# Patient Record
Sex: Female | Born: 1945
Health system: Southern US, Community
[De-identification: ages and names within clinical notes are randomized; demographics above are authoritative.]

## PROBLEM LIST (undated history)

## (undated) DIAGNOSIS — E7800 Pure hypercholesterolemia, unspecified: Secondary | ICD-10-CM

## (undated) DIAGNOSIS — B019 Varicella without complication: Secondary | ICD-10-CM

## (undated) DIAGNOSIS — T7840XA Allergy, unspecified, initial encounter: Secondary | ICD-10-CM

## (undated) DIAGNOSIS — R112 Nausea with vomiting, unspecified: Secondary | ICD-10-CM

## (undated) DIAGNOSIS — M1990 Unspecified osteoarthritis, unspecified site: Secondary | ICD-10-CM

## (undated) DIAGNOSIS — E559 Vitamin D deficiency, unspecified: Secondary | ICD-10-CM

## (undated) DIAGNOSIS — Z923 Personal history of irradiation: Secondary | ICD-10-CM

## (undated) DIAGNOSIS — Z9889 Other specified postprocedural states: Secondary | ICD-10-CM

## (undated) DIAGNOSIS — C50919 Malignant neoplasm of unspecified site of unspecified female breast: Secondary | ICD-10-CM

## (undated) DIAGNOSIS — Z9221 Personal history of antineoplastic chemotherapy: Secondary | ICD-10-CM

## (undated) HISTORY — PX: SPINE SURGERY: SHX786

## (undated) HISTORY — DX: Pure hypercholesterolemia, unspecified: E78.00

## (undated) HISTORY — PX: BACK SURGERY: SHX140

## (undated) HISTORY — DX: Malignant neoplasm of unspecified site of unspecified female breast: C50.919

## (undated) HISTORY — DX: Unspecified osteoarthritis, unspecified site: M19.90

## (undated) HISTORY — DX: Allergy, unspecified, initial encounter: T78.40XA

## (undated) HISTORY — DX: Varicella without complication: B01.9

## (undated) HISTORY — PX: BREAST SURGERY: SHX581

## (undated) HISTORY — DX: Vitamin D deficiency, unspecified: E55.9

---
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
## 1998-07-26 ENCOUNTER — Encounter: Payer: Self-pay | Admitting: Obstetrics and Gynecology

## 1998-07-26 ENCOUNTER — Ambulatory Visit (HOSPITAL_COMMUNITY): Admission: RE | Admit: 1998-07-26 | Discharge: 1998-07-26 | Payer: Self-pay | Admitting: Obstetrics and Gynecology

## 1998-11-22 ENCOUNTER — Other Ambulatory Visit: Admission: RE | Admit: 1998-11-22 | Discharge: 1998-11-22 | Payer: Self-pay | Admitting: Obstetrics and Gynecology

## 2000-01-09 ENCOUNTER — Encounter: Payer: Self-pay | Admitting: Obstetrics and Gynecology

## 2000-01-09 ENCOUNTER — Ambulatory Visit (HOSPITAL_COMMUNITY): Admission: RE | Admit: 2000-01-09 | Discharge: 2000-01-09 | Payer: Self-pay | Admitting: Obstetrics and Gynecology

## 2000-01-29 ENCOUNTER — Other Ambulatory Visit: Admission: RE | Admit: 2000-01-29 | Discharge: 2000-01-29 | Payer: Self-pay | Admitting: Obstetrics and Gynecology

## 2000-02-06 ENCOUNTER — Encounter: Payer: Self-pay | Admitting: Obstetrics and Gynecology

## 2000-02-06 ENCOUNTER — Encounter: Admission: RE | Admit: 2000-02-06 | Discharge: 2000-02-06 | Payer: Self-pay | Admitting: Obstetrics and Gynecology

## 2000-07-02 ENCOUNTER — Encounter: Payer: Self-pay | Admitting: Obstetrics and Gynecology

## 2000-07-02 ENCOUNTER — Encounter: Admission: RE | Admit: 2000-07-02 | Discharge: 2000-07-02 | Payer: Self-pay | Admitting: Obstetrics and Gynecology

## 2001-02-25 ENCOUNTER — Encounter: Admission: RE | Admit: 2001-02-25 | Discharge: 2001-02-25 | Payer: Self-pay | Admitting: Obstetrics and Gynecology

## 2001-02-25 ENCOUNTER — Encounter: Payer: Self-pay | Admitting: Obstetrics and Gynecology

## 2001-08-19 ENCOUNTER — Encounter: Admission: RE | Admit: 2001-08-19 | Discharge: 2001-08-19 | Payer: Self-pay | Admitting: *Deleted

## 2001-08-19 ENCOUNTER — Encounter: Payer: Self-pay | Admitting: *Deleted

## 2001-09-02 ENCOUNTER — Other Ambulatory Visit: Admission: RE | Admit: 2001-09-02 | Discharge: 2001-09-02 | Payer: Self-pay | Admitting: *Deleted

## 2002-03-03 ENCOUNTER — Encounter: Payer: Self-pay | Admitting: *Deleted

## 2002-03-03 ENCOUNTER — Encounter: Admission: RE | Admit: 2002-03-03 | Discharge: 2002-03-03 | Payer: Self-pay | Admitting: *Deleted

## 2002-09-22 ENCOUNTER — Other Ambulatory Visit: Admission: RE | Admit: 2002-09-22 | Discharge: 2002-09-22 | Payer: Self-pay | Admitting: *Deleted

## 2003-04-13 ENCOUNTER — Encounter: Admission: RE | Admit: 2003-04-13 | Discharge: 2003-04-13 | Payer: Self-pay | Admitting: Maternal and Fetal Medicine

## 2003-11-09 ENCOUNTER — Other Ambulatory Visit: Admission: RE | Admit: 2003-11-09 | Discharge: 2003-11-09 | Payer: Self-pay | Admitting: Gynecology

## 2003-11-11 ENCOUNTER — Encounter: Admission: RE | Admit: 2003-11-11 | Discharge: 2003-11-11 | Payer: Self-pay | Admitting: Gynecology

## 2004-08-09 ENCOUNTER — Ambulatory Visit: Payer: Self-pay | Admitting: Family Medicine

## 2005-02-06 ENCOUNTER — Encounter: Admission: RE | Admit: 2005-02-06 | Discharge: 2005-02-06 | Payer: Self-pay | Admitting: Gynecology

## 2005-02-11 ENCOUNTER — Ambulatory Visit: Payer: Self-pay | Admitting: Family Medicine

## 2005-04-08 ENCOUNTER — Other Ambulatory Visit: Admission: RE | Admit: 2005-04-08 | Discharge: 2005-04-08 | Payer: Self-pay | Admitting: Obstetrics and Gynecology

## 2005-07-23 ENCOUNTER — Encounter (INDEPENDENT_AMBULATORY_CARE_PROVIDER_SITE_OTHER): Payer: Self-pay | Admitting: *Deleted

## 2005-07-23 ENCOUNTER — Ambulatory Visit (HOSPITAL_COMMUNITY): Admission: RE | Admit: 2005-07-23 | Discharge: 2005-07-24 | Payer: Self-pay | Admitting: Obstetrics and Gynecology

## 2006-02-18 ENCOUNTER — Encounter: Admission: RE | Admit: 2006-02-18 | Discharge: 2006-02-18 | Payer: Self-pay | Admitting: Obstetrics & Gynecology

## 2006-10-24 ENCOUNTER — Ambulatory Visit: Payer: Self-pay | Admitting: Family Medicine

## 2006-10-24 DIAGNOSIS — T6391XA Toxic effect of contact with unspecified venomous animal, accidental (unintentional), initial encounter: Secondary | ICD-10-CM | POA: Insufficient documentation

## 2007-06-29 ENCOUNTER — Encounter: Admission: RE | Admit: 2007-06-29 | Discharge: 2007-06-29 | Payer: Self-pay | Admitting: Obstetrics & Gynecology

## 2008-09-08 ENCOUNTER — Encounter: Admission: RE | Admit: 2008-09-08 | Discharge: 2008-09-08 | Payer: Self-pay | Admitting: Obstetrics & Gynecology

## 2009-10-11 ENCOUNTER — Encounter: Admission: RE | Admit: 2009-10-11 | Discharge: 2009-10-11 | Payer: Self-pay | Admitting: Obstetrics & Gynecology

## 2010-04-07 ENCOUNTER — Encounter: Payer: Self-pay | Admitting: *Deleted

## 2010-08-03 NOTE — H&P (Signed)
NAME:  Patricia Marshall, CHOUDHRY                ACCOUNT NO.:  [id number]   MEDICAL RECORD NO.:  [id number]          PATIENT TYPE:  AMB   LOCATION:  SDC                           FACILITY:  [facility]   PHYSICIAN:  Juluis Mire, M.D.   DATE OF BIRTH:  Jan 10, 1946   DATE OF ADMISSION:  07/23/2005  DATE OF DISCHARGE:                                HISTORY & PHYSICAL   HISTORY OF PRESENT ILLNESS:  The patient is a 65 year old, nulligravida,  postmenopausal patient who presents for hysteroscopy.  The patient had been  menopausal and reported some increasing vaginal bleeding over the past year.  She underwent a saline infusion ultrasound that demonstrated an endometrial  polyp-like outgrowth from the endometrium.  Because of this, she is going to  proceed for hysteroscopic resection.   ALLERGIES:  AMPICILLIN.   MEDICATIONS:  Multivitamin, calcium, magnesium.   PAST MEDICAL HISTORY:  Usual childhood diseases without significant  sequelae.  She has had no previous surgical or obstetrical history.   FAMILY HISTORY:  Mother with history of stomach cancer.  Sister with history  of adenocarcinoma.  Father with history of heart attack.   SOCIAL HISTORY:  No tobacco or alcohol use.   REVIEW OF SYSTEMS:  Noncontributory.   PHYSICAL EXAMINATION:  VITAL SIGNS:  Afebrile with stable vital signs.  HEENT:  The patient is normocephalic, pupils equal round and reactive to  light, extraocular movements intact.  Sclerae and conjunctivae are clear.  Oropharynx clear.  NECK:  Without thyromegaly.  BREASTS:  No discrete masses.  LUNGS:  Clear.  CARDIAC:  Regular rate and rhythm without murmurs, rubs or gallops.  ABDOMEN:  Benign.  PELVIC:  Normal external genitalia.  Vaginal mucosa is clear.  Cervix  unremarkable.  Uterus normal shape, size and contour.  Adnexa free of masses  or tenderness.  EXTREMITIES:  Trace edema.  NEUROLOGIC:  Grossly within normal limits.   IMPRESSION:  Postmenopausal bleed with evidence of a  endometrial polyp.   PLAN:  The patient to undergo hysteroscopic evaluation with resection of  polyp.  The risks of surgery have been discussed including the risk of  infection, the risk of hemorrhage that could require transfusion with  possible risk of  AIDS or hepatitis.  This could also lead to the possibility of hysterectomy.  There is risk of injury to adjacent organs that could require exploratory  surgery and risk of deep venous thrombosis and pulmonary embolus.  The  patient understands indication of risk and options.      Juluis Mire, M.D.  Electronically Signed     JSM/MEDQ  D:  07/23/2005  T:  07/23/2005  Job:  [id number]

## 2010-08-03 NOTE — Discharge Summary (Signed)
Patricia Marshall, Patricia Marshall                ACCOUNT NO.:  [id number]   MEDICAL RECORD NO.:  [id number]          PATIENT TYPE:  OIB   LOCATION:  9147                          FACILITY:  [facility]   PHYSICIAN:  Juluis Mire, M.D.   DATE OF BIRTH:  [birth year]   DATE OF ADMISSION:  07/23/2005  DATE OF DISCHARGE:  07/24/2005                                 DISCHARGE SUMMARY   ADMISSION DIAGNOSIS:  Postmenopausal bleed and possible endometrial polyp.   DISCHARGE DIAGNOSIS:  Postmenopausal bleed and possible endometrial polyp.   PROCEDURE:  Hysteroscopy.  For complete history and physical, please see  dictated note.   HOSPITAL COURSE:  The patient underwent the hysteroscopy.  We did have a  fundal perforation.  Hysteroscopic evaluation, however, revealed no evidence  of endometrial abnormalities and the endometrium appeared to be atrophic.  We did do endometrial curetting.   In the recovery room, she has continued postoperative nausea despite  medications.  Due to this, she was admitted to be observed overnight.  This  morning, she is afebrile, stable vital signs.  Abdomen soft and nontender.  Bowel sounds are active.  White count was 9700, hemoglobin stable at 12.1.  She was feeling much better and tolerating a diet.  Her IVs will be  discontinued.  She will be discharged home for management of postoperative  nausea.   In terms of complication, _____________.  The patient returned in stable  condition.   DISPOSITION:  Discharge on Darvocet that she needs for pain.  She will call  if any active heavy bleeding, nausea, vomiting or increasing fever.  Follow  up in the office will be in one week.      Juluis Mire, M.D.  Electronically Signed     JSM/MEDQ  D:  07/24/2005  T:  07/24/2005  Job:  [id number]

## 2010-08-03 NOTE — Op Note (Signed)
Patricia Marshall, Patricia Marshall                ACCOUNT NO.:  [id number]   MEDICAL RECORD NO.:  [id number]          PATIENT TYPE:  OIB   LOCATION:  9147                          [facility]ILITY:  [facility]   PHYSICIAN:  Juluis Mire, M.D.   DATE OF BIRTH:  28-Dec-1945   DATE OF PROCEDURE:  07/23/2005  DATE OF DISCHARGE:                                 OPERATIVE REPORT   PREOPERATIVE DIAGNOSIS:  Postmenopausal bleeding, possible endometrial  polyp.   POSTOPERATIVE DIAGNOSIS:  Postmenopausal bleeding, possible endometrial  polyp.   OPERATIVE PROCEDURE:  Hysteroscopy with dilation and curettage.   SURGEON:  Juluis Mire, M.D.   ANESTHESIA:  Sedation with paracervical block.   ESTIMATED BLOOD LOSS:  Minimal.   PACKS/DRAINS:  None.   INTRAOPERATIVE BLOOD REPLACED:  None.   COMPLICATIONS:  Fundal perforation.   PROCEDURE:  Patient was taken to the OR and placed in supine position.  After satisfactory level of sedation, the patient was placed in the dorsal  lithotomy position in __________stirrups.  The patient was draped in a  sterile field.  The speculum was placed in the vaginal vault.  The cervix  and vagina were cleansed out with Betadine.  A paracervical block was  instituted using 1% Nesacaine.  The cervix was secured with a single-tooth  tenaculum.  During uterine sounding, we felt like we had a fundal  perforation; therefore, total cavity length was undetermined.  We  superficially dilated the cervix to a size 33 Pratt dilator.  We were able  to insert the operative hysteroscope.  You could see a fundal perforation;  however, we were definitely in the endometrial cavity.  We visualized  carefully.  There was no evidence of any endometrial polyp or abnormalities.  The endometrium actually looked atrophic.  We carefully did endometrial  curettings, avoiding the perforation site.  Revisualization with the  hysteroscope revealed no complications or active bleeding.  At this point in  time, the  speculum and single-tooth tenaculum removed.  The patient was  taken out of the dorsal supine position.  Once alert, transferred to the  recovery room in good condition.  She will be discharged home if stable in  recovery room.  Followup in the office will be in a week.   Sponge, instrument, needle count were reported as correct by the circulating  nurse x2.   Patient tolerated the procedure well and was returned to the recovery room  in good condition.      Juluis Mire, M.D.  Electronically Signed     JSM/MEDQ  D:  07/23/2005  T:  07/24/2005  Job:  [id number]

## 2011-03-29 ENCOUNTER — Other Ambulatory Visit: Payer: Self-pay | Admitting: Obstetrics & Gynecology

## 2011-03-29 DIAGNOSIS — Z1231 Encounter for screening mammogram for malignant neoplasm of breast: Secondary | ICD-10-CM

## 2011-04-09 ENCOUNTER — Ambulatory Visit
Admission: RE | Admit: 2011-04-09 | Discharge: 2011-04-09 | Disposition: A | Discharge status: Home / Self Care | Payer: BC Managed Care – PPO | Source: Ambulatory Visit | Attending: Obstetrics & Gynecology | Admitting: Obstetrics & Gynecology

## 2011-04-09 DIAGNOSIS — Z1231 Encounter for screening mammogram for malignant neoplasm of breast: Secondary | ICD-10-CM

## 2012-09-10 ENCOUNTER — Other Ambulatory Visit: Payer: Self-pay | Admitting: Obstetrics & Gynecology

## 2012-09-10 DIAGNOSIS — Z780 Asymptomatic menopausal state: Secondary | ICD-10-CM

## 2012-09-10 DIAGNOSIS — Z1231 Encounter for screening mammogram for malignant neoplasm of breast: Secondary | ICD-10-CM

## 2012-09-10 DIAGNOSIS — E2839 Other primary ovarian failure: Secondary | ICD-10-CM

## 2012-10-29 ENCOUNTER — Ambulatory Visit
Admission: RE | Admit: 2012-10-29 | Discharge: 2012-10-29 | Disposition: A | Discharge status: Home / Self Care | Payer: Medicare Other | Source: Ambulatory Visit | Attending: Obstetrics & Gynecology | Admitting: Obstetrics & Gynecology

## 2012-10-29 DIAGNOSIS — E2839 Other primary ovarian failure: Secondary | ICD-10-CM

## 2012-12-24 ENCOUNTER — Ambulatory Visit: Payer: BC Managed Care – PPO

## 2015-07-12 ENCOUNTER — Ambulatory Visit (INDEPENDENT_AMBULATORY_CARE_PROVIDER_SITE_OTHER)
Admission: RE | Admit: 2015-07-12 | Discharge: 2015-07-12 | Disposition: A | Discharge status: Home / Self Care | Payer: Medicare HMO | Source: Ambulatory Visit | Attending: Family Medicine | Admitting: Family Medicine

## 2015-07-12 ENCOUNTER — Other Ambulatory Visit (INDEPENDENT_AMBULATORY_CARE_PROVIDER_SITE_OTHER): Payer: Medicare HMO

## 2015-07-12 ENCOUNTER — Ambulatory Visit (INDEPENDENT_AMBULATORY_CARE_PROVIDER_SITE_OTHER): Payer: Medicare HMO | Admitting: Family Medicine

## 2015-07-12 ENCOUNTER — Encounter: Payer: Self-pay | Admitting: Family Medicine

## 2015-07-12 VITALS — BP 130/76 | HR 64 | Ht 66.0 in | Wt 127.0 lb

## 2015-07-12 DIAGNOSIS — M1712 Unilateral primary osteoarthritis, left knee: Secondary | ICD-10-CM

## 2015-07-12 DIAGNOSIS — M25562 Pain in left knee: Secondary | ICD-10-CM | POA: Diagnosis not present

## 2015-07-12 DIAGNOSIS — M5416 Radiculopathy, lumbar region: Secondary | ICD-10-CM | POA: Diagnosis not present

## 2015-07-12 DIAGNOSIS — G5702 Lesion of sciatic nerve, left lower limb: Secondary | ICD-10-CM

## 2015-07-12 DIAGNOSIS — M47816 Spondylosis without myelopathy or radiculopathy, lumbar region: Secondary | ICD-10-CM | POA: Diagnosis not present

## 2015-07-12 NOTE — Assessment & Plan Note (Signed)
Test and some degenerative left knee changes on ultrasound today. I do not think that this is likely contributing to the pain. We will continue to monitor. If worsening symptoms we'll consider other treatment options.

## 2015-07-12 NOTE — Progress Notes (Signed)
Pre visit review using our clinic review tool, if applicable. No additional management support is needed unless otherwise documented below in the visit note. 

## 2015-07-12 NOTE — Patient Instructions (Signed)
Good to see you.  Xray downstairs today  Ice 20 minutes 2 times daily. Usually after activity and before bed. Exercises 3 times a week.  Tennis ball in back left pocket with exercise Duexis 3 times a day for 3 days. Stop if it hurts your stomach Stay active Wear good shoes See me again in 3-4 weeks.

## 2015-07-12 NOTE — Assessment & Plan Note (Signed)
I believe the patient does have more of a piriformis syndrome on the left side. Severely tender to palpation in the area. Likely having the nerve impingement when it does seem to be aggravated causing the lower pain. Patient does have a history of lumbar surgery in 21 years ago. I would like to get x-rays. We will make sure that there is known severe L5-S1 degenerative disc disease and also be contribute in. Negative straight leg test noted today. Patient work with Product/process development scientist to learn home exercises. We discussed manual massage, patient work on the home exercises, icing and patient declined any medications today. Follow up in 4 weeks to ensure patient is doing well.

## 2015-07-12 NOTE — Progress Notes (Signed)
[facility] [address], [place] [address] Phone: [phone] Subjective:     CC: left leg pain  [id number] Patricia Marshall is a 70 y.o. female coming in with complaint of left leg pain. Patient is had this pain intermittently for quite some time. Patient states that it seems to be coming more frequent. Seems to be more of a burning sensation on the anterior lateral aspect just below her knee. Sometimes she has noticed though it seems to be associated with the buttocks pain as well. Worse if she is been sitting more, better when she does activity. Denies any weakness. Minimal back pain that seems to be associated with it. Rates the severity of pain as 5 out of 10.     No past medical history on file. No past surgical history on file.history of herniated disc at L4-L5 on the right side Social History   Social History  . Marital Status: Married    Spouse Name: N/A  . Number of Children: N/A  . Years of Education: N/A   Social History Main Topics  . Smoking status: Never Smoker   . Smokeless tobacco: None  . Alcohol Use: None  . Drug Use: None  . Sexual Activity: Not Asked   Other Topics Concern  . None   Social History Narrative  . None   Allergies  Allergen Reactions  . Ampicillin     REACTION: "drug fever"   No family history on file.no family history of rheumatological diseases  Past medical history, social, surgical and family history all reviewed in electronic medical record.  No pertanent information unless stated regarding to the chief complaint.   Review of Systems: No headache, visual changes, nausea, vomiting, diarrhea, constipation, dizziness, abdominal pain, skin rash, fevers, chills, night sweats, weight loss, swollen lymph nodes, body aches, joint swelling, muscle aches, chest pain, shortness of breath, mood changes.   Objective Blood pressure 130/76, pulse 64, height 5\' 6"  (1.676 m), weight 127 lb (57.607 kg),  SpO2 97 %.  General: No apparent distress alert and oriented x3 mood and affect normal, dressed appropriately.  HEENT: Pupils equal, extraocular movements intact  Respiratory: Patient's speak in full sentences and does not appear short of breath  Cardiovascular: No lower extremity edema, non tender, no erythema  Skin: Warm dry intact with no signs of infection or rash on extremities or on axial skeleton.  Abdomen: Soft nontender  Neuro: Cranial nerves II through XII are intact, neurovascularly intact in all extremities with 2+ DTRs and 2+ pulses.  Lymph: No lymphadenopathy of posterior or anterior cervical chain or axillae bilaterally.  Gait normal with good balance and coordination.  MSK:  Non tender with full range of motion and good stability and symmetric strength and tone of shoulders, elbows, wrist, hip, knee and ankles bilaterally.  Back Exam:  Inspection: Unremarkable  Motion: Flexion 35 deg, Extension 25 deg, Side Bending to 35 deg bilaterally,  Rotation to 35 deg bilaterally  SLR laying: Negative  XSLR laying: Negative  Palpable tenderness: tender over the piriformis on the left side FABER: mild positive left Sensory change: Gross sensation intact to all lumbar and sacral dermatomes.  Reflexes: 2+ at both patellar tendons, 2+ at achilles tendons, Babinski's downgoing.  Strength at foot  Plantar-flexion: 5/5 Dorsi-flexion: 5/5 Eversion: 5/5 Inversion: 5/5  Leg strength  Quad: 5/5 Hamstring: 5/5 Hip flexor: 5/5 Hip abductors: 4/5  Gait unremarkable. Patient's knee exam is unremarkable. No tenderness.  MSK US  performed of: left knee This study was ordered, performed, and interpreted by Charlann Boxer D.O.  Knee: All structures visualized. Moderate narrowing of the lateral and medial joint space Patellar Tendon unremarkable on long and transverse views without effusion. No abnormality of prepatellar bursa. LCL and MCL unremarkable on long and transverse views. No abnormality of  origin of medial or lateral head of the gastrocnemius.  IMPRESSION:  Mild to moderate osteophytic changes  Procedure note D000499; 15 minutes spent for Therapeutic exercises as stated in above notes.  This included exercises focusing on stretching, strengthening, with significant focus on eccentric aspects.  Piriformis Syndrome  Using an anatomical model, reviewed with the patient the structures involved and how they related to diagnosis. The patient indicated understanding.   The patient was given a handout from Dr. Arne Cleveland book "The Sports Medicine Patient Advisor" describing the anatomy and rehabilitation of the following condition: Piriformis Syndrome  Also given a handout with more extensive Piriformis stretching, hip flexor and abductor strengthening, ham stretching  Rec deep massage, explained self-massage with ball  Proper technique shown and discussed handout in great detail with ATC.  All questions were discussed and answered.     Impression and Recommendations:     This case required medical decision making of moderate complexity.      Note: This dictation was prepared with Dragon dictation along with smaller phrase technology. Any transcriptional errors that result from this process are unintentional.

## 2015-08-10 ENCOUNTER — Encounter: Payer: Self-pay | Admitting: Family Medicine

## 2015-08-10 ENCOUNTER — Ambulatory Visit (INDEPENDENT_AMBULATORY_CARE_PROVIDER_SITE_OTHER): Payer: Medicare HMO | Admitting: Family Medicine

## 2015-08-10 VITALS — BP 128/70 | HR 65 | Ht 66.0 in | Wt 127.0 lb

## 2015-08-10 DIAGNOSIS — M1712 Unilateral primary osteoarthritis, left knee: Secondary | ICD-10-CM | POA: Diagnosis not present

## 2015-08-10 DIAGNOSIS — G5702 Lesion of sciatic nerve, left lower limb: Secondary | ICD-10-CM

## 2015-08-10 NOTE — Progress Notes (Signed)
[facility] [address] [place], [place] [address] Phone: [phone] Subjective:     CC: left leg pain f/u  [id number] Patricia Marshall is a 70 y.o. female coming in with complaint of left leg pain. Plan I have more of a degenerative disc disease of the lumbar spine. Patient is due for another x-ray in 2 months time to further evaluate for possible mass. It is a low likelihood and was more of a shadow. Patient also has piriformis syndrome. Has been doing exercises. Working on core strength and hip abductor's. Patient states doing much better. Patient states that she has had the pain was completely resolved. Has been even traveling and doing very well.  Patient did have x-rays previously and there was a possible mass that was read on x-ray. I did not agree with this. Discussed with patient about repeating x-rays which patient declined.  Patient was also found to have degenerative left knee arthritis. Patient's x-rays that were independently visualized by me shows mild to moderate in severity. Patient was to do different exercises and topical anti-inflammatory's. Patient states overall seems to be doing relatively well and until nighttime. States there is also burning sensation and has some loss of motion. By the time she wakes up in the morning she seems to be doing relatively well. Denies any locking or giving out on her. Does not stop her from any activities.     No past medical history on file. No past surgical history on file.history of herniated disc at L4-L5 on the right side Social History   Social History  . Marital Status: Married    Spouse Name: N/A  . Number of Children: N/A  . Years of Education: N/A   Social History Main Topics  . Smoking status: Never Smoker   . Smokeless tobacco: None  . Alcohol Use: None  . Drug Use: None  . Sexual Activity: Not Asked   Other Topics Concern  . None   Social History Narrative   Allergies    Allergen Reactions  . Ampicillin     REACTION: "drug fever"   No family history on file.no family history of rheumatological diseases  Past medical history, social, surgical and family history all reviewed in electronic medical record.  No pertanent information unless stated regarding to the chief complaint.   Review of Systems: No headache, visual changes, nausea, vomiting, diarrhea, constipation, dizziness, abdominal pain, skin rash, fevers, chills, night sweats, weight loss, swollen lymph nodes, body aches, joint swelling, muscle aches, chest pain, shortness of breath, mood changes.   Objective Blood pressure 128/70, pulse 65, height 5\' 6"  (1.676 m), weight 127 lb (57.607 kg), SpO2 99 %.  General: No apparent distress alert and oriented x3 mood and affect normal, dressed appropriately.  HEENT: Pupils equal, extraocular movements intact  Respiratory: Patient's speak in full sentences and does not appear short of breath  Cardiovascular: No lower extremity edema, non tender, no erythema  Skin: Warm dry intact with no signs of infection or rash on extremities or on axial skeleton.  Abdomen: Soft nontender  Neuro: Cranial nerves II through XII are intact, neurovascularly intact in all extremities with 2+ DTRs and 2+ pulses.  Lymph: No lymphadenopathy of posterior or anterior cervical chain or axillae bilaterally.  Gait normal with good balance and coordination.  MSK:  Non tender with full range of motion and good stability and symmetric strength and tone of shoulders, elbows, wrist, hip, knee and ankles bilaterally.  Back Exam:  Inspection: Unremarkable  Motion: Flexion 35 deg, Extension 25 deg, Side Bending to 35 deg bilaterally,  Rotation to 35 deg bilaterally  SLR laying: Negative  XSLR laying: Negative  Palpable tenderness: nontender today FABER: mild positive leftstill remaining Sensory change: Gross sensation intact to all lumbar and sacral dermatomes.  Reflexes: 2+ at both  patellar tendons, 2+ at achilles tendons, Babinski's downgoing.  Strength at foot  Plantar-flexion: 5/5 Dorsi-flexion: 5/5 Eversion: 5/5 Inversion: 5/5  Leg strength  Quad: 5/5 Hamstring: 5/5 Hip flexor: 5/5 Hip abductors: 4/5  Gait unremarkable.  Left knee does show very trace effusion noted that is new from previous exam. Minimal tenderness of the lateral joint line. Full range of motion. Neurovascular intact with no instability.     Impression and Recommendations:     This case required medical decision making of moderate complexity.      Note: This dictation was prepared with Dragon dictation along with smaller phrase technology. Any transcriptional errors that result from this process are unintentional.

## 2015-08-10 NOTE — Assessment & Plan Note (Signed)
Doing very well at this time. Encourage patient continued the home exercises 2 times a week. Discussed continuing stay active. Patient's vitamin supplementations seem to be helping. Follow-up again in 2 months.

## 2015-08-10 NOTE — Patient Instructions (Signed)
Good to see you  Patricia Marshall is your friend especially at night Good shoes with rigid bottom.  Jalene Mullet, [employer] or New balance greater then 700 [employer] orthotics "total support" online would be great  For the knee also try a compression sleeve.  Can get it at [email or web address] or [email or web address].  You would be a size small I am pretty sure.  Keep doing piriformis exercises 32-3 times a week See me again in in 2 months and we will discuss repeating the back xray as well.  I know you do not want to.

## 2015-08-10 NOTE — Progress Notes (Signed)
Pre visit review using our clinic review tool, if applicable. No additional management support is needed unless otherwise documented below in the visit note. 

## 2015-08-10 NOTE — Assessment & Plan Note (Signed)
Patient does have a small effusion. We did discuss about possible aspiration which patient declined. Discuss possible imaging which patient declined. Discussed possible formal physical therapy which patient declined. Discussed possible compression sleeve. Patient would like to try this regimen for's. Patient and will come back and see me again in 2 months We also discussed with patient about proper shoes, we discussed orthotic, answered many questions.  Spent  25 minutes with patient face-to-face and had greater than 50% of counseling including as described above in assessment and plan.

## 2015-12-15 DIAGNOSIS — R69 Illness, unspecified: Secondary | ICD-10-CM | POA: Diagnosis not present

## 2016-02-19 DIAGNOSIS — M25552 Pain in left hip: Secondary | ICD-10-CM | POA: Diagnosis not present

## 2016-02-19 DIAGNOSIS — M79605 Pain in left leg: Secondary | ICD-10-CM | POA: Diagnosis not present

## 2016-02-19 DIAGNOSIS — G8929 Other chronic pain: Secondary | ICD-10-CM | POA: Diagnosis not present

## 2016-02-22 ENCOUNTER — Other Ambulatory Visit: Payer: Self-pay | Admitting: Sports Medicine

## 2016-02-22 DIAGNOSIS — M79605 Pain in left leg: Secondary | ICD-10-CM

## 2016-02-23 DIAGNOSIS — H25813 Combined forms of age-related cataract, bilateral: Secondary | ICD-10-CM | POA: Diagnosis not present

## 2016-02-23 DIAGNOSIS — Z01812 Encounter for preprocedural laboratory examination: Secondary | ICD-10-CM | POA: Diagnosis not present

## 2016-02-23 DIAGNOSIS — H52223 Regular astigmatism, bilateral: Secondary | ICD-10-CM | POA: Diagnosis not present

## 2016-02-23 DIAGNOSIS — H5203 Hypermetropia, bilateral: Secondary | ICD-10-CM | POA: Diagnosis not present

## 2016-02-23 DIAGNOSIS — H524 Presbyopia: Secondary | ICD-10-CM | POA: Diagnosis not present

## 2016-02-27 ENCOUNTER — Ambulatory Visit
Admission: RE | Admit: 2016-02-27 | Discharge: 2016-02-27 | Disposition: A | Discharge status: Home / Self Care | Payer: Medicare HMO | Source: Ambulatory Visit | Attending: Sports Medicine | Admitting: Sports Medicine

## 2016-02-27 DIAGNOSIS — M5126 Other intervertebral disc displacement, lumbar region: Secondary | ICD-10-CM | POA: Diagnosis not present

## 2016-02-27 DIAGNOSIS — M79605 Pain in left leg: Secondary | ICD-10-CM

## 2016-02-27 MED ORDER — GADOBENATE DIMEGLUMINE 529 MG/ML IV SOLN
10.0000 mL | Freq: Once | INTRAVENOUS | Status: AC | PRN
  Administered 2016-02-27: 10 mL via INTRAVENOUS

## 2016-02-29 DIAGNOSIS — M25552 Pain in left hip: Secondary | ICD-10-CM | POA: Diagnosis not present

## 2016-02-29 DIAGNOSIS — G8929 Other chronic pain: Secondary | ICD-10-CM | POA: Diagnosis not present

## 2016-02-29 DIAGNOSIS — M79605 Pain in left leg: Secondary | ICD-10-CM | POA: Diagnosis not present

## 2016-03-07 DIAGNOSIS — M79605 Pain in left leg: Secondary | ICD-10-CM | POA: Diagnosis not present

## 2016-03-14 DIAGNOSIS — M79605 Pain in left leg: Secondary | ICD-10-CM | POA: Diagnosis not present

## 2016-03-19 DIAGNOSIS — G8929 Other chronic pain: Secondary | ICD-10-CM | POA: Diagnosis not present

## 2016-03-19 DIAGNOSIS — M25552 Pain in left hip: Secondary | ICD-10-CM | POA: Diagnosis not present

## 2016-03-22 DIAGNOSIS — M79605 Pain in left leg: Secondary | ICD-10-CM | POA: Diagnosis not present

## 2016-04-09 DIAGNOSIS — M79605 Pain in left leg: Secondary | ICD-10-CM | POA: Diagnosis not present

## 2016-04-09 DIAGNOSIS — G8929 Other chronic pain: Secondary | ICD-10-CM | POA: Diagnosis not present

## 2016-04-09 DIAGNOSIS — M25552 Pain in left hip: Secondary | ICD-10-CM | POA: Diagnosis not present

## 2016-04-18 DIAGNOSIS — M79605 Pain in left leg: Secondary | ICD-10-CM | POA: Diagnosis not present

## 2016-05-30 DIAGNOSIS — M25552 Pain in left hip: Secondary | ICD-10-CM | POA: Diagnosis not present

## 2016-05-30 DIAGNOSIS — G8929 Other chronic pain: Secondary | ICD-10-CM | POA: Diagnosis not present

## 2016-05-30 DIAGNOSIS — M79605 Pain in left leg: Secondary | ICD-10-CM | POA: Diagnosis not present

## 2016-06-24 DIAGNOSIS — R69 Illness, unspecified: Secondary | ICD-10-CM | POA: Diagnosis not present

## 2016-10-25 DIAGNOSIS — H539 Unspecified visual disturbance: Secondary | ICD-10-CM | POA: Diagnosis not present

## 2016-12-30 DIAGNOSIS — R69 Illness, unspecified: Secondary | ICD-10-CM | POA: Diagnosis not present

## 2017-03-12 DIAGNOSIS — H52223 Regular astigmatism, bilateral: Secondary | ICD-10-CM | POA: Diagnosis not present

## 2017-03-12 DIAGNOSIS — H25813 Combined forms of age-related cataract, bilateral: Secondary | ICD-10-CM | POA: Diagnosis not present

## 2017-03-12 DIAGNOSIS — H40053 Ocular hypertension, bilateral: Secondary | ICD-10-CM | POA: Diagnosis not present

## 2017-03-12 DIAGNOSIS — H5203 Hypermetropia, bilateral: Secondary | ICD-10-CM | POA: Diagnosis not present

## 2017-03-24 DIAGNOSIS — Z01419 Encounter for gynecological examination (general) (routine) without abnormal findings: Secondary | ICD-10-CM | POA: Diagnosis not present

## 2017-04-04 DIAGNOSIS — Z0100 Encounter for examination of eyes and vision without abnormal findings: Secondary | ICD-10-CM | POA: Diagnosis not present

## 2017-04-14 DIAGNOSIS — R69 Illness, unspecified: Secondary | ICD-10-CM | POA: Diagnosis not present

## 2017-06-13 DIAGNOSIS — A084 Viral intestinal infection, unspecified: Secondary | ICD-10-CM | POA: Diagnosis not present

## 2017-07-14 DIAGNOSIS — R69 Illness, unspecified: Secondary | ICD-10-CM | POA: Diagnosis not present

## 2017-07-16 DIAGNOSIS — E785 Hyperlipidemia, unspecified: Secondary | ICD-10-CM | POA: Diagnosis not present

## 2017-07-16 DIAGNOSIS — E559 Vitamin D deficiency, unspecified: Secondary | ICD-10-CM | POA: Diagnosis not present

## 2017-07-16 DIAGNOSIS — M7632 Iliotibial band syndrome, left leg: Secondary | ICD-10-CM | POA: Diagnosis not present

## 2017-07-16 DIAGNOSIS — M25562 Pain in left knee: Secondary | ICD-10-CM | POA: Diagnosis not present

## 2017-08-04 DIAGNOSIS — D126 Benign neoplasm of colon, unspecified: Secondary | ICD-10-CM | POA: Diagnosis not present

## 2017-08-04 DIAGNOSIS — Z1211 Encounter for screening for malignant neoplasm of colon: Secondary | ICD-10-CM | POA: Diagnosis not present

## 2017-08-04 DIAGNOSIS — K635 Polyp of colon: Secondary | ICD-10-CM | POA: Diagnosis not present

## 2017-08-04 LAB — HM COLONOSCOPY

## 2017-08-06 DIAGNOSIS — K635 Polyp of colon: Secondary | ICD-10-CM | POA: Diagnosis not present

## 2017-08-06 DIAGNOSIS — D126 Benign neoplasm of colon, unspecified: Secondary | ICD-10-CM | POA: Diagnosis not present

## 2017-09-23 ENCOUNTER — Other Ambulatory Visit: Payer: Self-pay | Admitting: Obstetrics & Gynecology

## 2017-09-23 DIAGNOSIS — Z1231 Encounter for screening mammogram for malignant neoplasm of breast: Secondary | ICD-10-CM

## 2017-10-22 ENCOUNTER — Ambulatory Visit
Admission: RE | Admit: 2017-10-22 | Discharge: 2017-10-22 | Disposition: A | Discharge status: Home / Self Care | Payer: Medicare HMO | Source: Ambulatory Visit | Attending: Obstetrics & Gynecology | Admitting: Obstetrics & Gynecology

## 2017-10-22 DIAGNOSIS — Z1231 Encounter for screening mammogram for malignant neoplasm of breast: Secondary | ICD-10-CM | POA: Diagnosis not present

## 2017-11-13 DIAGNOSIS — E559 Vitamin D deficiency, unspecified: Secondary | ICD-10-CM | POA: Diagnosis not present

## 2017-11-13 DIAGNOSIS — Z6820 Body mass index (BMI) 20.0-20.9, adult: Secondary | ICD-10-CM | POA: Diagnosis not present

## 2017-11-13 DIAGNOSIS — E785 Hyperlipidemia, unspecified: Secondary | ICD-10-CM | POA: Diagnosis not present

## 2017-11-13 DIAGNOSIS — M7632 Iliotibial band syndrome, left leg: Secondary | ICD-10-CM | POA: Diagnosis not present

## 2017-11-13 DIAGNOSIS — Z86010 Personal history of colonic polyps: Secondary | ICD-10-CM | POA: Diagnosis not present

## 2017-11-18 DIAGNOSIS — M7632 Iliotibial band syndrome, left leg: Secondary | ICD-10-CM | POA: Diagnosis not present

## 2017-11-18 DIAGNOSIS — M25552 Pain in left hip: Secondary | ICD-10-CM | POA: Diagnosis not present

## 2017-11-18 DIAGNOSIS — M25562 Pain in left knee: Secondary | ICD-10-CM | POA: Diagnosis not present

## 2017-11-24 DIAGNOSIS — M25552 Pain in left hip: Secondary | ICD-10-CM | POA: Diagnosis not present

## 2017-11-24 DIAGNOSIS — M25562 Pain in left knee: Secondary | ICD-10-CM | POA: Diagnosis not present

## 2017-11-24 DIAGNOSIS — M7632 Iliotibial band syndrome, left leg: Secondary | ICD-10-CM | POA: Diagnosis not present

## 2017-12-01 DIAGNOSIS — M7632 Iliotibial band syndrome, left leg: Secondary | ICD-10-CM | POA: Diagnosis not present

## 2017-12-01 DIAGNOSIS — M25562 Pain in left knee: Secondary | ICD-10-CM | POA: Diagnosis not present

## 2017-12-01 DIAGNOSIS — M25552 Pain in left hip: Secondary | ICD-10-CM | POA: Diagnosis not present

## 2017-12-08 DIAGNOSIS — M25562 Pain in left knee: Secondary | ICD-10-CM | POA: Diagnosis not present

## 2017-12-08 DIAGNOSIS — M7632 Iliotibial band syndrome, left leg: Secondary | ICD-10-CM | POA: Diagnosis not present

## 2017-12-08 DIAGNOSIS — M25552 Pain in left hip: Secondary | ICD-10-CM | POA: Diagnosis not present

## 2017-12-17 DIAGNOSIS — M7632 Iliotibial band syndrome, left leg: Secondary | ICD-10-CM | POA: Diagnosis not present

## 2017-12-17 DIAGNOSIS — M25552 Pain in left hip: Secondary | ICD-10-CM | POA: Diagnosis not present

## 2017-12-17 DIAGNOSIS — M25562 Pain in left knee: Secondary | ICD-10-CM | POA: Diagnosis not present

## 2017-12-24 DIAGNOSIS — R319 Hematuria, unspecified: Secondary | ICD-10-CM | POA: Diagnosis not present

## 2017-12-24 DIAGNOSIS — N390 Urinary tract infection, site not specified: Secondary | ICD-10-CM | POA: Diagnosis not present

## 2018-01-01 DIAGNOSIS — M25562 Pain in left knee: Secondary | ICD-10-CM | POA: Diagnosis not present

## 2018-01-01 DIAGNOSIS — M25552 Pain in left hip: Secondary | ICD-10-CM | POA: Diagnosis not present

## 2018-01-01 DIAGNOSIS — M7632 Iliotibial band syndrome, left leg: Secondary | ICD-10-CM | POA: Diagnosis not present

## 2018-01-15 DIAGNOSIS — M25562 Pain in left knee: Secondary | ICD-10-CM | POA: Diagnosis not present

## 2018-01-15 DIAGNOSIS — M7632 Iliotibial band syndrome, left leg: Secondary | ICD-10-CM | POA: Diagnosis not present

## 2018-01-15 DIAGNOSIS — M25552 Pain in left hip: Secondary | ICD-10-CM | POA: Diagnosis not present

## 2018-01-28 DIAGNOSIS — R69 Illness, unspecified: Secondary | ICD-10-CM | POA: Diagnosis not present

## 2018-02-02 DIAGNOSIS — M25552 Pain in left hip: Secondary | ICD-10-CM | POA: Diagnosis not present

## 2018-02-02 DIAGNOSIS — M25562 Pain in left knee: Secondary | ICD-10-CM | POA: Diagnosis not present

## 2018-02-02 DIAGNOSIS — M7632 Iliotibial band syndrome, left leg: Secondary | ICD-10-CM | POA: Diagnosis not present

## 2018-02-10 DIAGNOSIS — M7632 Iliotibial band syndrome, left leg: Secondary | ICD-10-CM | POA: Diagnosis not present

## 2018-02-10 DIAGNOSIS — M25562 Pain in left knee: Secondary | ICD-10-CM | POA: Diagnosis not present

## 2018-02-10 DIAGNOSIS — M25552 Pain in left hip: Secondary | ICD-10-CM | POA: Diagnosis not present

## 2018-02-23 DIAGNOSIS — M7632 Iliotibial band syndrome, left leg: Secondary | ICD-10-CM | POA: Diagnosis not present

## 2018-02-23 DIAGNOSIS — M25562 Pain in left knee: Secondary | ICD-10-CM | POA: Diagnosis not present

## 2018-02-23 DIAGNOSIS — M25552 Pain in left hip: Secondary | ICD-10-CM | POA: Diagnosis not present

## 2018-03-05 DIAGNOSIS — M25552 Pain in left hip: Secondary | ICD-10-CM | POA: Diagnosis not present

## 2018-03-05 DIAGNOSIS — M25562 Pain in left knee: Secondary | ICD-10-CM | POA: Diagnosis not present

## 2018-03-05 DIAGNOSIS — M7632 Iliotibial band syndrome, left leg: Secondary | ICD-10-CM | POA: Diagnosis not present

## 2018-04-01 DIAGNOSIS — H5203 Hypermetropia, bilateral: Secondary | ICD-10-CM | POA: Diagnosis not present

## 2018-04-01 DIAGNOSIS — H52223 Regular astigmatism, bilateral: Secondary | ICD-10-CM | POA: Diagnosis not present

## 2018-04-08 ENCOUNTER — Other Ambulatory Visit: Payer: Self-pay | Admitting: Obstetrics & Gynecology

## 2018-04-08 DIAGNOSIS — Z780 Asymptomatic menopausal state: Secondary | ICD-10-CM

## 2018-04-08 DIAGNOSIS — E2839 Other primary ovarian failure: Secondary | ICD-10-CM

## 2018-06-10 ENCOUNTER — Other Ambulatory Visit: Payer: Medicare HMO

## 2018-08-12 ENCOUNTER — Other Ambulatory Visit: Payer: Medicare HMO

## 2018-08-21 DIAGNOSIS — E785 Hyperlipidemia, unspecified: Secondary | ICD-10-CM | POA: Diagnosis not present

## 2018-08-21 DIAGNOSIS — Z131 Encounter for screening for diabetes mellitus: Secondary | ICD-10-CM | POA: Diagnosis not present

## 2018-08-21 DIAGNOSIS — E559 Vitamin D deficiency, unspecified: Secondary | ICD-10-CM | POA: Diagnosis not present

## 2018-08-21 DIAGNOSIS — Z1159 Encounter for screening for other viral diseases: Secondary | ICD-10-CM | POA: Diagnosis not present

## 2018-08-21 DIAGNOSIS — Z6820 Body mass index (BMI) 20.0-20.9, adult: Secondary | ICD-10-CM | POA: Diagnosis not present

## 2018-08-21 DIAGNOSIS — Z0000 Encounter for general adult medical examination without abnormal findings: Secondary | ICD-10-CM | POA: Diagnosis not present

## 2018-08-31 ENCOUNTER — Other Ambulatory Visit: Payer: Self-pay

## 2018-08-31 ENCOUNTER — Ambulatory Visit: Payer: Self-pay | Admitting: *Deleted

## 2018-08-31 ENCOUNTER — Emergency Department (HOSPITAL_COMMUNITY)
Admission: EM | Admit: 2018-08-31 | Discharge: 2018-08-31 | Disposition: A | Discharge status: Home / Self Care | Payer: Medicare HMO | Attending: Emergency Medicine | Admitting: Emergency Medicine

## 2018-08-31 ENCOUNTER — Encounter (HOSPITAL_COMMUNITY): Payer: Self-pay

## 2018-08-31 DIAGNOSIS — Z20828 Contact with and (suspected) exposure to other viral communicable diseases: Secondary | ICD-10-CM | POA: Diagnosis not present

## 2018-08-31 DIAGNOSIS — R55 Syncope and collapse: Secondary | ICD-10-CM

## 2018-08-31 DIAGNOSIS — R509 Fever, unspecified: Secondary | ICD-10-CM | POA: Diagnosis not present

## 2018-08-31 DIAGNOSIS — R42 Dizziness and giddiness: Secondary | ICD-10-CM | POA: Diagnosis not present

## 2018-08-31 LAB — URINALYSIS, ROUTINE W REFLEX MICROSCOPIC
Bacteria, UA: NONE SEEN
Bilirubin Urine: NEGATIVE
Glucose, UA: NEGATIVE mg/dL
Hgb urine dipstick: NEGATIVE
Ketones, ur: 80 mg/dL — AB
Leukocytes,Ua: NEGATIVE
Nitrite: NEGATIVE
Protein, ur: 30 mg/dL — AB
Specific Gravity, Urine: 1.02 (ref 1.005–1.030)
pH: 6 (ref 5.0–8.0)

## 2018-08-31 LAB — BASIC METABOLIC PANEL
Anion gap: 11 (ref 5–15)
BUN: 15 mg/dL (ref 8–23)
CO2: 25 mmol/L (ref 22–32)
Calcium: 9.6 mg/dL (ref 8.9–10.3)
Chloride: 98 mmol/L (ref 98–111)
Creatinine, Ser: 0.69 mg/dL (ref 0.44–1.00)
GFR calc Af Amer: >60 mL/min (ref >60)
GFR calc non Af Amer: >60 mL/min (ref >60)
Glucose, Bld: 102 mg/dL — ABNORMAL HIGH (ref 70–99)
Potassium: 3.4 mmol/L — ABNORMAL LOW (ref 3.5–5.1)
Sodium: 134 mmol/L — ABNORMAL LOW (ref 135–145)

## 2018-08-31 LAB — CBC
HCT: 45.7 % (ref 36.0–46.0)
Hemoglobin: 15 g/dL (ref 12.0–15.0)
MCH: 29.9 pg (ref 26.0–34.0)
MCHC: 32.8 g/dL (ref 30.0–36.0)
MCV: 91.2 fL (ref 80.0–100.0)
Platelets: 155 10*3/uL (ref 150–400)
RBC: 5.01 MIL/uL (ref 3.87–5.11)
RDW: 11.9 % (ref 11.5–15.5)
WBC: 5.7 10*3/uL (ref 4.0–10.5)
nRBC: 0 % (ref 0.0–0.2)

## 2018-08-31 LAB — CBG MONITORING, ED: Glucose-Capillary: 85 mg/dL (ref 70–99)

## 2018-08-31 MED ORDER — SODIUM CHLORIDE 0.9% FLUSH: 3.0000 mL | Freq: Once | INTRAVENOUS | Status: DC

## 2018-08-31 NOTE — ED Triage Notes (Addendum)
Patient c/o intermitten "flu" symptoms for 2 days (headache, dizziness, dry cough)   Patient states this morning she got up to go the bathroom and "collasped while trying to get up from commode".    Patient states her husband was hanging out with someone who tested positive for covid last week.   Patient states this morning she was nauseous  patient states they are doing house work and painting in her home.  CBG-85 in triage  A/Ox4 Ambulatory in triage.

## 2018-08-31 NOTE — Telephone Encounter (Signed)
Patient's husband is calling to report they have recently been sick with COVID symptoms- he is going to reach out to her PCP for appointment.

## 2018-08-31 NOTE — Discharge Instructions (Addendum)
Drink plenty of fluids.  Rest at home and follow-up with your family doctor if any problem

## 2018-08-31 NOTE — ED Provider Notes (Signed)
[facility] DEPT Provider Note   CSN: [id number] Arrival date & time: 08/31/18  1243     History   Chief Complaint Chief Complaint  Patient presents with  . Near Syncope  . Headache  . Dizziness  . Cough    HPI Patricia Marshall is a 73 y.o. female.     Patient states that she felt weak couple days ago and then passed out.  Patient had a mild cough but now she feels much better back to her normal  The history is provided by the patient.  Near Syncope This is a new problem. The current episode started less than 1 hour ago. The problem occurs rarely. The problem has been resolved. Associated symptoms include headaches. Pertinent negatives include no chest pain and no abdominal pain. Nothing aggravates the symptoms. Nothing relieves the symptoms. She has tried nothing for the symptoms. The treatment provided no relief.  Headache Associated symptoms: cough, dizziness and near-syncope   Associated symptoms: no abdominal pain, no back pain, no congestion, no diarrhea, no fatigue, no seizures and no sinus pressure   Dizziness Associated symptoms: headaches   Associated symptoms: no chest pain and no diarrhea   Cough Associated symptoms: headaches   Associated symptoms: no chest pain, no eye discharge and no rash     History reviewed. No pertinent past medical history.  Patient Active Problem List   Diagnosis Date Noted  . Piriformis syndrome of left side 07/12/2015  . Degenerative arthritis of left knee 07/12/2015  . BEE STING REACTION, LOCAL 10/24/2006    Past Surgical History:  Procedure Laterality Date  . BACK SURGERY     1996     OB History   No obstetric history on file.      Home Medications    Prior to Admission medications   Medication Sig Start Date End Date Taking? Authorizing Provider  Vitamin D, Cholecalciferol, 25 MCG (1000 UT) CAPS Take 2,000 Units by mouth daily.   Yes [provider]    Family History  History reviewed. No pertinent family history.  Social History Social History   Tobacco Use  . Smoking status: Never Smoker  . Smokeless tobacco: Never Used  Substance Use Topics  . Alcohol use: Not on file  . Drug use: Not on file     Allergies   Ampicillin   Review of Systems Review of Systems  Constitutional: Negative for appetite change and fatigue.  HENT: Negative for congestion, ear discharge and sinus pressure.   Eyes: Negative for discharge.  Respiratory: Positive for cough.   Cardiovascular: Positive for near-syncope. Negative for chest pain.  Gastrointestinal: Negative for abdominal pain and diarrhea.  Genitourinary: Negative for frequency and hematuria.  Musculoskeletal: Negative for back pain.  Skin: Negative for rash.  Neurological: Positive for dizziness and headaches. Negative for seizures.  Psychiatric/Behavioral: Negative for hallucinations.     Physical Exam Updated Vital Signs BP 138/78   Pulse 66   Temp 98.5 F (36.9 C) (Oral)   Resp 16   SpO2 99%   Physical Exam Vitals signs and nursing note reviewed.  Constitutional:      Appearance: She is well-developed.  HENT:     Head: Normocephalic.     Mouth/Throat:     Mouth: Mucous membranes are moist.  Eyes:     General: No scleral icterus.    Conjunctiva/sclera: Conjunctivae normal.  Neck:     Musculoskeletal: Neck supple.     Thyroid: No thyromegaly.  Cardiovascular:     Rate and Rhythm: Normal rate and regular rhythm.     Heart sounds: No murmur. No friction rub. No gallop.   Pulmonary:     Breath sounds: No stridor. No wheezing or rales.  Chest:     Chest wall: No tenderness.  Abdominal:     General: There is no distension.     Tenderness: There is no abdominal tenderness. There is no rebound.  Musculoskeletal: Normal range of motion.  Lymphadenopathy:     Cervical: No cervical adenopathy.  Skin:    Findings: No erythema or rash.  Neurological:     Mental Status: She is  oriented to person, place, and time.     Motor: No abnormal muscle tone.     Coordination: Coordination normal.  Psychiatric:        Behavior: Behavior normal.      ED Treatments / Results  Labs (all labs ordered are listed, but only abnormal results are displayed) Labs Reviewed  BASIC METABOLIC PANEL - Abnormal; Notable for the following components:      Result Value   Sodium 134 (*)    Potassium 3.4 (*)    Glucose, Bld 102 (*)    All other components within normal limits  URINALYSIS, ROUTINE W REFLEX MICROSCOPIC - Abnormal; Notable for the following components:   APPearance HAZY (*)    Ketones, ur 80 (*)    Protein, ur 30 (*)    All other components within normal limits  CBC  CBG MONITORING, ED    EKG    Radiology No results found.  Procedures Procedures (including critical care time)  Medications Ordered in ED Medications - No data to display   Initial Impression / Assessment and Plan / ED Course  I have reviewed the triage vital signs and the nursing notes.  Pertinent labs & imaging results that were available during my care of the patient were reviewed by me and considered in my medical decision making (see chart for details).        Labs unremarkable.  Patient refused chest x-ray.  Patient with syncopal episode and has returned to baseline.  She may have had a virus and now feels much better.  She will follow-up as needed  Final Clinical Impressions(s) / ED Diagnoses   Final diagnoses:  Near syncope    ED Discharge Orders    None       Milton Ferguson, MD 08/31/18 2104

## 2018-09-07 DIAGNOSIS — R1111 Vomiting without nausea: Secondary | ICD-10-CM | POA: Diagnosis not present

## 2018-09-15 DIAGNOSIS — R600 Localized edema: Secondary | ICD-10-CM | POA: Diagnosis not present

## 2018-09-15 DIAGNOSIS — R634 Abnormal weight loss: Secondary | ICD-10-CM | POA: Diagnosis not present

## 2018-09-15 DIAGNOSIS — R42 Dizziness and giddiness: Secondary | ICD-10-CM | POA: Diagnosis not present

## 2018-09-17 DIAGNOSIS — R42 Dizziness and giddiness: Secondary | ICD-10-CM | POA: Diagnosis not present

## 2018-09-26 DIAGNOSIS — R600 Localized edema: Secondary | ICD-10-CM | POA: Diagnosis not present

## 2018-10-07 ENCOUNTER — Ambulatory Visit
Admission: RE | Admit: 2018-10-07 | Discharge: 2018-10-07 | Disposition: A | Discharge status: Home / Self Care | Payer: Medicare HMO | Source: Ambulatory Visit | Attending: Obstetrics & Gynecology | Admitting: Obstetrics & Gynecology

## 2018-10-07 ENCOUNTER — Other Ambulatory Visit: Payer: Self-pay

## 2018-10-07 DIAGNOSIS — M8589 Other specified disorders of bone density and structure, multiple sites: Secondary | ICD-10-CM | POA: Diagnosis not present

## 2018-10-07 DIAGNOSIS — E2839 Other primary ovarian failure: Secondary | ICD-10-CM

## 2018-10-07 DIAGNOSIS — Z780 Asymptomatic menopausal state: Secondary | ICD-10-CM

## 2018-11-18 ENCOUNTER — Encounter: Payer: Self-pay | Admitting: Adult Health

## 2018-11-18 ENCOUNTER — Other Ambulatory Visit: Payer: Self-pay

## 2018-11-18 ENCOUNTER — Ambulatory Visit (INDEPENDENT_AMBULATORY_CARE_PROVIDER_SITE_OTHER): Payer: Medicare HMO | Admitting: Adult Health

## 2018-11-18 VITALS — BP 148/84 | Temp 98.0°F | Wt 120.0 lb

## 2018-11-18 DIAGNOSIS — E559 Vitamin D deficiency, unspecified: Secondary | ICD-10-CM | POA: Diagnosis not present

## 2018-11-18 DIAGNOSIS — Z7689 Persons encountering health services in other specified circumstances: Secondary | ICD-10-CM

## 2018-11-18 DIAGNOSIS — E785 Hyperlipidemia, unspecified: Secondary | ICD-10-CM

## 2018-11-18 NOTE — Progress Notes (Signed)
Patient presents to clinic today to establish care. She is a pleasant 73 year old female who  has no past medical history on file.  She is transferring care from [facility]   Acute Concerns: Establish Care   Chronic Issues: Vitamin D Deficiency - Takes Vitamin D 2000 units daily.  Her last vitamin D level in June was 26.   Hyperlipidemia - mildly elevated in the past. She has never taken a statin and would like to not have to take one.   Health Maintenance: Dental -- Dr. Delilah Shan Vision -- Dr. Sabra Heck Immunizations -- Colonoscopy -- 2019 - Five year plan for polyps Mammogram -- 2019  PAP --  2006  Bone Density --  Done 09/2018 Diet: Heart healthy diet  Exercise: Plays pickle ball three times a week. Does silver sneakers:  twice a week. Weight bearing exercise at night   No past medical history on file.  Past Surgical History:  Procedure Laterality Date  . BACK SURGERY     1996    Current Outpatient Medications on File Prior to Visit  Medication Sig Dispense Refill  . Vitamin D, Cholecalciferol, 25 MCG (1000 UT) CAPS Take 2,000 Units by mouth daily.     No current facility-administered medications on file prior to visit.     Allergies  Allergen Reactions  . Ampicillin Other (See Comments)    Drug fever Did it involve swelling of the face/tongue/throat, SOB, or low BP? No Did it involve sudden or severe rash/hives, skin peeling, or any reaction on the inside of your mouth or nose? No Did you need to seek medical attention at a hospital or doctor's office? No When did it last happen?Over 10 years ago If all above answers are "NO", may proceed with cephalosporin use.    No family history on file.  Social History   Socioeconomic History  . Marital status: Married    Spouse name: Not on file  . Number of children: Not on file  . Years of education: Not on file  . Highest education level: Not on file  Occupational History  . Not on file  Social  Needs  . Financial resource strain: Not on file  . Food insecurity    Worry: Not on file    Inability: Not on file  . Transportation needs    Medical: Not on file    Non-medical: Not on file  Tobacco Use  . Smoking status: Never Smoker  . Smokeless tobacco: Never Used  Substance and Sexual Activity  . Alcohol use: Not on file  . Drug use: Not on file  . Sexual activity: Not on file  Lifestyle  . Physical activity    Days per week: Not on file    Minutes per session: Not on file  . Stress: Not on file  Relationships  . Social Herbalist on phone: Not on file    Gets together: Not on file    Attends religious service: Not on file    Active member of club or organization: Not on file    Attends meetings of clubs or organizations: Not on file    Relationship status: Not on file  . Intimate partner violence    Fear of current or ex partner: Not on file    Emotionally abused: Not on file    Physically abused: Not on file    Forced sexual activity: Not on file  Other Topics Concern  . Not  on file  Social History Narrative  . Not on file    Review of Systems  Constitutional: Negative.   HENT: Negative.   Eyes: Negative.   Respiratory: Negative.   Cardiovascular: Negative.   Gastrointestinal: Negative.   Genitourinary: Negative.   Musculoskeletal: Negative.   Skin: Negative.   Neurological: Negative.   Endo/Heme/Allergies: Negative.   Psychiatric/Behavioral: Negative.   All other systems reviewed and are negative.   BP (!) 148/84   Temp 98 F (36.7 C)   Wt 120 lb (54.4 kg)   BMI 19.37 kg/m   Physical Exam Vitals signs and nursing note reviewed.  Constitutional:      General: She is not in acute distress.    Appearance: Normal appearance. She is normal weight. She is not diaphoretic.  HENT:     Head: Normocephalic and atraumatic.     Nose: Nose normal. No congestion or rhinorrhea.     Mouth/Throat:     Mouth: Mucous membranes are moist.      Pharynx: Oropharynx is clear. No oropharyngeal exudate or posterior oropharyngeal erythema.  Eyes:     General: No scleral icterus.       Right eye: No discharge.        Left eye: No discharge.     Conjunctiva/sclera: Conjunctivae normal.     Pupils: Pupils are equal, round, and reactive to light.  Neck:     Thyroid: No thyromegaly.     Vascular: No JVD.     Trachea: No tracheal deviation.  Cardiovascular:     Rate and Rhythm: Normal rate and regular rhythm.     Heart sounds: Normal heart sounds. No murmur. No friction rub. No gallop.   Pulmonary:     Effort: Pulmonary effort is normal. No respiratory distress.     Breath sounds: Normal breath sounds. No stridor. No wheezing, rhonchi or rales.  Chest:     Chest wall: No tenderness.  Musculoskeletal: Normal range of motion.        General: No swelling, tenderness, deformity or signs of injury.     Right lower leg: No edema.     Left lower leg: No edema.  Skin:    General: Skin is warm and dry.     Coloration: Skin is not jaundiced or pale.     Findings: No bruising, erythema, lesion or rash.  Neurological:     General: No focal deficit present.     Mental Status: She is alert and oriented to person, place, and time. Mental status is at baseline.     Motor: No abnormal muscle tone.  Psychiatric:        Mood and Affect: Mood normal.        Behavior: Behavior normal.        Thought Content: Thought content normal.        Judgment: Judgment normal.     Recent Results (from the past 2160 hour(s))  Basic metabolic panel     Status: Abnormal   Collection Time: 08/31/18  1:02 PM  Result Value Ref Range   Sodium 134 (L) 135 - 145 mmol/L   Potassium 3.4 (L) 3.5 - 5.1 mmol/L   Chloride 98 98 - 111 mmol/L   CO2 25 22 - 32 mmol/L   Glucose, Bld 102 (H) 70 - 99 mg/dL   BUN 15 8 - 23 mg/dL   Creatinine, Ser 0.69 0.44 - 1.00 mg/dL   Calcium 9.6 8.9 - 10.3 mg/dL   GFR calc non  Af Amer >60 >60 mL/min   GFR calc Af Amer >60 >60 mL/min    Anion gap 11 5 - 15    Comment: Performed at [facility], [place] [address] [place], [place] [address]  CBC     Status: None   Collection Time: 08/31/18  1:02 PM  Result Value Ref Range   WBC 5.7 4.0 - 10.5 K/uL   RBC 5.01 3.87 - 5.11 MIL/uL   Hemoglobin 15.0 12.0 - 15.0 g/dL   HCT 45.7 36.0 - 46.0 %   MCV 91.2 80.0 - 100.0 fL   MCH 29.9 26.0 - 34.0 pg   MCHC 32.8 30.0 - 36.0 g/dL   RDW 11.9 11.5 - 15.5 %   Platelets 155 150 - 400 K/uL   nRBC 0.0 0.0 - 0.2 %    Comment: Performed at [facility], [place] [address] [place], [place] [address]  Urinalysis, Routine w reflex microscopic     Status: Abnormal   Collection Time: 08/31/18  1:02 PM  Result Value Ref Range   Color, Urine YELLOW YELLOW   APPearance HAZY (A) CLEAR   Specific Gravity, Urine 1.020 1.005 - 1.030   pH 6.0 5.0 - 8.0   Glucose, UA NEGATIVE NEGATIVE mg/dL   Hgb urine dipstick NEGATIVE NEGATIVE   Bilirubin Urine NEGATIVE NEGATIVE   Ketones, ur 80 (A) NEGATIVE mg/dL   Protein, ur 30 (A) NEGATIVE mg/dL   Nitrite NEGATIVE NEGATIVE   Leukocytes,Ua NEGATIVE NEGATIVE   RBC / HPF 0-5 0 - 5 RBC/hpf   WBC, UA 0-5 0 - 5 WBC/hpf   Bacteria, UA NONE SEEN NONE SEEN   Squamous Epithelial / LPF 0-5 0 - 5   Mucus PRESENT    Hyaline Casts, UA PRESENT     Comment: Performed at [facility], [place] [address] [place], [place] [address]  CBG monitoring, ED     Status: None   Collection Time: 08/31/18  1:02 PM  Result Value Ref Range   Glucose-Capillary 85 70 - 99 mg/dL    Assessment/Plan: 1. Encounter to establish care - Follow up in June for CPE - Will request records from [facility]  2. Vitamin D deficiency - Continue with Vitamin D supplement  3. Hyperlipidemia, unspecified hyperlipidemia type - Continue with heart healthy diet and exercise   Dorothyann Peng, NP

## 2018-11-25 DIAGNOSIS — R69 Illness, unspecified: Secondary | ICD-10-CM | POA: Diagnosis not present

## 2018-12-17 ENCOUNTER — Encounter: Payer: Self-pay | Admitting: Family Medicine

## 2018-12-31 ENCOUNTER — Other Ambulatory Visit: Payer: Self-pay

## 2018-12-31 ENCOUNTER — Encounter: Payer: Self-pay | Admitting: Adult Health

## 2018-12-31 ENCOUNTER — Ambulatory Visit (INDEPENDENT_AMBULATORY_CARE_PROVIDER_SITE_OTHER): Payer: Medicare HMO | Admitting: Adult Health

## 2018-12-31 VITALS — BP 160/80 | Temp 98.8°F | Wt 122.0 lb

## 2018-12-31 DIAGNOSIS — Z23 Encounter for immunization: Secondary | ICD-10-CM

## 2018-12-31 DIAGNOSIS — R600 Localized edema: Secondary | ICD-10-CM

## 2018-12-31 NOTE — Progress Notes (Signed)
Subjective:    Patient ID: Patricia Marshall, female    DOB: 1945-10-29, 73 y.o.   MRN: [id number]  HPI  73 year old healthy and active female who  has a past medical history of Chicken pox, High cholesterol, and Vitamin D deficiency.  She presents to the office today for on again and off again left sided lower extremity edema.  This has been happening for greater than 1 month.  Reports some days of swelling is much worse and other days like today the swelling improves.  She is very active and does a lot of aerobic exercise without shortness of breath, chest pain, or signs of claudication.  He eats a heart healthy diet and does not add salt.  She drinks plenty of water throughout the day.  Elevation seems to help slightly.  Female presents to entire foot, ankle, and up to mid calf  She denies edema to right lower extremity Review of Systems See HPI   Past Medical History:  Diagnosis Date  . Chicken pox   . High cholesterol   . Vitamin D deficiency     Social History   Socioeconomic History  . Marital status: Married    Spouse name: Not on file  . Number of children: Not on file  . Years of education: Not on file  . Highest education level: Not on file  Occupational History  . Not on file  Social Needs  . Financial resource strain: Not on file  . Food insecurity    Worry: Not on file    Inability: Not on file  . Transportation needs    Medical: Not on file    Non-medical: Not on file  Tobacco Use  . Smoking status: Never Smoker  . Smokeless tobacco: Never Used  Substance and Sexual Activity  . Alcohol use: Not on file  . Drug use: Never  . Sexual activity: Not on file  Lifestyle  . Physical activity    Days per week: Not on file    Minutes per session: Not on file  . Stress: Not on file  Relationships  . Social Herbalist on phone: Not on file    Gets together: Not on file    Attends religious service: Not on file    Active member of club or  organization: Not on file    Attends meetings of clubs or organizations: Not on file    Relationship status: Not on file  . Intimate partner violence    Fear of current or ex partner: Not on file    Emotionally abused: Not on file    Physically abused: Not on file    Forced sexual activity: Not on file  Other Topics Concern  . Not on file  Social History Narrative   Married    Retired     Past Surgical History:  Procedure Laterality Date  . APPENDECTOMY  1959  . BACK SURGERY     1996  . TONSILLECTOMY AND ADENOIDECTOMY  1954    Family History  Problem Relation Age of Onset  . Stomach cancer Mother 66  . Heart attack Father 48  . Cancer Sister 50       adenocarcinoma  . Heart attack Brother 81  . Heart attack Brother 46    Allergies  Allergen Reactions  . Ampicillin Other (See Comments)    Drug fever Did it involve swelling of the face/tongue/throat, SOB, or low BP? No Did it involve  sudden or severe rash/hives, skin peeling, or any reaction on the inside of your mouth or nose? No Did you need to seek medical attention at a hospital or doctor's office? No When did it last happen?Over 10 years ago If all above answers are "NO", may proceed with cephalosporin use.    Current Outpatient Medications on File Prior to Visit  Medication Sig Dispense Refill  . Cholecalciferol (VITAMIN D) 50 MCG (2000 UT) tablet Take 2,000 Units by mouth daily.     No current facility-administered medications on file prior to visit.     BP (!) 160/80   Temp 98.8 F (37.1 C)   Wt 122 lb (55.3 kg)   BMI 19.69 kg/m       Objective:   Physical Exam Vitals signs and nursing note reviewed.  Constitutional:      Appearance: Normal appearance.  Cardiovascular:     Rate and Rhythm: Normal rate and regular rhythm.     Pulses: Normal pulses.     Heart sounds: Normal heart sounds.  Pulmonary:     Effort: Pulmonary effort is normal.     Breath sounds: Normal breath sounds.   Musculoskeletal:        General: No swelling, tenderness, deformity or signs of injury.     Right lower leg: No edema.     Left lower leg: Edema (+ 1 pitting edema to left lower leg above ankle to mid shin) present.  Skin:    General: Skin is warm and dry.     Comments: Very mild discoloration to left foot and toes  Neurological:     General: No focal deficit present.     Mental Status: She is alert and oriented to person, place, and time.  Psychiatric:        Mood and Affect: Mood normal.        Behavior: Behavior normal.        Thought Content: Thought content normal.        Judgment: Judgment normal.       Assessment & Plan:  1. Lower extremity edema -Very mild today.  We will start off with echocardiogram and consider referral to vascular in the future. - ECHOCARDIOGRAM COMPLETE; Future -Encouraged to continue to stay active, low-sodium diet, drink plenty of water, can use compression socks and/or elevation while at rest  2. Need for prophylactic vaccination and inoculation against influenza  - Flu Vaccine QUAD High Dose(Fluad)   Dorothyann Peng, NP

## 2019-01-12 ENCOUNTER — Telehealth: Payer: Self-pay

## 2019-01-12 NOTE — Telephone Encounter (Signed)
Spoke to the pt.  She wanted to know if there are other tests that could be ordered.  She has an upcoming echo scheduled.  Advised that we will need to get the results of the echo back before other tests can be ordered.  Pt agreeable and nothing further needed.

## 2019-01-12 NOTE — Telephone Encounter (Signed)
Copied from [facility] [id number]. Topic: General - Other >> Jan 12, 2019 10:42 AM Rainey Pines A wrote: Patient would like to speak with nurse in regards to her appt for tomorrow for procedure. Please advise

## 2019-01-14 ENCOUNTER — Ambulatory Visit (HOSPITAL_COMMUNITY): Payer: Medicare HMO | Attending: Cardiovascular Disease

## 2019-01-14 ENCOUNTER — Other Ambulatory Visit: Payer: Self-pay

## 2019-01-14 DIAGNOSIS — R600 Localized edema: Secondary | ICD-10-CM | POA: Diagnosis not present

## 2019-02-18 DIAGNOSIS — R69 Illness, unspecified: Secondary | ICD-10-CM | POA: Diagnosis not present

## 2019-04-02 ENCOUNTER — Other Ambulatory Visit: Payer: Medicare HMO

## 2019-04-05 ENCOUNTER — Ambulatory Visit: Payer: Medicare HMO | Attending: Internal Medicine

## 2019-04-05 DIAGNOSIS — Z20822 Contact with and (suspected) exposure to covid-19: Secondary | ICD-10-CM

## 2019-04-06 LAB — NOVEL CORONAVIRUS, NAA: SARS-CoV-2, NAA: NOT DETECTED

## 2019-04-21 DIAGNOSIS — H40013 Open angle with borderline findings, low risk, bilateral: Secondary | ICD-10-CM | POA: Diagnosis not present

## 2019-04-21 DIAGNOSIS — H52223 Regular astigmatism, bilateral: Secondary | ICD-10-CM | POA: Diagnosis not present

## 2019-04-21 DIAGNOSIS — H1045 Other chronic allergic conjunctivitis: Secondary | ICD-10-CM | POA: Diagnosis not present

## 2019-04-21 DIAGNOSIS — H5203 Hypermetropia, bilateral: Secondary | ICD-10-CM | POA: Diagnosis not present

## 2019-04-21 DIAGNOSIS — H2513 Age-related nuclear cataract, bilateral: Secondary | ICD-10-CM | POA: Diagnosis not present

## 2019-04-21 DIAGNOSIS — H40053 Ocular hypertension, bilateral: Secondary | ICD-10-CM | POA: Diagnosis not present

## 2019-04-21 DIAGNOSIS — H524 Presbyopia: Secondary | ICD-10-CM | POA: Diagnosis not present

## 2019-04-21 DIAGNOSIS — H2510 Age-related nuclear cataract, unspecified eye: Secondary | ICD-10-CM | POA: Diagnosis not present

## 2019-05-02 ENCOUNTER — Ambulatory Visit: Payer: Medicare HMO | Attending: Internal Medicine

## 2019-05-02 DIAGNOSIS — Z23 Encounter for immunization: Secondary | ICD-10-CM

## 2019-05-02 NOTE — Progress Notes (Signed)
   Covid-19 Vaccination Clinic  Name:  Patricia Marshall    MRN: [id number] DOB: 12/07/1945  05/02/2019  [name] was observed post Covid-19 immunization for 15 minutes without incidence. She was provided with Vaccine Information Sheet and instruction to access the V-Safe system.   [name] was instructed to call 911 with any severe reactions post vaccine: Marland Kitchen Difficulty breathing  . Swelling of your face and throat  . A fast heartbeat  . A bad rash all over your body  . Dizziness and weakness    Immunizations Administered    Name Date Dose VIS Date Route   Pfizer COVID-19 Vaccine 05/02/2019  1:22 PM 0.3 mL 02/26/2019 Intramuscular   Manufacturer: [place]   Lot: [id number]   [place]: [id number]

## 2019-05-26 ENCOUNTER — Ambulatory Visit: Payer: Medicare HMO

## 2019-05-31 ENCOUNTER — Ambulatory Visit: Payer: Medicare HMO | Attending: Internal Medicine

## 2019-05-31 DIAGNOSIS — Z23 Encounter for immunization: Secondary | ICD-10-CM

## 2019-06-22 ENCOUNTER — Other Ambulatory Visit: Payer: Self-pay | Admitting: Family Medicine

## 2019-06-22 DIAGNOSIS — Z1231 Encounter for screening mammogram for malignant neoplasm of breast: Secondary | ICD-10-CM

## 2019-07-01 DIAGNOSIS — R69 Illness, unspecified: Secondary | ICD-10-CM | POA: Diagnosis not present

## 2019-10-19 DIAGNOSIS — R69 Illness, unspecified: Secondary | ICD-10-CM | POA: Diagnosis not present

## 2019-11-03 ENCOUNTER — Ambulatory Visit (INDEPENDENT_AMBULATORY_CARE_PROVIDER_SITE_OTHER): Payer: Medicare HMO | Admitting: Adult Health

## 2019-11-03 ENCOUNTER — Encounter: Payer: Self-pay | Admitting: Adult Health

## 2019-11-03 ENCOUNTER — Other Ambulatory Visit: Payer: Self-pay

## 2019-11-03 VITALS — BP 130/80 | Temp 98.1°F | Ht 65.0 in | Wt 121.0 lb

## 2019-11-03 DIAGNOSIS — E785 Hyperlipidemia, unspecified: Secondary | ICD-10-CM

## 2019-11-03 DIAGNOSIS — Z0000 Encounter for general adult medical examination without abnormal findings: Secondary | ICD-10-CM | POA: Diagnosis not present

## 2019-11-03 DIAGNOSIS — E559 Vitamin D deficiency, unspecified: Secondary | ICD-10-CM

## 2019-11-03 DIAGNOSIS — D492 Neoplasm of unspecified behavior of bone, soft tissue, and skin: Secondary | ICD-10-CM | POA: Diagnosis not present

## 2019-11-03 NOTE — Progress Notes (Signed)
Subjective:    Patient ID: Patricia Marshall, female    DOB: 06-23-1945, 74 y.o.   MRN: [id number]  HPI Patient presents for yearly preventative medicine examination.  She is a pleasant 74 year old female who  has a past medical history of Chicken pox, High cholesterol, and Vitamin D deficiency.  Vitamin D Deficiency - takes OTC vitamin D supplements  Hyperlipidemia -has been mildly elevated in the past.  She has never been on a statin.  Skin Neoplasms - she would like a referral to dermatology for routine skin checks.   All immunizations and health maintenance protocols were reviewed with the patient and needed orders were placed.  Appropriate screening laboratory values were ordered for the patient including screening of hyperlipidemia, renal function and hepatic function.  Medication reconciliation,  past medical history, social history, problem list and allergies were reviewed in detail with the patient  Goals were established with regard to weight loss, exercise, and  diet in compliance with medications.  He is very active, plays pickle ball 3 times a week participates in Silver sneakers twice a week and does weightbearing exercise at night  Her last bone density was in July 2020 showed osteopenia.  She had a colonoscopy in 2019 and is on the 5-year plan due to polyps.  She is due for her mammogram and will schedule this at her leisure.  Denies any acute complaints    Review of Systems  Constitutional: Negative.   HENT: Negative.   Eyes: Negative.   Respiratory: Negative.   Cardiovascular: Negative.   Gastrointestinal: Negative.   Endocrine: Negative.   Genitourinary: Negative.   Musculoskeletal: Negative.   Skin: Negative.   Allergic/Immunologic: Negative.   Neurological: Negative.   Hematological: Negative.   Psychiatric/Behavioral: Negative.    Past Medical History:  Diagnosis Date  . Chicken pox   . High cholesterol   . Vitamin D deficiency     Social  History   Socioeconomic History  . Marital status: Married    Spouse name: Not on file  . Number of children: Not on file  . Years of education: Not on file  . Highest education level: Not on file  Occupational History  . Not on file  Tobacco Use  . Smoking status: Never Smoker  . Smokeless tobacco: Never Used  Substance and Sexual Activity  . Alcohol use: Not on file  . Drug use: Never  . Sexual activity: Not on file  Other Topics Concern  . Not on file  Social History Narrative   Married    Retired    Investment banker, operational of Radio broadcast assistant Strain:   . Difficulty of Paying Living Expenses:   Food Insecurity:   . Worried About Charity fundraiser in the Last Year:   . Arboriculturist in the Last Year:   Transportation Needs:   . Film/video editor (Medical):   Marland Kitchen Lack of Transportation (Non-Medical):   Physical Activity:   . Days of Exercise per Week:   . Minutes of Exercise per Session:   Stress:   . Feeling of Stress :   Social Connections:   . Frequency of Communication with Friends and Family:   . Frequency of Social Gatherings with Friends and Family:   . Attends Religious Services:   . Active Member of Clubs or Organizations:   . Attends Archivist Meetings:   Marland Kitchen Marital Status:   Intimate Partner Violence:   .  Fear of Current or Ex-Partner:   . Emotionally Abused:   Marland Kitchen Physically Abused:   . Sexually Abused:     Past Surgical History:  Procedure Laterality Date  . APPENDECTOMY  1959  . BACK SURGERY     1996  . TONSILLECTOMY AND ADENOIDECTOMY  1954    Family History  Problem Relation Age of Onset  . Stomach cancer Mother 31  . Heart attack Father 70  . Cancer Sister 2       adenocarcinoma  . Heart attack Brother 81  . Heart attack Brother 77    Allergies  Allergen Reactions  . Ampicillin Other (See Comments)    Drug fever Did it involve swelling of the face/tongue/throat, SOB, or low BP? No Did it involve sudden  or severe rash/hives, skin peeling, or any reaction on the inside of your mouth or nose? No Did you need to seek medical attention at a hospital or doctor's office? No When did it last happen?Over 10 years ago If all above answers are "NO", may proceed with cephalosporin use.    Current Outpatient Medications on File Prior to Visit  Medication Sig Dispense Refill  . Cholecalciferol (VITAMIN D) 50 MCG (2000 UT) tablet Take 2,000 Units by mouth daily.     No current facility-administered medications on file prior to visit.    BP 130/80   Temp 98.1 F (36.7 C)   Ht 5' 5" (1.651 m)   Wt 121 lb (54.9 kg)   BMI 20.14 kg/m       Objective:   Physical Exam Vitals and nursing note reviewed.  Constitutional:      General: She is not in acute distress.    Appearance: Normal appearance. She is well-developed. She is not ill-appearing.  HENT:     Head: Normocephalic and atraumatic.     Right Ear: Tympanic membrane, ear canal and external ear normal. There is no impacted cerumen.     Left Ear: Tympanic membrane, ear canal and external ear normal. There is no impacted cerumen.     Nose: Nose normal. No congestion or rhinorrhea.     Mouth/Throat:     Mouth: Mucous membranes are moist.     Pharynx: Oropharynx is clear. No oropharyngeal exudate or posterior oropharyngeal erythema.  Eyes:     General:        Right eye: No discharge.        Left eye: No discharge.     Extraocular Movements: Extraocular movements intact.     Conjunctiva/sclera: Conjunctivae normal.     Pupils: Pupils are equal, round, and reactive to light.  Neck:     Thyroid: No thyromegaly.     Vascular: No carotid bruit.     Trachea: No tracheal deviation.  Cardiovascular:     Rate and Rhythm: Normal rate and regular rhythm.     Pulses: Normal pulses.     Heart sounds: Normal heart sounds. No murmur heard.  No friction rub. No gallop.   Pulmonary:     Effort: Pulmonary effort is normal. No respiratory distress.      Breath sounds: Normal breath sounds. No stridor. No wheezing, rhonchi or rales.  Chest:     Chest wall: No tenderness.  Abdominal:     General: Abdomen is flat. Bowel sounds are normal. There is no distension.     Palpations: Abdomen is soft. There is no mass.     Tenderness: There is no abdominal tenderness. There is no right CVA  tenderness, left CVA tenderness, guarding or rebound.     Hernia: No hernia is present.  Musculoskeletal:        General: No swelling, tenderness, deformity or signs of injury. Normal range of motion.     Cervical back: Normal range of motion and neck supple.     Right lower leg: No edema.     Left lower leg: No edema.  Lymphadenopathy:     Cervical: No cervical adenopathy.  Skin:    General: Skin is warm and dry.     Coloration: Skin is not jaundiced or pale.     Findings: No bruising, erythema, lesion or rash.  Neurological:     General: No focal deficit present.     Mental Status: She is alert and oriented to person, place, and time.     Cranial Nerves: No cranial nerve deficit.     Sensory: No sensory deficit.     Motor: No weakness.     Coordination: Coordination normal.     Gait: Gait normal.     Deep Tendon Reflexes: Reflexes normal.  Psychiatric:        Mood and Affect: Mood normal.        Behavior: Behavior normal.        Thought Content: Thought content normal.        Judgment: Judgment normal.       Assessment & Plan:  1. Routine general medical examination at a health care facility - Benign exam - Follow up in one year or sooner if needed - Schedule mammogram  - Continue to stay active and exercise - CMP with eGFR([facility]); Future - CBC with Differential/Platelet; Future - TSH; Future - Lipid panel; Future  2. Vitamin D deficiency  - Vitamin D, 25-hydroxy; Future  3. Hyperlipidemia, unspecified hyperlipidemia type - Consider statin  - CMP with eGFR([facility]); Future - CBC with Differential/Platelet; Future - TSH; Future -  Lipid panel; Future  4. Skin neoplasm  - Ambulatory referral to Dermatology   Dorothyann Peng, NP

## 2019-11-04 LAB — CBC WITH DIFFERENTIAL/PLATELET
Absolute Monocytes: 447 cells/uL (ref 200–950)
Basophils Absolute: 39 cells/uL (ref 0–200)
Basophils Relative: 0.9 %
Eosinophils Absolute: 39 cells/uL (ref 15–500)
Eosinophils Relative: 0.9 %
HCT: 42.4 % (ref 35.0–45.0)
Hemoglobin: 13.9 g/dL (ref 11.7–15.5)
Lymphs Abs: 1608 cells/uL (ref 850–3900)
MCH: 29.8 pg (ref 27.0–33.0)
MCHC: 32.8 g/dL (ref 32.0–36.0)
MCV: 91 fL (ref 80.0–100.0)
MPV: 12.6 fL — ABNORMAL HIGH (ref 7.5–12.5)
Monocytes Relative: 10.4 %
Neutro Abs: 2167 cells/uL (ref 1500–7800)
Neutrophils Relative %: 50.4 %
Platelets: 186 10*3/uL (ref 140–400)
RBC: 4.66 10*6/uL (ref 3.80–5.10)
RDW: 11.8 % (ref 11.0–15.0)
Total Lymphocyte: 37.4 %
WBC: 4.3 10*3/uL (ref 3.8–10.8)

## 2019-11-04 LAB — COMPLETE METABOLIC PANEL WITH GFR
AG Ratio: 1.8 (calc) (ref 1.0–2.5)
ALT: 11 U/L (ref 6–29)
AST: 17 U/L (ref 10–35)
Albumin: 4.5 g/dL (ref 3.6–5.1)
Alkaline phosphatase (APISO): 94 U/L (ref 37–153)
BUN: 19 mg/dL (ref 7–25)
CO2: 27 mmol/L (ref 20–32)
Calcium: 10 mg/dL (ref 8.6–10.4)
Chloride: 101 mmol/L (ref 98–110)
Creat: 0.76 mg/dL (ref 0.60–0.93)
GFR, Est African American: 91 mL/min/{1.73_m2} (ref >=60)
GFR, Est Non African American: 78 mL/min/{1.73_m2} (ref >=60)
Globulin: 2.5 g/dL (calc) (ref 1.9–3.7)
Glucose, Bld: 83 mg/dL (ref 65–99)
Potassium: 4.7 mmol/L (ref 3.5–5.3)
Sodium: 138 mmol/L (ref 135–146)
Total Bilirubin: 0.6 mg/dL (ref 0.2–1.2)
Total Protein: 7 g/dL (ref 6.1–8.1)

## 2019-11-04 LAB — LIPID PANEL
Cholesterol: 226 mg/dL — ABNORMAL HIGH (ref <200)
HDL: 67 mg/dL (ref >=50)
LDL Cholesterol (Calc): 142 mg/dL (calc) — ABNORMAL HIGH
Non-HDL Cholesterol (Calc): 159 mg/dL (calc) — ABNORMAL HIGH (ref <130)
Total CHOL/HDL Ratio: 3.4 (calc) (ref <5.0)
Triglycerides: 80 mg/dL (ref <150)

## 2019-11-04 LAB — TSH: TSH: 2.17 mIU/L (ref 0.40–4.50)

## 2019-11-04 LAB — VITAMIN D 25 HYDROXY (VIT D DEFICIENCY, FRACTURES): Vit D, 25-Hydroxy: 32 ng/mL (ref 30–100)

## 2019-11-17 DIAGNOSIS — L918 Other hypertrophic disorders of the skin: Secondary | ICD-10-CM | POA: Diagnosis not present

## 2019-11-17 DIAGNOSIS — L821 Other seborrheic keratosis: Secondary | ICD-10-CM | POA: Diagnosis not present

## 2019-11-17 DIAGNOSIS — L819 Disorder of pigmentation, unspecified: Secondary | ICD-10-CM | POA: Diagnosis not present

## 2019-11-17 DIAGNOSIS — D2271 Melanocytic nevi of right lower limb, including hip: Secondary | ICD-10-CM | POA: Diagnosis not present

## 2019-11-17 DIAGNOSIS — D225 Melanocytic nevi of trunk: Secondary | ICD-10-CM | POA: Diagnosis not present

## 2019-11-17 DIAGNOSIS — L814 Other melanin hyperpigmentation: Secondary | ICD-10-CM | POA: Diagnosis not present

## 2019-11-17 DIAGNOSIS — D2272 Melanocytic nevi of left lower limb, including hip: Secondary | ICD-10-CM | POA: Diagnosis not present

## 2019-11-17 DIAGNOSIS — D1801 Hemangioma of skin and subcutaneous tissue: Secondary | ICD-10-CM | POA: Diagnosis not present

## 2019-11-17 DIAGNOSIS — L812 Freckles: Secondary | ICD-10-CM | POA: Diagnosis not present

## 2019-11-17 DIAGNOSIS — L820 Inflamed seborrheic keratosis: Secondary | ICD-10-CM | POA: Diagnosis not present

## 2019-11-25 ENCOUNTER — Telehealth: Payer: Self-pay | Admitting: Adult Health

## 2019-11-25 NOTE — Telephone Encounter (Signed)
Left message for patient to schedule Annual Wellness Visit.  Please schedule with Nurse Health Advisor Shannon Crews, RN at  [facility]  

## 2020-02-08 DIAGNOSIS — R69 Illness, unspecified: Secondary | ICD-10-CM | POA: Diagnosis not present

## 2020-03-24 ENCOUNTER — Ambulatory Visit: Payer: Medicare HMO | Attending: Internal Medicine

## 2020-03-24 DIAGNOSIS — Z23 Encounter for immunization: Secondary | ICD-10-CM

## 2020-03-24 NOTE — Progress Notes (Signed)
   Covid-19 Vaccination Clinic  Name:  Carnella Fryman    MRN: [id number] DOB: 04/03/1945  03/24/2020  Ms. Brereton was observed post Covid-19 immunization for 15 minutes without incident. She was provided with Vaccine Information Sheet and instruction to access the V-Safe system.   Ms. Merlino was instructed to call 911 with any severe reactions post vaccine: Marland Kitchen Difficulty breathing  . Swelling of face and throat  . A fast heartbeat  . A bad rash all over body  . Dizziness and weakness   Immunizations Administered    Name Date Dose VIS Date Route   Pfizer COVID-19 Vaccine 03/24/2020  3:14 PM 0.3 mL 01/05/2020 Intramuscular   Manufacturer: [place]   Lot: [id number]   NDC: [address]-2

## 2020-04-26 DIAGNOSIS — H5203 Hypermetropia, bilateral: Secondary | ICD-10-CM | POA: Diagnosis not present

## 2020-04-27 ENCOUNTER — Telehealth: Payer: Self-pay | Admitting: Adult Health

## 2020-04-27 NOTE — Telephone Encounter (Signed)
Left message for patient to call back and schedule Medicare Annual Wellness Visit (AWV) either virtually or in office. No detailed message left   Last AWVi no information lease schedule at anytime with [facility] Nurse Health Advisor 1 or 2   This should be a 45 minute visit.

## 2020-06-07 NOTE — Progress Notes (Signed)
Subjective:   Patricia Marshall is a 75 y.o. female who presents for an Initial Medicare Annual Wellness Visit.  Review of Systems    N/A  Cardiac Risk Factors include: advanced age (>64men, >18 women);dyslipidemia     Objective:    Today's Vitals   06/08/20 1415  BP: 130/80  Pulse: 79  Temp: 98.6 F (37 C)  TempSrc: Oral  SpO2: 98%  Weight: 122 lb (55.3 kg)  Height: 5\' 5"  (1.651 m)   Body mass index is 20.3 kg/m.  Advanced Directives 06/08/2020 08/31/2018  Does Patient Have a Medical Advance Directive? Yes Yes  Type of Paramedic of [place];Living will Living will  Does patient want to make changes to medical advance directive? No - Patient declined No - Patient declined  Copy of [place] in Chart? No - copy requested -    Current Medications (verified) Outpatient Encounter Medications as of 06/08/2020  Medication Sig  . Cholecalciferol (VITAMIN D) 50 MCG (2000 UT) tablet Take 2,000 Units by mouth daily.   No facility-administered encounter medications on file as of 06/08/2020.    Allergies (verified) Ampicillin   History: Past Medical History:  Diagnosis Date  . Chicken pox   . High cholesterol   . Vitamin D deficiency    Past Surgical History:  Procedure Laterality Date  . APPENDECTOMY  1959  . BACK SURGERY     1996  . TONSILLECTOMY AND ADENOIDECTOMY  1954   Family History  Problem Relation Age of Onset  . Stomach cancer Mother 41  . Heart attack Father 47  . Cancer Sister 63       adenocarcinoma  . Heart attack Brother 81  . Heart attack Brother 74   Social History   Socioeconomic History  . Marital status: Married    Spouse name: Not on file  . Number of children: Not on file  . Years of education: Not on file  . Highest education level: Not on file  Occupational History  . Not on file  Tobacco Use  . Smoking status: Never Smoker  . Smokeless tobacco: Never Used  Substance and Sexual  Activity  . Alcohol use: Not on file  . Drug use: Never  . Sexual activity: Not on file  Other Topics Concern  . Not on file  Social History Narrative   Married    Retired    Scientist, physiological Strain: [employer]   . Difficulty of Paying Living Expenses: Not hard at all  Food Insecurity: No Food Insecurity  . Worried About Charity fundraiser in the Last Year: Never true  . Ran Out of Food in the Last Year: Never true  Transportation Needs: No Transportation Needs  . Lack of Transportation (Medical): No  . Lack of Transportation (Non-Medical): No  Physical Activity: Sufficiently Active  . Days of Exercise per Week: 7 days  . Minutes of Exercise per Session: 150+ min  Stress: No Stress Concern Present  . Feeling of Stress : Not at all  Social Connections: Socially Integrated  . Frequency of Communication with Friends and Family: More than three times a week  . Frequency of Social Gatherings with Friends and Family: More than three times a week  . Attends Religious Services: More than 4 times per year  . Active Member of Clubs or Organizations: Yes  . Attends Archivist Meetings: Never  . Marital Status: Married  Tobacco Counseling Counseling given: Not Answered   Clinical Intake:  Pre-visit preparation completed: Yes  Pain : No/denies pain     Nutritional Risks: None Diabetes: No  How often do you need to have someone help you when you read instructions, pamphlets, or other written materials from your doctor or pharmacy?: 1 - Never  Diabetic?No  Interpreter Needed?: No  Information entered by :: [facility] Daily [facility] In your present state of health, do you have any difficulty performing the following activities: 06/08/2020  Hearing? N  Vision? N  Difficulty concentrating or making decisions? N  Walking or climbing stairs? N  Dressing or bathing? N  Doing errands, shopping? N  Preparing Food and  eating ? N  Using the Toilet? N  In the past six months, have you accidently leaked urine? N  Do you have problems with loss of bowel control? N  Managing your Medications? N  Managing your Finances? N  Housekeeping or managing your Housekeeping? N  Some recent data might be hidden    Patient Care Team: Dorothyann Peng, NP as PCP - General (Family Medicine)  Indicate any recent Medical Services you may have received from other than [facility] providers in the past year (date may be approximate).     Assessment:   This is a routine wellness examination for [facility].  Hearing/Vision screen  Hearing Screening   125Hz  250Hz  500Hz  1000Hz  2000Hz  3000Hz  4000Hz  6000Hz  8000Hz   Right ear:           Left ear:           Vision Screening Comments: Gets eye exams once per year. Wears glasses   Dietary issues and exercise activities discussed: Current Exercise Habits: Home exercise routine, Type of exercise: walking;strength training/weights;stretching, Time (Minutes): > 60, Frequency (Times/Week): 5, Weekly Exercise (Minutes/Week): 0, Intensity: Moderate  Goals    . Patient Stated     I would like to maintain my current diet and level of fitness      Depression Screen PHQ 2/9 Scores 06/08/2020 11/03/2019  PHQ - 2 Score 0 0    Fall Risk Fall Risk  06/08/2020 11/03/2019  Falls in the past year? 0 0  Number falls in past yr: 0 -  Injury with Fall? 0 -  Risk for fall due to : No Fall Risks -  Follow up Falls evaluation completed;Falls prevention discussed -    FALL RISK PREVENTION PERTAINING TO THE HOME:  Any stairs in or around the home? Yes  If so, are there any without handrails? No  Home free of loose throw rugs in walkways, pet beds, electrical cords, etc? Yes  Adequate lighting in your home to reduce risk of falls? Yes   ASSISTIVE DEVICES UTILIZED TO PREVENT FALLS:  Life alert? No  Use of a cane, walker or w/c? No  Grab bars in the bathroom? No  Shower chair or bench in shower? No   Elevated toilet seat or a handicapped toilet? Yes   TIMED UP AND GO:  Was the test performed? Yes .  Length of time to ambulate 10 feet: 3 sec.   Gait steady and fast without use of assistive device  Cognitive Function:   Normal cognitive status assessed by direct observation by this Nurse Health Advisor. No abnormalities found.        Immunizations Immunization History  Administered Date(s) Administered  . Fluad Quad(high Dose 65+) 12/31/2018  . PFIZER(Purple Top)SARS-COV-2 Vaccination 05/02/2019, 05/31/2019, 03/24/2020  TDAP status: Due, Education has been provided regarding the importance of this vaccine. Advised may receive this vaccine at local pharmacy or Health Dept. Aware to provide a copy of the vaccination record if obtained from local pharmacy or Health Dept. Verbalized acceptance and understanding.  Flu Vaccine status: Declined, Education has been provided regarding the importance of this vaccine but patient still declined. Advised may receive this vaccine at local pharmacy or Health Dept. Aware to provide a copy of the vaccination record if obtained from local pharmacy or Health Dept. Verbalized acceptance and understanding.  Pneumococcal vaccine status: Declined,  Education has been provided regarding the importance of this vaccine but patient still declined. Advised may receive this vaccine at local pharmacy or Health Dept. Aware to provide a copy of the vaccination record if obtained from local pharmacy or Health Dept. Verbalized acceptance and understanding.   Covid-19 vaccine status: Completed vaccines  Qualifies for Shingles Vaccine? Yes   Zostavax completed No   Shingrix Completed?: No.    Education has been provided regarding the importance of this vaccine. Patient has been advised to call insurance company to determine out of pocket expense if they have not yet received this vaccine. Advised may also receive vaccine at local pharmacy or Health Dept.  Verbalized acceptance and understanding.  Screening Tests Health Maintenance  Topic Date Due  . Hepatitis C Screening  Never done  . INFLUENZA VACCINE  10/17/2019  . MAMMOGRAM  10/23/2019  . PNA vac Low Risk Adult (1 of 2 - PCV13) 11/02/2020 (Originally 12/21/2011)  . COLONOSCOPY (Pts 45-72yrs Insurance coverage will need to be confirmed)  08/05/2022  . DEXA SCAN  Completed  . COVID-19 Vaccine  Completed  . HPV VACCINES  Aged Out    Health Maintenance  Health Maintenance Due  Topic Date Due  . Hepatitis C Screening  Never done  . INFLUENZA VACCINE  10/17/2019  . MAMMOGRAM  10/23/2019    Colorectal cancer screening: Type of screening: Colonoscopy. Completed 08/04/2017. Repeat every 5 years  Mammogram status: Ordered 06/22/2019. Pt provided with contact info and advised to call to schedule appt.   Bone Density status: Completed 10/07/2018. Results reflect: Bone density results: OSTEOPENIA. Repeat every 5 years.  Lung Cancer Screening: (Low Dose CT Chest recommended if Age 70-80 years, 30 pack-year currently smoking OR have quit w/in 15years.) does not qualify.   Lung Cancer Screening Referral: N/A   Additional Screening:  Hepatitis C Screening: does qualify;   Vision Screening: Recommended annual ophthalmology exams for early detection of glaucoma and other disorders of the eye. Is the patient up to date with their annual eye exam?  Yes  Who is the provider or what is the name of the office in which the patient attends annual eye exams? Dr. Bufford Buttner If pt is not established with a provider, would they like to be referred to a provider to establish care? No .   Dental Screening: Recommended annual dental exams for proper oral hygiene  Community Resource Referral / Chronic Care Management: CRR required this visit?  No   CCM required this visit?  No      Plan:     I have personally reviewed and noted the following in the patient's chart:   . Medical and social  history . Use of alcohol, tobacco or illicit drugs  . Current medications and supplements . Functional ability and status . Nutritional status . Physical activity . Advanced directives . List of other physicians . Hospitalizations, surgeries,  and ER visits in previous 12 months . Vitals . Screenings to include cognitive, depression, and falls . Referrals and appointments  In addition, I have reviewed and discussed with patient certain preventive protocols, quality metrics, and best practice recommendations. A written personalized care plan for preventive services as well as general preventive health recommendations were provided to patient.     Ofilia Neas, LPN   [id number]   Nurse Notes: None

## 2020-06-08 ENCOUNTER — Ambulatory Visit (INDEPENDENT_AMBULATORY_CARE_PROVIDER_SITE_OTHER): Payer: Medicare HMO

## 2020-06-08 ENCOUNTER — Other Ambulatory Visit: Payer: Self-pay

## 2020-06-08 VITALS — BP 130/80 | HR 79 | Temp 98.6°F | Ht 65.0 in | Wt 122.0 lb

## 2020-06-08 DIAGNOSIS — Z0000 Encounter for general adult medical examination without abnormal findings: Secondary | ICD-10-CM

## 2020-06-08 NOTE — Patient Instructions (Signed)
Patricia Marshall , Thank you for taking time to come for your Medicare Wellness Visit. I appreciate your ongoing commitment to your health goals. Please review the following plan we discussed and let me know if I can assist you in the future.   Screening recommendations/referrals: Colonoscopy: No longer required  Mammogram: Currently due, please keep scheduled appointment Bone Density: Up to date, next due 10/07/2023 Recommended yearly ophthalmology/optometry visit for glaucoma screening and checkup Recommended yearly dental visit for hygiene and checkup  Vaccinations: Influenza vaccine: Currently due, you may await until fall 2022 Pneumococcal vaccine: Currently due, you may receive Prevnar 20 at your next visit  Tdap vaccine: Currently due, you may await and injury to receive as it is not covered by medicare  Shingles vaccine: Currently due if you would like to receive Shingrix, we recommend that you get at the pharmacy.    Advanced directives: Please bring in a copy of your advanced medical directives.   Conditions/risks identified: None   Next appointment: None    Preventive Care 65 Years and Older, Female Preventive care refers to lifestyle choices and visits with your health care provider that can promote health and wellness. What does preventive care include?  A yearly physical exam. This is also called an annual well check.  Dental exams once or twice a year.  Routine eye exams. Ask your health care provider how often you should have your eyes checked.  Personal lifestyle choices, including:  Daily care of your teeth and gums.  Regular physical activity.  Eating a healthy diet.  Avoiding tobacco and drug use.  Limiting alcohol use.  Practicing safe sex.  Taking low-dose aspirin every day.  Taking vitamin and mineral supplements as recommended by your health care provider. What happens during an annual well check? The services and screenings done by your health  care provider during your annual well check will depend on your age, overall health, lifestyle risk factors, and family history of disease. Counseling  Your health care provider may ask you questions about your:  Alcohol use.  Tobacco use.  Drug use.  Emotional well-being.  Home and relationship well-being.  Sexual activity.  Eating habits.  History of falls.  Memory and ability to understand (cognition).  Work and work Statistician.  Reproductive health. Screening  You may have the following tests or measurements:  Height, weight, and BMI.  Blood pressure.  Lipid and cholesterol levels. These may be checked every 5 years, or more frequently if you are over 11 years old.  Skin check.  Lung cancer screening. You may have this screening every year starting at age 39 if you have a 30-pack-year history of smoking and currently smoke or have quit within the past 15 years.  Fecal occult blood test (FOBT) of the stool. You may have this test every year starting at age 81.  Flexible sigmoidoscopy or colonoscopy. You may have a sigmoidoscopy every 5 years or a colonoscopy every 10 years starting at age 34.  Hepatitis C blood test.  Hepatitis B blood test.  Sexually transmitted disease (STD) testing.  Diabetes screening. This is done by checking your blood sugar (glucose) after you have not eaten for a while (fasting). You may have this done every 1-3 years.  Bone density scan. This is done to screen for osteoporosis. You may have this done starting at age 34.  Mammogram. This may be done every 1-2 years. Talk to your health care provider about how often you should have regular mammograms.  Talk with your health care provider about your test results, treatment options, and if necessary, the need for more tests. Vaccines  Your health care provider may recommend certain vaccines, such as:  Influenza vaccine. This is recommended every year.  Tetanus, diphtheria, and  acellular pertussis (Tdap, Td) vaccine. You may need a Td booster every 10 years.  Zoster vaccine. You may need this after age 73.  Pneumococcal 13-valent conjugate (PCV13) vaccine. One dose is recommended after age 100.  Pneumococcal polysaccharide (PPSV23) vaccine. One dose is recommended after age 66. Talk to your health care provider about which screenings and vaccines you need and how often you need them. This information is not intended to replace advice given to you by your health care provider. Make sure you discuss any questions you have with your health care provider. Document Released: 03/31/2015 Document Revised: 11/22/2015 Document Reviewed: 01/03/2015 Elsevier Interactive Patient Education  2017 [place] Prevention in the Home Falls can cause injuries. They can happen to people of all ages. There are many things you can do to make your home safe and to help prevent falls. What can I do on the outside of my home?  Regularly fix the edges of walkways and driveways and fix any cracks.  Remove anything that might make you trip as you walk through a door, such as a raised step or threshold.  Trim any bushes or trees on the path to your home.  Use bright outdoor lighting.  Clear any walking paths of anything that might make someone trip, such as rocks or tools.  Regularly check to see if handrails are loose or broken. Make sure that both sides of any steps have handrails.  Any raised decks and porches should have guardrails on the edges.  Have any leaves, snow, or ice cleared regularly.  Use sand or salt on walking paths during winter.  Clean up any spills in your garage right away. This includes oil or grease spills. What can I do in the bathroom?  Use night lights.  Install grab bars by the toilet and in the tub and shower. Do not use towel bars as grab bars.  Use non-skid mats or decals in the tub or shower.  If you need to sit down in the shower, use  a plastic, non-slip stool.  Keep the floor dry. Clean up any water that spills on the floor as soon as it happens.  Remove soap buildup in the tub or shower regularly.  Attach bath mats securely with double-sided non-slip rug tape.  Do not have throw rugs and other things on the floor that can make you trip. What can I do in the bedroom?  Use night lights.  Make sure that you have a light by your bed that is easy to reach.  Do not use any sheets or blankets that are too big for your bed. They should not hang down onto the floor.  Have a firm chair that has side arms. You can use this for support while you get dressed.  Do not have throw rugs and other things on the floor that can make you trip. What can I do in the kitchen?  Clean up any spills right away.  Avoid walking on wet floors.  Keep items that you use a lot in easy-to-reach places.  If you need to reach something above you, use a strong step stool that has a grab bar.  Keep electrical cords out of the way.  Do not use floor polish or wax that makes floors slippery. If you must use wax, use non-skid floor wax.  Do not have throw rugs and other things on the floor that can make you trip. What can I do with my stairs?  Do not leave any items on the stairs.  Make sure that there are handrails on both sides of the stairs and use them. Fix handrails that are broken or loose. Make sure that handrails are as long as the stairways.  Check any carpeting to make sure that it is firmly attached to the stairs. Fix any carpet that is loose or worn.  Avoid having throw rugs at the top or bottom of the stairs. If you do have throw rugs, attach them to the floor with carpet tape.  Make sure that you have a light switch at the top of the stairs and the bottom of the stairs. If you do not have them, ask someone to add them for you. What else can I do to help prevent falls?  Wear shoes that:  Do not have high heels.  Have  rubber bottoms.  Are comfortable and fit you well.  Are closed at the toe. Do not wear sandals.  If you use a stepladder:  Make sure that it is fully opened. Do not climb a closed stepladder.  Make sure that both sides of the stepladder are locked into place.  Ask someone to hold it for you, if possible.  Clearly mark and make sure that you can see:  Any grab bars or handrails.  First and last steps.  Where the edge of each step is.  Use tools that help you move around (mobility aids) if they are needed. These include:  Canes.  Walkers.  Scooters.  Crutches.  Turn on the lights when you go into a dark area. Replace any light bulbs as soon as they burn out.  Set up your furniture so you have a clear path. Avoid moving your furniture around.  If any of your floors are uneven, fix them.  If there are any pets around you, be aware of where they are.  Review your medicines with your doctor. Some medicines can make you feel dizzy. This can increase your chance of falling. Ask your doctor what other things that you can do to help prevent falls. This information is not intended to replace advice given to you by your health care provider. Make sure you discuss any questions you have with your health care provider. Document Released: 12/29/2008 Document Revised: 08/10/2015 Document Reviewed: 04/08/2014 Elsevier Interactive Patient Education  2017 [employer].

## 2020-07-06 ENCOUNTER — Ambulatory Visit
Admission: RE | Admit: 2020-07-06 | Discharge: 2020-07-06 | Disposition: A | Discharge status: Home / Self Care | Payer: Medicare HMO | Source: Ambulatory Visit | Attending: Family Medicine | Admitting: Family Medicine

## 2020-07-06 ENCOUNTER — Other Ambulatory Visit: Payer: Self-pay

## 2020-07-06 DIAGNOSIS — Z1231 Encounter for screening mammogram for malignant neoplasm of breast: Secondary | ICD-10-CM

## 2020-07-06 LAB — HM MAMMOGRAPHY

## 2020-07-07 ENCOUNTER — Encounter: Payer: Self-pay | Admitting: Adult Health

## 2020-10-12 DIAGNOSIS — M25562 Pain in left knee: Secondary | ICD-10-CM | POA: Diagnosis not present

## 2020-11-01 DIAGNOSIS — M25562 Pain in left knee: Secondary | ICD-10-CM | POA: Diagnosis not present

## 2020-11-16 DIAGNOSIS — M25562 Pain in left knee: Secondary | ICD-10-CM | POA: Diagnosis not present

## 2020-11-16 DIAGNOSIS — M1711 Unilateral primary osteoarthritis, right knee: Secondary | ICD-10-CM | POA: Diagnosis not present

## 2020-12-13 DIAGNOSIS — M9903 Segmental and somatic dysfunction of lumbar region: Secondary | ICD-10-CM | POA: Diagnosis not present

## 2020-12-13 DIAGNOSIS — M9905 Segmental and somatic dysfunction of pelvic region: Secondary | ICD-10-CM | POA: Diagnosis not present

## 2020-12-13 DIAGNOSIS — M9904 Segmental and somatic dysfunction of sacral region: Secondary | ICD-10-CM | POA: Diagnosis not present

## 2020-12-13 DIAGNOSIS — M25562 Pain in left knee: Secondary | ICD-10-CM | POA: Diagnosis not present

## 2020-12-13 DIAGNOSIS — M9906 Segmental and somatic dysfunction of lower extremity: Secondary | ICD-10-CM | POA: Diagnosis not present

## 2021-01-03 DIAGNOSIS — M9906 Segmental and somatic dysfunction of lower extremity: Secondary | ICD-10-CM | POA: Diagnosis not present

## 2021-01-03 DIAGNOSIS — M9905 Segmental and somatic dysfunction of pelvic region: Secondary | ICD-10-CM | POA: Diagnosis not present

## 2021-01-03 DIAGNOSIS — M25562 Pain in left knee: Secondary | ICD-10-CM | POA: Diagnosis not present

## 2021-01-03 DIAGNOSIS — M9904 Segmental and somatic dysfunction of sacral region: Secondary | ICD-10-CM | POA: Diagnosis not present

## 2021-01-03 DIAGNOSIS — M9903 Segmental and somatic dysfunction of lumbar region: Secondary | ICD-10-CM | POA: Diagnosis not present

## 2021-01-24 DIAGNOSIS — M25562 Pain in left knee: Secondary | ICD-10-CM | POA: Diagnosis not present

## 2021-01-24 DIAGNOSIS — M9903 Segmental and somatic dysfunction of lumbar region: Secondary | ICD-10-CM | POA: Diagnosis not present

## 2021-01-24 DIAGNOSIS — M9905 Segmental and somatic dysfunction of pelvic region: Secondary | ICD-10-CM | POA: Diagnosis not present

## 2021-01-24 DIAGNOSIS — M25552 Pain in left hip: Secondary | ICD-10-CM | POA: Diagnosis not present

## 2021-01-24 DIAGNOSIS — M9906 Segmental and somatic dysfunction of lower extremity: Secondary | ICD-10-CM | POA: Diagnosis not present

## 2021-02-06 DIAGNOSIS — M25552 Pain in left hip: Secondary | ICD-10-CM | POA: Diagnosis not present

## 2021-02-06 DIAGNOSIS — M9905 Segmental and somatic dysfunction of pelvic region: Secondary | ICD-10-CM | POA: Diagnosis not present

## 2021-02-06 DIAGNOSIS — M9904 Segmental and somatic dysfunction of sacral region: Secondary | ICD-10-CM | POA: Diagnosis not present

## 2021-02-06 DIAGNOSIS — M9906 Segmental and somatic dysfunction of lower extremity: Secondary | ICD-10-CM | POA: Diagnosis not present

## 2021-02-06 DIAGNOSIS — M25562 Pain in left knee: Secondary | ICD-10-CM | POA: Diagnosis not present

## 2021-02-06 DIAGNOSIS — M545 Low back pain, unspecified: Secondary | ICD-10-CM | POA: Diagnosis not present

## 2021-02-06 DIAGNOSIS — M9903 Segmental and somatic dysfunction of lumbar region: Secondary | ICD-10-CM | POA: Diagnosis not present

## 2021-02-22 ENCOUNTER — Ambulatory Visit (INDEPENDENT_AMBULATORY_CARE_PROVIDER_SITE_OTHER): Payer: Medicare HMO

## 2021-02-22 DIAGNOSIS — Z23 Encounter for immunization: Secondary | ICD-10-CM | POA: Diagnosis not present

## 2021-02-28 DIAGNOSIS — M9903 Segmental and somatic dysfunction of lumbar region: Secondary | ICD-10-CM | POA: Diagnosis not present

## 2021-02-28 DIAGNOSIS — M9906 Segmental and somatic dysfunction of lower extremity: Secondary | ICD-10-CM | POA: Diagnosis not present

## 2021-02-28 DIAGNOSIS — M9902 Segmental and somatic dysfunction of thoracic region: Secondary | ICD-10-CM | POA: Diagnosis not present

## 2021-02-28 DIAGNOSIS — M9905 Segmental and somatic dysfunction of pelvic region: Secondary | ICD-10-CM | POA: Diagnosis not present

## 2021-02-28 DIAGNOSIS — M25562 Pain in left knee: Secondary | ICD-10-CM | POA: Diagnosis not present

## 2021-02-28 DIAGNOSIS — M25552 Pain in left hip: Secondary | ICD-10-CM | POA: Diagnosis not present

## 2021-02-28 DIAGNOSIS — M9904 Segmental and somatic dysfunction of sacral region: Secondary | ICD-10-CM | POA: Diagnosis not present

## 2021-02-28 DIAGNOSIS — M545 Low back pain, unspecified: Secondary | ICD-10-CM | POA: Diagnosis not present

## 2021-03-07 DIAGNOSIS — L738 Other specified follicular disorders: Secondary | ICD-10-CM | POA: Diagnosis not present

## 2021-03-07 DIAGNOSIS — D2271 Melanocytic nevi of right lower limb, including hip: Secondary | ICD-10-CM | POA: Diagnosis not present

## 2021-03-07 DIAGNOSIS — D2272 Melanocytic nevi of left lower limb, including hip: Secondary | ICD-10-CM | POA: Diagnosis not present

## 2021-03-07 DIAGNOSIS — L918 Other hypertrophic disorders of the skin: Secondary | ICD-10-CM | POA: Diagnosis not present

## 2021-03-07 DIAGNOSIS — L308 Other specified dermatitis: Secondary | ICD-10-CM | POA: Diagnosis not present

## 2021-03-07 DIAGNOSIS — D1801 Hemangioma of skin and subcutaneous tissue: Secondary | ICD-10-CM | POA: Diagnosis not present

## 2021-03-07 DIAGNOSIS — L821 Other seborrheic keratosis: Secondary | ICD-10-CM | POA: Diagnosis not present

## 2021-03-07 DIAGNOSIS — L814 Other melanin hyperpigmentation: Secondary | ICD-10-CM | POA: Diagnosis not present

## 2021-03-28 DIAGNOSIS — M25552 Pain in left hip: Secondary | ICD-10-CM | POA: Diagnosis not present

## 2021-03-28 DIAGNOSIS — M545 Low back pain, unspecified: Secondary | ICD-10-CM | POA: Diagnosis not present

## 2021-03-28 DIAGNOSIS — M25562 Pain in left knee: Secondary | ICD-10-CM | POA: Diagnosis not present

## 2021-03-30 DIAGNOSIS — R262 Difficulty in walking, not elsewhere classified: Secondary | ICD-10-CM | POA: Diagnosis not present

## 2021-03-30 DIAGNOSIS — M25652 Stiffness of left hip, not elsewhere classified: Secondary | ICD-10-CM | POA: Diagnosis not present

## 2021-03-30 DIAGNOSIS — M25552 Pain in left hip: Secondary | ICD-10-CM | POA: Diagnosis not present

## 2021-04-02 DIAGNOSIS — R262 Difficulty in walking, not elsewhere classified: Secondary | ICD-10-CM | POA: Diagnosis not present

## 2021-04-02 DIAGNOSIS — M25652 Stiffness of left hip, not elsewhere classified: Secondary | ICD-10-CM | POA: Diagnosis not present

## 2021-04-02 DIAGNOSIS — M25552 Pain in left hip: Secondary | ICD-10-CM | POA: Diagnosis not present

## 2021-04-05 DIAGNOSIS — R262 Difficulty in walking, not elsewhere classified: Secondary | ICD-10-CM | POA: Diagnosis not present

## 2021-04-05 DIAGNOSIS — M25652 Stiffness of left hip, not elsewhere classified: Secondary | ICD-10-CM | POA: Diagnosis not present

## 2021-04-05 DIAGNOSIS — M25552 Pain in left hip: Secondary | ICD-10-CM | POA: Diagnosis not present

## 2021-04-09 DIAGNOSIS — R262 Difficulty in walking, not elsewhere classified: Secondary | ICD-10-CM | POA: Diagnosis not present

## 2021-04-09 DIAGNOSIS — M25552 Pain in left hip: Secondary | ICD-10-CM | POA: Diagnosis not present

## 2021-04-09 DIAGNOSIS — M25652 Stiffness of left hip, not elsewhere classified: Secondary | ICD-10-CM | POA: Diagnosis not present

## 2021-04-12 DIAGNOSIS — M25552 Pain in left hip: Secondary | ICD-10-CM | POA: Diagnosis not present

## 2021-04-12 DIAGNOSIS — R262 Difficulty in walking, not elsewhere classified: Secondary | ICD-10-CM | POA: Diagnosis not present

## 2021-04-12 DIAGNOSIS — M25652 Stiffness of left hip, not elsewhere classified: Secondary | ICD-10-CM | POA: Diagnosis not present

## 2021-04-16 DIAGNOSIS — M25652 Stiffness of left hip, not elsewhere classified: Secondary | ICD-10-CM | POA: Diagnosis not present

## 2021-04-16 DIAGNOSIS — R262 Difficulty in walking, not elsewhere classified: Secondary | ICD-10-CM | POA: Diagnosis not present

## 2021-04-16 DIAGNOSIS — M25552 Pain in left hip: Secondary | ICD-10-CM | POA: Diagnosis not present

## 2021-04-19 DIAGNOSIS — M25652 Stiffness of left hip, not elsewhere classified: Secondary | ICD-10-CM | POA: Diagnosis not present

## 2021-04-19 DIAGNOSIS — M25552 Pain in left hip: Secondary | ICD-10-CM | POA: Diagnosis not present

## 2021-04-19 DIAGNOSIS — R262 Difficulty in walking, not elsewhere classified: Secondary | ICD-10-CM | POA: Diagnosis not present

## 2021-04-23 DIAGNOSIS — M25652 Stiffness of left hip, not elsewhere classified: Secondary | ICD-10-CM | POA: Diagnosis not present

## 2021-04-23 DIAGNOSIS — M25552 Pain in left hip: Secondary | ICD-10-CM | POA: Diagnosis not present

## 2021-04-23 DIAGNOSIS — R262 Difficulty in walking, not elsewhere classified: Secondary | ICD-10-CM | POA: Diagnosis not present

## 2021-04-25 DIAGNOSIS — M25552 Pain in left hip: Secondary | ICD-10-CM | POA: Diagnosis not present

## 2021-04-25 DIAGNOSIS — M25652 Stiffness of left hip, not elsewhere classified: Secondary | ICD-10-CM | POA: Diagnosis not present

## 2021-04-25 DIAGNOSIS — R262 Difficulty in walking, not elsewhere classified: Secondary | ICD-10-CM | POA: Diagnosis not present

## 2021-04-26 DIAGNOSIS — H2513 Age-related nuclear cataract, bilateral: Secondary | ICD-10-CM | POA: Diagnosis not present

## 2021-04-26 DIAGNOSIS — H5203 Hypermetropia, bilateral: Secondary | ICD-10-CM | POA: Diagnosis not present

## 2021-07-09 ENCOUNTER — Encounter: Payer: Self-pay | Admitting: Internal Medicine

## 2021-07-09 ENCOUNTER — Ambulatory Visit (INDEPENDENT_AMBULATORY_CARE_PROVIDER_SITE_OTHER): Payer: Medicare HMO | Admitting: Internal Medicine

## 2021-07-09 VITALS — BP 124/80 | HR 74 | Temp 99.2°F | Ht 65.0 in | Wt 120.6 lb

## 2021-07-09 DIAGNOSIS — H1033 Unspecified acute conjunctivitis, bilateral: Secondary | ICD-10-CM | POA: Diagnosis not present

## 2021-07-09 DIAGNOSIS — R059 Cough, unspecified: Secondary | ICD-10-CM | POA: Diagnosis not present

## 2021-07-09 DIAGNOSIS — R058 Other specified cough: Secondary | ICD-10-CM | POA: Diagnosis not present

## 2021-07-09 LAB — POCT INFLUENZA A/B
Influenza A, POC: NEGATIVE
Influenza B, POC: NEGATIVE

## 2021-07-09 LAB — POC COVID19 BINAXNOW: SARS Coronavirus 2 Ag: NEGATIVE

## 2021-07-09 MED ORDER — POLYMYXIN B-TRIMETHOPRIM 10000-0.1 UNIT/ML-% OP SOLN: 1.0000 [drp] | Freq: Four times a day (QID) | OPHTHALMIC | 0 refills | Status: DC

## 2021-07-09 MED ORDER — PROMETHAZINE-DM 6.25-15 MG/5ML PO SYRP: 5.0000 mL | ORAL_SOLUTION | Freq: Four times a day (QID) | ORAL | 0 refills | Status: DC | PRN

## 2021-07-09 NOTE — Patient Instructions (Signed)
Exam is reassuring today.  ? ?Can use eye drops after warm compresses  for 5-7 days. ? ?Cough suppressant at night for comfort  ?Warm liquids  hot tea and honey can  humidify airway and help cough .  ? ?If fever  worsening   or failure to improve by end of week  contact medical team.  ?

## 2021-07-09 NOTE — Progress Notes (Signed)
? ?Chief Complaint  ?Patient presents with  ? Cough  ?  Worst at night   ? ? ?HPI: ?Patricia Marshall 76 y.o. come in for  acute   sx   ?Onset about 10 days ago  ( April 15 - 16?) after return from [place] event  developed  laryngitis and st  sx  ; did   home care gargles and asa   and then developed    coughing  in evening  a lot.  ? ?Dry cough      hyper salivation.   Sweats at some point.  At night.  No fever   on going  cough is bad.   Last night  right side  pain ? From pulled  chest wall no sure.  And eyes are red . Blood shot   crusted.  Does not wear contacts no major vision change ?Nose and throat feels dry especially at night. ?No difficulty swallowing in day appetite might be slightly decreased husband has not been sick although may be getting a sore throat today. ? ? ? ?ROS: See pertinent positives and negatives per HPI.  No shortness of breath hemoptysis pleurisy. ? ?Past Medical History:  ?Diagnosis Date  ? Chicken pox   ? High cholesterol   ? Vitamin D deficiency   ? ? ?Family History  ?Problem Relation Age of Onset  ? Stomach cancer Mother 48  ? Heart attack Father 56  ? Cancer Sister 79  ?     adenocarcinoma  ? Heart attack Brother 37  ? Heart attack Brother 43  ? ? ?Social History  ? ?Socioeconomic History  ? Marital status: Married  ?  Spouse name: Not on file  ? Number of children: Not on file  ? Years of education: Not on file  ? Highest education level: Not on file  ?Occupational History  ? Not on file  ?Tobacco Use  ? Smoking status: Never  ? Smokeless tobacco: Never  ?Substance and Sexual Activity  ? Alcohol use: Not on file  ? Drug use: Never  ? Sexual activity: Not on file  ?Other Topics Concern  ? Not on file  ?Social History Narrative  ? Married   ? Retired   ? ?Social Determinants of Health  ? ?Financial Resource Strain: Not on file  ?Food Insecurity: Not on file  ?Transportation Needs: Not on file  ?Physical Activity: Not on file  ?Stress: Not on file  ?Social Connections: Not on  file  ? ? ?Outpatient Medications Prior to Visit  ?Medication Sig Dispense Refill  ? Cholecalciferol (VITAMIN D) 50 MCG (2000 UT) tablet Take 2,000 Units by mouth daily.    ? ?No facility-administered medications prior to visit.  ? ? ? ?EXAM: ? ?BP 124/80 (BP Location: Left Arm, Patient Position: Sitting, Cuff Size: Normal)   Pulse 74   Temp 99.2 ?F (37.3 ?C) (Oral)   Ht '5\' 5"'$  (1.651 m)   Wt 120 lb 9.6 oz (54.7 kg)   SpO2 99%   BMI 20.07 kg/m?  ? ?Body mass index is 20.07 kg/m?. ?Did not cough throughout the exam ?GENERAL: vitals reviewed and listed above, alert, oriented, appears well hydrated and in no acute distress ?HEENT: atraumatic, conjunctiva +1 red no discharge seen but look watery no obvious abnormalities on inspection of external nose and ears TMs are clear OP : no lesion edema or exudate face without swelling nontender ?NECK: no obvious masses on inspection palpation  ?LUNGS: clear to auscultation bilaterally,  no wheezes, rales or rhonchi, good air movement voice is normal no stridor ?CV: HRRR, no clubbing cyanosis or  peripheral edema nl cap refill  ?Abdomen soft without again megaly guarding or rebound ?MS: moves all extremities without noticeable focal  abnormality ?PSYCH: pleasant and cooperative, no obvious depression or anxiety ?Lab Results  ?Component Value Date  ? WBC 4.3 11/03/2019  ? HGB 13.9 11/03/2019  ? HCT 42.4 11/03/2019  ? PLT 186 11/03/2019  ? GLUCOSE 83 11/03/2019  ? CHOL 226 (H) 11/03/2019  ? TRIG 80 11/03/2019  ? HDL 67 11/03/2019  ? LDLCALC 142 (H) 11/03/2019  ? ALT 11 11/03/2019  ? AST 17 11/03/2019  ? NA 138 11/03/2019  ? K 4.7 11/03/2019  ? CL 101 11/03/2019  ? CREATININE 0.76 11/03/2019  ? BUN 19 11/03/2019  ? CO2 27 11/03/2019  ? TSH 2.17 11/03/2019  ? ?BP Readings from Last 3 Encounters:  ?07/09/21 124/80  ?06/08/20 130/80  ?11/03/19 130/80  ?Rapid flu and COVID is negative ? ?ASSESSMENT AND PLAN: ? ?Discussed the following assessment and plan: ? ?Cough, unspecified type  - Plan: POC COVID-19 BinaxNow, POCT Influenza A/B ? ?Respiratory tract congestion with cough ? ?Acute conjunctivitis of both eyes, unspecified acute conjunctivitis type ? cough apparently quite bad illness is consistent with an evolving viral respiratory infection and with the eye findings question adenovirus or other.  Because of the crusting will add antibiotic eyedrop after compresses. ?No signs of pneumonia at this time expectant management options discussed for cough comfort ?We will send in cough medicine follow-up if persistent progressive or alarm findings. ?-Patient advised to return or notify health care team  if  new concerns arise. ? ?Patient Instructions  ?Exam is reassuring today.  ? ?Can use eye drops after warm compresses  for 5-7 days. ? ?Cough suppressant at night for comfort  ?Warm liquids  hot tea and honey can  humidify airway and help cough .  ? ?If fever  worsening   or failure to improve by end of week  contact medical team.  ? ? ?Standley Brooking. Merrel Crabbe M.D. ? ?

## 2021-08-16 ENCOUNTER — Other Ambulatory Visit: Payer: Self-pay | Admitting: Adult Health

## 2021-08-16 DIAGNOSIS — Z1231 Encounter for screening mammogram for malignant neoplasm of breast: Secondary | ICD-10-CM

## 2021-08-27 ENCOUNTER — Telehealth: Payer: Self-pay | Admitting: Adult Health

## 2021-08-27 NOTE — Telephone Encounter (Signed)
Spoke with patient to schedule AWV.  She stated she wanted to think about and she would call back to schedule.

## 2021-09-03 ENCOUNTER — Ambulatory Visit (INDEPENDENT_AMBULATORY_CARE_PROVIDER_SITE_OTHER): Payer: Medicare HMO

## 2021-09-03 VITALS — BP 118/60 | HR 61 | Temp 98.5°F | Ht 65.0 in | Wt 121.4 lb

## 2021-09-03 DIAGNOSIS — Z0000 Encounter for general adult medical examination without abnormal findings: Secondary | ICD-10-CM | POA: Diagnosis not present

## 2021-09-03 NOTE — Progress Notes (Signed)
Subjective:   Patricia Marshall is a 76 y.o. female who presents for Medicare Annual (Subsequent) preventive examination.  Review of Systems     Cardiac Risk Factors include: advanced age (>82mn, >>25women)     Objective:    Today's Vitals   09/03/21 1127  BP: 118/60  Pulse: 61  Temp: 98.5 F (36.9 C)  TempSrc: Oral  SpO2: 99%  Weight: 121 lb 6.4 oz (55.1 kg)  Height: '5\' 5"'$  (1.651 m)   Body mass index is 20.2 kg/m.     09/03/2021   11:36 AM 06/08/2020    2:17 PM 08/31/2018    1:00 PM  Advanced Directives  Does Patient Have a Medical Advance Directive? Yes Yes Yes  Type of AParamedicof ACooleemeeLiving will [place]enning[place]Living will Living will  Does patient want to make changes to medical advance directive? No - Patient declined No - Patient declined No - Patient declined  Copy of HGardenin Chart? No - copy requested No - copy requested     Current Medications (verified) Outpatient Encounter Medications as of 09/03/2021  Medication Sig   Cholecalciferol (VITAMIN D) 50 MCG (2000 UT) tablet Take 2,000 Units by mouth daily.   promethazine-dextromethorphan (PROMETHAZINE-DM) 6.25-15 MG/5ML syrup Take 5 mLs by mouth 4 (four) times daily as needed for cough.   trimethoprim-polymyxin b (POLYTRIM) ophthalmic solution Place 1 drop into both eyes every 6 (six) hours.   No facility-administered encounter medications on file as of 09/03/2021.    Allergies (verified) Ampicillin   History: Past Medical History:  Diagnosis Date   Chicken pox    High cholesterol    Vitamin D deficiency    Past Surgical History:  Procedure Laterality Date   APPENDECTOMY  1959   BACK SURGERY     1996   TONSILLECTOMY AND ADENOIDECTOMY  1954   Family History  Problem Relation Age of Onset   Stomach cancer Mother 766  Heart attack Father 859  Cancer Sister 619      adenocarcinoma   Heart attack Brother 873  Heart attack  Brother 648  Social History   Socioeconomic History   Marital status: Married    Spouse name: Not on file   Number of children: Not on file   Years of education: Not on file   Highest education level: Not on file  Occupational History   Not on file  Tobacco Use   Smoking status: Never   Smokeless tobacco: Never  Substance and Sexual Activity   Alcohol use: Not on file   Drug use: Never   Sexual activity: Not on file  Other Topics Concern   Not on file  Social History Narrative   Married    Retired    SInvestment banker, operationalof HRadio broadcast assistantStrain: [place] (09/03/2021)   Overall Financial Resource Strain (CARDIA)    Difficulty of Paying Living Expenses: Not hard at all  Food Insecurity: No FDresden(09/03/2021)   Hunger Vital Sign    Worried About Running Out of Food in the Last Year: Never true    RMontrosein the Last Year: Never true  Transportation Needs: No Transportation Needs (09/03/2021)   PRAPARE - THydrologist(Medical): No    Lack of Transportation (Non-Medical): No  Physical Activity: Sufficiently Active (09/03/2021)   Exercise Vital Sign    Days of  Exercise per Week: 4 days    Minutes of Exercise per Session: 120 min  Stress: No Stress Concern Present (09/03/2021)   [place]    Feeling of Stress : Not at all  Social Connections: [place] (09/03/2021)   Social Connection and Isolation Panel [NHANES]    Frequency of Communication with Friends and Family: More than three times a week    Frequency of Social Gatherings with Friends and Family: More than three times a week    Attends Religious Services: More than 4 times per year    Active Member of [employer] or Organizations: Yes    Attends Music therapist: More than 4 times per year    Marital Status: Married    Tobacco Counseling Counseling given: Not  Answered   Clinical Intake:   How often do you need to have someone help you when you read instructions, pamphlets, or other written materials from your doctor or pharmacy?: 1 - Never  Diabetic? No  Interpreter Needed?: No Activities of Daily Living    09/03/2021   11:35 AM  In your present state of health, do you have any difficulty performing the following activities:  Hearing? 0  Vision? 0  Difficulty concentrating or making decisions? 0  Walking or climbing stairs? 0  Dressing or bathing? 0  Doing errands, shopping? 0  Preparing Food and eating ? N  Using the Toilet? N  In the past six months, have you accidently leaked urine? N  Do you have problems with loss of bowel control? N  Managing your Medications? N  Managing your Finances? N  Housekeeping or managing your Housekeeping? N    Patient Care Team: Dorothyann Peng, NP as PCP - General (Family Medicine)  Indicate any recent Medical Services you may have received from other than [facility] providers in the past year (date may be approximate).     Assessment:   This is a routine wellness examination for [facility].  Hearing/Vision screen Hearing Screening - Comments:: No hearing difficulty Vision Screening - Comments:: Wears glasses. Followed by Dr Sabra Heck  Dietary issues and exercise activities discussed: Exercise limited by: None identified   Goals Addressed               This Visit's Progress     Patient Stated (pt-stated)        I would like to maintain my current diet and level of fitness.       Depression Screen    09/03/2021   11:32 AM 07/09/2021   10:15 AM 06/08/2020    2:19 PM 11/03/2019    8:34 AM  PHQ 2/9 Scores  PHQ - 2 Score 0 0 0 0  PHQ- 9 Score 0 3      Fall Risk    09/03/2021   11:35 AM 07/09/2021   10:15 AM 06/08/2020    2:18 PM 11/03/2019    8:34 AM  Fall Risk   Falls in the past year? 0 0 0 0  Number falls in past yr: 0 0 0   Injury with Fall? 0 0 0   Risk for fall due to : No Fall  Risks No Fall Risks No Fall Risks   Follow up  Falls evaluation completed Falls evaluation completed;Falls prevention discussed     FALL RISK PREVENTION PERTAINING TO THE HOME:  Any stairs in or around the home? Yes  If so, are there any without handrails? No  Home  free of loose throw rugs in walkways, pet beds, electrical cords, etc? Yes  Adequate lighting in your home to reduce risk of falls? Yes   ASSISTIVE DEVICES UTILIZED TO PREVENT FALLS:  Life alert? No  Use of a cane, walker or w/c? No  Grab bars in the bathroom? No  Shower chair or bench in shower? No  Elevated toilet seat or a handicapped toilet? No   TIMED UP AND GO:  Was the test performed? Yes .  Length of time to ambulate 10 feet: 5 sec.   Gait steady and fast without use of assistive device  Cognitive Function:        09/03/2021   11:36 AM  6CIT Screen  What Year? 0 points  What month? 0 points  What time? 0 points  Count back from 20 0 points  Months in reverse 0 points  Repeat phrase 0 points  Total Score 0 points    Immunizations Immunization History  Administered Date(s) Administered   Fluad Quad(high Dose 65+) 12/31/2018, 02/22/2021   PFIZER(Purple Top)SARS-COV-2 Vaccination 05/02/2019, 05/31/2019, 03/24/2020      Flu Vaccine status: Up to date    Covid-19 vaccine status: Completed vaccines  Qualifies for Shingles Vaccine? Yes   Zostavax completed No   Shingrix Completed?: No.    Education has been provided regarding the importance of this vaccine. Patient has been advised to call insurance company to determine out of pocket expense if they have not yet received this vaccine. Advised may also receive vaccine at local pharmacy or Health Dept. Verbalized acceptance and understanding.  Screening Tests Health Maintenance  Topic Date Due   Pneumonia Vaccine 80+ Years old (1 - PCV) 01/08/2022 (Originally 12/21/2011)   COVID-19 Vaccine (4 - Pfizer series) 01/08/2022 (Originally 05/19/2020)    Hepatitis C Screening  01/08/2022 (Originally 12/20/1963)   Zoster Vaccines- Shingrix (1 of 2) 01/08/2022 (Originally 12/20/1996)   INFLUENZA VACCINE  10/16/2021   MAMMOGRAM  07/07/2022   COLONOSCOPY (Pts 45-60yr Insurance coverage will need to be confirmed)  08/05/2022   DEXA SCAN  Completed   HPV VACCINES  Aged Out    Health Maintenance  There are no preventive care reminders to display for this patient.  Colorectal cancer screening: Type of screening: Colonoscopy. Completed 08/04/17. Repeat every 5 years  Mammogram status: Completed 07/06/20. Repeat every year  Bone Density status: Completed 10/07/18. Results reflect: Bone density results: OSTEOPENIA. Repeat every   years.  Lung Cancer Screening: (Low Dose CT Chest recommended if Age 75-80years, 30 pack-year currently smoking OR have quit w/in 15years.)  qualify.     Additional Screening:  Hepatitis C Screening: does qualify; Completed Patient deferred  Vision Screening: Recommended annual ophthalmology exams for early detection of glaucoma and other disorders of the eye. Is the patient up to date with their annual eye exam?  Yes  Who is the provider or what is the name of the office in which the patient attends annual eye exams? Dr MSabra HeckIf pt is not established with a provider, would they like to be referred to a provider to establish care? No .   Dental Screening: Recommended annual dental exams for proper oral hygiene  Community Resource Referral / Chronic Care Management:   CRR required this visit?  No   CCM required this visit?  No      Plan:     I have personally reviewed and noted the following in the patient's chart:   Medical and social history Use of  alcohol, tobacco or illicit drugs  Current medications and supplements including opioid prescriptions.  Functional ability and status Nutritional status Physical activity Advanced directives List of other physicians Hospitalizations, surgeries, and ER  visits in previous 12 months Vitals Screenings to include cognitive, depression, and falls Referrals and appointments  In addition, I have reviewed and discussed with patient certain preventive protocols, quality metrics, and best practice recommendations. A written personalized care plan for preventive services as well as general preventive health recommendations were provided to patient.     Criselda Peaches, LPN   [id number]   Nurse Notes: None

## 2021-09-03 NOTE — Patient Instructions (Signed)
[name] , Thank you for taking time to come for your Medicare Wellness Visit. I appreciate your ongoing commitment to your health goals. Please review the following plan we discussed and let me know if I can assist you in the future.   These are the goals we discussed:  Goals      Patient Stated     I would like to maintain my current diet and level of fitness        This is a list of the screening recommended for you and due dates:  Health Maintenance  Topic Date Due   Pneumonia Vaccine (1 - PCV) 01/08/2022*   COVID-19 Vaccine (4 - Pfizer series) 01/08/2022*   Hepatitis C Screening: USPSTF Recommendation to screen - Ages 18-79 yo.  01/08/2022*   Zoster (Shingles) Vaccine (1 of 2) 01/08/2022*   Flu Shot  10/16/2021   Mammogram  07/07/2022   Colon Cancer Screening  08/05/2022   DEXA scan (bone density measurement)  Completed   HPV Vaccine  Aged Out  *Topic was postponed. The date shown is not the original due date.    Advanced directives: Yes  Conditions/risks identified: None  Next appointment: Follow up in one year for your annual wellness visit    Preventive Care 65 Years and Older, Female Preventive care refers to lifestyle choices and visits with your health care provider that can promote health and wellness. What does preventive care include? A yearly physical exam. This is also called an annual well check. Dental exams once or twice a year. Routine eye exams. Ask your health care provider how often you should have your eyes checked. Personal lifestyle choices, including: Daily care of your teeth and gums. Regular physical activity. Eating a healthy diet. Avoiding tobacco and drug use. Limiting alcohol use. Practicing safe sex. Taking low-dose aspirin every day. Taking vitamin and mineral supplements as recommended by your health care provider. What happens during an annual well check? The services and screenings done by your health care provider during  your annual well check will depend on your age, overall health, lifestyle risk factors, and family history of disease. Counseling  Your health care provider may ask you questions about your: Alcohol use. Tobacco use. Drug use. Emotional well-being. Home and relationship well-being. Sexual activity. Eating habits. History of falls. Memory and ability to understand (cognition). Work and work Statistician. Reproductive health. Screening  You may have the following tests or measurements: Height, weight, and BMI. Blood pressure. Lipid and cholesterol levels. These may be checked every 5 years, or more frequently if you are over 66 years old. Skin check. Lung cancer screening. You may have this screening every year starting at age 54 if you have a 30-pack-year history of smoking and currently smoke or have quit within the past 15 years. Fecal occult blood test (FOBT) of the stool. You may have this test every year starting at age 32. Flexible sigmoidoscopy or colonoscopy. You may have a sigmoidoscopy every 5 years or a colonoscopy every 10 years starting at age 63. Hepatitis C blood test. Hepatitis B blood test. Sexually transmitted disease (STD) testing. Diabetes screening. This is done by checking your blood sugar (glucose) after you have not eaten for a while (fasting). You may have this done every 1-3 years. Bone density scan. This is done to screen for osteoporosis. You may have this done starting at age 44. Mammogram. This may be done every 1-2 years. Talk to your health care provider about how often  you should have regular mammograms. Talk with your health care provider about your test results, treatment options, and if necessary, the need for more tests. Vaccines  Your health care provider may recommend certain vaccines, such as: Influenza vaccine. This is recommended every year. Tetanus, diphtheria, and acellular pertussis (Tdap, Td) vaccine. You may need a Td booster every 10  years. Zoster vaccine. You may need this after age 71. Pneumococcal 13-valent conjugate (PCV13) vaccine. One dose is recommended after age 58. Pneumococcal polysaccharide (PPSV23) vaccine. One dose is recommended after age 15. Talk to your health care provider about which screenings and vaccines you need and how often you need them. This information is not intended to replace advice given to you by your health care provider. Make sure you discuss any questions you have with your health care provider. Document Released: 03/31/2015 Document Revised: 11/22/2015 Document Reviewed: 01/03/2015 Elsevier Interactive Patient Education  2017 [place] Prevention in the Home Falls can cause injuries. They can happen to people of all ages. There are many things you can do to make your home safe and to help prevent falls. What can I do on the outside of my home? Regularly fix the edges of walkways and driveways and fix any cracks. Remove anything that might make you trip as you walk through a door, such as a raised step or threshold. Trim any bushes or trees on the path to your home. Use bright outdoor lighting. Clear any walking paths of anything that might make someone trip, such as rocks or tools. Regularly check to see if handrails are loose or broken. Make sure that both sides of any steps have handrails. Any raised decks and porches should have guardrails on the edges. Have any leaves, snow, or ice cleared regularly. Use sand or salt on walking paths during winter. Clean up any spills in your garage right away. This includes oil or grease spills. What can I do in the bathroom? Use night lights. Install grab bars by the toilet and in the tub and shower. Do not use towel bars as grab bars. Use non-skid mats or decals in the tub or shower. If you need to sit down in the shower, use a plastic, non-slip stool. Keep the floor dry. Clean up any water that spills on the floor as soon as it  happens. Remove soap buildup in the tub or shower regularly. Attach bath mats securely with double-sided non-slip rug tape. Do not have throw rugs and other things on the floor that can make you trip. What can I do in the bedroom? Use night lights. Make sure that you have a light by your bed that is easy to reach. Do not use any sheets or blankets that are too big for your bed. They should not hang down onto the floor. Have a firm chair that has side arms. You can use this for support while you get dressed. Do not have throw rugs and other things on the floor that can make you trip. What can I do in the kitchen? Clean up any spills right away. Avoid walking on wet floors. Keep items that you use a lot in easy-to-reach places. If you need to reach something above you, use a strong step stool that has a grab bar. Keep electrical cords out of the way. Do not use floor polish or wax that makes floors slippery. If you must use wax, use non-skid floor wax. Do not have throw rugs and other things on  the floor that can make you trip. What can I do with my stairs? Do not leave any items on the stairs. Make sure that there are handrails on both sides of the stairs and use them. Fix handrails that are broken or loose. Make sure that handrails are as long as the stairways. Check any carpeting to make sure that it is firmly attached to the stairs. Fix any carpet that is loose or worn. Avoid having throw rugs at the top or bottom of the stairs. If you do have throw rugs, attach them to the floor with carpet tape. Make sure that you have a light switch at the top of the stairs and the bottom of the stairs. If you do not have them, ask someone to add them for you. What else can I do to help prevent falls? Wear shoes that: Do not have high heels. Have rubber bottoms. Are comfortable and fit you well. Are closed at the toe. Do not wear sandals. If you use a stepladder: Make sure that it is fully opened.  Do not climb a closed stepladder. Make sure that both sides of the stepladder are locked into place. Ask someone to hold it for you, if possible. Clearly mark and make sure that you can see: Any grab bars or handrails. First and last steps. Where the edge of each step is. Use tools that help you move around (mobility aids) if they are needed. These include: Canes. Walkers. Scooters. Crutches. Turn on the lights when you go into a dark area. Replace any light bulbs as soon as they burn out. Set up your furniture so you have a clear path. Avoid moving your furniture around. If any of your floors are uneven, fix them. If there are any pets around you, be aware of where they are. Review your medicines with your doctor. Some medicines can make you feel dizzy. This can increase your chance of falling. Ask your doctor what other things that you can do to help prevent falls. This information is not intended to replace advice given to you by your health care provider. Make sure you discuss any questions you have with your health care provider. Document Released: 12/29/2008 Document Revised: 08/10/2015 Document Reviewed: 04/08/2014 Elsevier Interactive Patient Education  2017 [employer].

## 2021-09-05 ENCOUNTER — Telehealth: Payer: Self-pay | Admitting: Adult Health

## 2021-09-05 NOTE — Telephone Encounter (Signed)
Patient called wanting to speak with beverly   She had question about after visit summary  According to what Rise Paganini gave  she is due for 07/07/2022 for mammogram    She has an appointment schedule for mammogram  09/20/21 with [facility]   Is this ok or did medicare change requirements.  Will medicare pay for this if she does this in July  Please advise

## 2021-09-20 ENCOUNTER — Ambulatory Visit
Admission: RE | Admit: 2021-09-20 | Discharge: 2021-09-20 | Disposition: A | Discharge status: Home / Self Care | Payer: Medicare HMO | Source: Ambulatory Visit | Attending: Adult Health | Admitting: Adult Health

## 2021-09-20 DIAGNOSIS — Z1231 Encounter for screening mammogram for malignant neoplasm of breast: Secondary | ICD-10-CM

## 2021-09-21 ENCOUNTER — Other Ambulatory Visit: Payer: Self-pay | Admitting: Adult Health

## 2021-09-21 DIAGNOSIS — R928 Other abnormal and inconclusive findings on diagnostic imaging of breast: Secondary | ICD-10-CM

## 2021-10-01 ENCOUNTER — Ambulatory Visit
Admission: RE | Admit: 2021-10-01 | Discharge: 2021-10-01 | Disposition: A | Discharge status: Home / Self Care | Payer: Medicare HMO | Source: Ambulatory Visit | Attending: Adult Health | Admitting: Adult Health

## 2021-10-01 ENCOUNTER — Other Ambulatory Visit: Payer: Self-pay | Admitting: Adult Health

## 2021-10-01 DIAGNOSIS — R928 Other abnormal and inconclusive findings on diagnostic imaging of breast: Secondary | ICD-10-CM

## 2021-10-01 DIAGNOSIS — N6320 Unspecified lump in the left breast, unspecified quadrant: Secondary | ICD-10-CM

## 2021-10-01 DIAGNOSIS — N6322 Unspecified lump in the left breast, upper inner quadrant: Secondary | ICD-10-CM | POA: Diagnosis not present

## 2021-10-01 DIAGNOSIS — R922 Inconclusive mammogram: Secondary | ICD-10-CM | POA: Diagnosis not present

## 2021-10-04 ENCOUNTER — Ambulatory Visit
Admission: RE | Admit: 2021-10-04 | Discharge: 2021-10-04 | Disposition: A | Discharge status: Home / Self Care | Payer: Medicare HMO | Source: Ambulatory Visit | Attending: Adult Health | Admitting: Adult Health

## 2021-10-04 DIAGNOSIS — N6320 Unspecified lump in the left breast, unspecified quadrant: Secondary | ICD-10-CM

## 2021-10-04 DIAGNOSIS — C50212 Malignant neoplasm of upper-inner quadrant of left female breast: Secondary | ICD-10-CM | POA: Diagnosis not present

## 2021-10-04 DIAGNOSIS — N62 Hypertrophy of breast: Secondary | ICD-10-CM | POA: Diagnosis not present

## 2021-10-04 DIAGNOSIS — N6322 Unspecified lump in the left breast, upper inner quadrant: Secondary | ICD-10-CM | POA: Diagnosis not present

## 2021-10-04 DIAGNOSIS — R2232 Localized swelling, mass and lump, left upper limb: Secondary | ICD-10-CM | POA: Diagnosis not present

## 2021-10-09 ENCOUNTER — Encounter: Payer: Self-pay | Admitting: Adult Health

## 2021-10-09 ENCOUNTER — Ambulatory Visit (INDEPENDENT_AMBULATORY_CARE_PROVIDER_SITE_OTHER): Payer: Medicare HMO | Admitting: Adult Health

## 2021-10-09 VITALS — BP 130/70 | HR 53 | Temp 98.7°F | Ht 65.0 in | Wt 118.0 lb

## 2021-10-09 DIAGNOSIS — Z1159 Encounter for screening for other viral diseases: Secondary | ICD-10-CM | POA: Diagnosis not present

## 2021-10-09 DIAGNOSIS — E785 Hyperlipidemia, unspecified: Secondary | ICD-10-CM | POA: Diagnosis not present

## 2021-10-09 DIAGNOSIS — Z0000 Encounter for general adult medical examination without abnormal findings: Secondary | ICD-10-CM

## 2021-10-09 DIAGNOSIS — M8580 Other specified disorders of bone density and structure, unspecified site: Secondary | ICD-10-CM

## 2021-10-09 DIAGNOSIS — C50912 Malignant neoplasm of unspecified site of left female breast: Secondary | ICD-10-CM | POA: Diagnosis not present

## 2021-10-09 DIAGNOSIS — E559 Vitamin D deficiency, unspecified: Secondary | ICD-10-CM | POA: Diagnosis not present

## 2021-10-09 LAB — CBC WITH DIFFERENTIAL/PLATELET
Basophils Absolute: 0 10*3/uL (ref 0.0–0.1)
Basophils Relative: 0.8 % (ref 0.0–3.0)
Eosinophils Absolute: 0 10*3/uL (ref 0.0–0.7)
Eosinophils Relative: 0.7 % (ref 0.0–5.0)
HCT: 42.5 % (ref 36.0–46.0)
Hemoglobin: 14 g/dL (ref 12.0–15.0)
Lymphocytes Relative: 37.4 % (ref 12.0–46.0)
Lymphs Abs: 2 10*3/uL (ref 0.7–4.0)
MCHC: 33 g/dL (ref 30.0–36.0)
MCV: 88.7 fl (ref 78.0–100.0)
Monocytes Absolute: 0.4 10*3/uL (ref 0.1–1.0)
Monocytes Relative: 8.2 % (ref 3.0–12.0)
Neutro Abs: 2.9 10*3/uL (ref 1.4–7.7)
Neutrophils Relative %: 52.9 % (ref 43.0–77.0)
Platelets: 192 10*3/uL (ref 150.0–400.0)
RBC: 4.79 Mil/uL (ref 3.87–5.11)
RDW: 13 % (ref 11.5–15.5)
WBC: 5.5 10*3/uL (ref 4.0–10.5)

## 2021-10-09 LAB — TSH: TSH: 2.55 u[IU]/mL (ref 0.35–5.50)

## 2021-10-09 LAB — COMPREHENSIVE METABOLIC PANEL
ALT: 10 U/L (ref 0–35)
AST: 18 U/L (ref 0–37)
Albumin: 4.8 g/dL (ref 3.5–5.2)
Alkaline Phosphatase: 95 U/L (ref 39–117)
BUN: 16 mg/dL (ref 6–23)
CO2: 26 mEq/L (ref 19–32)
Calcium: 10 mg/dL (ref 8.4–10.5)
Chloride: 101 mEq/L (ref 96–112)
Creatinine, Ser: 0.71 mg/dL (ref 0.40–1.20)
GFR: 83.54 mL/min (ref >60.00)
Glucose, Bld: 89 mg/dL (ref 70–99)
Potassium: 4.4 mEq/L (ref 3.5–5.1)
Sodium: 137 mEq/L (ref 135–145)
Total Bilirubin: 0.8 mg/dL (ref 0.2–1.2)
Total Protein: 7.6 g/dL (ref 6.0–8.3)

## 2021-10-09 LAB — LIPID PANEL
Cholesterol: 217 mg/dL — ABNORMAL HIGH (ref 0–200)
HDL: 63.9 mg/dL (ref >39.00)
LDL Cholesterol: 135 mg/dL — ABNORMAL HIGH (ref 0–99)
NonHDL: 153.35
Total CHOL/HDL Ratio: 3
Triglycerides: 94 mg/dL (ref 0.0–149.0)
VLDL: 18.8 mg/dL (ref 0.0–40.0)

## 2021-10-09 LAB — VITAMIN D 25 HYDROXY (VIT D DEFICIENCY, FRACTURES): VITD: 43.33 ng/mL (ref 30.00–100.00)

## 2021-10-09 NOTE — Progress Notes (Signed)
Subjective:    Patient ID: Patricia Marshall, female    DOB: 12/28/1945, 76 y.o.   MRN: [id number]  HPI Patient presents for yearly preventative medicine examination. She is a pleasant 76 year old female who  has a past medical history of Chicken pox, High cholesterol, and Vitamin D deficiency.  Breast Cancer -she was diagnosed last week with INVASIVE DUCTAL CARCINOMA, HER 2 + of left breast.  Currently working on getting her team together.  Will hopefully be meeting with surgery within the next 2 weeks to develop a plan of care.  She seems upbeat about the future and plans on fighting.  Vitamin D Deficiency - takes OTC vitamin D supplements  Last vitamin D Lab Results  Component Value Date   VD25OH 32 11/03/2019   Hyperlipidemia -not currently on medication.  It was recommended that she start a statin in 2021 due to 10-year cardiac risk score of 12%. Lab Results  Component Value Date   CHOL 226 (H) 11/03/2019   HDL 67 11/03/2019   LDLCALC 142 (H) 11/03/2019   TRIG 80 11/03/2019   CHOLHDL 3.4 11/03/2019   All immunizations and health maintenance protocols were reviewed with the patient and needed orders were placed.  Appropriate screening laboratory values were ordered for the patient including screening of hyperlipidemia, renal function and hepatic function.  Medication reconciliation,  past medical history, social history, problem list and allergies were reviewed in detail with the patient  Goals were established with regard to weight loss, exercise, and  diet in compliance with medications.  She continues to be very active playing pickle ball 3 times a week and participates in Silver sneakers twice a week.  He does do weightbearing exercises.  She does eat a heart healthy diet Wt Readings from Last 3 Encounters:  10/09/21 118 lb (53.5 kg)  09/03/21 121 lb 6.4 oz (55.1 kg)  07/09/21 120 lb 9.6 oz (54.7 kg)   She is up-to-date on routine colon cancer screening.  She is on  the 5-year plan, due in 2024.  She is up-to-date on routine mammograms.  He is due for bone density screen.  Her last bone density was done in July 2020 which showed osteopenia  Review of Systems  Constitutional: Negative.   HENT: Negative.    Eyes: Negative.   Respiratory: Negative.    Cardiovascular: Negative.   Gastrointestinal: Negative.   Endocrine: Negative.   Genitourinary: Negative.   Musculoskeletal: Negative.   Skin: Negative.   Allergic/Immunologic: Negative.   Neurological: Negative.   Hematological: Negative.   Psychiatric/Behavioral: Negative.     Past Medical History:  Diagnosis Date   Chicken pox    High cholesterol    Vitamin D deficiency     Social History   Socioeconomic History   Marital status: Married    Spouse name: Not on file   Number of children: Not on file   Years of education: Not on file   Highest education level: Not on file  Occupational History   Not on file  Tobacco Use   Smoking status: Never   Smokeless tobacco: Never  Substance and Sexual Activity   Alcohol use: Not on file   Drug use: Never   Sexual activity: Not on file  Other Topics Concern   Not on file  Social History Narrative   Married    Retired    Scientist, physiological Strain: [place]  (09/03/2021)   Overall Financial Resource  Strain (CARDIA)    Difficulty of Paying Living Expenses: Not hard at all  Food Insecurity: No Food Insecurity (09/03/2021)   Hunger Vital Sign    Worried About Running Out of Food in the Last Year: Never true    Ran Out of Food in the Last Year: Never true  Transportation Needs: No Transportation Needs (09/03/2021)   PRAPARE - Hydrologist (Medical): No    Lack of Transportation (Non-Medical): No  Physical Activity: Sufficiently Active (09/03/2021)   Exercise Vital Sign    Days of Exercise per Week: 4 days    Minutes of Exercise per Session: 120 min  Stress: No Stress Concern  Present (09/03/2021)   [place]    Feeling of Stress : Not at all  Social Connections: [place] (09/03/2021)   Social Connection and Isolation Panel [NHANES]    Frequency of Communication with Friends and Family: More than three times a week    Frequency of Social Gatherings with Friends and Family: More than three times a week    Attends Religious Services: More than 4 times per year    Active Member of [employer] or Organizations: Yes    Attends Music therapist: More than 4 times per year    Marital Status: Married  Human resources officer Violence: Not At Risk (09/03/2021)   Humiliation, Afraid, Rape, and Kick questionnaire    Fear of Current or Ex-Partner: No    Emotionally Abused: No    Physically Abused: No    Sexually Abused: No    Past Surgical History:  Procedure Laterality Date   [place]    Family History  Problem Relation Age of Onset   Stomach cancer Mother 71   Heart attack Father 22   Cancer Sister 30       adenocarcinoma   Heart attack Brother 63   Heart attack Brother 14    Allergies  Allergen Reactions   Ampicillin Other (See Comments)    Drug fever Did it involve swelling of the face/tongue/throat, SOB, or low BP? No Did it involve sudden or severe rash/hives, skin peeling, or any reaction on the inside of your mouth or nose? No Did you need to seek medical attention at a hospital or doctor's office? No When did it last happen? Over 10 years ago If all above answers are "NO", may proceed with cephalosporin use.    Current Outpatient Medications on File Prior to Visit  Medication Sig Dispense Refill   Cholecalciferol (VITAMIN D) 50 MCG (2000 UT) tablet Take 2,000 Units by mouth daily.     promethazine-dextromethorphan (PROMETHAZINE-DM) 6.25-15 MG/5ML syrup Take 5 mLs by mouth 4 (four) times daily as  needed for cough. 118 mL 0   trimethoprim-polymyxin b (POLYTRIM) ophthalmic solution Place 1 drop into both eyes every 6 (six) hours. 10 mL 0   No current facility-administered medications on file prior to visit.    BP 130/70   Pulse (!) 53   Temp 98.7 F (37.1 C) (Oral)   Ht '5\' 5"'$  (1.651 m)   Wt 118 lb (53.5 kg)   SpO2 98%   BMI 19.64 kg/m       Objective:   Physical Exam Vitals and nursing note reviewed.  Constitutional:      General: She is not in acute distress.  Appearance: Normal appearance. She is well-developed. She is not ill-appearing.  HENT:     Head: Normocephalic and atraumatic.     Right Ear: Tympanic membrane, ear canal and external ear normal. There is no impacted cerumen.     Left Ear: Tympanic membrane, ear canal and external ear normal. There is no impacted cerumen.     Nose: Nose normal. No congestion or rhinorrhea.     Mouth/Throat:     Mouth: Mucous membranes are moist.     Pharynx: Oropharynx is clear. No oropharyngeal exudate or posterior oropharyngeal erythema.  Eyes:     General:        Right eye: No discharge.        Left eye: No discharge.     Extraocular Movements: Extraocular movements intact.     Conjunctiva/sclera: Conjunctivae normal.     Pupils: Pupils are equal, round, and reactive to light.  Neck:     Thyroid: No thyromegaly.     Vascular: No carotid bruit.     Trachea: No tracheal deviation.  Cardiovascular:     Rate and Rhythm: Normal rate and regular rhythm.     Pulses: Normal pulses.     Heart sounds: Normal heart sounds. No murmur heard.    No friction rub. No gallop.  Pulmonary:     Effort: Pulmonary effort is normal. No respiratory distress.     Breath sounds: Normal breath sounds. No stridor. No wheezing, rhonchi or rales.  Chest:     Chest wall: No tenderness.  Abdominal:     General: Abdomen is flat. Bowel sounds are normal. There is no distension.     Palpations: Abdomen is soft. There is no mass.     Tenderness:  There is no abdominal tenderness. There is no right CVA tenderness, left CVA tenderness, guarding or rebound.     Hernia: No hernia is present.  Musculoskeletal:        General: No swelling, tenderness, deformity or signs of injury. Normal range of motion.     Cervical back: Normal range of motion and neck supple.     Right lower leg: No edema.     Left lower leg: No edema.  Lymphadenopathy:     Cervical: No cervical adenopathy.  Skin:    General: Skin is warm and dry.     Coloration: Skin is not jaundiced or pale.     Findings: No bruising, erythema, lesion or rash.  Neurological:     General: No focal deficit present.     Mental Status: She is alert and oriented to person, place, and time.     Cranial Nerves: No cranial nerve deficit.     Sensory: No sensory deficit.     Motor: No weakness.     Coordination: Coordination normal.     Gait: Gait normal.     Deep Tendon Reflexes: Reflexes normal.  Psychiatric:        Mood and Affect: Mood normal.        Behavior: Behavior normal.        Thought Content: Thought content normal.        Judgment: Judgment normal.       Assessment & Plan:  1. Routine general medical examination at a health care facility  - CBC with Differential/Platelet; Future - Comprehensive metabolic panel; Future - Lipid panel; Future - TSH; Future  2. Hyperlipidemia, unspecified hyperlipidemia type  - CBC with Differential/Platelet; Future - Comprehensive metabolic panel; Future - Lipid panel; Future -  TSH; Future  3. Vitamin D deficiency  - VITAMIN D 25 Hydroxy (Vit-D Deficiency, Fractures); Future  4. Osteopenia, unspecified location - Will hold off on repeat bone density right now, per patient  - CBC with Differential/Platelet; Future - Comprehensive metabolic panel; Future - Lipid panel; Future - TSH; Future - VITAMIN D 25 Hydroxy (Vit-D Deficiency, Fractures); Future  5. Need for hepatitis C screening test  - Hep C Antibody  6.  Invasive ductal carcinoma of breast, female, left ([place]) -We discussed this at length.  All questions answered to the best of my ability.  She was encouraged to reach out if she has any questions or needs anything.  Dorothyann Peng, NP

## 2021-10-09 NOTE — Patient Instructions (Signed)
There are no preventive care reminders to display for this patient.    Row Labels 09/03/2021   11:32 AM 07/09/2021   10:15 AM 06/08/2020    2:19 PM  Depression screen PHQ 2/9   Section Header. No data exists in this row.     Decreased Interest   0 0 0  Down, Depressed, Hopeless   0 0 0  PHQ - 2 Score   0 0 0  Altered sleeping   0 1   Tired, decreased energy   0 1   Change in appetite   0 1   Feeling bad or failure about yourself    0 0   Trouble concentrating   0 0   Moving slowly or fidgety/restless   0 0   Suicidal thoughts   0 0   PHQ-9 Score   0 3   Difficult doing work/chores   Not difficult at all Not difficult at all

## 2021-10-10 ENCOUNTER — Telehealth: Payer: Self-pay | Admitting: Hematology and Oncology

## 2021-10-10 ENCOUNTER — Telehealth: Payer: Self-pay | Admitting: Adult Health

## 2021-10-10 LAB — HEPATITIS C ANTIBODY: Hepatitis C Ab: NONREACTIVE

## 2021-10-10 NOTE — Telephone Encounter (Signed)
Patient notified of update  and verbalized understanding. 

## 2021-10-10 NOTE — Telephone Encounter (Signed)
Left message for patient to return call in reference to upcoming appointment for 8/9, will send packet

## 2021-10-10 NOTE — Telephone Encounter (Signed)
Pt call and stated she is returning your call and want a call back. 

## 2021-10-19 ENCOUNTER — Encounter: Payer: Self-pay | Admitting: *Deleted

## 2021-10-19 DIAGNOSIS — C50212 Malignant neoplasm of upper-inner quadrant of left female breast: Secondary | ICD-10-CM

## 2021-10-23 NOTE — Progress Notes (Signed)
Radiation Oncology         [phone] ________________________________  Name: Patricia Marshall        MRN: [id number]  Date of Service: 10/24/2021 DOB: 26-Jan-1946  BJ:[id number], Tommi Rumps, NP  Rolm Bookbinder, MD     REFERRING PHYSICIAN: Rolm Bookbinder, MD   DIAGNOSIS: The encounter diagnosis was Malignant neoplasm of upper-inner quadrant of left breast in female, estrogen receptor positive ([place]).   HISTORY OF PRESENT ILLNESS: Patricia Marshall is a 76 y.o. female seen in the multidisciplinary breast clinic for a new diagnosis of left breast cancer. The patient had a screening mammogram in July 2023 that showed a possible mass in the left breast.  No abnormalities were seen in the right.  She underwent diagnostic mammography and ultrasound on 10/01/2021.  By ultrasound this was seen in the 11 o'clock position measuring up to 2 cm in greatest dimension.  She had an oval mass with angular margins in the left axilla measuring 1.2 cm.  She underwent biopsies of these sites on 10/04/2021.  Final pathology showed grade 2 invasive ductal carcinoma in the left breast specimen.  Her cancer was ER positive PR negative HER2 amplified, her left axillary biopsy showed pseudoangiomatous stromal hyperplasia without evidence of malignancy.  She is seen today to discuss treatment recommendations of her cancer.    PREVIOUS RADIATION THERAPY: {EXAM; YES/NO:19492::"No"}   PAST MEDICAL HISTORY:  Past Medical History:  Diagnosis Date   Chicken pox    High cholesterol    Vitamin D deficiency        PAST SURGICAL HISTORY: Past Surgical History:  Procedure Laterality Date   APPENDECTOMY  1959   BACK SURGERY     1996   TONSILLECTOMY AND ADENOIDECTOMY  1954     FAMILY HISTORY:  Family History  Problem Relation Age of Onset   Stomach cancer Mother 19   Heart attack Father 95   Cancer Sister 71       adenocarcinoma   Heart attack Brother 4   Heart attack Brother 6     SOCIAL HISTORY:   reports that she has never smoked. She has never used smokeless tobacco. She reports that she does not use drugs. The patient is married and lives in [place]. She ***   ALLERGIES: Ampicillin   MEDICATIONS:  Current Outpatient Medications  Medication Sig Dispense Refill   Cholecalciferol (VITAMIN D) 50 MCG (2000 UT) tablet Take 2,000 Units by mouth daily.     trimethoprim-polymyxin b (POLYTRIM) ophthalmic solution Place 1 drop into both eyes every 6 (six) hours. 10 mL 0   No current facility-administered medications for this visit.     REVIEW OF SYSTEMS: On review of systems, the patient reports that she is doing ***     PHYSICAL EXAM:  Wt Readings from Last 3 Encounters:  10/09/21 118 lb (53.5 kg)  09/03/21 121 lb 6.4 oz (55.1 kg)  07/09/21 120 lb 9.6 oz (54.7 kg)   Temp Readings from Last 3 Encounters:  10/09/21 98.7 F (37.1 C) (Oral)  09/03/21 98.5 F (36.9 C) (Oral)  07/09/21 99.2 F (37.3 C) (Oral)   BP Readings from Last 3 Encounters:  10/09/21 130/70  09/03/21 118/60  07/09/21 124/80   Pulse Readings from Last 3 Encounters:  10/09/21 (!) 53  09/03/21 61  07/09/21 74    In general this is a well appearing *** female in no acute distress. She's alert and oriented x4 and appropriate throughout the examination. Cardiopulmonary assessment is negative  for acute distress and she exhibits normal effort. Bilateral breast exam is deferred.    ECOG = ***  0 - Asymptomatic (Fully active, able to carry on all predisease activities without restriction)  1 - Symptomatic but completely ambulatory (Restricted in physically strenuous activity but ambulatory and able to carry out work of a light or sedentary nature. For example, light housework, office work)  2 - Symptomatic, <50% in bed during the day (Ambulatory and capable of all self care but unable to carry out any work activities. Up and about more than 50% of waking hours)  3 - Symptomatic, >50% in bed, but not  bedbound (Capable of only limited self-care, confined to bed or chair 50% or more of waking hours)  4 - Bedbound (Completely disabled. Cannot carry on any self-care. Totally confined to bed or chair)  5 - Death   Eustace Pen MM, Creech RH, Tormey DC, et al. [phone]). "Toxicity and response criteria of the [facility]". [place] Oncol. 5 (6): 649-55    LABORATORY DATA:  Lab Results  Component Value Date   WBC 5.5 10/09/2021   HGB 14.0 10/09/2021   HCT 42.5 10/09/2021   MCV 88.7 10/09/2021   PLT 192.0 10/09/2021   Lab Results  Component Value Date   NA 137 10/09/2021   K 4.4 10/09/2021   CL 101 10/09/2021   CO2 26 10/09/2021   Lab Results  Component Value Date   ALT 10 10/09/2021   AST 18 10/09/2021   ALKPHOS 95 10/09/2021   BILITOT 0.8 10/09/2021      RADIOGRAPHY: [place] AXILLARY NODE CORE BIOPSY LEFT  Addendum Date: 10/15/2021   ADDENDUM REPORT: 10/15/2021 08:51 ADDENDUM: Pathology revealed GRADE II INVASIVE DUCTAL CARCINOMA of the LEFT breast, 11:00 o'clock, 9 cmfn, (ribbon clip). This was found to be concordant by Dr. Nolon Nations. Pathology revealed BENIGN BREAST TISSUE WITH PSEUDOANGIOMATOUS STROMAL HYPERPLASIA ([place]) of the LEFT axilla, (hydromark clip). This was found to be concordant by Dr. Nolon Nations. Pathology results were discussed with the patient and her husband by telephone. The patient reported doing well after the biopsies with tenderness and bruising at the sites. Post biopsy instructions and care were reviewed and questions were answered. The patient was encouraged to call [facility] for any additional concerns. My direct phone number was provided. The patient was referred to [facility] [id number] [facility] on October 24, 2021, per patient request. Pathology results reported by Terie Purser, RN on 10/09/2021. Electronically Signed   By: Nolon Nations M.D.    On: 10/15/2021 08:51   Result Date: 10/15/2021 CLINICAL DATA:  Patient presents for ultrasound-guided biopsy of LEFT breast mass and LEFT axillary mass. EXAM: ULTRASOUND GUIDED LEFT BREAST CORE NEEDLE BIOPSY ULTRASOUND-GUIDED LEFT AXILLARY CORE BIOPSY COMPARISON:  Previous exam(s). PROCEDURE: I met with the patient and we discussed the procedure of ultrasound-guided biopsy, including benefits and alternatives. We discussed the high likelihood of a successful procedure. We discussed the risks of the procedure, including infection, bleeding, tissue injury, clip migration, and inadequate sampling. Informed written consent was given. The usual time-out protocol was performed immediately prior to the procedure. Site 1: LEFT breast 11 o'clock. Lesion quadrant: UPPER INNER QUADRANT, ribbon clip Using sterile technique and 1% lidocaine as local anesthetic, under direct ultrasound visualization, a 12 gauge spring-loaded device was used to perform biopsy of mass in the 11 o'clock location of the LEFT breast using a  LATERAL to MEDIAL approach. At the conclusion of the procedure ribbon tissue marker clip was deployed into the biopsy cavity. Site 2: LEFT axilla. Lesion quadrant: LEFT axilla, HydroMARK spiral clip Using sterile technique and lidocaine and lidocaine with epi as local anesthetic, under direct ultrasound visualization, a 14 gauge spring-loaded device was used to perform biopsy of mass in the anterior LOWER LEFT axilla using a LATERAL to be approach. At the conclusion of the procedure spiral hydro tissue marker clip was deployed into the biopsy cavity. Follow-up 2-view mammogram was performed and dictated separately. IMPRESSION: Ultrasound guided biopsy of LEFT breast mass and LEFT axillary mass. No apparent complications. Electronically Signed: By: Nolon Nations M.D. On: 10/04/2021 13:52  [place] LT BREAST BX W LOC DEV 1ST LESION IMG BX SPEC US GUIDE  Addendum Date: 10/15/2021   ADDENDUM REPORT: 10/15/2021 08:51  ADDENDUM: Pathology revealed GRADE II INVASIVE DUCTAL CARCINOMA of the LEFT breast, 11:00 o'clock, 9 cmfn, (ribbon clip). This was found to be concordant by Dr. Nolon Nations. Pathology revealed BENIGN BREAST TISSUE WITH PSEUDOANGIOMATOUS STROMAL HYPERPLASIA () of the LEFT axilla, (hydromark clip). This was found to be concordant by Dr. Nolon Nations. Pathology results were discussed with the patient and her husband by telephone. The patient reported doing well after the biopsies with tenderness and bruising at the sites. Post biopsy instructions and care were reviewed and questions were answered. The patient was encouraged to call [facility] for any additional concerns. My direct phone number was provided. The patient was referred to [facility] on October 24, 2021, per patient request. Pathology results reported by Terie Purser, RN on 10/09/2021. Electronically Signed   By: Nolon Nations M.D.   On: 10/15/2021 08:51   Result Date: 10/15/2021 CLINICAL DATA:  Patient presents for ultrasound-guided biopsy of LEFT breast mass and LEFT axillary mass. EXAM: ULTRASOUND GUIDED LEFT BREAST CORE NEEDLE BIOPSY ULTRASOUND-GUIDED LEFT AXILLARY CORE BIOPSY COMPARISON:  Previous exam(s). PROCEDURE: I met with the patient and we discussed the procedure of ultrasound-guided biopsy, including benefits and alternatives. We discussed the high likelihood of a successful procedure. We discussed the risks of the procedure, including infection, bleeding, tissue injury, clip migration, and inadequate sampling. Informed written consent was given. The usual time-out protocol was performed immediately prior to the procedure. Site 1: LEFT breast 11 o'clock. Lesion quadrant: UPPER INNER QUADRANT, ribbon clip Using sterile technique and 1% lidocaine as local anesthetic, under direct ultrasound visualization, a 12 gauge spring-loaded  device was used to perform biopsy of mass in the 11 o'clock location of the LEFT breast using a LATERAL to MEDIAL approach. At the conclusion of the procedure ribbon tissue marker clip was deployed into the biopsy cavity. Site 2: LEFT axilla. Lesion quadrant: LEFT axilla, HydroMARK spiral clip Using sterile technique and lidocaine and lidocaine with epi as local anesthetic, under direct ultrasound visualization, a 14 gauge spring-loaded device was used to perform biopsy of mass in the anterior LOWER LEFT axilla using a LATERAL to be approach. At the conclusion of the procedure spiral hydro tissue marker clip was deployed into the biopsy cavity. Follow-up 2-view mammogram was performed and dictated separately. IMPRESSION: Ultrasound guided biopsy of LEFT breast mass and LEFT axillary mass. No apparent complications. Electronically Signed: By: Nolon Nations M.D. On: 10/04/2021 13:52  MM CLIP PLACEMENT LEFT  Result Date: 10/04/2021 CLINICAL DATA:  Status post biopsy of LEFT breast mass and LEFT axillary mass.  EXAM: 3D DIAGNOSTIC LEFT MAMMOGRAM POST ULTRASOUND BIOPSY x2 COMPARISON:  Previous exam(s). FINDINGS: 3D Mammographic images were obtained following ultrasound guided biopsy of mass in the 11 o'clock location LEFT breast placement of ribbon shaped. The biopsy marking clip is in expected position at the site of biopsy. Following biopsy mass in the anterior LOWER LEFT axilla, spiral HydroMARK was placed and is identified in the location. IMPRESSION: Tissue marker clips are in the expected locations after biopsy. Final Assessment: Post Procedure Mammograms for Marker Placement Electronically Signed   By: Nolon Nations M.D.   On: 10/04/2021 14:06  MM DIAG BREAST TOMO UNI LEFT  Result Date: 10/01/2021 CLINICAL DATA:  Patient returns after screening study for evaluation possible LEFT breast mass. EXAM: DIGITAL DIAGNOSTIC UNILATERAL LEFT MAMMOGRAM WITH TOMOSYNTHESIS AND CAD; ULTRASOUND LEFT BREAST LIMITED  TECHNIQUE: Left digital diagnostic mammography and breast tomosynthesis was performed. The images were evaluated with computer-aided detection.; Targeted ultrasound examination of the left breast was performed. COMPARISON:  Previous exam(s). ACR Breast Density Category c: The breast tissue is heterogeneously dense, which may obscure small masses. FINDINGS: Additional 2-D and 3-D images are performed. These views confirm presence of an irregular mass with spiculated margins in the UPPER INNER QUADRANT of the LEFT breast. On physical exam, there is a visible protuberant mass in the [place] of the LEFT breast. Mass is firm in somewhat fixed on physical exam. Targeted ultrasound is performed, showing an irregular mass with angular margins in the 11 o'clock location of the LEFT breast 9 centimeters from the nipple measuring 1.8 x 1.4 x 2.0 centimeters. No internal blood flow identified on Doppler evaluation. Evaluation of the LEFT axilla shows an oval mass with angular margins and heterogeneous echotexture in the anterior LEFT axilla, measuring 1.2 x 0.7 x 1.2 centimeters. No internal blood flow identified on Doppler evaluation. Evaluation of the remainder of the axilla shows lymph nodes normal morphology. IMPRESSION: 1. Indeterminate mass in the 11 o'clock location of the LEFT breast. 2. Indeterminate mass in the LOWER anterior LEFT axilla. RECOMMENDATION: 1. Recommend ultrasound-guided core biopsy of mass 11 o'clock location. 2. Recommend ultrasound-guided biopsy of indeterminate mass in the LOWER anterior axilla. I have discussed the findings and recommendations with the patient. If applicable, a reminder letter will be sent to the patient regarding the next appointment. BI-RADS CATEGORY  5: Highly suggestive of malignancy. Electronically Signed   By: Nolon Nations M.D.   On: 10/01/2021 10:11  US BREAST LTD UNI LEFT INC AXILLA  Result Date: 10/01/2021 CLINICAL DATA:  Patient returns after screening  study for evaluation possible LEFT breast mass. EXAM: DIGITAL DIAGNOSTIC UNILATERAL LEFT MAMMOGRAM WITH TOMOSYNTHESIS AND CAD; ULTRASOUND LEFT BREAST LIMITED TECHNIQUE: Left digital diagnostic mammography and breast tomosynthesis was performed. The images were evaluated with computer-aided detection.; Targeted ultrasound examination of the left breast was performed. COMPARISON:  Previous exam(s). ACR Breast Density Category c: The breast tissue is heterogeneously dense, which may obscure small masses. FINDINGS: Additional 2-D and 3-D images are performed. These views confirm presence of an irregular mass with spiculated margins in the UPPER INNER QUADRANT of the LEFT breast. On physical exam, there is a visible protuberant mass in the [place] of the LEFT breast. Mass is firm in somewhat fixed on physical exam. Targeted ultrasound is performed, showing an irregular mass with angular margins in the 11 o'clock location of the LEFT breast 9 centimeters from the nipple measuring 1.8 x 1.4 x 2.0 centimeters. No internal blood flow identified  on Doppler evaluation. Evaluation of the LEFT axilla shows an oval mass with angular margins and heterogeneous echotexture in the anterior LEFT axilla, measuring 1.2 x 0.7 x 1.2 centimeters. No internal blood flow identified on Doppler evaluation. Evaluation of the remainder of the axilla shows lymph nodes normal morphology. IMPRESSION: 1. Indeterminate mass in the 11 o'clock location of the LEFT breast. 2. Indeterminate mass in the LOWER anterior LEFT axilla. RECOMMENDATION: 1. Recommend ultrasound-guided core biopsy of mass 11 o'clock location. 2. Recommend ultrasound-guided biopsy of indeterminate mass in the LOWER anterior axilla. I have discussed the findings and recommendations with the patient. If applicable, a reminder letter will be sent to the patient regarding the next appointment. BI-RADS CATEGORY  5: Highly suggestive of malignancy. Electronically Signed    By: Nolon Nations M.D.   On: 10/01/2021 10:11      IMPRESSION/PLAN: 1. Stage IA, [id number], grade 2 ER positive, HER2 amplified invasive ductal carcinoma of the left breast. Dr. Lisbeth Renshaw discusses the pathology findings and reviews the nature of left breast disease. The consensus from the breast conference includes breast conservation with lumpectomy with  sentinel node biopsy. Dr. Chryl Heck recommends adjuvant chemotherapy and antiHER2 therapy. Following chemotherapy, Dr. Lisbeth Renshaw would then recommend external radiotherapy to the breast  to reduce risks of local recurrence followed by antiestrogen therapy. We discussed the risks, benefits, short, and long term effects of radiotherapy, as well as the curative intent, and the patient is interested in proceeding. Dr. Lisbeth Renshaw discusses the delivery and logistics of radiotherapy and anticipates a course of 4 or up to 6 1/2 weeks of radiotherapy to the left breast with deep inspiration breath hold technique. She is aware we would treat her regional nodes if positive pathologically. We will see her back a few weeks after surgery to discuss the simulation process and anticipate we starting radiotherapy about 4-6 weeks after surgery.  2. Possible genetic predisposition to malignancy. The patient is a candidate for genetic testing given her personal history. She will be seen today in clinic.   In a visit lasting *** minutes, greater than 50% of the time was spent face to face reviewing her case, as well as in preparation of, discussing, and coordinating the patient's care.  The above documentation reflects my direct findings during this shared patient visit. Please see the separate note by Dr. Lisbeth Renshaw on this date for the remainder of the patient's plan of care.    Carola Rhine, [facility]    **Disclaimer: This note was dictated with voice recognition software. Similar sounding words can inadvertently be transcribed and this note may contain transcription errors which may  not have been corrected upon publication of note.**

## 2021-10-24 ENCOUNTER — Encounter: Payer: Self-pay | Admitting: *Deleted

## 2021-10-24 ENCOUNTER — Encounter: Payer: Self-pay | Admitting: Hematology and Oncology

## 2021-10-24 ENCOUNTER — Inpatient Hospital Stay: Payer: Medicare HMO

## 2021-10-24 ENCOUNTER — Ambulatory Visit: Payer: Medicare HMO | Attending: General Surgery | Admitting: Physical Therapy

## 2021-10-24 ENCOUNTER — Inpatient Hospital Stay: Payer: Medicare HMO | Admitting: Genetic Counselor

## 2021-10-24 ENCOUNTER — Inpatient Hospital Stay: Payer: Medicare HMO | Attending: Hematology and Oncology | Admitting: Hematology and Oncology

## 2021-10-24 ENCOUNTER — Ambulatory Visit
Admission: RE | Admit: 2021-10-24 | Discharge: 2021-10-24 | Disposition: A | Discharge status: Home / Self Care | Payer: Medicare HMO | Source: Ambulatory Visit | Attending: Radiation Oncology | Admitting: Radiation Oncology

## 2021-10-24 ENCOUNTER — Other Ambulatory Visit: Payer: Self-pay

## 2021-10-24 ENCOUNTER — Inpatient Hospital Stay: Payer: Medicare HMO | Admitting: Licensed Clinical Social Worker

## 2021-10-24 ENCOUNTER — Encounter: Payer: Self-pay | Admitting: Physical Therapy

## 2021-10-24 DIAGNOSIS — Z800 Family history of malignant neoplasm of digestive organs: Secondary | ICD-10-CM | POA: Diagnosis not present

## 2021-10-24 DIAGNOSIS — Z8041 Family history of malignant neoplasm of ovary: Secondary | ICD-10-CM | POA: Diagnosis not present

## 2021-10-24 DIAGNOSIS — Z170 Estrogen receptor positive status [ER+]: Secondary | ICD-10-CM | POA: Insufficient documentation

## 2021-10-24 DIAGNOSIS — C50212 Malignant neoplasm of upper-inner quadrant of left female breast: Secondary | ICD-10-CM

## 2021-10-24 DIAGNOSIS — R293 Abnormal posture: Secondary | ICD-10-CM | POA: Diagnosis not present

## 2021-10-24 LAB — CBC WITH DIFFERENTIAL (CANCER CENTER ONLY)
Abs Immature Granulocytes: 0.02 10*3/uL (ref 0.00–0.07)
Basophils Absolute: 0.1 10*3/uL (ref 0.0–0.1)
Basophils Relative: 1 %
Eosinophils Absolute: 0.1 10*3/uL (ref 0.0–0.5)
Eosinophils Relative: 1 %
HCT: 40.2 % (ref 36.0–46.0)
Hemoglobin: 13.8 g/dL (ref 12.0–15.0)
Immature Granulocytes: 0 %
Lymphocytes Relative: 29 %
Lymphs Abs: 1.6 10*3/uL (ref 0.7–4.0)
MCH: 30 pg (ref 26.0–34.0)
MCHC: 34.3 g/dL (ref 30.0–36.0)
MCV: 87.4 fL (ref 80.0–100.0)
Monocytes Absolute: 0.5 10*3/uL (ref 0.1–1.0)
Monocytes Relative: 9 %
Neutro Abs: 3.3 10*3/uL (ref 1.7–7.7)
Neutrophils Relative %: 60 %
Platelet Count: 220 10*3/uL (ref 150–400)
RBC: 4.6 MIL/uL (ref 3.87–5.11)
RDW: 12.7 % (ref 11.5–15.5)
WBC Count: 5.6 10*3/uL (ref 4.0–10.5)
nRBC: 0 % (ref 0.0–0.2)

## 2021-10-24 LAB — CMP (CANCER CENTER ONLY)
ALT: 9 U/L (ref 0–44)
AST: 14 U/L — ABNORMAL LOW (ref 15–41)
Albumin: 4.5 g/dL (ref 3.5–5.0)
Alkaline Phosphatase: 84 U/L (ref 38–126)
Anion gap: 5 (ref 5–15)
BUN: 17 mg/dL (ref 8–23)
CO2: 31 mmol/L (ref 22–32)
Calcium: 9.8 mg/dL (ref 8.9–10.3)
Chloride: 103 mmol/L (ref 98–111)
Creatinine: 0.75 mg/dL (ref 0.44–1.00)
GFR, Estimated: >60 mL/min (ref >60)
Glucose, Bld: 82 mg/dL (ref 70–99)
Potassium: 4.6 mmol/L (ref 3.5–5.1)
Sodium: 139 mmol/L (ref 135–145)
Total Bilirubin: 0.7 mg/dL (ref 0.3–1.2)
Total Protein: 7.4 g/dL (ref 6.5–8.1)

## 2021-10-24 LAB — GENETIC SCREENING ORDER

## 2021-10-24 NOTE — Progress Notes (Signed)
[facility] CONSULT NOTE  Patient Care Team: [name], NP as PCP - General (Family Medicine) Emelia Loron, MD as Consulting Physician (General Surgery) Rachel Moulds, MD as Consulting Physician (Hematology and Oncology) Dorothy Puffer, MD as Consulting Physician (Radiation Oncology) Donnelly Angelica, RN as Oncology Nurse Navigator Pershing Proud, RN as Oncology Nurse Navigator  CHIEF COMPLAINTS/PURPOSE OF CONSULTATION:  Newly diagnosed breast cancer  HISTORY OF PRESENTING ILLNESS:  Patricia Marshall 76 y.o. female is here because of recent diagnosis of left breast cancer.  I reviewed her records extensively and collaborated the history with the patient.  SUMMARY OF ONCOLOGIC HISTORY: Oncology History  Malignant neoplasm of upper-inner quadrant of left breast in female, estrogen receptor positive (HCC)  09/20/2021 Mammogram   Screening mammogram showed extremely dense breasts and a possible mass in the left breast warranting further evaluation.  Breast density category is D Diagnostic mammogram of the breast showed indeterminate mass in the 11:00 location of the left breast.  Indeterminate mass in the lower anterior left axilla.   10/04/2021 Pathology Results   Pathology showed grade 2 invasive ductal carcinoma, left axillary soft tissue needle core biopsy showed benign breast tissue with patchy changes prognostics from the breast tumor showed ER 90% positive strong staining PR 0%, negative, Ki-67 of 30% and HER2 positive for IHC 3+   10/19/2021 Initial Diagnosis   Malignant neoplasm of upper-inner quadrant of left breast in female, estrogen receptor positive (HCC)   10/23/2021 Cancer Staging   Staging form: Breast, AJCC 8th Edition - Clinical stage from 10/23/2021: Stage IA (cT1c, cN0(f), cM0, G2, ER+, PR-, HER2+) - Signed by Ronny Bacon, PA-C on 10/23/2021 Stage prefix: Initial diagnosis Method of lymph node assessment: Core biopsy Histologic grading  system: 3 grade system    Patient arrived to the appointment today with her husband for breast MDC discussion.  She is very healthy at baseline, eats very healthy, exercises regularly, has history of vitamin D deficiency and arthritis.  She is nulliparous denies use of any hormone replacement therapy.  Mom had stomach cancer.  No breast cancer or ovarian cancer reported. Rest of the pertinent 10 point ROS reviewed and negative.  MEDICAL HISTORY:  Past Medical History:  Diagnosis Date   Arthritis    Breast cancer (HCC)    Chicken pox    High cholesterol    Vitamin D deficiency     SURGICAL HISTORY: Past Surgical History:  Procedure Laterality Date   APPENDECTOMY  1959   BACK SURGERY     1996   TONSILLECTOMY AND ADENOIDECTOMY  1954    SOCIAL HISTORY: Social History   Socioeconomic History   Marital status: Married    Spouse name: Not on file   Number of children: Not on file   Years of education: Not on file   Highest education level: Not on file  Occupational History   Not on file  Tobacco Use   Smoking status: Never   Smokeless tobacco: Never  Substance and Sexual Activity   Alcohol use: Yes    Alcohol/week: 1.0 standard drink of alcohol    Types: 1 Glasses of wine per week   Drug use: Never   Sexual activity: Not on file  Other Topics Concern   Not on file  Social History Narrative   Married    Retired    International aid/development worker of Health   Financial Resource Strain: Low Risk  (09/03/2021)   Overall Financial Resource Strain (CARDIA)  Difficulty of Paying Living Expenses: Not hard at all  Food Insecurity: No Food Insecurity (09/03/2021)   Hunger Vital Sign    Worried About Running Out of Food in the Last Year: Never true    Ran Out of Food in the Last Year: Never true  Transportation Needs: No Transportation Needs (09/03/2021)   PRAPARE - Hydrologist (Medical): No    Lack of Transportation (Non-Medical): No  Physical Activity:  Sufficiently Active (09/03/2021)   Exercise Vital Sign    Days of Exercise per Week: 4 days    Minutes of Exercise per Session: 120 min  Stress: No Stress Concern Present (09/03/2021)   [place]    Feeling of Stress : Not at all  Social Connections: [place] (09/03/2021)   Social Connection and Isolation Panel [NHANES]    Frequency of Communication with Friends and Family: More than three times a week    Frequency of Social Gatherings with Friends and Family: More than three times a week    Attends Religious Services: More than 4 times per year    Active Member of [employer] or Organizations: Yes    Attends Music therapist: More than 4 times per year    Marital Status: Married  Human resources officer Violence: Not At Risk (09/03/2021)   Humiliation, Afraid, Rape, and Kick questionnaire    Fear of Current or Ex-Partner: No    Emotionally Abused: No    Physically Abused: No    Sexually Abused: No    FAMILY HISTORY: Family History  Problem Relation Age of Onset   Stomach cancer Mother 73   Heart attack Father 68   Cancer Sister 58       adenocarcinoma   Heart attack Brother 32   Heart attack Brother 80    ALLERGIES:  is allergic to other and ampicillin.  MEDICATIONS:  Current Outpatient Medications  Medication Sig Dispense Refill   Cholecalciferol (VITAMIN D) 50 MCG (2000 UT) tablet Take 2,000 Units by mouth daily.     No current facility-administered medications for this visit.    REVIEW OF SYSTEMS:   Constitutional: Denies fevers, chills or abnormal night sweats Eyes: Denies blurriness of vision, double vision or watery eyes Ears, nose, mouth, throat, and face: Denies mucositis or sore throat Respiratory: Denies cough, dyspnea or wheezes Cardiovascular: Denies palpitation, chest discomfort or lower extremity swelling Gastrointestinal:  Denies nausea, heartburn or change in bowel  habits Skin: Denies abnormal skin rashes Lymphatics: Denies new lymphadenopathy or easy bruising Neurological:Denies numbness, tingling or new weaknesses Behavioral/Psych: Mood is stable, no new changes  Breast: Denies any palpable lumps or discharge All other systems were reviewed with the patient and are negative.  PHYSICAL EXAMINATION: ECOG PERFORMANCE STATUS: 0 - Asymptomatic  Vitals:   10/24/21 0853  BP: (!) 109/91  Pulse: 63  Resp: 16  Temp: 98.1 F (36.7 C)  SpO2: 100%   Filed Weights   10/24/21 0853  Weight: 118 lb 9.6 oz (53.8 kg)    GENERAL:alert, no distress and comfortable SKIN: skin color, texture, turgor are normal, no rashes or significant lesions EYES: normal, conjunctiva are pink and non-injected, sclera clear OROPHARYNX:no exudate, no erythema and lips, buccal mucosa, and tongue normal  NECK: supple, thyroid normal size, non-tender, without nodularity LYMPH:  no palpable lymphadenopathy in the cervical, axillary or inguinal LUNGS: clear to auscultation and percussion with normal breathing effort HEART: regular rate & rhythm and  no murmurs and no lower extremity edema ABDOMEN:abdomen soft, non-tender and normal bowel sounds Musculoskeletal:no cyanosis of digits and no clubbing  PSYCH: alert & oriented x 3 with fluent speech NEURO: no focal motor/sensory deficits BREAST: Palpable left breast upper inner quadrant mass measuring around 2 cm.  No palpable regional adenopathy. Right breast normal to inspection and palpation LABORATORY DATA:  I have reviewed the data as listed Lab Results  Component Value Date   WBC 5.6 10/24/2021   HGB 13.8 10/24/2021   HCT 40.2 10/24/2021   MCV 87.4 10/24/2021   PLT 220 10/24/2021   Lab Results  Component Value Date   NA 139 10/24/2021   K 4.6 10/24/2021   CL 103 10/24/2021   CO2 31 10/24/2021    RADIOGRAPHIC STUDIES: I have personally reviewed the radiological reports and agreed with the findings in the  report.  ASSESSMENT AND PLAN:  Malignant neoplasm of upper-inner quadrant of left breast in female, estrogen receptor positive ([place]) This is a very pleasant 76 year old postmenopausal female patient with newly diagnosed left breast upper inner quadrant invasive ductal carcinoma, grade 2, ER +90% strong staining, PR 0% negative, HER2 positive by IHC 3+, Ki-67 of 30% referred to breast [place] for additional recommendations.  Patient arrived to the appointment today with her husband.  She denies any major medical issues at baseline.  She is very healthy.  Physical examination today without any concerns except for the palpable left breast upper inner mass measuring about 2 cm with no definitive regional adenopathy.  Given HER2 amplified invasive ductal carcinoma, we have discussed about both neoadjuvant and adjuvant approach.  We have discussed about benefits of neoadjuvant therapy which include better prognostic information as well as change of antibody therapy if she ends up not having a complete pathologic response.  She may also benefit from adjuvant approach because she has a small ER positive tumor without definitive evidence of lymph node involvement and she may be able to get adjuvant weekly Taxol Herceptin based on apt trial.  Although APT trial has not been studied in the neoadjuvant setting, if she decides to do the neoadjuvant approach we can consider dose modified TCH versus Taxol Herceptin.  We have discussed about adverse effects of chemotherapy including but not limited to fatigue, nausea, vomiting, increased risk of infections, neuropathy, hair loss, cardiotoxicity etc.  We have discussed about risk factors for breast cancer in general and I addressed all her questions in detail. We have discussed about cold cap for chemotherapy-induced alopecia.  We have also discussed about this walk study to address neuropathy from paclitaxel which involves cooling off hands and feet to reduce risk of  peripheral neuropathy.  After she completes chemotherapy, she will start radiation followed by antiestrogen therapy.  She wanted to wait on discussion with a surgeon before she decides the neoadjuvant versus adjuvant approach.  We also discussed about the port for chemotherapy administration.  All her questions were answered to the best of my knowledge.  Thank you for consulting [place] in the care of this patient.  Please do not hesitate to contact us with any additional questions or concerns.   All questions were answered. The patient knows to call the clinic with any problems, questions or concerns.    Benay Pike, MD 10/24/21

## 2021-10-24 NOTE — Assessment & Plan Note (Signed)
This is a very pleasant 76 year old postmenopausal female patient with newly diagnosed left breast upper inner quadrant invasive ductal carcinoma, grade 2, ER +90% strong staining, PR 0% negative, HER2 positive by IHC 3+, Ki-67 of 30% referred to breast [place] for additional recommendations.  Patient arrived to the appointment today with her husband.  She denies any major medical issues at baseline.  She is very healthy.  Physical examination today without any concerns except for the palpable left breast upper inner mass measuring about 2 cm with no definitive regional adenopathy.  Given HER2 amplified invasive ductal carcinoma, we have discussed about both neoadjuvant and adjuvant approach.  We have discussed about benefits of neoadjuvant therapy which include better prognostic information as well as change of antibody therapy if she ends up not having a complete pathologic response.  She may also benefit from adjuvant approach because she has a small ER positive tumor without definitive evidence of lymph node involvement and she may be able to get adjuvant weekly Taxol Herceptin based on apt trial.  Although APT trial has not been studied in the neoadjuvant setting, if she decides to do the neoadjuvant approach we can consider dose modified TCH versus Taxol Herceptin.  We have discussed about adverse effects of chemotherapy including but not limited to fatigue, nausea, vomiting, increased risk of infections, neuropathy, hair loss, cardiotoxicity etc.  We have discussed about risk factors for breast cancer in general and I addressed all her questions in detail. We have discussed about cold cap for chemotherapy-induced alopecia.  We have also discussed about this walk study to address neuropathy from paclitaxel which involves cooling off hands and feet to reduce risk of peripheral neuropathy.  After she completes chemotherapy, she will start radiation followed by antiestrogen therapy.  She wanted to wait on  discussion with a surgeon before she decides the neoadjuvant versus adjuvant approach.  We also discussed about the port for chemotherapy administration.  All her questions were answered to the best of my knowledge.  Thank you for consulting [place] in the care of this patient.  Please do not hesitate to contact us with any additional questions or concerns.

## 2021-10-24 NOTE — Therapy (Signed)
OUTPATIENT PHYSICAL THERAPY BREAST CANCER BASELINE EVALUATION   Patient Name: Patricia Marshall MRN: [id number] DOB:March 27, 1945, 76 y.o., female Today's Date: 10/24/2021   PT End of Session - 10/24/21 1138     Visit Number 1    Number of Visits 2    Date for PT Re-Evaluation 12/19/21    PT Start Time 1009    PT Stop Time 1020   Also saw pt from [phone] for a total of 38 min   PT Time Calculation (min) 11 min    Activity Tolerance Patient tolerated treatment well    Behavior During Therapy WFL for tasks assessed/performed             Past Medical History:  Diagnosis Date   Arthritis    Breast cancer ([place])    Chicken pox    High cholesterol    Vitamin D deficiency    Past Surgical History:  Procedure Laterality Date   [place]   Patient Active Problem List   Diagnosis Date Noted   Malignant neoplasm of upper-inner quadrant of left breast in female, estrogen receptor positive ([place]) 10/19/2021   Piriformis syndrome of left side 07/12/2015   Degenerative arthritis of left knee 07/12/2015   BEE STING REACTION, LOCAL 10/24/2006    REFERRING PROVIDER: Dr. Rolm Bookbinder  REFERRING DIAG: Left breast cancer  THERAPY DIAG:  Malignant neoplasm of upper-inner quadrant of left breast in female, estrogen receptor positive ([place])  Abnormal posture  Rationale for Evaluation and Treatment Rehabilitation  ONSET DATE: 09/20/2021  SUBJECTIVE                                                                                                                                                                                           SUBJECTIVE STATEMENT: Patient reports she is here today to be seen by her medical team for her newly diagnosed left breast cancer.   PERTINENT HISTORY:  Patient was diagnosed on 09/20/2021 with left grade 2 invasive ductal carcinoma breast cancer. It measures 2 cm and is located  in the upper inner quadrant. It is ER positive, PR negative, and HER2 positive with a Ki67 of 30%.   PATIENT GOALS   reduce lymphedema risk and learn post op HEP.   PAIN:  Are you having pain? No   PRECAUTIONS: Active CA   HAND DOMINANCE: right  WEIGHT BEARING RESTRICTIONS No  FALLS:  Has patient fallen in last 6 months? No  LIVING ENVIRONMENT: Patient lives with: her husband Lives in: House/apartment Has following equipment at home:  None  OCCUPATION: retired  LEISURE: She plays pickleball 2-3x/week, lifts weights every other day  PRIOR LEVEL OF FUNCTION: Independent   OBJECTIVE  COGNITION:  Overall cognitive status: Within functional limits for tasks assessed    POSTURE:  Forward head and rounded shoulders posture  UPPER EXTREMITY AROM/PROM:  A/PROM RIGHT   eval   Shoulder extension 43  Shoulder flexion 146  Shoulder abduction 160  Shoulder internal rotation 70  Shoulder external rotation 78    (Blank rows = not tested)  A/PROM LEFT   eval  Shoulder extension 49  Shoulder flexion 130  Shoulder abduction 148  Shoulder internal rotation 68  Shoulder external rotation 70    (Blank rows = not tested)   CERVICAL AROM: All within normal limits  UPPER EXTREMITY STRENGTH: WNL   LYMPHEDEMA ASSESSMENTS:   LANDMARK RIGHT   eval  10 cm proximal to olecranon process 24  Olecranon process 22.4  10 cm proximal to ulnar styloid process 20.8  Just proximal to ulnar styloid process 14.3  Across hand at thumb web space 17.4  At base of 2nd digit 5.8  (Blank rows = not tested)  LANDMARK LEFT   eval  10 cm proximal to olecranon process 22.8  Olecranon process 21.5  10 cm proximal to ulnar styloid process 19.3  Just proximal to ulnar styloid process 14.2  Across hand at thumb web space 17.4  At base of 2nd digit 5.9  (Blank rows = not tested)   L-DEX LYMPHEDEMA SCREENING:  The patient was assessed using the L-Dex machine today to produce a  lymphedema index baseline score. The patient will be reassessed on a regular basis (typically every 3 months) to obtain new L-Dex scores. If the score is > 6.5 points away from his/her baseline score indicating onset of subclinical lymphedema, it will be recommended to wear a compression garment for 4 weeks, 12 hours per day and then be reassessed. If the score continues to be > 6.5 points from baseline at reassessment, we will initiate lymphedema treatment. Assessing in this manner has a 95% rate of preventing clinically significant lymphedema.   L-DEX FLOWSHEETS - 10/24/21 1100       L-DEX LYMPHEDEMA SCREENING   Measurement Type Unilateral    L-DEX MEASUREMENT EXTREMITY Upper Extremity    POSITION  Standing    DOMINANT SIDE Right    At Risk Side Left    BASELINE SCORE (UNILATERAL) -3              QUICK DASH SURVEY:  Katina Dung - 10/24/21 0001     Open a tight or new jar Mild difficulty    Do heavy household chores (wash walls, wash floors) No difficulty    Carry a shopping bag or briefcase No difficulty    Wash your back No difficulty    Use a knife to cut food No difficulty    Recreational activities in which you take some force or impact through your arm, shoulder, or hand (golf, hammering, tennis) No difficulty    During the past week, to what extent has your arm, shoulder or hand problem interfered with your normal social activities with family, friends, neighbors, or groups? Not at all    During the past week, to what extent has your arm, shoulder or hand problem limited your work or other regular daily activities Not at all    Arm, shoulder, or hand pain. None    Tingling (pins and needles) in your arm, shoulder, or  hand None    Difficulty Sleeping No difficulty    DASH Score 2.27 %              PATIENT EDUCATION:  Education details: Lymphedema risk reduction and post op shoulder/posture HEP Person educated: Patient Education method: Explanation, Demonstration,  Handout Education comprehension: Patient verbalized understanding and returned demonstration   HOME EXERCISE PROGRAM: Patient was instructed today in a home exercise program today for post op shoulder range of motion. These included active assist shoulder flexion in sitting, scapular retraction, wall walking with shoulder abduction, and hands behind head external rotation.  She was encouraged to do these twice a day, holding 3 seconds and repeating 5 times when permitted by her physician.   ASSESSMENT:  CLINICAL IMPRESSION: Patient was diagnosed on 09/20/2021 with left grade 2 invasive ductal carcinoma breast cancer. It measures 2 cm and is located in the upper inner quadrant. It is ER positive, PR negative, and HER2 positive with a Ki67 of 30%. Her multidisciplinary medical team met prior to her assessments to determine a recommended treatment plan. She is planning to have a left lumpectomy and sentinel node biopsy followed by chemotherapy, radiation, and anti-estrogen therapy. She will benefit from a post op PT reassessment to determine needs and from L-Dex screens every 3 months for 2 years to detect subclinical lymphedema.  Pt will benefit from skilled therapeutic intervention to improve on the following deficits: Decreased knowledge of precautions, impaired UE functional use, pain, decreased ROM, postural dysfunction.   PT treatment/interventions: ADL/self-care home management, pt/family education, therapeutic exercise  REHAB POTENTIAL: Excellent  CLINICAL DECISION MAKING: Stable/uncomplicated  EVALUATION COMPLEXITY: Low   GOALS: Goals reviewed with patient? YES  LONG TERM GOALS: (STG=LTG)    Name Target Date Goal status  1 Pt will be able to verbalize understanding of pertinent lymphedema risk reduction practices relevant to her dx specifically related to skin care.  Baseline:  No knowledge 10/24/2021 Achieved at eval  2 Pt will be able to return demo and/or verbalize understanding  of the post op HEP related to regaining shoulder ROM. Baseline:  No knowledge 10/24/2021 Achieved at eval  3 Pt will be able to verbalize understanding of the importance of attending the post op After Breast CA Class for further lymphedema risk reduction education and therapeutic exercise.  Baseline:  No knowledge 10/24/2021 Achieved at eval  4 Pt will demo she has regained full shoulder ROM and function post operatively compared to baselines.  Baseline: See objective measurements taken today. 12/19/2021      PLAN: PT FREQUENCY/DURATION: EVAL and 1 follow up appointment.   PLAN FOR NEXT SESSION: will reassess 3-4 weeks post op to determine needs.   Patient will follow up at outpatient cancer rehab 3-4 weeks following surgery.  If the patient requires physical therapy at that time, a specific plan will be dictated and sent to the referring physician for approval. The patient was educated today on appropriate basic range of motion exercises to begin post operatively and the importance of attending the After Breast Cancer class following surgery.  Patient was educated today on lymphedema risk reduction practices as it pertains to recommendations that will benefit the patient immediately following surgery.  She verbalized good understanding.    Physical Therapy Information for After Breast Cancer Surgery/Treatment:  Lymphedema is a swelling condition that you may be at risk for in your arm if you have lymph nodes removed from the armpit area.  After a sentinel node biopsy, the risk is  approximately 5-9% and is higher after an axillary node dissection.  There is treatment available for this condition and it is not life-threatening.  Contact your physician or physical therapist with concerns. You may begin the 4 shoulder/posture exercises (see additional sheet) when permitted by your physician (typically a week after surgery).  If you have drains, you may need to wait until those are removed before  beginning range of motion exercises.  A general recommendation is to not lift your arms above shoulder height until drains are removed.  These exercises should be done to your tolerance and gently.  This is not a "no pain/no gain" type of recovery so listen to your body and stretch into the range of motion that you can tolerate, stopping if you have pain.  If you are having immediate reconstruction, ask your plastic surgeon about doing exercises as he or she may want you to wait. We encourage you to attend the free one time ABC (After Breast Cancer) class offered by [facility].  You will learn information related to lymphedema risk, prevention and treatment and additional exercises to regain mobility following surgery.  You can call [phone] for more information.  This is offered the 1st and 3rd Monday of each month.  You only attend the class one time. While undergoing any medical procedure or treatment, try to avoid blood pressure being taken or needle sticks from occurring on the arm on the side of cancer.   This recommendation begins after surgery and continues for the rest of your life.  This may help reduce your risk of getting lymphedema (swelling in your arm). An excellent resource for those seeking information on lymphedema is the National Lymphedema Network's web site. It can be accessed at [place].org If you notice swelling in your hand, arm or breast at any time following surgery (even if it is many years from now), please contact your doctor or physical therapist to discuss this.  Lymphedema can be treated at any time but it is easier for you if it is treated early on.  If you feel like your shoulder motion is not returning to normal in a reasonable amount of time, please contact your surgeon or physical therapist.  [facility] [phone]. [address], [address], [place] [address]  ABC CLASS After Breast Cancer  Class  After Breast Cancer Class is a specially designed exercise class to assist you in a safe recover after having breast cancer surgery.  In this class you will learn how to get back to full function whether your drains were just removed or if you had surgery a month ago.  This one-time class is held the 1st and 3rd Monday of every month from 11:00 a.m. until 12:00 noon virtually.  This class is FREE and space is limited. For more information or to register for the next available class, call [phone].  Class Goals  Understand specific stretches to improve the flexibility of you chest and shoulder. Learn ways to safely strengthen your upper body and improve your posture. Understand the warning signs of infection and why you may be at risk for an arm infection. Learn about Lymphedema and prevention.  ** You do not attend this class until after surgery.  Drains must be removed to participate  Patient was instructed today in a home exercise program today for post op shoulder range of motion. These included active assist shoulder flexion in sitting, scapular retraction, wall walking with  shoulder abduction, and hands behind head external rotation.  She was encouraged to do these twice a day, holding 3 seconds and repeating 5 times when permitted by her physician.  Annia Friendly, [place] 10/24/21 11:45 AM

## 2021-10-25 ENCOUNTER — Telehealth: Payer: Self-pay | Admitting: Hematology and Oncology

## 2021-10-25 NOTE — Telephone Encounter (Signed)
Contacted patient to scheduled appointments. Left message with appointment details and a call back number if patient had any questions or could not accommodate the time we provided.   

## 2021-10-25 NOTE — Addendum Note (Signed)
Encounter addended by: Kyung Rudd, MD on: 10/25/2021 10:20 AM  Actions taken: Edit attestation on clinical note

## 2021-10-26 ENCOUNTER — Encounter: Payer: Self-pay | Admitting: Genetic Counselor

## 2021-10-26 NOTE — Progress Notes (Signed)
REFERRING PROVIDER: Benay Pike, MD [place],  [place] [id number]  PRIMARY PROVIDER:  Dorothyann Peng, NP  PRIMARY REASON FOR VISIT:  1. Malignant neoplasm of upper-inner quadrant of left breast in female, estrogen receptor positive ([place])   2. Family history of ovarian cancer     HISTORY OF PRESENT ILLNESS:   Patricia Marshall, a 76 y.o. female, was seen for a [place] cancer genetics consultation at the request of Dr. Chryl Heck due to a personal history of breast cancer.  Patricia Marshall presents to clinic today to discuss the possibility of a hereditary predisposition to cancer, to discuss genetic testing, and to further clarify her future cancer risks, as well as potential cancer risks for family members.   In July 2023, at the age of 66, Patricia Marshall was diagnosed with invasive ductal carcinoma of the left breast (ER+/HER2-/PR+). The treatment plan is pending.   CANCER HISTORY:  Oncology History  Malignant neoplasm of upper-inner quadrant of left breast in female, estrogen receptor positive ([place])  09/20/2021 Mammogram   Screening mammogram showed extremely dense breasts and a possible mass in the left breast warranting further evaluation.  Breast density category is D Diagnostic mammogram of the breast showed indeterminate mass in the 11:00 location of the left breast.  Indeterminate mass in the lower anterior left axilla.   10/04/2021 Pathology Results   Pathology showed grade 2 invasive ductal carcinoma, left axillary soft tissue needle core biopsy showed benign breast tissue with patchy changes prognostics from the breast tumor showed ER 90% positive strong staining PR 0%, negative, Ki-67 of 30% and HER2 positive for IHC 3+   10/19/2021 Initial Diagnosis   Malignant neoplasm of upper-inner quadrant of left breast in female, estrogen receptor positive ([place])   10/23/2021 Cancer Staging   Staging form: Breast, AJCC 8th Edition - Clinical stage from 10/23/2021: Stage IA (cT1c, cN0(f), cM0,  G2, ER+, PR-, HER2+) - Signed by Hayden Pedro, PA-C on 10/23/2021 Stage prefix: Initial diagnosis Method of lymph node assessment: Core biopsy Histologic grading system: 3 grade system      RISK FACTORS:  Colonoscopy: yes;  most recent in May 2019 . Menarche was at age 62.  Nulliparous.  HRT use: 0 years.   Past Medical History:  Diagnosis Date   Arthritis    Breast cancer ([place])    Chicken pox    High cholesterol    Vitamin D deficiency     Past Surgical History:  Procedure Laterality Date   APPENDECTOMY  1959   BACK SURGERY     1996   TONSILLECTOMY AND ADENOIDECTOMY  1954    FAMILY HISTORY:  We obtained a detailed, 4-generation family history.  Significant diagnoses are listed below: Family History  Problem Relation Age of Onset   Stomach cancer Mother 75   Cancer Sister        dx mid 11s; unknown primary w/ mets; treated like ovarian cancer    Patricia Marshall stated that her niece my have had genetic testing previously but is unaware of the results. Patricia Marshall has limited information about maternal family ancestry.  She believes some maternal ancestors are from [place], suggesting possibly [place] ancestry.  There is no known consanguinity.  GENETIC COUNSELING ASSESSMENT: Patricia Marshall is a 76 y.o. female with a personal and family history of cancer which is somewhat suggestive of a hereditary cancer syndrome given her sisters unknown metastatic cancer that was treated like ovarian cancer and her personal history of breast  cancer. We, therefore, discussed and recommended the following at today's visit.   DISCUSSION: We discussed that 5 - 10% of cancer is hereditary.  Most cases of hereditary breast cancer are associated with mutations in BRCA1/2.  There are other genes that can be associated with hereditary breast cancer syndromes. We discussed that testing is beneficial for several reasons including knowing how to follow individuals for their cancer risks  and understanding if other family members could be at risk for cancer and allowing them to undergo genetic testing.   We reviewed the characteristics, features and inheritance patterns of hereditary cancer syndromes. We also discussed genetic testing, including the appropriate family members to test, the process of testing, insurance coverage and turn-around-time for results. We discussed the implications of a negative, positive, carrier and/or variant of uncertain significant result. We recommended Patricia Marshall pursue genetic testing for a panel that includes genes associated with breast, ovarian, gastric, and other cancers.   The CancerNext-Expanded gene panel offered by [facility] and includes sequencing, rearrangement, and RNA analysis for the following 77 genes: AIP, ALK, APC, ATM, AXIN2, BAP1, BARD1, BLM, BMPR1A, BRCA1, BRCA2, BRIP1, CDC73, CDH1, CDK4, CDKN1B, CDKN2A, CHEK2, CTNNA1, DICER1, FANCC, FH, FLCN, GALNT12, KIF1B, LZTR1, MAX, MEN1, MET, MLH1, MSH2, MSH3, MSH6, MUTYH, NBN, NF1, NF2, NTHL1, PALB2, PHOX2B, PMS2, POT1, PRKAR1A, PTCH1, PTEN, RAD51C, RAD51D, RB1, RECQL, RET, SDHA, SDHAF2, SDHB, SDHC, SDHD, SMAD4, SMARCA4, SMARCB1, SMARCE1, STK11, SUFU, TMEM127, TP53, TSC1, TSC2, VHL and XRCC2 (sequencing and deletion/duplication); EGFR, EGLN1, HOXB13, KIT, MITF, PDGFRA, POLD1, and POLE (sequencing only); EPCAM and GREM1 (deletion/duplication only).   Based on Patricia Marshall personal and family history of cancer and possible Jewish ancestry, she meets medical criteria for genetic testing. Despite that she meets criteria, she may still have an out of pocket cost. We discussed that if her out of pocket cost for testing is over $100, the laboratory should contact her and discuss the self-pay prices and/or patient pay assistance programs.    PLAN: After considering the risks, benefits, and limitations, Patricia Marshall provided informed consent to pursue genetic testing and the blood sample was sent to  [facility] for analysis of the CancerNext-Expanded +RNAinsight Panel. Results should be available within approximately 3 weeks' time, at which point they will be disclosed by telephone to Patricia Marshall, as will any additional recommendations warranted by these results. Patricia Marshall will receive a summary of her genetic counseling visit and a copy of her results once available. This information will also be available in Epic.   Patricia Marshall questions were answered to her satisfaction today. Our contact information was provided should additional questions or concerns arise. Thank you for the referral and allowing [place] to share in the care of your patient.   Connery Shiffler M. Joette Catching, [place], [facility] Genetic Counselor [email or web address] (P) [phone]  The patient was seen for a total of 20 minutes in face-to-face genetic counseling.  The patient was accompanied by her husband, Patricia Marshall.  Case was reviewed with Dr. Chryl Heck.   _______________________________________________________________________ For Office Staff:  Number of people involved in session: 2 Was an Intern/ student involved with case: no

## 2021-10-29 DIAGNOSIS — C50212 Malignant neoplasm of upper-inner quadrant of left female breast: Secondary | ICD-10-CM

## 2021-10-29 NOTE — Research (Signed)
[employer] 2021-05 - Specimen Collection Study to Evaluate Biomarkers in Subjects with Cancer   Patient Patricia Marshall was identified by Dr Chryl Heck as a potential candidate for the above listed study. This Clinical Research Nurse spoke with Patricia Marshall, [phone], on 10/29/21 on the phone to discuss participation in the above listed research study. A copy of the informed consent document with embedded HIPAA language will be mailed to the patient. Patient reads, speaks, and understands [place].    Patient will also be mailed the business card of this Nurse and encouraged to contact the research team with any questions.  Approximately 10 minutes were spent discussing the above-listed study with the patient. Research will follow-up with the patient later this week to answer questions and further discuss participation.  Patricia Penna, RN, BSN, CPN Clinical Research Nurse I [phone]  10/29/2021 10:14 AM

## 2021-10-30 ENCOUNTER — Telehealth: Payer: Self-pay | Admitting: *Deleted

## 2021-10-30 ENCOUNTER — Encounter: Payer: Self-pay | Admitting: *Deleted

## 2021-10-30 NOTE — Telephone Encounter (Signed)
Left vm regarding [facility] from 10/24/21. Contact information provided for questions or needs.

## 2021-10-31 ENCOUNTER — Ambulatory Visit (HOSPITAL_COMMUNITY)
Admission: RE | Admit: 2021-10-31 | Discharge: 2021-10-31 | Disposition: A | Discharge status: Home / Self Care | Payer: Medicare HMO | Source: Ambulatory Visit | Attending: Hematology and Oncology | Admitting: Hematology and Oncology

## 2021-10-31 DIAGNOSIS — Z170 Estrogen receptor positive status [ER+]: Secondary | ICD-10-CM

## 2021-10-31 DIAGNOSIS — E785 Hyperlipidemia, unspecified: Secondary | ICD-10-CM | POA: Diagnosis not present

## 2021-10-31 DIAGNOSIS — I5020 Unspecified systolic (congestive) heart failure: Secondary | ICD-10-CM | POA: Diagnosis not present

## 2021-10-31 DIAGNOSIS — C50212 Malignant neoplasm of upper-inner quadrant of left female breast: Secondary | ICD-10-CM | POA: Diagnosis not present

## 2021-10-31 DIAGNOSIS — Z0189 Encounter for other specified special examinations: Secondary | ICD-10-CM

## 2021-10-31 LAB — ECHOCARDIOGRAM COMPLETE
Area-P 1/2: 3.01 cm2
S' Lateral: 2.2 cm

## 2021-10-31 NOTE — Progress Notes (Signed)
  Echocardiogram 2D Echocardiogram has been performed.  Darlina Sicilian M 10/31/2021, 10:53 AM

## 2021-11-01 ENCOUNTER — Telehealth: Payer: Self-pay

## 2021-11-01 NOTE — Telephone Encounter (Signed)
[employer] 2021-05 - Specimen Collection Study to Evaluate Biomarkers in Subjects with Cancer   Called patient to follow-up on above-listed study. Patient has not yet received consents in the mail. Will follow-up with patient next week.  Vickii Penna, RN, BSN, CPN Clinical Research Nurse I [phone]  11/01/2021 12:45 PM

## 2021-11-05 ENCOUNTER — Telehealth: Payer: Self-pay | Admitting: *Deleted

## 2021-11-05 NOTE — Telephone Encounter (Signed)
Spoke to pt regarding questions r/t dx and treatment care plan. Discussed pathology, chemo/herceptin and fra fitting. Denies further questions or needs at this time. Encourage pt to call with further questions should they arise. Received verbal understanding. Contact information provided.

## 2021-11-06 ENCOUNTER — Telehealth: Payer: Self-pay

## 2021-11-06 NOTE — Telephone Encounter (Signed)
[employer] 2021-05 - Specimen Collection Study to Evaluate Biomarkers in Subjects with Cancer   Called patient to follow-up on above-listed study. LVM with call-back information.  Vickii Penna, RN, BSN, CPN Clinical Research Nurse I [phone]  11/06/2021 9:36 AM

## 2021-11-06 NOTE — Telephone Encounter (Signed)
[employer] 2021-05 - Specimen Collection Study to Evaluate Biomarkers in Subjects with Cancer   Patient returned call and stated she was not interested in participating in above-listed study. Patient thanked for her time and consideration.  Vickii Penna, RN, BSN, CPN Clinical Research Nurse I [phone]  11/06/2021 3:17 PM

## 2021-11-07 DIAGNOSIS — C50912 Malignant neoplasm of unspecified site of left female breast: Secondary | ICD-10-CM | POA: Diagnosis not present

## 2021-11-08 ENCOUNTER — Encounter (HOSPITAL_BASED_OUTPATIENT_CLINIC_OR_DEPARTMENT_OTHER): Payer: Self-pay | Admitting: General Surgery

## 2021-11-08 ENCOUNTER — Other Ambulatory Visit: Payer: Self-pay

## 2021-11-09 NOTE — Progress Notes (Signed)
      Enhanced Recovery after Surgery Enhanced Recovery after Surgery is a protocol used to improve the stress on your body and your recovery after surgery.  Patient Instructions  The night before surgery:  No food after midnight. ONLY clear liquids after midnight  The day of surgery (if you do NOT have diabetes):  Drink ONE (1) Pre-Surgery Clear Ensure as directed.   This drink was given to you during your hospital  pre-op appointment visit. The pre-op nurse will instruct you on the time to drink the  Pre-Surgery Ensure depending on your surgery time. Finish the drink at the designated time by the pre-op nurse.  Nothing else to drink after completing the  Pre-Surgery Clear Ensure.  The day of surgery (if you have diabetes): Drink ONE (1) Gatorade 2 (G2) as directed. This drink was given to you during your hospital  pre-op appointment visit.  The pre-op nurse will instruct you on the time to drink the   Gatorade 2 (G2) depending on your surgery time. Color of the Gatorade may vary. Red is not allowed. Nothing else to drink after completing the  Gatorade 2 (G2).         If you have questions, please contact your surgeon's office.  Surgical soap given with written instructions, pt verbalized understandings

## 2021-11-13 ENCOUNTER — Other Ambulatory Visit: Payer: Self-pay | Admitting: General Surgery

## 2021-11-13 DIAGNOSIS — Z170 Estrogen receptor positive status [ER+]: Secondary | ICD-10-CM

## 2021-11-15 ENCOUNTER — Ambulatory Visit (HOSPITAL_BASED_OUTPATIENT_CLINIC_OR_DEPARTMENT_OTHER): Payer: Medicare HMO | Admitting: Anesthesiology

## 2021-11-15 ENCOUNTER — Ambulatory Visit (HOSPITAL_COMMUNITY): Payer: Medicare HMO

## 2021-11-15 ENCOUNTER — Observation Stay (HOSPITAL_BASED_OUTPATIENT_CLINIC_OR_DEPARTMENT_OTHER)
Admission: RE | Admit: 2021-11-15 | Discharge: 2021-11-16 | Disposition: A | Discharge status: Home / Self Care | Payer: Medicare HMO | Attending: General Surgery | Admitting: General Surgery

## 2021-11-15 ENCOUNTER — Other Ambulatory Visit: Payer: Self-pay

## 2021-11-15 ENCOUNTER — Encounter (HOSPITAL_BASED_OUTPATIENT_CLINIC_OR_DEPARTMENT_OTHER)
Admission: RE | Disposition: A | Discharge status: Home / Self Care | Payer: Self-pay | Source: Home / Self Care | Attending: General Surgery

## 2021-11-15 ENCOUNTER — Encounter (HOSPITAL_BASED_OUTPATIENT_CLINIC_OR_DEPARTMENT_OTHER): Payer: Self-pay | Admitting: General Surgery

## 2021-11-15 DIAGNOSIS — C50911 Malignant neoplasm of unspecified site of right female breast: Secondary | ICD-10-CM

## 2021-11-15 DIAGNOSIS — Z859 Personal history of malignant neoplasm, unspecified: Secondary | ICD-10-CM | POA: Insufficient documentation

## 2021-11-15 DIAGNOSIS — H7093 Unspecified mastoiditis, bilateral: Secondary | ICD-10-CM | POA: Diagnosis not present

## 2021-11-15 DIAGNOSIS — C50912 Malignant neoplasm of unspecified site of left female breast: Secondary | ICD-10-CM | POA: Diagnosis not present

## 2021-11-15 DIAGNOSIS — Z170 Estrogen receptor positive status [ER+]: Secondary | ICD-10-CM | POA: Diagnosis not present

## 2021-11-15 DIAGNOSIS — C50919 Malignant neoplasm of unspecified site of unspecified female breast: Secondary | ICD-10-CM | POA: Diagnosis present

## 2021-11-15 DIAGNOSIS — C50212 Malignant neoplasm of upper-inner quadrant of left female breast: Secondary | ICD-10-CM | POA: Diagnosis not present

## 2021-11-15 DIAGNOSIS — G8918 Other acute postprocedural pain: Secondary | ICD-10-CM | POA: Diagnosis not present

## 2021-11-15 HISTORY — PX: BREAST LUMPECTOMY WITH AXILLARY LYMPH NODE BIOPSY: SHX5593

## 2021-11-15 HISTORY — DX: Other specified postprocedural states: R11.2

## 2021-11-15 HISTORY — PX: PORTACATH PLACEMENT: SHX2246

## 2021-11-15 HISTORY — DX: Other specified postprocedural states: Z98.890

## 2021-11-15 SURGERY — BREAST LUMPECTOMY WITH AXILLARY LYMPH NODE BIOPSY
Anesthesia: Regional | Site: Chest | Laterality: Right

## 2021-11-15 MED ORDER — CHLORHEXIDINE GLUCONATE CLOTH 2 % EX PADS: 6.0000 | MEDICATED_PAD | Freq: Once | CUTANEOUS | Status: DC

## 2021-11-15 MED ORDER — FENTANYL CITRATE (PF) 100 MCG/2ML IJ SOLN
INTRAMUSCULAR | Status: AC
  Filled 2021-11-15: qty 2

## 2021-11-15 MED ORDER — SIMETHICONE 80 MG PO CHEW: 40.0000 mg | CHEWABLE_TABLET | Freq: Four times a day (QID) | ORAL | Status: DC | PRN

## 2021-11-15 MED ORDER — BUPIVACAINE HCL (PF) 0.25 % IJ SOLN
INTRAMUSCULAR | Status: DC | PRN
  Administered 2021-11-15: 10 mL

## 2021-11-15 MED ORDER — PROCHLORPERAZINE MALEATE 10 MG PO TABS
10.0000 mg | ORAL_TABLET | Freq: Four times a day (QID) | ORAL | Status: DC | PRN
Start: 2021-11-15 — End: 2021-11-16
  Filled 2021-11-15: qty 1

## 2021-11-15 MED ORDER — LACTATED RINGERS IV SOLN: INTRAVENOUS | Status: DC

## 2021-11-15 MED ORDER — AMISULPRIDE (ANTIEMETIC) 5 MG/2ML IV SOLN
5.0000 mg | Freq: Once | INTRAVENOUS | Status: AC
Start: 2021-11-15 — End: 2021-11-15
  Administered 2021-11-15: 5 mg via INTRAVENOUS

## 2021-11-15 MED ORDER — FENTANYL CITRATE (PF) 100 MCG/2ML IJ SOLN
INTRAMUSCULAR | Status: DC | PRN
Start: 2021-11-15 — End: 2021-11-15
  Administered 2021-11-15 (×4): 25 ug via INTRAVENOUS

## 2021-11-15 MED ORDER — ROPIVACAINE HCL 5 MG/ML IJ SOLN
INTRAMUSCULAR | Status: DC | PRN
  Administered 2021-11-15: 30 mL

## 2021-11-15 MED ORDER — PROPOFOL 10 MG/ML IV BOLUS
INTRAVENOUS | Status: AC
  Filled 2021-11-15: qty 20

## 2021-11-15 MED ORDER — ACETAMINOPHEN 500 MG PO TABS: 1000.0000 mg | ORAL_TABLET | Freq: Four times a day (QID) | ORAL | Status: DC

## 2021-11-15 MED ORDER — CEFAZOLIN SODIUM-DEXTROSE 2-4 GM/100ML-% IV SOLN
INTRAVENOUS | Status: AC
  Filled 2021-11-15: qty 100

## 2021-11-15 MED ORDER — ONDANSETRON HCL 4 MG/2ML IJ SOLN
INTRAMUSCULAR | Status: AC
  Filled 2021-11-15: qty 2

## 2021-11-15 MED ORDER — ACETAMINOPHEN 10 MG/ML IV SOLN: 1000.0000 mg | Freq: Once | INTRAVENOUS | Status: DC | PRN

## 2021-11-15 MED ORDER — MIDAZOLAM HCL 2 MG/2ML IJ SOLN
1.0000 mg | Freq: Once | INTRAMUSCULAR | Status: AC
  Administered 2021-11-15: 1 mg via INTRAVENOUS

## 2021-11-15 MED ORDER — ONDANSETRON 4 MG PO TBDP: 4.0000 mg | ORAL_TABLET | Freq: Four times a day (QID) | ORAL | Status: DC | PRN

## 2021-11-15 MED ORDER — HEPARIN (PORCINE) IN NACL 1000-0.9 UT/500ML-% IV SOLN
INTRAVENOUS | Status: AC
  Filled 2021-11-15: qty 500

## 2021-11-15 MED ORDER — TRAMADOL HCL 50 MG PO TABS: 50.0000 mg | ORAL_TABLET | Freq: Four times a day (QID) | ORAL | 0 refills | Status: DC | PRN

## 2021-11-15 MED ORDER — ACETAMINOPHEN 500 MG PO TABS
ORAL_TABLET | ORAL | Status: AC
Start: 2021-11-15 — End: ?
  Filled 2021-11-15: qty 2

## 2021-11-15 MED ORDER — CEFAZOLIN SODIUM-DEXTROSE 2-4 GM/100ML-% IV SOLN
2.0000 g | INTRAVENOUS | Status: AC
  Administered 2021-11-15: 2 g via INTRAVENOUS

## 2021-11-15 MED ORDER — ONDANSETRON HCL 4 MG/2ML IJ SOLN
4.0000 mg | Freq: Four times a day (QID) | INTRAMUSCULAR | Status: DC | PRN
  Administered 2021-11-15: 4 mg via INTRAVENOUS
  Filled 2021-11-15: qty 2

## 2021-11-15 MED ORDER — AMISULPRIDE (ANTIEMETIC) 5 MG/2ML IV SOLN
INTRAVENOUS | Status: AC
  Filled 2021-11-15: qty 2

## 2021-11-15 MED ORDER — LIDOCAINE 2% (20 MG/ML) 5 ML SYRINGE
INTRAMUSCULAR | Status: AC
  Filled 2021-11-15: qty 5

## 2021-11-15 MED ORDER — ENSURE PRE-SURGERY PO LIQD
296.0000 mL | Freq: Once | ORAL | Status: AC
  Administered 2021-11-16: 296 mL via ORAL

## 2021-11-15 MED ORDER — LIDOCAINE 2% (20 MG/ML) 5 ML SYRINGE
INTRAMUSCULAR | Status: DC | PRN
  Administered 2021-11-15: 50 mg via INTRAVENOUS

## 2021-11-15 MED ORDER — DEXAMETHASONE SODIUM PHOSPHATE 10 MG/ML IJ SOLN
INTRAMUSCULAR | Status: AC
Start: 2021-11-15 — End: ?
  Filled 2021-11-15: qty 1

## 2021-11-15 MED ORDER — DEXAMETHASONE SODIUM PHOSPHATE 10 MG/ML IJ SOLN
INTRAMUSCULAR | Status: DC | PRN
  Administered 2021-11-15: 5 mg

## 2021-11-15 MED ORDER — HEPARIN SOD (PORK) LOCK FLUSH 100 UNIT/ML IV SOLN
INTRAVENOUS | Status: AC
  Filled 2021-11-15: qty 5

## 2021-11-15 MED ORDER — PHENYLEPHRINE 80 MCG/ML (10ML) SYRINGE FOR IV PUSH (FOR BLOOD PRESSURE SUPPORT)
PREFILLED_SYRINGE | INTRAVENOUS | Status: AC
  Filled 2021-11-15: qty 10

## 2021-11-15 MED ORDER — MIDAZOLAM HCL 2 MG/2ML IJ SOLN
INTRAMUSCULAR | Status: AC
  Filled 2021-11-15: qty 2

## 2021-11-15 MED ORDER — HEPARIN SOD (PORK) LOCK FLUSH 100 UNIT/ML IV SOLN
INTRAVENOUS | Status: DC | PRN
  Administered 2021-11-15: 400 [IU]

## 2021-11-15 MED ORDER — FENTANYL CITRATE (PF) 100 MCG/2ML IJ SOLN
50.0000 ug | Freq: Once | INTRAMUSCULAR | Status: AC
  Administered 2021-11-15: 50 ug via INTRAVENOUS

## 2021-11-15 MED ORDER — FENTANYL CITRATE (PF) 100 MCG/2ML IJ SOLN
25.0000 ug | INTRAMUSCULAR | Status: DC | PRN
  Administered 2021-11-15: 25 ug via INTRAVENOUS

## 2021-11-15 MED ORDER — DEXAMETHASONE SODIUM PHOSPHATE 10 MG/ML IJ SOLN
INTRAMUSCULAR | Status: DC | PRN
  Administered 2021-11-15: 10 mg via INTRAVENOUS

## 2021-11-15 MED ORDER — BUPIVACAINE HCL (PF) 0.25 % IJ SOLN
INTRAMUSCULAR | Status: AC
  Filled 2021-11-15: qty 150

## 2021-11-15 MED ORDER — MAGTRACE LYMPHATIC TRACER
INTRAMUSCULAR | Status: DC | PRN
  Administered 2021-11-15: 2 mL via INTRAMUSCULAR

## 2021-11-15 MED ORDER — ONDANSETRON HCL 4 MG/2ML IJ SOLN
INTRAMUSCULAR | Status: DC | PRN
  Administered 2021-11-15: 4 mg via INTRAVENOUS

## 2021-11-15 MED ORDER — ACETAMINOPHEN 500 MG PO TABS
1000.0000 mg | ORAL_TABLET | ORAL | Status: AC
  Administered 2021-11-15: 1000 mg via ORAL

## 2021-11-15 MED ORDER — HEPARIN (PORCINE) IN NACL 2-0.9 UNITS/ML
INTRAMUSCULAR | Status: AC | PRN
  Administered 2021-11-15: 1

## 2021-11-15 MED ORDER — PROPOFOL 10 MG/ML IV BOLUS
INTRAVENOUS | Status: DC | PRN
  Administered 2021-11-15 (×2): 100 mg via INTRAVENOUS

## 2021-11-15 MED ORDER — SODIUM CHLORIDE 0.9 % IV SOLN: INTRAVENOUS | Status: DC

## 2021-11-15 MED ORDER — PROCHLORPERAZINE EDISYLATE 10 MG/2ML IJ SOLN: 5.0000 mg | Freq: Four times a day (QID) | INTRAMUSCULAR | Status: DC | PRN

## 2021-11-15 SURGICAL SUPPLY — 61 items
APPLIER CLIP 9.375 MED OPEN (MISCELLANEOUS) ×2
BAG DECANTER FOR FLEXI CONT (MISCELLANEOUS) ×2 IMPLANT
BINDER BREAST LRG (GAUZE/BANDAGES/DRESSINGS) IMPLANT
BINDER BREAST MEDIUM (GAUZE/BANDAGES/DRESSINGS) IMPLANT
BLADE SURG 11 STRL SS (BLADE) ×2 IMPLANT
BLADE SURG 15 STRL LF DISP TIS (BLADE) ×2 IMPLANT
BLADE SURG 15 STRL SS (BLADE) ×2
CANISTER SUCT 1200ML W/VALVE (MISCELLANEOUS) IMPLANT
CHLORAPREP W/TINT 26 (MISCELLANEOUS) ×2 IMPLANT
CLIP APPLIE 9.375 MED OPEN (MISCELLANEOUS) IMPLANT
COVER BACK TABLE 60X90IN (DRAPES) ×2 IMPLANT
COVER MAYO STAND STRL (DRAPES) ×2 IMPLANT
COVER PROBE 5X48 (MISCELLANEOUS) ×2
COVER PROBE W GEL 5X96 (DRAPES) ×2 IMPLANT
DERMABOND ADVANCED (GAUZE/BANDAGES/DRESSINGS) ×4
DERMABOND ADVANCED .7 DNX12 (GAUZE/BANDAGES/DRESSINGS) ×2 IMPLANT
DRAPE C-ARM 42X72 X-RAY (DRAPES) ×2 IMPLANT
DRAPE LAPAROSCOPIC ABDOMINAL (DRAPES) ×2 IMPLANT
DRAPE U-SHAPE 76X120 STRL (DRAPES) IMPLANT
DRAPE UTILITY XL STRL (DRAPES) ×2 IMPLANT
ELECT COATED BLADE 2.86 ST (ELECTRODE) ×2 IMPLANT
ELECT REM PT RETURN 9FT ADLT (ELECTROSURGICAL) ×2
ELECTRODE REM PT RTRN 9FT ADLT (ELECTROSURGICAL) ×2 IMPLANT
GAUZE 4X4 16PLY ~~LOC~~+RFID DBL (SPONGE) ×2 IMPLANT
GAUZE SPONGE 4X4 12PLY STRL LF (GAUZE/BANDAGES/DRESSINGS) ×2 IMPLANT
GLOVE BIO SURGEON STRL SZ7 (GLOVE) ×2 IMPLANT
GLOVE BIOGEL PI IND STRL 7.5 (GLOVE) ×2 IMPLANT
GLOVE BIOGEL PI INDICATOR 7.5 (GLOVE) ×2
GOWN STRL REUS W/ TWL LRG LVL3 (GOWN DISPOSABLE) ×6 IMPLANT
GOWN STRL REUS W/TWL LRG LVL3 (GOWN DISPOSABLE) ×6
HEMOSTAT ARISTA ABSORB 3G PWDR (HEMOSTASIS) IMPLANT
KIT CVR 48X5XPRB PLUP LF (MISCELLANEOUS) IMPLANT
KIT MARKER MARGIN INK (KITS) ×2 IMPLANT
KIT PORT POWER 8FR ISP CVUE (Port) IMPLANT
NDL HYPO 25X1 1.5 SAFETY (NEEDLE) ×4 IMPLANT
NDL SAFETY ECLIP 18X1.5 (MISCELLANEOUS) ×2 IMPLANT
NEEDLE HYPO 25X1 1.5 SAFETY (NEEDLE) ×4 IMPLANT
NS IRRIG 1000ML POUR BTL (IV SOLUTION) IMPLANT
PACK BASIN DAY SURGERY FS (CUSTOM PROCEDURE TRAY) ×2 IMPLANT
PENCIL SMOKE EVACUATOR (MISCELLANEOUS) ×2 IMPLANT
RETRACTOR ONETRAX LX 90X20 (MISCELLANEOUS) IMPLANT
SLEEVE SCD COMPRESS KNEE MED (STOCKING) ×2 IMPLANT
SPONGE T-LAP 18X18 ~~LOC~~+RFID (SPONGE) IMPLANT
SPONGE T-LAP 4X18 ~~LOC~~+RFID (SPONGE) ×2 IMPLANT
STRIP CLOSURE SKIN 1/2X4 (GAUZE/BANDAGES/DRESSINGS) ×2 IMPLANT
SUT MNCRL AB 4-0 PS2 18 (SUTURE) ×2 IMPLANT
SUT MON AB 5-0 PS2 18 (SUTURE) IMPLANT
SUT PROLENE 2 0 SH DA (SUTURE) ×2 IMPLANT
SUT SILK 2 0 SH (SUTURE) IMPLANT
SUT VIC AB 2-0 SH 27 (SUTURE) ×2
SUT VIC AB 2-0 SH 27XBRD (SUTURE) ×2 IMPLANT
SUT VIC AB 3-0 SH 27 (SUTURE) ×2
SUT VIC AB 3-0 SH 27X BRD (SUTURE) ×2 IMPLANT
SUT VIC AB 5-0 PS2 18 (SUTURE) IMPLANT
SYR 5ML LUER SLIP (SYRINGE) ×2 IMPLANT
SYR CONTROL 10ML LL (SYRINGE) ×4 IMPLANT
TOWEL GREEN STERILE FF (TOWEL DISPOSABLE) ×2 IMPLANT
TRACER MAGTRACE VIAL (MISCELLANEOUS) IMPLANT
TRAY FAXITRON CT DISP (TRAY / TRAY PROCEDURE) IMPLANT
TUBE CONNECTING 20X1/4 (TUBING) IMPLANT
YANKAUER SUCT BULB TIP NO VENT (SUCTIONS) IMPLANT

## 2021-11-15 NOTE — Anesthesia Procedure Notes (Signed)
Anesthesia Regional Block: Pectoralis block   Pre-Anesthetic Checklist: , timeout performed,  Correct Patient, Correct Site, Correct Laterality,  Correct Procedure, Correct Position, site marked,  Risks and benefits discussed,  Surgical consent,  Pre-op evaluation,  At surgeon's request and post-op pain management  Laterality: Left  Prep: Dura Prep       Needles:  Injection technique: Single-shot  Needle Type: Echogenic Stimulator Needle     Needle Length: 5cm  Needle Gauge: 20     Additional Needles:   Procedures:,,,, ultrasound used (permanent image in chart),,    Narrative:  Start time: 11/15/2021 10:43 AM End time: 11/15/2021 10:46 AM Injection made incrementally with aspirations every 5 mL.  Performed by: Personally  Anesthesiologist: Darral Dash, DO  Additional Notes: Patient identified. Risks/Benefits/Options discussed with patient including but not limited to bleeding, infection, nerve damage, failed block, incomplete pain control. Patient expressed understanding and wished to proceed. All questions were answered. Sterile technique was used throughout the entire procedure. Please see nursing notes for vital signs. Aspirated in 5cc intervals with injection for negative confirmation. Patient was given instructions on fall risk and not to get out of bed. All questions and concerns addressed with instructions to call with any issues or inadequate analgesia.

## 2021-11-15 NOTE — Discharge Instructions (Addendum)
[facility] Phone Number [phone]  POST OP INSTRUCTIONS Take 400 mg of ibuprofen every 8 hours or 650 mg tylenol every 6 hours for next 72 hours then as needed. Use ice several times daily also.  A prescription for pain medication may be given to you upon discharge.  Take your pain medication as prescribed, if needed.  If narcotic pain medicine is not needed, then you may take acetaminophen (Tylenol), naprosyn (Alleve) or ibuprofen (Advil) as needed. Take your usually prescribed medications unless otherwise directed If you need a refill on your pain medication, please contact your pharmacy.  They will contact our office to request authorization.  Prescriptions will not be filled after 5pm or on week-ends. You should eat very light the first 24 hours after surgery, such as soup, crackers, pudding, etc.  Resume your normal diet the day after surgery. Most patients will experience some swelling and bruising in the breast.  Ice packs and a good support bra will help.  Wear the breast binder provided or a sports bra for 72 hours day and night.  After that wear a sports bra during the day until you return to the office. Swelling and bruising can take several days to resolve.  It is common to experience some constipation if taking pain medication after surgery.  Increasing fluid intake and taking a stool softener will usually help or prevent this problem from occurring.  A mild laxative (Milk of Magnesia or Miralax) should be taken according to package directions if there are no bowel movements after 48 hours. I used skin glue on the incision, you may shower in 24 hours.  The glue will flake off over the next 2-3 weeks.  Any sutures or staples will be removed at the office during your follow-up visit. ACTIVITIES:  You may resume regular daily activities (gradually increasing) beginning the next day.  Wearing a good support bra or sports bra minimizes pain and swelling.  You may have  sexual intercourse when it is comfortable. You may drive when you no longer are taking prescription pain medication, you can comfortably wear a seatbelt, and you can safely maneuver your car and apply brakes. RETURN TO WORK:  ______________________________________________________________________________________ Dennis Bast should see your doctor in the office for a follow-up appointment approximately two weeks after your surgery.  Your doctor's nurse will typically make your follow-up appointment when she calls you with your pathology report.  Expect your pathology report 3-4 business days after your surgery.  You may call to check if you do not hear from [place] after three days. OTHER INSTRUCTIONS: _______________________________________________________________________________________________ _____________________________________________________________________________________________________________________________________ _____________________________________________________________________________________________________________________________________ _____________________________________________________________________________________________________________________________________  WHEN TO CALL DR WAKEFIELD: Fever over 101.0 Nausea and/or vomiting. Extreme swelling or bruising. Continued bleeding from incision. Increased pain, redness, or drainage from the incision.  The clinic staff is available to answer your questions during regular business hours.  Please don't hesitate to call and ask to speak to one of the nurses for clinical concerns.  If you have a medical emergency, go to the nearest emergency room or call 911.  A surgeon from [facility] is always on call at the hospital.  For further questions, please visit [email or web address]  Post Anesthesia Home Care Instructions  Activity: Get plenty of rest for the remainder of the day. A responsible individual must stay with  you for 24 hours following the procedure.  For the next 24 hours, DO NOT: -Drive a car -Paediatric nurse -Drink alcoholic beverages -Take any medication unless instructed by your  physician -Make any legal decisions or sign important papers.  Meals: Start with liquid foods such as gelatin or soup. Progress to regular foods as tolerated. Avoid greasy, spicy, heavy foods. If nausea and/or vomiting occur, drink only clear liquids until the nausea and/or vomiting subsides. Call your physician if vomiting continues.  Special Instructions/Symptoms: Your throat may feel dry or sore from the anesthesia or the breathing tube placed in your throat during surgery. If this causes discomfort, gargle with warm salt water. The discomfort should disappear within 24 hours.  If you had a scopolamine patch placed behind your ear for the management of post- operative nausea and/or vomiting:  1. The medication in the patch is effective for 72 hours, after which it should be removed.  Wrap patch in a tissue and discard in the trash. Wash hands thoroughly with soap and water. 2. You may remove the patch earlier than 72 hours if you experience unpleasant side effects which may include dry mouth, dizziness or visual disturbances. 3. Avoid touching the patch. Wash your hands with soap and water after contact with the patch.     *May have Tylenol today at 4:40pm

## 2021-11-15 NOTE — Transfer of Care (Signed)
Immediate Anesthesia Transfer of Care Note  Patient: Patricia Marshall  Procedure(s) Performed: LEFT BREAST LUMPECTOMY WITH AXILLARY SENTINEL LYMPH NODE BIOPSY (Left: Breast) INSERTION PORT-A-CATH (Right: Chest)  Patient Location: PACU  Anesthesia Type:GA combined with regional for post-op pain  Level of Consciousness: drowsy and patient cooperative  Airway & Oxygen Therapy: Patient Spontanous Breathing and Patient connected to face mask oxygen  Post-op Assessment: Report given to RN and Post -op Vital signs reviewed and stable  Post vital signs: Reviewed and stable  Last Vitals:  Vitals Value Taken Time  BP 133/64 11/15/21 1304  Temp    Pulse 52 11/15/21 1306  Resp 13 11/15/21 1306  SpO2 98 % 11/15/21 1306  Vitals shown include unvalidated device data.  Last Pain:  Vitals:   11/15/21 1034  TempSrc: Oral  PainSc: 0-No pain      Patients Stated Pain Goal: 3 (73/71/06 2694)  Complications: No notable events documented.

## 2021-11-15 NOTE — H&P (Signed)
89 yof who has no prior breast history. She has no dc. She has screening detected left breast mass in medial breast. This is 2x1.8x1.4 cm. There was also abnormal mass 1.2x1.2x0.7 cm in low axilla that is breast tissue/pash and concordant per Dr Owens Shark. This was biopsied and is grade II IDC that is er pos 90%, pr negative, her 2 positive and Ki is 30%. She has d density breasts. She is here with her husband to discuss options  Review of Systems: A complete review of systems was obtained from the patient. I have reviewed this information and discussed as appropriate with the patient. See HPI as well for other ROS.  Review of Systems  All other systems reviewed and are negative.   Medical History: Past Medical History:  Diagnosis Date  Anxiety  History of cancer  Hyperlipidemia   Patient Active Problem List  Diagnosis  Malignant neoplasm of upper-inner quadrant of left breast in female, estrogen receptor positive (CMS-HCC)  Degenerative arthritis of left knee   Past Surgical History:  Procedure Laterality Date  APPENDECTOMY  1959  Back Surgery  Unknown Date  TONSILLECTOMY AND ADENOIDECTOMY N/A  1954   Allergies  Allergen Reactions  Ampicillin Other (See Comments)  Drug fever Did it involve swelling of the face/tongue/throat, SOB, or low BP? No Did it involve sudden or severe rash/hives, skin peeling, or any reaction on the inside of your mouth or nose? No Did you need to seek medical attention at a hospital or doctor's office? No When did it last happen? Over 10 years ago If all above answers are "NO", may proceed with cephalosporin use.   Current Outpatient Medications on File Prior to Visit  Medication Sig Dispense Refill  trimethoprim-polymyxin b (POLYTRIM) ophthalmic solution Apply to eye  cholecalciferol (VITAMIN D3) 2,000 unit tablet Take by mouth    Family History  Problem Relation Age of Onset  Stomach cancer Mother  Coronary Artery Disease (Blocked arteries  around heart) Father  Myocardial Infarction (Heart attack) Father  Coronary Artery Disease (Blocked arteries around heart) Brother  Myocardial Infarction (Heart attack) Brother    Social History   Tobacco Use  Smoking Status Never  Smokeless Tobacco Never  Marital status: Married  Tobacco Use  Smoking status: Never  Smokeless tobacco: Never  Vaping Use  Vaping Use: Never used  Substance and Sexual Activity  Alcohol use: Yes  Comment: "social"  Drug use: Never   Objective:  Physical Exam Vitals reviewed.  Constitutional:  Appearance: Normal appearance.  Chest:  Breasts: Right: No inverted nipple, mass or nipple discharge.  Left: Mass present. No inverted nipple or nipple discharge.  Comments: 2 cm uiq mass Lymphadenopathy:  Upper Body:  Right upper body: No supraclavicular or axillary adenopathy.  Left upper body: No supraclavicular or axillary adenopathy.  Neurological:  Mental Status: She is alert.   Assessment and Plan:   Malignant neoplasm of upper-inner quadrant of left breast in female, estrogen receptor positive (CMS-HCC)  Left breast lumpectomy, left ax sn biopsy, port placement  We discussed the staging and pathophysiology of breast cancer. We discussed all of the different options for treatment for breast cancer including surgery, chemotherapy, radiation therapy, Herceptin, and antiestrogen therapy. We discussed neoadjuvant therapy as option but elected to proceed with surgery.   We discussed a sentinel lymph node biopsy as she does not appear to having lymph node involvement right now. We discussed the performance of that with injection of radioactive tracer. We discussed that there is a  chance of having a positive node with a sentinel lymph node biopsy and we will await the permanent pathology to make any other first further decisions in terms of her treatment. We discussed up to a 5% risk lifetime of chronic shoulder pain as well as lymphedema associated  with a sentinel lymph node biopsy.  We discussed the options for treatment of the breast cancer which included lumpectomy versus a mastectomy. We discussed a 5-10% chance of a positive margin requiring reexcision in the operating room. We also discussed that she will likely need radiation therapy if she undergoes lumpectomy. We discussed mastectomy and the postoperative care for that as well. Mastectomy can be followed by reconstruction. She is going to get systemic her 2 based therapy. I discussed port placement with her. Most mastectomy patients will not need radiation therapy. We discussed that there is no difference in her survival whether she undergoes lumpectomy with radiation therapy or antiestrogen therapy versus a mastectomy. There is also no real difference between her recurrence in the breast.  We discussed the risks of operation including bleeding, infection, possible reoperation. She understands her further therapy will be based on what her stages at the time of her operation.

## 2021-11-15 NOTE — Op Note (Addendum)
Preoperative diagnosis: Clinical stage II, HER2 positive left breast cancer Postoperative diagnosis: Same as above Procedure: 1.  Left breast ultrasound-guided lumpectomy 2.  Injection of mag trace for sentinel lymph node identification 3.  Left deep axillary sentinel lymph node biopsy 4.  Insertion of right internal jugular Port-A-Cath with ultrasound guidance Surgeon: Dr. Serita Grammes Anesthesia: General with a pectoral block Estimated blood loss: Minimal Complications: None Drains: None Sponge and needle count was correct completion Specimens: 1.  Left breast mass marked with paint, seed and specimen 2.  Left deep axillary sentinel lymph nodes with highest count of 135 Disposition patient taken to recovery room in stable condition  Indications: This is a 76 year old female who had a left breast mass noted on mammography and ultrasound.  This measures 2 cm in greatest dimension.  This was biopsied and was a grade 2 invasive ductal carcinoma that was ER positive, PR negative, and HER2 positive.  She has been seen in a multidisciplinary fashion.  We elected to proceed with surgery and place a port for adjuvant therapy.  Procedure: After informed consent was obtained I first injected 2 cc of mag trace in the subareolar position in the preoperative area after doing a timeout.  She subsequently underwent a pectoral block.  She was then given antibiotics.  SCDs were in place.  She was taken to the operating room and placed under anesthesia without complication.  She was prepped and draped in the standard sterile surgical fashion.  Surgical timeout was then performed.  I first placed a port.  I identified the internal jugular vein on the right side.  I then made a small nick in the skin.  I accessed the vein under ultrasound guidance.  See the attached picture.  I then placed the wire.  This was confirmed to be in the correct position by fluoroscopy.  I then made an incision below the clavicle and  developed a pocket for the port.  I tunneled the line between the 2 sites.  I then placed the dilator over the wire under fluoroscopy.  I remove the wire assembly.  I then placed the line through the sheath.  I remove the peel-away sheath.  I pulled the line to be in the distal cava.  I confirmed this with fluoroscopy.  I then attached the port and sutured this into position with a 2-0 Prolene suture.  The port was functional and aspirated blood.  I placed heparin inside the port.  I then closed this with 3-0 Vicryl 4-0 Monocryl and glue.  I then did the lumpectomy.  I used ultrasound to identify the mass.  Identified the clip and the mass as well.  See the attached picture.  I then was able to make an elliptical incision overlying the mass as I want to remove the skin as it was tenting it.  I then remove the mass and normal tissue to get a clear margin.  This was done all the way down to the pectoralis muscle.  I then did a 3D CT scan that look like all my margins were clear.  I then placed a couple clips in the cavity.  I then mobilized the tissue and brought the breast tissue together with a 2-0 Vicryl.  The skin was closed with 3-0 Vicryl and 4-0 Monocryl.  Glue and Steri-Strips were placed.  I then made an incision in the low axilla where there was some tracer activity.  I dissected through the fascia.  I then identified what  looked to be a couple sentinel lymph nodes that had a very low level of activity.  There was no other abnormality in her axilla.  These were passed off the table as sentinel nodes.  I then obtained hemostasis.  I closed this with 2-0 Vicryl, 3-0 Vicryl, 4-0 Monocryl and glue.  She tolerated this well was extubated transferred to recovery stable.    Wire in right IJ above    US guided lumpectomy, mass with clip

## 2021-11-15 NOTE — Interval H&P Note (Signed)
History and Physical Interval Note:  11/15/2021 10:20 AM  Patricia Marshall  has presented today for surgery, with the diagnosis of LEFT BREAST CANCER.  The various methods of treatment have been discussed with the patient and family. After consideration of risks, benefits and other options for treatment, the patient has consented to  Procedure(s): LEFT BREAST LUMPECTOMY WITH AXILLARY SENTINEL LYMPH NODE BIOPSY (Left) INSERTION PORT-A-CATH (N/A) as a surgical intervention.  The patient's history has been reviewed, patient examined, no change in status, stable for surgery.  I have reviewed the patient's chart and labs.  Questions were answered to the patient's satisfaction.     Rolm Bookbinder

## 2021-11-15 NOTE — Anesthesia Preprocedure Evaluation (Addendum)
Anesthesia Evaluation  Patient identified by MRN, date of birth, ID band Patient awake    Reviewed: Allergy & Precautions, NPO status , Patient's Chart, lab work & pertinent test results  History of Anesthesia Complications (+) PONV and history of anesthetic complications  Airway Mallampati: II  TM Distance: >3 FB Neck ROM: Full    Dental no notable dental hx.    Pulmonary neg pulmonary ROS,    Pulmonary exam normal        Cardiovascular negative cardio ROS   Rhythm:Regular Rate:Normal     Neuro/Psych negative neurological ROS  negative psych ROS   GI/Hepatic negative GI ROS, Neg liver ROS,   Endo/Other  negative endocrine ROS  Renal/GU negative Renal ROS  negative genitourinary   Musculoskeletal  (+) Arthritis , Osteoarthritis,  Left breast Ca   Abdominal Normal abdominal exam  (+)   Peds  Hematology negative hematology ROS (+)   Anesthesia Other Findings   Reproductive/Obstetrics                             Anesthesia Physical Anesthesia Plan  ASA: 2  Anesthesia Plan: General and Regional   Post-op Pain Management: Regional block*   Induction: Intravenous  PONV Risk Score and Plan: 4 or greater and Ondansetron, Dexamethasone, Midazolam and Treatment may vary due to age or medical condition  Airway Management Planned: Mask and LMA  Additional Equipment: None  Intra-op Plan:   Post-operative Plan: Extubation in OR  Informed Consent: I have reviewed the patients History and Physical, chart, labs and discussed the procedure including the risks, benefits and alternatives for the proposed anesthesia with the patient or authorized representative who has indicated his/her understanding and acceptance.     Dental advisory given  Plan Discussed with: CRNA  Anesthesia Plan Comments:         Anesthesia Quick Evaluation

## 2021-11-15 NOTE — Anesthesia Postprocedure Evaluation (Signed)
Anesthesia Post Note  Patient: Aryianna Aspasia Sill  Procedure(s) Performed: LEFT BREAST LUMPECTOMY WITH AXILLARY SENTINEL LYMPH NODE BIOPSY (Left: Breast) INSERTION PORT-A-CATH (Right: Chest)     Patient location during evaluation: PACU Anesthesia Type: Regional and General Level of consciousness: awake and alert Pain management: pain level controlled Vital Signs Assessment: post-procedure vital signs reviewed and stable Respiratory status: spontaneous breathing, nonlabored ventilation, respiratory function stable and patient connected to nasal cannula oxygen Cardiovascular status: blood pressure returned to baseline and stable Postop Assessment: no apparent nausea or vomiting Anesthetic complications: no   No notable events documented.  Last Vitals:  Vitals:   11/15/21 1430 11/15/21 1445  BP: 138/61   Pulse: (!) 56 61  Resp: 17 17  Temp:    SpO2: 97% 98%    Last Pain:  Vitals:   11/15/21 1445  TempSrc:   PainSc: 0-No pain                 Belenda Cruise P Tracey Hermance

## 2021-11-15 NOTE — Anesthesia Procedure Notes (Signed)
Procedure Name: LMA Insertion Date/Time: 11/15/2021 11:41 AM  Performed by: Genelle Bal, CRNAPre-anesthesia Checklist: Patient identified, Emergency Drugs available, Suction available and Patient being monitored Patient Re-evaluated:Patient Re-evaluated prior to induction Oxygen Delivery Method: Circle system utilized Preoxygenation: Pre-oxygenation with 100% oxygen Induction Type: IV induction Ventilation: Mask ventilation without difficulty LMA: LMA inserted LMA Size: 3.0 Number of attempts: 1 Airway Equipment and Method: Bite block Placement Confirmation: positive ETCO2 Tube secured with: Tape Dental Injury: Teeth and Oropharynx as per pre-operative assessment  Comments: Attempted #4 LMA with poor seal/etCO2. Removed, pt mask ventilated. #3 placed easily with +etCO2/BBS.

## 2021-11-16 ENCOUNTER — Telehealth: Payer: Self-pay | Admitting: Genetic Counselor

## 2021-11-16 ENCOUNTER — Ambulatory Visit: Payer: Self-pay | Admitting: Genetic Counselor

## 2021-11-16 ENCOUNTER — Encounter (HOSPITAL_BASED_OUTPATIENT_CLINIC_OR_DEPARTMENT_OTHER): Payer: Self-pay | Admitting: General Surgery

## 2021-11-16 DIAGNOSIS — Z170 Estrogen receptor positive status [ER+]: Secondary | ICD-10-CM | POA: Diagnosis not present

## 2021-11-16 DIAGNOSIS — Z1379 Encounter for other screening for genetic and chromosomal anomalies: Secondary | ICD-10-CM

## 2021-11-16 DIAGNOSIS — C50212 Malignant neoplasm of upper-inner quadrant of left female breast: Secondary | ICD-10-CM | POA: Diagnosis not present

## 2021-11-16 DIAGNOSIS — Z859 Personal history of malignant neoplasm, unspecified: Secondary | ICD-10-CM | POA: Diagnosis not present

## 2021-11-16 DIAGNOSIS — Z8041 Family history of malignant neoplasm of ovary: Secondary | ICD-10-CM

## 2021-11-16 MED ORDER — ONDANSETRON HCL 4 MG PO TABS: 4.0000 mg | ORAL_TABLET | Freq: Every day | ORAL | 0 refills | Status: DC | PRN

## 2021-11-16 MED ORDER — TRAMADOL HCL 50 MG PO TABS: 50.0000 mg | ORAL_TABLET | Freq: Four times a day (QID) | ORAL | Status: DC | PRN

## 2021-11-16 NOTE — Discharge Summary (Signed)
Physician Discharge Summary  Patient ID: Patricia Marshall MRN: [id number] DOB/AGE: 05-07-1945 76 y.o.  Admit date: 11/15/2021 Discharge date: 11/16/2021  Admission Diagnoses: Breast cancer Discharge Diagnoses:  Principal Problem:   Breast cancer [facility])   Discharged Condition: good  Hospital Course: 13 yof underwent port, left lumpectomy, left ax sn biopsy which went well. She had postop n/v not able to tolerate po and remained overnight. She is better following am and will be discahrged  Consults: None  Significant Diagnostic Studies: none  Treatments: surgery: left lumpectomy, left ax sn biopsy, port  Discharge Exam: Blood pressure (!) 122/59, pulse 60, temperature 98.7 F (37.1 C), resp. rate 16, height '5\' 5"'$  (1.651 m), weight 52.8 kg, SpO2 100 %.   Disposition: Discharge disposition: 01-Home or Self Care          Follow-up Information     Rolm Bookbinder, MD Follow up in 3 week(s).   Specialty: General Surgery Contact information: [address] [place] [address] [phone]                 Signed: Rolm Bookbinder 11/16/2021, 8:12 AM

## 2021-11-16 NOTE — Telephone Encounter (Signed)
Revealed negative results.

## 2021-11-19 ENCOUNTER — Emergency Department (HOSPITAL_COMMUNITY)
Admission: EM | Admit: 2021-11-19 | Discharge: 2021-11-19 | Disposition: A | Discharge status: Home / Self Care | Payer: Medicare HMO | Attending: Emergency Medicine | Admitting: Emergency Medicine

## 2021-11-19 ENCOUNTER — Emergency Department (HOSPITAL_BASED_OUTPATIENT_CLINIC_OR_DEPARTMENT_OTHER): Payer: Medicare HMO

## 2021-11-19 DIAGNOSIS — M62831 Muscle spasm of calf: Secondary | ICD-10-CM

## 2021-11-19 DIAGNOSIS — M7989 Other specified soft tissue disorders: Secondary | ICD-10-CM

## 2021-11-19 DIAGNOSIS — I82432 Acute embolism and thrombosis of left popliteal vein: Secondary | ICD-10-CM | POA: Diagnosis not present

## 2021-11-19 DIAGNOSIS — R2242 Localized swelling, mass and lump, left lower limb: Secondary | ICD-10-CM | POA: Diagnosis present

## 2021-11-19 DIAGNOSIS — Z853 Personal history of malignant neoplasm of breast: Secondary | ICD-10-CM | POA: Insufficient documentation

## 2021-11-19 LAB — BASIC METABOLIC PANEL
Anion gap: 13 (ref 5–15)
BUN: 16 mg/dL (ref 8–23)
CO2: 23 mmol/L (ref 22–32)
Calcium: 9.8 mg/dL (ref 8.9–10.3)
Chloride: 104 mmol/L (ref 98–111)
Creatinine, Ser: 0.58 mg/dL (ref 0.44–1.00)
GFR, Estimated: >60 mL/min (ref >60)
Glucose, Bld: 92 mg/dL (ref 70–99)
Potassium: 4 mmol/L (ref 3.5–5.1)
Sodium: 140 mmol/L (ref 135–145)

## 2021-11-19 LAB — CBC WITH DIFFERENTIAL/PLATELET
Abs Immature Granulocytes: 0.04 10*3/uL (ref 0.00–0.07)
Basophils Absolute: 0 10*3/uL (ref 0.0–0.1)
Basophils Relative: 1 %
Eosinophils Absolute: 0.1 10*3/uL (ref 0.0–0.5)
Eosinophils Relative: 1 %
HCT: 40 % (ref 36.0–46.0)
Hemoglobin: 13.8 g/dL (ref 12.0–15.0)
Immature Granulocytes: 1 %
Lymphocytes Relative: 26 %
Lymphs Abs: 2.3 10*3/uL (ref 0.7–4.0)
MCH: 29.9 pg (ref 26.0–34.0)
MCHC: 34.5 g/dL (ref 30.0–36.0)
MCV: 86.6 fL (ref 80.0–100.0)
Monocytes Absolute: 0.7 10*3/uL (ref 0.1–1.0)
Monocytes Relative: 8 %
Neutro Abs: 5.6 10*3/uL (ref 1.7–7.7)
Neutrophils Relative %: 63 %
Platelets: 98 10*3/uL — ABNORMAL LOW (ref 150–400)
RBC: 4.62 MIL/uL (ref 3.87–5.11)
RDW: 11.9 % (ref 11.5–15.5)
WBC: 8.7 10*3/uL (ref 4.0–10.5)
nRBC: 0 % (ref 0.0–0.2)

## 2021-11-19 MED ORDER — APIXABAN 5 MG PO TABS
10.0000 mg | ORAL_TABLET | Freq: Once | ORAL | Status: AC
  Administered 2021-11-19: 10 mg via ORAL
  Filled 2021-11-19: qty 2

## 2021-11-19 MED ORDER — APIXABAN (ELIQUIS) VTE STARTER PACK (10MG AND 5MG): ORAL_TABLET | ORAL | 0 refills | Status: DC

## 2021-11-19 MED ORDER — APIXABAN (ELIQUIS) EDUCATION KIT FOR DVT/PE PATIENTS
PACK | Freq: Once | Status: DC
  Filled 2021-11-19: qty 1

## 2021-11-19 MED ORDER — APIXABAN 5 MG PO TABS: 5.0000 mg | ORAL_TABLET | Freq: Two times a day (BID) | ORAL | Status: DC

## 2021-11-19 MED ORDER — APIXABAN 5 MG PO TABS: 10.0000 mg | ORAL_TABLET | Freq: Two times a day (BID) | ORAL | Status: DC

## 2021-11-19 NOTE — ED Notes (Signed)
DC instructions reviewed with pt. PT verbalized understanding. PT DC °

## 2021-11-19 NOTE — Discharge Instructions (Addendum)
Follow-up with your surgeon and your primary doctor.  Return to the ED as needed or for any new symptoms or concerns.  Information on my medicine - ELIQUIS (apixaban)  This medication education was reviewed with me or my healthcare representative as part of my discharge preparation.  Why was Eliquis prescribed for you? Eliquis was prescribed to treat blood clots that may have been found in the veins of your legs (deep vein thrombosis) or in your lungs (pulmonary embolism) and to reduce the risk of them occurring again.  What do You need to know about Eliquis ? The starting dose is 10 mg (two 5 mg tablets) taken TWICE daily for the FIRST SEVEN (7) DAYS, then on (enter date)  11/26/2021  the dose is reduced to ONE 5 mg tablet taken TWICE daily.  Eliquis may be taken with or without food.   Try to take the dose about the same time in the morning and in the evening. If you have difficulty swallowing the tablet whole please discuss with your pharmacist how to take the medication safely.  Take Eliquis exactly as prescribed and DO NOT stop taking Eliquis without talking to the doctor who prescribed the medication.  Stopping may increase your risk of developing a new blood clot.  Refill your prescription before you run out.  After discharge, you should have regular check-up appointments with your healthcare provider that is prescribing your Eliquis.    What do you do if you miss a dose? If a dose of ELIQUIS is not taken at the scheduled time, take it as soon as possible on the same day and twice-daily administration should be resumed. The dose should not be doubled to make up for a missed dose.  Important Safety Information A possible side effect of Eliquis is bleeding. You should call your healthcare provider right away if you experience any of the following: Bleeding from an injury or your nose that does not stop. Unusual colored urine (red or dark brown) or unusual colored stools (red or  black). Unusual bruising for unknown reasons. A serious fall or if you hit your head (even if there is no bleeding).  Some medicines may interact with Eliquis and might increase your risk of bleeding or clotting while on Eliquis. To help avoid this, consult your healthcare provider or pharmacist prior to using any new prescription or non-prescription medications, including herbals, vitamins, non-steroidal anti-inflammatory drugs (NSAIDs) and supplements.  This website has more information on Eliquis (apixaban): http://www.eliquis.com/eliquis/home

## 2021-11-19 NOTE — ED Triage Notes (Signed)
Pt c/o LLE/ankle swelling, associated soreness. Denies CP, SHOB. Hx breast cancer, lymphectomy 8/31. Denies issue w surgical site, noticed swelling 2 nights ago. States she wakes up w no swelling, swelling increases as day goes on.

## 2021-11-19 NOTE — ED Provider Notes (Signed)
[facility] EMERGENCY DEPARTMENT Provider Note   CSN: [id number] Arrival date & time: 11/19/21  1622     History  Chief Complaint  Patient presents with   Leg Swelling    Patricia Marshall is a 76 y.o. female. With past medical history of recently diagnosed breast cancer not currently on chemotherapy presenting with left lower extremity swelling.  This is been progressive over the past 2 days.  She denies any history of blood clots.  Denies any history of trauma to the area.  She did recently have lumpectomy 5 days ago and placement of port in preparation for chemotherapy.  She is not on anticoagulation. HPI     Home Medications Prior to Admission medications   Medication Sig Start Date End Date Taking? Authorizing Provider  APIXABAN (ELIQUIS) VTE STARTER PACK ($RemoveBefor'10MG'xFZKznfjLVWE$  AND $Re'5MG'CeU$ ) Take as directed on package: start with two-$RemoveBefore'5mg'YQQZkqrAqwJnw$  tablets twice daily for 7 days. On day 8, switch to one-$RemoveBefor'5mg'ZvuPPaXHvAzm$  tablet twice daily. 11/19/21  Yes Sherwood Gambler, MD  Cholecalciferol (VITAMIN D) 50 MCG (2000 UT) tablet Take 2,000 Units by mouth daily.    [provider]  ondansetron (ZOFRAN) 4 MG tablet Take 1 tablet (4 mg total) by mouth daily as needed for nausea or vomiting. 11/16/21 11/16/22  Rolm Bookbinder, MD  traMADol (ULTRAM) 50 MG tablet Take 1 tablet (50 mg total) by mouth every 6 (six) hours as needed. 11/15/21   Rolm Bookbinder, MD      Allergies    Other and Ampicillin    Review of Systems   Review of Systems  Constitutional:  Negative for chills and fever.  HENT:  Negative for ear pain and sore throat.   Eyes:  Negative for pain and visual disturbance.  Respiratory:  Negative for cough and shortness of breath.   Cardiovascular:  Positive for leg swelling. Negative for chest pain and palpitations.  Gastrointestinal:  Negative for abdominal pain and vomiting.  Genitourinary:  Negative for dysuria and hematuria.  Musculoskeletal:  Negative for arthralgias and back  pain.  Skin:  Negative for color change and rash.  Neurological:  Negative for seizures and syncope.  All other systems reviewed and are negative.   Physical Exam Updated Vital Signs BP (!) 167/73   Pulse 65   Temp 98 F (36.7 C) (Oral)   Resp 16   SpO2 98%  Physical Exam Vitals and nursing note reviewed.  Constitutional:      General: She is not in acute distress.    Appearance: She is well-developed.  HENT:     Head: Normocephalic and atraumatic.  Eyes:     Conjunctiva/sclera: Conjunctivae normal.  Cardiovascular:     Rate and Rhythm: Normal rate and regular rhythm.     Heart sounds: No murmur heard. Pulmonary:     Effort: Pulmonary effort is normal. No respiratory distress.     Breath sounds: Normal breath sounds.  Abdominal:     Palpations: Abdomen is soft.     Tenderness: There is no abdominal tenderness.  Musculoskeletal:        General: No swelling.     Cervical back: Neck supple.     Left lower leg: Edema (Swelling to left lower extremity from the knee to foot, 2+ DP pulse, sensation intact, nontender) present.  Skin:    General: Skin is warm and dry.     Capillary Refill: Capillary refill takes less than 2 seconds.     Comments: Well-appearing incision to right chest, left breast,  and left axilla  Neurological:     Mental Status: She is alert.  Psychiatric:        Mood and Affect: Mood normal.     ED Results / Procedures / Treatments   Labs (all labs ordered are listed, but only abnormal results are displayed) Labs Reviewed  CBC WITH DIFFERENTIAL/PLATELET - Abnormal; Notable for the following components:      Result Value   Platelets 98 (*)    All other components within normal limits  BASIC METABOLIC PANEL    EKG None  Radiology VAS [place] LOWER EXTREMITY VENOUS (DVT) (7a-7p)  Result Date: 11/19/2021  Lower Venous DVT Study Patient Name:  Patricia Marshall  Date of Exam:   11/19/2021 Medical Rec #: [id number]             Accession #:    [id number]  Date of Birth: 08-10-1945             Patient Gender: F Patient Age:   66 years Exam Location:  [facility] Procedure:      VAS [place] LOWER EXTREMITY VENOUS (DVT) Referring Phys: HALEY SAGE --------------------------------------------------------------------------------  Indications: Calf cramping, ankle swelling X 2 days. Lumpectomy 8/31.  Risk Factors: Cancer Breast. Comparison Study: No prior study Performing Technologist: Sharion Dove RVS  Examination Guidelines: A complete evaluation includes B-mode imaging, spectral Doppler, color Doppler, and power Doppler as needed of all accessible portions of each vessel. Bilateral testing is considered an integral part of a complete examination. Limited examinations for reoccurring indications may be performed as noted. The reflux portion of the exam is performed with the patient in reverse Trendelenburg.  +-----+---------------+---------+-----------+----------+--------------+ RIGHTCompressibilityPhasicitySpontaneityPropertiesThrombus Aging +-----+---------------+---------+-----------+----------+--------------+ CFV  Full           Yes      Yes                                 +-----+---------------+---------+-----------+----------+--------------+   +---------+---------------+---------+-----------+----------+----------------+ LEFT     CompressibilityPhasicitySpontaneityPropertiesThrombus Aging   +---------+---------------+---------+-----------+----------+----------------+ CFV      Full           Yes      Yes                                   +---------+---------------+---------+-----------+----------+----------------+ SFJ      Full                                                          +---------+---------------+---------+-----------+----------+----------------+ FV Prox  Full                                                          +---------+---------------+---------+-----------+----------+----------------+ FV Mid   Full                                                           +---------+---------------+---------+-----------+----------+----------------+  FV DistalFull                                                          +---------+---------------+---------+-----------+----------+----------------+ PFV      Full                                                          +---------+---------------+---------+-----------+----------+----------------+ POP      None           No       No                   Distal popliteal +---------+---------------+---------+-----------+----------+----------------+ PTV      None           No       No                   Acute            +---------+---------------+---------+-----------+----------+----------------+ PERO     None           No       No                   Acute            +---------+---------------+---------+-----------+----------+----------------+ Gastroc  Full                                                          +---------+---------------+---------+-----------+----------+----------------+    Summary: RIGHT: - No evidence of common femoral vein obstruction.  LEFT: - Findings consistent with acute deep vein thrombosis involving the left popliteal vein, left posterior tibial veins, and left peroneal veins. - No cystic structure found in the popliteal fossa.  *See table(s) above for measurements and observations.    Preliminary     Procedures Procedures    Medications Ordered in ED Medications  apixaban [facility]) Education Kit for DVT/PE patients (has no administration in time range)  apixaban (ELIQUIS) tablet 10 mg (10 mg Oral Given 11/19/21 2050)    ED Course/ Medical Decision Making/ A&P                           Medical Decision Making Risk Prescription drug management.   76 year old female with recently diagnosed breast cancer s/p lumpectomy and port placement 5 days ago not currently on chemotherapy presenting with left lower  extremity swelling over the past 2 days.  Patient denies any chest pain or shortness of breath.  On arrival she is stable vital signs.  Diagnosis includes DVT, lymphedema, fracture, contusion, rash, PE. Patient does not have any history of trauma and is nontender on exam, so low suspicion for fracture or traumatic injury.  Patient found to have acute left lower extremity DVT.  1 for associated PE as patient does not have chest pain, shortness of breath, tachypnea, tachycardia, or hypoxia.  Labs notable for platelets 98.  Patient  did have a procedure, which was a left breast lumpectomy, axillary node biopsy, and port placement.  5 days ago. Discussed with surgery team on call who feel anticoagulation is appropriate, since patient is now 5 days out from her recent procedure of left breast lumpectomy, port placement, and axillary node biopsy.  Patient was initiated on anticoagulation for new DVT.  First dose of Eliquis given in the ED and patient was counseled on this medication by pharmacy and given kit.  All questions were answered.  Strict return precautions were given.  Patient felt comfortable discharge at this time.  She will call and update her surgeon and follow-up closely.        Final Clinical Impression(s) / ED Diagnoses Final diagnoses:  Leg swelling  Acute deep vein thrombosis (DVT) of popliteal vein of left lower extremity ([place])    Rx / DC Orders ED Discharge Orders          Ordered    APIXABAN (ELIQUIS) VTE STARTER PACK ($RemoveBefor'10MG'RdskdmvDPqaO$  AND $Re'5MG'Trf$ )        11/19/21 1940              Rosine Abe, MD 11/20/21 Beckey Rutter, MD 11/20/21 2039

## 2021-11-19 NOTE — Progress Notes (Signed)
VASCULAR LAB    Left lower extremity venous duplex has been performed.  See CV proc for preliminary results.  Gave verbal report to [facility], PA-C  Aydee Mcnew, RVT 11/19/2021, 5:52 PM

## 2021-11-19 NOTE — ED Provider Triage Note (Signed)
Emergency Medicine Provider Triage Evaluation Note  Patricia Marshall , a 76 y.o. female  was evaluated in triage.  Pt complains of left lower extremity pain.  Patient had a lumpectomy week ago, started having pain to the posterior calf few days ago.  Denies chest pain or shortness of breath.  Not anticoagulated, no history of previous DVTs.  Called oncologist told her to go to ED for DVT study..  Review of Systems  HPI  Physical Exam  There were no vitals taken for this visit. Gen:   Awake, no distress   Resp:  Normal effort  MSK:   Moves extremities without difficulty  Other:  Tenderness to left posterior calf.  DP and PT 1+.  Medical Decision Making  Medically screening exam initiated at 4:30 PM.  Appropriate orders placed.  [name] was informed that the remainder of the evaluation will be completed by another provider, this initial triage assessment does not replace that evaluation, and the importance of remaining in the ED until their evaluation is complete.     Sherrill Raring, PA-C 11/19/21 1631

## 2021-11-19 NOTE — Progress Notes (Signed)
[place] for apixaban Indication: DVT   Labs: Recent Labs    11/19/21 1645  HGB 13.8  HCT 40.0  PLT 98*  CREATININE 0.58    Assessment: 63 yof with hx recent L breast lumpectomy, port placement, and axillary node biopsy on 11/15/21 presenting with leg swelling, found to have acute LLE DVT. Pharmacy consulted to start apixaban. Per ED provider Truman Hayward) discussion with Surgery provider, ok to start treatment-dose anticoagulation with patient now 5 days out from procedure. Also noted with low platelets 98 on presentation. Hg wnl. No bleed issues reported. Renal function WNL.  Goal of Therapy:  VTE treatment Monitor platelets by anticoagulation protocol: Yes   Plan:  Apixaban '10mg'$  PO BID x 7 days; then '5mg'$  PO BID Monitor CBC, s/sx bleeding   Arturo Morton, PharmD, BCPS Clinical Pharmacist 11/19/2021 7:32 PM

## 2021-11-20 LAB — SURGICAL PATHOLOGY

## 2021-11-21 ENCOUNTER — Telehealth: Payer: Self-pay | Admitting: *Deleted

## 2021-11-21 NOTE — Telephone Encounter (Signed)
-----   Message from Benay Pike, MD sent at 11/21/2021  3:02 PM EDT ----- Sure.   Val, can you talk to her make sure she has enough of the blood thinner until she comes to see me. ----- Message ----- From: Rolm Bookbinder, MD Sent: 11/21/2021   8:29 AM EDT To: Benay Pike, MD  This is patient with new dvt. I am going to call with path today and will let her know you will take care of dvt.  Thanks, [facility]

## 2021-11-21 NOTE — Telephone Encounter (Signed)
This RN contacted pt and discussed new DVT and noted prescription for apixiban- pt verified she has enough of the medication ( has a starter pak ) until her appt here on Monday 9/11.

## 2021-11-26 ENCOUNTER — Inpatient Hospital Stay: Payer: Medicare HMO | Attending: Hematology and Oncology | Admitting: Hematology and Oncology

## 2021-11-26 ENCOUNTER — Other Ambulatory Visit: Payer: Self-pay

## 2021-11-26 DIAGNOSIS — Z170 Estrogen receptor positive status [ER+]: Secondary | ICD-10-CM | POA: Insufficient documentation

## 2021-11-26 DIAGNOSIS — C50212 Malignant neoplasm of upper-inner quadrant of left female breast: Secondary | ICD-10-CM | POA: Diagnosis not present

## 2021-11-26 DIAGNOSIS — Z79899 Other long term (current) drug therapy: Secondary | ICD-10-CM | POA: Insufficient documentation

## 2021-11-26 DIAGNOSIS — Z5111 Encounter for antineoplastic chemotherapy: Secondary | ICD-10-CM | POA: Insufficient documentation

## 2021-11-26 NOTE — Progress Notes (Unsigned)
[facility] NOTE  Patient Care Team: Patricia Peng, NP as PCP - General (Family Medicine) Patricia Bookbinder, MD as Consulting Physician (General Surgery) Patricia Pike, MD as Consulting Physician (Hematology and Oncology) Patricia Rudd, MD as Consulting Physician (Radiation Oncology) Patricia Germany, RN as Oncology Nurse Navigator Patricia Kaufmann, RN as Oncology Nurse Navigator  CHIEF COMPLAINTS/PURPOSE OF CONSULTATION:  Newly diagnosed breast cancer  HISTORY OF PRESENTING ILLNESS:   Patricia Marshall 76 y.o. female is here because of recent diagnosis of left breast cancer.  I reviewed her records extensively and collaborated the history with the patient.  SUMMARY OF ONCOLOGIC HISTORY: Oncology History  Malignant neoplasm of upper-inner quadrant of left breast in female, estrogen receptor positive ([place])  09/20/2021 Mammogram   Screening mammogram showed extremely dense breasts and a possible mass in the left breast warranting further evaluation.  Breast density category is D Diagnostic mammogram of the breast showed indeterminate mass in the 11:00 location of the left breast.  Indeterminate mass in the lower anterior left axilla.   10/04/2021 Pathology Results   Pathology showed grade 2 invasive ductal carcinoma, left axillary soft tissue needle core biopsy showed benign breast tissue with patchy changes prognostics from the breast tumor showed ER 90% positive strong staining PR 0%, negative, Ki-67 of 30% and HER2 positive for IHC 3+   10/19/2021 Initial Diagnosis   Malignant neoplasm of upper-inner quadrant of left breast in female, estrogen receptor positive ([place])   10/23/2021 Cancer Staging   Staging form: Breast, AJCC 8th Edition - Pathologic: Stage IIA (pT2, pN0, cM0, G3, ER+, PR-, HER2+) - Signed by Patricia Pike, MD on 11/26/2021 Histologic grading system: 3 grade system   12/10/2021 -  Chemotherapy   Patient is on Treatment Plan : BREAST Paclitaxel  + Trastuzumab q7d / Trastuzumab q21d      Interval history Patricia Marshall is here for follow-up after surgery with her husband.  She tells me that the surgery went well but postanesthesia she had terrible nausea and did not feel well at all.  She is a very worried about having to do chemotherapy.  She had several questions along with her husband and made a list of all questions which we ran by today. Rest of the pertinent 10 point ROS reviewed and negative.  MEDICAL HISTORY:  Past Medical History:  Diagnosis Date   Arthritis    Breast cancer ([place])    Chicken pox    High cholesterol    PONV (postoperative nausea and vomiting)    Vitamin D deficiency     SURGICAL HISTORY: Past Surgical History:  Procedure Laterality Date   [place]   BREAST LUMPECTOMY WITH AXILLARY LYMPH NODE BIOPSY Left 11/15/2021   Procedure: LEFT BREAST LUMPECTOMY WITH AXILLARY SENTINEL LYMPH NODE BIOPSY;  Surgeon: Patricia Bookbinder, MD;  Location: [facility];  Service: General;  Laterality: Left;   PORTACATH PLACEMENT Right 11/15/2021   Procedure: INSERTION PORT-A-CATH;  Surgeon: Patricia Bookbinder, MD;  Location: [facility];  Service: General;  Laterality: Right;   TONSILLECTOMY AND ADENOIDECTOMY  1953    SOCIAL HISTORY: Social History   Socioeconomic History   Marital status: Married    Spouse name: Not on file   Number of children: Not on file   Years of education: Not on file   Highest education level: Not on file  Occupational History   Not on file  Tobacco Use  Smoking status: Never   Smokeless tobacco: Never  Vaping Use   Vaping Use: Never used  Substance and Sexual Activity   Alcohol use: Yes    Alcohol/week: 1.0 standard drink of alcohol    Types: 1 Glasses of wine per week   Drug use: Never   Sexual activity: Not on file  Other Topics Concern   Not on file  Social History Narrative   Married    Retired    Animal nutritionist of Radio broadcast assistant Strain: [place]  (09/03/2021)   Overall Financial Resource Strain (CARDIA)    Difficulty of Paying Living Expenses: Not hard at all  Food Insecurity: No Food Insecurity (09/03/2021)   Hunger Vital Sign    Worried About Running Out of Food in the Last Year: Never true    [place] in the Last Year: Never true  Transportation Needs: No Transportation Needs (09/03/2021)   PRAPARE - Hydrologist (Medical): No    Lack of Transportation (Non-Medical): No  Physical Activity: Sufficiently Active (09/03/2021)   Exercise Vital Sign    Days of Exercise per Week: 4 days    Minutes of Exercise per Session: 120 min  Stress: No Stress Concern Present (09/03/2021)   [place]    Feeling of Stress : Not at all  Social Connections: [place] (09/03/2021)   Social Connection and Isolation Panel [NHANES]    Frequency of Communication with Friends and Family: More than three times a week    Frequency of Social Gatherings with Friends and Family: More than three times a week    Attends Religious Services: More than 4 times per year    Active Member of [employer] or Organizations: Yes    Attends Music therapist: More than 4 times per year    Marital Status: Married  Human resources officer Violence: Not At Risk (09/03/2021)   Humiliation, Afraid, Rape, and Kick questionnaire    Fear of Current or Ex-Partner: No    Emotionally Abused: No    Physically Abused: No    Sexually Abused: No    FAMILY HISTORY: Family History  Problem Relation Age of Onset   Stomach cancer Mother 28   Heart attack Father 14   Cancer Sister        dx mid 56s; unknown primary w/ mets; treated like ovarian cancer   Heart attack Brother 48   Heart attack Brother 62    ALLERGIES:  is allergic to other and ampicillin.  MEDICATIONS:  Current Outpatient Medications   Medication Sig Dispense Refill   APIXABAN (ELIQUIS) VTE STARTER PACK ($RemoveBefor'10MG'yEFtvntcvrmd$  AND $Re'5MG'YjP$ ) Take as directed on package: start with two-$RemoveBefore'5mg'iGBORgufwyRhG$  tablets twice daily for 7 days. On day 8, switch to one-$RemoveBefor'5mg'LSuSzrMsaNEi$  tablet twice daily. 1 each 0   Cholecalciferol (VITAMIN D) 50 MCG (2000 UT) tablet Take 2,000 Units by mouth daily.     lidocaine-prilocaine (EMLA) cream Apply to affected area once 30 g 3   ondansetron (ZOFRAN) 4 MG tablet Take 1 tablet (4 mg total) by mouth daily as needed for nausea or vomiting. 10 tablet 0   ondansetron (ZOFRAN) 8 MG tablet Take 1 tablet (8 mg total) by mouth every 8 (eight) hours as needed for nausea or vomiting. 30 tablet 1   prochlorperazine (COMPAZINE) 10 MG tablet Take 1 tablet (10 mg total) by mouth every 6 (six) hours as needed for nausea or vomiting.  30 tablet 1   traMADol (ULTRAM) 50 MG tablet Take 1 tablet (50 mg total) by mouth every 6 (six) hours as needed. 10 tablet 0   No current facility-administered medications for this visit.    REVIEW OF SYSTEMS:   Constitutional: Denies fevers, chills or abnormal night sweats Eyes: Denies blurriness of vision, double vision or watery eyes Ears, nose, mouth, throat, and face: Denies mucositis or sore throat Respiratory: Denies cough, dyspnea or wheezes Cardiovascular: Denies palpitation, chest discomfort or lower extremity swelling Gastrointestinal:  Denies nausea, heartburn or change in bowel habits Skin: Denies abnormal skin rashes Lymphatics: Denies new lymphadenopathy or easy bruising Neurological:Denies numbness, tingling or new weaknesses Behavioral/Psych: Mood is stable, no new changes  Breast: Denies any palpable lumps or discharge All other systems were reviewed with the patient and are negative.  PHYSICAL EXAMINATION: ECOG PERFORMANCE STATUS: 0 - Asymptomatic  Vitals:   11/26/21 1349  BP: (!) 146/62  Pulse: 68  Resp: 16  Temp: 98.1 F (36.7 C)  SpO2: 100%   Filed Weights   11/26/21 1349  Weight: 118 lb  (53.5 kg)   Physical exam, breast appears to be healing well along with the axilla. LABORATORY DATA:  I have reviewed the data as listed Lab Results  Component Value Date   WBC 8.7 11/19/2021   HGB 13.8 11/19/2021   HCT 40.0 11/19/2021   MCV 86.6 11/19/2021   PLT 98 (L) 11/19/2021   Lab Results  Component Value Date   NA 140 11/19/2021   K 4.0 11/19/2021   CL 104 11/19/2021   CO2 23 11/19/2021    RADIOGRAPHIC STUDIES: I have personally reviewed the radiological reports and agreed with the findings in the report.  ASSESSMENT AND PLAN:  Malignant neoplasm of upper-inner quadrant of left breast in female, estrogen receptor positive ([place]) This is a very pleasant 76 year old postmenopausal female patient with newly diagnosed left breast upper inner quadrant invasive ductal carcinoma, grade 2, ER +90% strong staining, PR 0% negative, HER2 positive by IHC 3+, Ki-67 of 30% referred to breast [place] for additional recommendations.  Patient arrived to the appointment today with her husband.  She denies any major medical issues at baseline.  She is very healthy.  Physical examination today without any concerns except for the palpable left breast upper inner mass measuring about 2 cm with no definitive regional adenopathy.  Given HER2 amplified invasive ductal carcinoma, before surgery we have discussed about both neoadjuvant and adjuvant approach.  We have settled on adjuvant Taxol with Herceptin given small tumor with strong ER positivity.  She is now here post surgery.  We have once again discussed the pathology results.  We have discussed the role of adjuvant therapy.  We have discussed in detail about Taxol and Herceptin weekly for 12 cycles followed by Herceptin maintenance for total of 1 year.  We have discussed in detail about adverse effects of chemotherapy and Herceptin based therapy including but not limited to fatigue, nausea, vomiting, increased risk of infections, hair loss, neuropathy,  cardiotoxicity.  We discussed the small risk of malignancies which is in the order of 0.5% from chemotherapy.  Patient also had several questions listed.  We have discussed that the basis for this regimen is the APT trial by Dr. Leandra Kern which showed excellent long-term outcomes.  I have given her the references if she would like to read about it.  She understands the role of adjuvant therapy.  Post adjuvant therapy, she will have to proceed with  radiation followed by adjuvant antiestrogen therapy.  All her questions were answered to the best of my knowledge.  I spent about 40 minutes in the care of this patient reviewing all the questions, pathology, need for chemotherapy, adverse effects of chemotherapy.  Baseline echo satisfactory. She is anticipated to see Dr. Donne Hazel September 23, hence will start chemotherapy likely September 25 or so.   All questions were answered. The patient knows to call the clinic with any problems, questions or concerns.    Patricia Pike, MD 11/28/21

## 2021-11-26 NOTE — Progress Notes (Signed)
START ON PATHWAY REGIMEN - Breast     Cycle 1: A cycle is 7 days:     Trastuzumab-xxxx      Paclitaxel    Cycles 2 through 12: A cycle is every 7 days:     Trastuzumab-xxxx      Paclitaxel    Cycles 13 through 25: A cycle is every 21 days:     Trastuzumab-xxxx   **Always confirm dose/schedule in your pharmacy ordering system**  Patient Characteristics: Postoperative without Neoadjuvant Therapy (Pathologic Staging), Invasive Disease, Adjuvant Therapy, HER2 Positive, ER Positive, Node Negative, pT2, pN0, Tumor Size ?  3 cm Therapeutic Status: Postoperative without Neoadjuvant Therapy (Pathologic Staging) AJCC Grade: G3 AJCC N Category: pN0 AJCC M Category: cM0 ER Status: Positive (+) AJCC 8 Stage Grouping: IIA HER2 Status: Positive (+) Oncotype Dx Recurrence Score: Not Appropriate AJCC T Category: pT2 PR Status: Negative (-) Adjuvant Therapy Status: No Adjuvant Therapy Received Yet or Changing Initial Adjuvant Regimen due to Tolerance Intent of Therapy: Curative Intent, Discussed with Patient

## 2021-11-27 ENCOUNTER — Other Ambulatory Visit: Payer: Self-pay

## 2021-11-27 ENCOUNTER — Encounter: Payer: Self-pay | Admitting: *Deleted

## 2021-11-27 ENCOUNTER — Other Ambulatory Visit: Payer: Self-pay | Admitting: *Deleted

## 2021-11-27 DIAGNOSIS — C50212 Malignant neoplasm of upper-inner quadrant of left female breast: Secondary | ICD-10-CM

## 2021-11-27 MED ORDER — PROCHLORPERAZINE MALEATE 10 MG PO TABS: 10.0000 mg | ORAL_TABLET | Freq: Four times a day (QID) | ORAL | 1 refills | Status: DC | PRN

## 2021-11-27 MED ORDER — LIDOCAINE-PRILOCAINE 2.5-2.5 % EX CREA: TOPICAL_CREAM | CUTANEOUS | 3 refills | Status: DC

## 2021-11-27 MED ORDER — ONDANSETRON HCL 8 MG PO TABS: 8.0000 mg | ORAL_TABLET | Freq: Three times a day (TID) | ORAL | 1 refills | Status: DC | PRN

## 2021-11-28 ENCOUNTER — Other Ambulatory Visit: Payer: Self-pay

## 2021-11-28 ENCOUNTER — Encounter: Payer: Self-pay | Admitting: Hematology and Oncology

## 2021-11-28 ENCOUNTER — Other Ambulatory Visit: Payer: Self-pay | Admitting: Hematology and Oncology

## 2021-11-28 ENCOUNTER — Inpatient Hospital Stay: Payer: Medicare HMO

## 2021-11-28 DIAGNOSIS — C50212 Malignant neoplasm of upper-inner quadrant of left female breast: Secondary | ICD-10-CM

## 2021-11-28 NOTE — Assessment & Plan Note (Signed)
This is a very pleasant 76 year old postmenopausal female patient with newly diagnosed left breast upper inner quadrant invasive ductal carcinoma, grade 2, ER +90% strong staining, PR 0% negative, HER2 positive by IHC 3+, Ki-67 of 30% referred to breast [place] for additional recommendations.  Patient arrived to the appointment today with her husband.  She denies any major medical issues at baseline.  She is very healthy.  Physical examination today without any concerns except for the palpable left breast upper inner mass measuring about 2 cm with no definitive regional adenopathy.  Given HER2 amplified invasive ductal carcinoma, before surgery we have discussed about both neoadjuvant and adjuvant approach.  We have settled on adjuvant Taxol with Herceptin given small tumor with strong ER positivity.  She is now here post surgery.  We have once again discussed the pathology results.  We have discussed the role of adjuvant therapy.  We have discussed in detail about Taxol and Herceptin weekly for 12 cycles followed by Herceptin maintenance for total of 1 year.  We have discussed in detail about adverse effects of chemotherapy and Herceptin based therapy including but not limited to fatigue, nausea, vomiting, increased risk of infections, hair loss, neuropathy, cardiotoxicity.  We discussed the small risk of malignancies which is in the order of 0.5% from chemotherapy.  Patient also had several questions listed.  We have discussed that the basis for this regimen is the APT trial by Dr. Leandra Kern which showed excellent long-term outcomes.  I have given her the references if she would like to read about it.  She understands the role of adjuvant therapy.  Post adjuvant therapy, she will have to proceed with radiation followed by adjuvant antiestrogen therapy.  All her questions were answered to the best of my knowledge.  I spent about 40 minutes in the care of this patient reviewing all the questions, pathology, need for  chemotherapy, adverse effects of chemotherapy.  Baseline echo satisfactory. She is anticipated to see Dr. Donne Hazel September 23, hence will start chemotherapy likely September 25 or so.

## 2021-11-29 ENCOUNTER — Ambulatory Visit: Payer: Medicare HMO

## 2021-11-29 ENCOUNTER — Telehealth: Payer: Self-pay | Admitting: Adult Health

## 2021-11-29 NOTE — Telephone Encounter (Signed)
Pt has a call into her oncologist

## 2021-11-29 NOTE — Telephone Encounter (Signed)
Pt states she needs clearance from her oncologist to get her flu shot which is tomorrow. Oncologist is Benay Pike  [facility] [phone]

## 2021-11-29 NOTE — Telephone Encounter (Signed)
Advised pt to reach out to oncologist. Pt verbalized understanding.

## 2021-11-30 ENCOUNTER — Ambulatory Visit (INDEPENDENT_AMBULATORY_CARE_PROVIDER_SITE_OTHER): Payer: Medicare HMO

## 2021-11-30 DIAGNOSIS — Z23 Encounter for immunization: Secondary | ICD-10-CM | POA: Diagnosis not present

## 2021-12-03 ENCOUNTER — Ambulatory Visit: Payer: Medicare HMO | Attending: General Surgery | Admitting: Physical Therapy

## 2021-12-03 ENCOUNTER — Encounter: Payer: Self-pay | Admitting: Physical Therapy

## 2021-12-03 ENCOUNTER — Encounter: Payer: Self-pay | Admitting: *Deleted

## 2021-12-03 DIAGNOSIS — Z483 Aftercare following surgery for neoplasm: Secondary | ICD-10-CM | POA: Diagnosis not present

## 2021-12-03 DIAGNOSIS — R293 Abnormal posture: Secondary | ICD-10-CM | POA: Diagnosis not present

## 2021-12-03 DIAGNOSIS — Z170 Estrogen receptor positive status [ER+]: Secondary | ICD-10-CM | POA: Insufficient documentation

## 2021-12-03 DIAGNOSIS — C50212 Malignant neoplasm of upper-inner quadrant of left female breast: Secondary | ICD-10-CM | POA: Diagnosis not present

## 2021-12-03 NOTE — Patient Instructions (Addendum)
Closed Chain: Shoulder Abduction / Adduction - on Wall    One hand on wall, step to side and return. Stepping causes shoulder to abduct and adduct. Step __5_ times, holding 5-10 seconds, _2__ times per day.  http://ss.exer.us/267   Copyright  VHI. All rights reserved.  Closed Chain: Shoulder Flexion / Extension - on Wall    Hands on wall, step backward. Return. Stepping causes shoulder flexion and extension Step __5_ times, holding 5-10 seconds, _2__ times per day.  http://ss.exer.us/265   Copyright  VHI. All rights reserved.   After Breast Cancer Class It is recommended you attend the ABC class to be educated on lymphedema risk reduction. This class is free of charge and lasts for 1 hour. It is a 1-time class. You will need to download the Webex app either on your phone or computer. We will send you a link the night before or the morning of the class. You should be able to click on that link to join the class. This is not a confidential class. You don't have to turn your camera on, but other participants may be able to see your email address. You were given the information in writing since doing a virtual class would be difficult for you.  Scar massage You can begin gentle scar massage to you incision sites. Gently place one hand on the incision and move the skin (without sliding on the skin) in various directions. Do this for a few minutes and then you can gently massage either coconut oil or vitamin E cream into the scars.  Compression garment You should continue wearing your compression bra until you feel like you no longer have swelling.  Home exercise Program Continue doing the exercises you were given until you feel like you can do them without feeling any tightness at the end.   Walking Program Studies show that 30 minutes of walking per day (fast enough to elevate your heart rate) can significantly reduce the risk of a cancer recurrence. If you can't walk due to other  medical reasons, we encourage you to find another activity you could do (like a stationary bike or water exercise).  Posture After breast cancer surgery, people frequently sit with rounded shoulders posture because it puts their incisions on slack and feels better. If you sit like this and scar tissue forms in that position, you can become very tight and have pain sitting or standing with good posture. Try to be aware of your posture and sit and stand up tall to heal properly.  Follow up PT: It is recommended you return every 3 months for the first 3 years following surgery to be assessed on the SOZO machine for an L-Dex score. This helps prevent clinically significant lymphedema in 95% of patients. These follow up screens are 10 minute appointments that you are not billed for.

## 2021-12-03 NOTE — Progress Notes (Signed)
Pharmacist Chemotherapy Monitoring - Initial Assessment    Anticipated start date: 12/10/21   The following has been reviewed per standard work regarding the patient's treatment regimen: The patient's diagnosis, treatment plan and drug doses, and organ/hematologic function Lab orders and baseline tests specific to treatment regimen  The treatment plan start date, drug sequencing, and pre-medications Prior authorization status  Patient's documented medication list, including drug-drug interaction screen and prescriptions for anti-emetics and supportive care specific to the treatment regimen The drug concentrations, fluid compatibility, administration routes, and timing of the medications to be used The patient's access for treatment and lifetime cumulative dose history, if applicable  The patient's medication allergies and previous infusion related reactions, if applicable   Changes made to treatment plan:  N/A  Follow up needed:  Pending authorization for treatment    Avish Torry D, [place], 12/03/2021  11:22 AM

## 2021-12-03 NOTE — Therapy (Signed)
OUTPATIENT PHYSICAL THERAPY BREAST CANCER POST OP FOLLOW UP   Patient Name: Patricia Marshall MRN: [id number] DOB:09/20/1945, 75 y.o., female Today's Date: 12/03/2021   PT End of Session - 12/03/21 1356     Visit Number 2    Number of Visits 3    Date for PT Re-Evaluation 12/31/21    PT Start Time 1357    PT Stop Time 1458    PT Time Calculation (min) 61 min    Activity Tolerance Patient tolerated treatment well    Behavior During Therapy WFL for tasks assessed/performed             Past Medical History:  Diagnosis Date   Arthritis    Breast cancer (HCC)    Chicken pox    High cholesterol    PONV (postoperative nausea and vomiting)    Vitamin D deficiency    Past Surgical History:  Procedure Laterality Date   APPENDECTOMY  1959   BACK SURGERY     1996   BREAST LUMPECTOMY WITH AXILLARY LYMPH NODE BIOPSY Left 11/15/2021   Procedure: LEFT BREAST LUMPECTOMY WITH AXILLARY SENTINEL LYMPH NODE BIOPSY;  Surgeon: Wakefield, Matthew, MD;  Location: [facility];  Service: General;  Laterality: Left;   PORTACATH PLACEMENT Right 11/15/2021   Procedure: INSERTION PORT-A-CATH;  Surgeon: Wakefield, Matthew, MD;  Location: [facility];  Service: General;  Laterality: Right;   TONSILLECTOMY AND ADENOIDECTOMY  1954   Patient Active Problem List   Diagnosis Date Noted   Breast cancer (HCC) 11/15/2021   Malignant neoplasm of upper-inner quadrant of left breast in female, estrogen receptor positive (HCC) 10/19/2021   Piriformis syndrome of left side 07/12/2015   Degenerative arthritis of left knee 07/12/2015   BEE STING REACTION, LOCAL 10/24/2006    REFERRING PROVIDER: Dr. Matthew Wakefield  REFERRING DIAG: Left breast cancer  THERAPY DIAG:  Malignant neoplasm of upper-inner quadrant of left breast in female, estrogen receptor positive (HCC)  Abnormal posture  Aftercare following surgery for neoplasm  Rationale for Evaluation and Treatment  Rehabilitation  ONSET DATE: 11/15/2021  SUBJECTIVE:                                                                                                                                                                                           SUBJECTIVE STATEMENT: Patient reports she underwent a left lumpectomy and sentinel node biopsy (2 negative nodes) on 11/15/2021. She will begin chemotherapy next week with Herceptin and Taxol as she is HER2 positive. She will then undergo radiation and anti-estrogen therapy.  PERTINENT HISTORY:  Patient was diagnosed on 09/20/2021 with left grade 2   invasive ductal carcinoma breast cancer. She underwent a left lumpectomy and sentinel node biopsy (2 negative nodes) on 11/15/2021. It is ER positive, PR negative, and HER2 positive with a Ki67 of 30%.   PATIENT GOALS:  Reassess how my recovery is going related to arm function, pain, and swelling.  PAIN:  Are you having pain? Yes: NPRS scale: 1/10 Pain location: left posterior upper arm Pain description: soreness Aggravating factors: nothing Relieving factors: nothing  PRECAUTIONS: Recent Surgery, left UE Lymphedema risk  ACTIVITY LEVEL / LEISURE: She has not returned to any exercises including pickleball   OBJECTIVE:   PATIENT SURVEYS:  QUICK DASH:  Quick Dash - 12/03/21 0001     Open a tight or new jar No difficulty    Do heavy household chores (wash walls, wash floors) No difficulty    Carry a shopping bag or briefcase No difficulty    Wash your back No difficulty    Use a knife to cut food No difficulty    Recreational activities in which you take some force or impact through your arm, shoulder, or hand (golf, hammering, tennis) Unable    During the past week, to what extent has your arm, shoulder or hand problem interfered with your normal social activities with family, friends, neighbors, or groups? Not at all    During the past week, to what extent has your arm, shoulder or hand problem limited  your work or other regular daily activities [place]    Arm, shoulder, or hand pain. None    Tingling (pins and needles) in your arm, shoulder, or hand None    Difficulty Sleeping No difficulty    DASH Score 13.64 %              OBSERVATIONS:  Left breast and axillary incisions both appear to be healing well with steri-strips in place. Mild bruising at nipple present. No significant edema present. Left axilla appeared to have some cording but at further look, it appears to be tissue tightness from axillary incision pulling on pectoralis muscle. Pt was instructed with scar massage.  POSTURE:  Forward head and rounded shoulders posture  LYMPHEDEMA ASSESSMENT:   UPPER EXTREMITY AROM/PROM:   A/PROM RIGHT   eval    Shoulder extension 43  Shoulder flexion 146  Shoulder abduction 160  Shoulder internal rotation 70  Shoulder external rotation 78                          (Blank rows = not tested)   A/PROM LEFT   eval LEFT 12/03/2021  Shoulder extension 49 57  Shoulder flexion 130 123  Shoulder abduction 148 129  Shoulder internal rotation 68 67  Shoulder external rotation 70 79                          (Blank rows = not tested)     CERVICAL AROM: All within normal limits   UPPER EXTREMITY STRENGTH: WNL     LYMPHEDEMA ASSESSMENTS:    LANDMARK RIGHT   eval RIGHT 12/03/2021  10 cm proximal to olecranon process 24 23.2  Olecranon process 22.4 21.8  10 cm proximal to ulnar styloid process 20.8 19.4  Just proximal to ulnar styloid process 14.3 14.3  Across hand at thumb web space 17.4 17.6  At base of 2nd digit 5.8 5.9  (Blank rows = not tested)   LANDMARK LEFT   eval LEFT 12/03/2021    10 cm proximal to olecranon process 22.8 21.9  Olecranon process 21.5 21.4  10 cm proximal to ulnar styloid process 19.3 18.9  Just proximal to ulnar styloid process 14.2 14.1  Across hand at thumb web space 17.4 17.8  At base of 2nd digit 5.9 5.8  (Blank rows = not tested)        Surgery type/Date: 11/15/2021 left lumpectomy and SLNB Number of lymph nodes removed: 2 Current/past treatment (chemo, radiation, hormone therapy): none Other symptoms:  Heaviness/tightness No Pain Yes Pitting edema No Infections No Decreased scar mobility No Stemmer sign No   PATIENT EDUCATION:  Education details: HEP Person educated: Patient Education method: Customer service manager Education comprehension: verbalized understanding and returned demonstration   HOME EXERCISE PROGRAM:  Reviewed previously given post op HEP.  ASSESSMENT:  CLINICAL IMPRESSION: Patient is doing very well s/p left lumpectomy and sentinel node biopsy on 11/15/2021. She has nearly regained full shoulder ROM and her incisions are healing well. There is no significant edema present and no axillary cording. She has some scar tissue tightness which can not yet be addressed because steri-strips are still present. She was educated on how to perform self scar mobilization once those come off and was given several new HEP exercises to achieve end range flexion and abduction. She would like to come back in 1 month to have those issues reassessed.  Pt will benefit from skilled therapeutic intervention to improve on the following deficits: Decreased knowledge of precautions, impaired UE functional use, pain, decreased ROM, postural dysfunction.   PT treatment/interventions: ADL/Self care home management, Therapeutic exercises, Therapeutic activity, Patient/Family education, Self Care, Manual therapy, and Re-evaluation     GOALS: Goals reviewed with patient? Yes  LONG TERM GOALS:  (STG=LTG)  GOALS Name Target Date  Goal status  1 Pt will demonstrate she has regained full shoulder ROM and function post operatively compared to baselines.   12/19/2021 IN PROGRESS  2 Patient will demonstrate her tissue tightness in her axilla has resolved from doing self scar massage. 12/31/2021 INITIAL      PLAN: PT FREQUENCY/DURATION: Will f/u in 1 month to ensure pt is back to baseline.  PLAN FOR NEXT SESSION: Reassess to ensure she is back to baseline   [facility]  [address], [address]  [place] [address]  [phone]  After Breast Cancer Class It is recommended you attend the ABC class to be educated on lymphedema risk reduction. This class is free of charge and lasts for 1 hour. It is a 1-time class. You will need to download the Webex app either on your phone or computer. We will send you a link the night before or the morning of the class. You should be able to click on that link to join the class. This is not a confidential class. You don't have to turn your camera on, but other participants may be able to see your email address. You were given the information in writing since doing a virtual class would be difficult for you.  Scar massage You can begin gentle scar massage to you incision sites. Gently place one hand on the incision and move the skin (without sliding on the skin) in various directions. Do this for a few minutes and then you can gently massage either coconut oil or vitamin E cream into the scars.  Compression garment You should continue wearing your compression bra until you feel like you no longer have swelling.  Home exercise Program Continue  doing the exercises you were given until you feel like you can do them without feeling any tightness at the end.   Walking Program Studies show that 30 minutes of walking per day (fast enough to elevate your heart rate) can significantly reduce the risk of a cancer recurrence. If you can't walk due to other medical reasons, we encourage you to find another activity you could do (like a stationary bike or water exercise).  Posture After breast cancer surgery, people frequently sit with rounded shoulders posture because it puts their incisions on slack and feels better. If you sit like this and  scar tissue forms in that position, you can become very tight and have pain sitting or standing with good posture. Try to be aware of your posture and sit and stand up tall to heal properly.  Follow up PT: It is recommended you return every 3 months for the first 3 years following surgery to be assessed on the SOZO machine for an L-Dex score. This helps prevent clinically significant lymphedema in 95% of patients. These follow up screens are 10 minute appointments that you are not billed for. You're scheduled for December 11th at 3:10pm.  Annia Friendly, PT 12/03/21 3:18 PM

## 2021-12-04 ENCOUNTER — Ambulatory Visit: Payer: Medicare HMO | Admitting: Hematology and Oncology

## 2021-12-07 ENCOUNTER — Encounter: Payer: Self-pay | Admitting: Hematology and Oncology

## 2021-12-07 ENCOUNTER — Encounter: Payer: Self-pay | Admitting: Genetic Counselor

## 2021-12-07 DIAGNOSIS — Z1379 Encounter for other screening for genetic and chromosomal anomalies: Secondary | ICD-10-CM | POA: Insufficient documentation

## 2021-12-07 MED FILL — Dexamethasone Sodium Phosphate Inj 100 MG/10ML: INTRAMUSCULAR | Qty: 1 | Status: AC

## 2021-12-07 NOTE — Progress Notes (Signed)
HPI:   Patricia Marshall was previously seen in the [facility] clinic due to a personal and family history of cancer and concerns regarding a hereditary predisposition to cancer. Please refer to our prior cancer genetics clinic note for more information regarding our discussion, assessment and recommendations, at the time. Patricia Marshall recent genetic test results were disclosed to her, as were recommendations warranted by these results. These results and recommendations are discussed in more detail below.  CANCER HISTORY: ' In July 2023, at the age of 76, Patricia Marshall was diagnosed with invasive ductal carcinoma of the left breast (ER+/HER2-/PR+).  Oncology History  Malignant neoplasm of upper-inner quadrant of left breast in female, estrogen receptor positive ([place])  09/20/2021 Mammogram   Screening mammogram showed extremely dense breasts and a possible mass in the left breast warranting further evaluation.  Breast density category is D Diagnostic mammogram of the breast showed indeterminate mass in the 11:00 location of the left breast.  Indeterminate mass in the lower anterior left axilla.   10/04/2021 Pathology Results   Pathology showed grade 2 invasive ductal carcinoma, left axillary soft tissue needle core biopsy showed benign breast tissue with patchy changes prognostics from the breast tumor showed ER 90% positive strong staining PR 0%, negative, Ki-67 of 30% and HER2 positive for IHC 3+   10/19/2021 Initial Diagnosis   Malignant neoplasm of upper-inner quadrant of left breast in female, estrogen receptor positive ([place])   10/23/2021 Cancer Staging   Staging form: Breast, AJCC 8th Edition - Pathologic: Stage IIA (pT2, pN0, cM0, G3, ER+, PR-, HER2+) - Signed by Benay Pike, MD on 11/26/2021 Histologic grading system: 3 grade system   11/10/2021 Genetic Testing   Negative hereditary cancer genetic testing: no pathogenic variants detected in [facility] CancerNext-Expanded +RNA Panel.   Report date is November 10, 2021.   The CancerNext-Expanded gene panel offered by [facility] and includes sequencing, rearrangement, and RNA analysis for the following 77 genes: AIP, ALK, APC, ATM, AXIN2, BAP1, BARD1, BLM, BMPR1A, BRCA1, BRCA2, BRIP1, CDC73, CDH1, CDK4, CDKN1B, CDKN2A, CHEK2, CTNNA1, DICER1, FANCC, FH, FLCN, GALNT12, KIF1B, LZTR1, MAX, MEN1, MET, MLH1, MSH2, MSH3, MSH6, MUTYH, NBN, NF1, NF2, NTHL1, PALB2, PHOX2B, PMS2, POT1, PRKAR1A, PTCH1, PTEN, RAD51C, RAD51D, RB1, RECQL, RET, SDHA, SDHAF2, SDHB, SDHC, SDHD, SMAD4, SMARCA4, SMARCB1, SMARCE1, STK11, SUFU, TMEM127, TP53, TSC1, TSC2, VHL and XRCC2 (sequencing and deletion/duplication); EGFR, EGLN1, HOXB13, KIT, MITF, PDGFRA, POLD1, and POLE (sequencing only); EPCAM and GREM1 (deletion/duplication only).    12/10/2021 -  Chemotherapy   Patient is on Treatment Plan : BREAST Paclitaxel + Trastuzumab q7d / Trastuzumab q21d       FAMILY HISTORY:  We obtained a detailed, 4-generation family history.  Significant diagnoses are listed below:      Family History  Problem Relation Age of Onset   Stomach cancer Mother 28   Cancer Sister          dx mid 80s; unknown primary w/ mets; treated like ovarian cancer     Patricia Marshall stated that her niece my have had genetic testing previously but is unaware of the results. Patricia Marshall has limited information about maternal family ancestry.  She believes some maternal ancestors are from [place], suggesting possibly Ramos ancestry.  There is no known consanguinity.  GENETIC TEST RESULTS:  The [facility] CancerNext-Expanded +RNAinsight Panel found no pathogenic mutations.   The CancerNext-Expanded gene panel offered by [facility] and includes sequencing, rearrangement, and RNA analysis for the following 77 genes: AIP,  ALK, APC, ATM, AXIN2, BAP1, BARD1, BLM, BMPR1A, BRCA1, BRCA2, BRIP1, CDC73, CDH1, CDK4, CDKN1B, CDKN2A, CHEK2, CTNNA1, DICER1, FANCC, FH, FLCN, GALNT12, KIF1B, LZTR1, MAX,  MEN1, MET, MLH1, MSH2, MSH3, MSH6, MUTYH, NBN, NF1, NF2, NTHL1, PALB2, PHOX2B, PMS2, POT1, PRKAR1A, PTCH1, PTEN, RAD51C, RAD51D, RB1, RECQL, RET, SDHA, SDHAF2, SDHB, SDHC, SDHD, SMAD4, SMARCA4, SMARCB1, SMARCE1, STK11, SUFU, TMEM127, TP53, TSC1, TSC2, VHL and XRCC2 (sequencing and deletion/duplication); EGFR, EGLN1, HOXB13, KIT, MITF, PDGFRA, POLD1, and POLE (sequencing only); EPCAM and GREM1 (deletion/duplication only).  .   The test report has been scanned into EPIC and is located under the Molecular Pathology section of the Results Review tab.  A portion of the result report is included below for reference. Genetic testing reported out on November 10, 2021.       Even though a pathogenic variant was not identified, possible explanations for the cancer in the family may include: There may be no hereditary risk for cancer in the family. The cancers in Patricia Marshall and/or her family may be sporadic/familial or due to other genetic and environmental factors. There may be a gene mutation in one of these genes that current testing methods cannot detect but that chance is small. There could be another gene that has not yet been discovered, or that we have not yet tested, that is responsible for the cancer diagnoses in the family.  It is also possible there is a hereditary cause for the cancer in the family that Patricia Marshall did not inherit.   Therefore, it is important to remain in touch with cancer genetics in the future so that we can continue to offer Patricia Marshall the most up to date genetic testing.    ADDITIONAL GENETIC TESTING:  We discussed with Patricia Marshall that her genetic testing was fairly extensive.  If there are additional relevant genes identified to increase cancer risk that can be analyzed in the future, we would be happy to discuss and coordinate this testing at that time.    CANCER SCREENING RECOMMENDATIONS:  Patricia Marshall's test result is considered negative (normal).  This means that  we have not identified a hereditary cause for her personal history of breast cancer at this time.   An individual's cancer risk and medical management are not determined by genetic test results alone. Overall cancer risk assessment incorporates additional factors, including personal medical history, family history, and any available genetic information that may result in a personalized plan for cancer prevention and surveillance. Therefore, it is recommended she continue to follow the cancer management and screening guidelines provided by her oncology and primary healthcare provider.   RECOMMENDATIONS FOR FAMILY MEMBERS:   Individuals in this family might be at some increased risk of developing cancer, over the general population risk, due to the family history of cancer.  Individuals in the family should notify their providers of the family history of cancer. We recommend women in this family have a yearly mammogram beginning at age 81, or 65 years younger than the earliest onset of cancer, an annual clinical breast exam, and perform monthly breast self-exams.    FOLLOW-UP:  Lastly, we discussed with Patricia Marshall that cancer genetics is a rapidly advancing field and it is possible that new genetic tests will be appropriate for her and/or her family members in the future. We encouraged her to remain in contact with cancer genetics on an annual basis so we can update her personal and family histories and let her know of advances in cancer genetics  that may benefit this family.   Our contact number was provided. Patricia Marshall's questions were answered to her satisfaction, and she knows she is welcome to call us at anytime with additional questions or concerns.   Halden Phegley M. Joette Catching, [place], [facility] Genetic Counselor [email or web address] (P) [phone]

## 2021-12-10 ENCOUNTER — Inpatient Hospital Stay: Payer: Medicare HMO

## 2021-12-10 ENCOUNTER — Encounter: Payer: Self-pay | Admitting: *Deleted

## 2021-12-10 ENCOUNTER — Encounter: Payer: Self-pay | Admitting: Hematology and Oncology

## 2021-12-10 ENCOUNTER — Other Ambulatory Visit: Payer: Self-pay

## 2021-12-10 ENCOUNTER — Inpatient Hospital Stay: Payer: Medicare HMO | Admitting: Hematology and Oncology

## 2021-12-10 VITALS — BP 144/64 | HR 70 | Temp 98.5°F | Resp 16

## 2021-12-10 DIAGNOSIS — Z5111 Encounter for antineoplastic chemotherapy: Secondary | ICD-10-CM | POA: Diagnosis not present

## 2021-12-10 DIAGNOSIS — Z79899 Other long term (current) drug therapy: Secondary | ICD-10-CM | POA: Diagnosis not present

## 2021-12-10 DIAGNOSIS — Z170 Estrogen receptor positive status [ER+]: Secondary | ICD-10-CM | POA: Diagnosis not present

## 2021-12-10 DIAGNOSIS — C50212 Malignant neoplasm of upper-inner quadrant of left female breast: Secondary | ICD-10-CM

## 2021-12-10 LAB — CMP (CANCER CENTER ONLY)
ALT: 8 U/L (ref 0–44)
AST: 14 U/L — ABNORMAL LOW (ref 15–41)
Albumin: 4.3 g/dL (ref 3.5–5.0)
Alkaline Phosphatase: 76 U/L (ref 38–126)
Anion gap: 4 — ABNORMAL LOW (ref 5–15)
BUN: 17 mg/dL (ref 8–23)
CO2: 28 mmol/L (ref 22–32)
Calcium: 9.5 mg/dL (ref 8.9–10.3)
Chloride: 102 mmol/L (ref 98–111)
Creatinine: 0.62 mg/dL (ref 0.44–1.00)
GFR, Estimated: >60 mL/min (ref >60)
Glucose, Bld: 114 mg/dL — ABNORMAL HIGH (ref 70–99)
Potassium: 4.3 mmol/L (ref 3.5–5.1)
Sodium: 134 mmol/L — ABNORMAL LOW (ref 135–145)
Total Bilirubin: 0.7 mg/dL (ref 0.3–1.2)
Total Protein: 7.1 g/dL (ref 6.5–8.1)

## 2021-12-10 LAB — CBC WITH DIFFERENTIAL (CANCER CENTER ONLY)
Abs Immature Granulocytes: 0.02 10*3/uL (ref 0.00–0.07)
Basophils Absolute: 0.1 10*3/uL (ref 0.0–0.1)
Basophils Relative: 1 %
Eosinophils Absolute: 0 10*3/uL (ref 0.0–0.5)
Eosinophils Relative: 0 %
HCT: 37.2 % (ref 36.0–46.0)
Hemoglobin: 12.9 g/dL (ref 12.0–15.0)
Immature Granulocytes: 0 %
Lymphocytes Relative: 26 %
Lymphs Abs: 1.5 10*3/uL (ref 0.7–4.0)
MCH: 29.9 pg (ref 26.0–34.0)
MCHC: 34.7 g/dL (ref 30.0–36.0)
MCV: 86.1 fL (ref 80.0–100.0)
Monocytes Absolute: 0.5 10*3/uL (ref 0.1–1.0)
Monocytes Relative: 9 %
Neutro Abs: 3.6 10*3/uL (ref 1.7–7.7)
Neutrophils Relative %: 64 %
Platelet Count: 167 10*3/uL (ref 150–400)
RBC: 4.32 MIL/uL (ref 3.87–5.11)
RDW: 11.9 % (ref 11.5–15.5)
WBC Count: 5.6 10*3/uL (ref 4.0–10.5)
nRBC: 0 % (ref 0.0–0.2)

## 2021-12-10 MED ORDER — SODIUM CHLORIDE 0.9 % IV SOLN: Freq: Once | INTRAVENOUS | Status: AC

## 2021-12-10 MED ORDER — CETIRIZINE HCL 10 MG/ML IV SOLN
10.0000 mg | Freq: Once | INTRAVENOUS | Status: AC
  Administered 2021-12-10: 10 mg via INTRAVENOUS
  Filled 2021-12-10: qty 1

## 2021-12-10 MED ORDER — SODIUM CHLORIDE 0.9% FLUSH
10.0000 mL | INTRAVENOUS | Status: DC | PRN
  Administered 2021-12-10: 10 mL

## 2021-12-10 MED ORDER — ACETAMINOPHEN 325 MG PO TABS
650.0000 mg | ORAL_TABLET | Freq: Once | ORAL | Status: AC
  Administered 2021-12-10: 650 mg via ORAL
  Filled 2021-12-10: qty 2

## 2021-12-10 MED ORDER — TRASTUZUMAB-DKST CHEMO 150 MG IV SOLR
4.0000 mg/kg | Freq: Once | INTRAVENOUS | Status: AC
  Administered 2021-12-10: 210 mg via INTRAVENOUS
  Filled 2021-12-10: qty 10

## 2021-12-10 MED ORDER — SODIUM CHLORIDE 0.9 % IV SOLN
10.0000 mg | Freq: Once | INTRAVENOUS | Status: AC
  Administered 2021-12-10: 10 mg via INTRAVENOUS
  Filled 2021-12-10: qty 10

## 2021-12-10 MED ORDER — SODIUM CHLORIDE 0.9 % IV SOLN
80.0000 mg/m2 | Freq: Once | INTRAVENOUS | Status: AC
  Administered 2021-12-10: 126 mg via INTRAVENOUS
  Filled 2021-12-10: qty 21

## 2021-12-10 MED ORDER — HEPARIN SOD (PORK) LOCK FLUSH 100 UNIT/ML IV SOLN
500.0000 [IU] | Freq: Once | INTRAVENOUS | Status: AC | PRN
  Administered 2021-12-10: 500 [IU]

## 2021-12-10 MED ORDER — FAMOTIDINE IN NACL 20-0.9 MG/50ML-% IV SOLN
20.0000 mg | Freq: Once | INTRAVENOUS | Status: AC
  Administered 2021-12-10: 20 mg via INTRAVENOUS
  Filled 2021-12-10: qty 50

## 2021-12-10 MED ORDER — SODIUM CHLORIDE 0.9% FLUSH
10.0000 mL | INTRAVENOUS | Status: DC | PRN
  Administered 2021-12-10: 10 mL via INTRAVENOUS

## 2021-12-10 NOTE — Assessment & Plan Note (Signed)
This is a very pleasant 76 year old postmenopausal female patient with newly diagnosed left breast upper inner quadrant invasive ductal carcinoma, grade 2, ER +90% strong staining, PR 0% negative, HER2 positive by IHC 3+, Ki-67 of 30% referred to breast [place] for additional recommendations.  She had left breast lumpectomy which showed a T2 N0 tumor hence we have discussed about adjuvant Taxol and Herceptin.  She is here for her first cycle of chemotherapy.  No concerns on physical exam today.  Okay to proceed with the chemotherapy as scheduled.  Return to clinic every other appointment for provider visit whereas weekly for chemotherapy.  All their questions were once again answered to the best my knowledge.

## 2021-12-10 NOTE — Progress Notes (Signed)
[facility] NOTE  Patient Care Team: Dorothyann Peng, NP as PCP - General (Family Medicine) Rolm Bookbinder, MD as Consulting Physician (General Surgery) Benay Pike, MD as Consulting Physician (Hematology and Oncology) Kyung Rudd, MD as Consulting Physician (Radiation Oncology) Rockwell Germany, RN as Oncology Nurse Navigator Mauro Kaufmann, RN as Oncology Nurse Navigator  CHIEF COMPLAINTS/PURPOSE OF CONSULTATION:  Newly diagnosed breast cancer  HISTORY OF PRESENTING ILLNESS:   Patricia Marshall 76 y.o. female is here because of recent diagnosis of left breast cancer.  I reviewed her records extensively and collaborated the history with the patient.  SUMMARY OF ONCOLOGIC HISTORY: Oncology History  Malignant neoplasm of upper-inner quadrant of left breast in female, estrogen receptor positive ([place])  09/20/2021 Mammogram   Screening mammogram showed extremely dense breasts and a possible mass in the left breast warranting further evaluation.  Breast density category is D Diagnostic mammogram of the breast showed indeterminate mass in the 11:00 location of the left breast.  Indeterminate mass in the lower anterior left axilla.   10/04/2021 Pathology Results   Pathology showed grade 2 invasive ductal carcinoma, left axillary soft tissue needle core biopsy showed benign breast tissue with patchy changes prognostics from the breast tumor showed ER 90% positive strong staining PR 0%, negative, Ki-67 of 30% and HER2 positive for IHC 3+   10/19/2021 Initial Diagnosis   Malignant neoplasm of upper-inner quadrant of left breast in female, estrogen receptor positive ([name])   10/23/2021 Cancer Staging   Staging form: Breast, AJCC 8th Edition - Pathologic: Stage IIA (pT2, pN0, cM0, G3, ER+, PR-, HER2+) - Signed by Benay Pike, MD on 11/26/2021 Histologic grading system: 3 grade system   11/10/2021 Genetic Testing   Negative hereditary cancer genetic testing: no  pathogenic variants detected in [facility] CancerNext-Expanded +RNA Panel.  Report date is November 10, 2021.   The CancerNext-Expanded gene panel offered by [facility] and includes sequencing, rearrangement, and RNA analysis for the following 77 genes: AIP, ALK, APC, ATM, AXIN2, BAP1, BARD1, BLM, BMPR1A, BRCA1, BRCA2, BRIP1, CDC73, CDH1, CDK4, CDKN1B, CDKN2A, CHEK2, CTNNA1, DICER1, FANCC, FH, FLCN, GALNT12, KIF1B, LZTR1, MAX, MEN1, MET, MLH1, MSH2, MSH3, MSH6, MUTYH, NBN, NF1, NF2, NTHL1, PALB2, PHOX2B, PMS2, POT1, PRKAR1A, PTCH1, PTEN, RAD51C, RAD51D, RB1, RECQL, RET, SDHA, SDHAF2, SDHB, SDHC, SDHD, SMAD4, SMARCA4, SMARCB1, SMARCE1, STK11, SUFU, TMEM127, TP53, TSC1, TSC2, VHL and XRCC2 (sequencing and deletion/duplication); EGFR, EGLN1, HOXB13, KIT, MITF, PDGFRA, POLD1, and POLE (sequencing only); EPCAM and GREM1 (deletion/duplication only).    12/10/2021 -  Chemotherapy   Patient is on Treatment Plan : BREAST Paclitaxel + Trastuzumab q7d / Trastuzumab q21d      Interval history  Ms. Bogie is here for follow-up before first cycle of chemotherapy. She is understandably very nervous about having to go through chemotherapy but otherwise denies any complaints.  Rest of the pertinent review of systems reviewed and negative   MEDICAL HISTORY:  Past Medical History:  Diagnosis Date   Arthritis    Breast cancer ([place])    Chicken pox    High cholesterol    PONV (postoperative nausea and vomiting)    Vitamin D deficiency     SURGICAL HISTORY: Past Surgical History:  Procedure Laterality Date   [place] LUMPECTOMY WITH AXILLARY LYMPH NODE BIOPSY Left 11/15/2021   Procedure: LEFT BREAST LUMPECTOMY WITH AXILLARY SENTINEL LYMPH NODE BIOPSY;  Surgeon: Rolm Bookbinder, MD;  Location: [facility];  Service: General;  Laterality: Left;   PORTACATH PLACEMENT Right 11/15/2021   Procedure: INSERTION PORT-A-CATH;  Surgeon: Rolm Bookbinder, MD;   Location: [facility];  Service: General;  Laterality: Right;   TONSILLECTOMY AND ADENOIDECTOMY  1954    SOCIAL HISTORY: Social History   Socioeconomic History   Marital status: Married    Spouse name: Not on file   Number of children: Not on file   Years of education: Not on file   Highest education level: Not on file  Occupational History   Not on file  Tobacco Use   Smoking status: Never   Smokeless tobacco: Never  Vaping Use   Vaping Use: Never used  Substance and Sexual Activity   Alcohol use: Yes    Alcohol/week: 1.0 standard drink of alcohol    Types: 1 Glasses of wine per week   Drug use: Never   Sexual activity: Not on file  Other Topics Concern   Not on file  Social History Narrative   Married    Retired    Investment banker, operational of Radio broadcast assistant Strain: [place]  (09/03/2021)   Overall Financial Resource Strain (CARDIA)    Difficulty of Paying Living Expenses: Not hard at all  Food Insecurity: No Food Insecurity (09/03/2021)   Hunger Vital Sign    Worried About Running Out of Food in the Last Year: Never true    [place] in the Last Year: Never true  Transportation Needs: No Transportation Needs (09/03/2021)   PRAPARE - Hydrologist (Medical): No    Lack of Transportation (Non-Medical): No  Physical Activity: Sufficiently Active (09/03/2021)   Exercise Vital Sign    Days of Exercise per Week: 4 days    Minutes of Exercise per Session: 120 min  Stress: No Stress Concern Present (09/03/2021)   [place]    Feeling of Stress : Not at all  Social Connections: [place] (09/03/2021)   Social Connection and Isolation Panel [NHANES]    Frequency of Communication with Friends and Family: More than three times a week    Frequency of Social Gatherings with Friends and Family: More than three times a week    Attends Religious  Services: More than 4 times per year    Active Member of [employer] or Organizations: Yes    Attends Music therapist: More than 4 times per year    Marital Status: Married  Human resources officer Violence: Not At Risk (09/03/2021)   Humiliation, Afraid, Rape, and Kick questionnaire    Fear of Current or Ex-Partner: No    Emotionally Abused: No    Physically Abused: No    Sexually Abused: No    FAMILY HISTORY: Family History  Problem Relation Age of Onset   Stomach cancer Mother 38   Heart attack Father 4   Cancer Sister        dx mid 69s; unknown primary w/ mets; treated like ovarian cancer   Heart attack Brother 75   Heart attack Brother 16    ALLERGIES:  is allergic to other and ampicillin.  MEDICATIONS:  Current Outpatient Medications  Medication Sig Dispense Refill   APIXABAN (ELIQUIS) VTE STARTER PACK ($RemoveBefor'10MG'fKlIErsFFUuw$  AND $Re'5MG'pPm$ ) Take as directed on package: start with two-$RemoveBefore'5mg'gYAkFEfdUbGIw$  tablets twice daily for 7 days. On day 8, switch to one-$RemoveBefor'5mg'BaIwZzMWiziJ$  tablet twice daily. 1 each 0   Cholecalciferol (VITAMIN  D) 50 MCG (2000 UT) tablet Take 2,000 Units by mouth daily.     lidocaine-prilocaine (EMLA) cream Apply to affected area once 30 g 3   ondansetron (ZOFRAN) 4 MG tablet Take 1 tablet (4 mg total) by mouth daily as needed for nausea or vomiting. 10 tablet 0   ondansetron (ZOFRAN) 8 MG tablet Take 1 tablet (8 mg total) by mouth every 8 (eight) hours as needed for nausea or vomiting. 30 tablet 1   prochlorperazine (COMPAZINE) 10 MG tablet Take 1 tablet (10 mg total) by mouth every 6 (six) hours as needed for nausea or vomiting. 30 tablet 1   traMADol (ULTRAM) 50 MG tablet Take 1 tablet (50 mg total) by mouth every 6 (six) hours as needed. 10 tablet 0   No current facility-administered medications for this visit.   Facility-Administered Medications Ordered in Other Visits  Medication Dose Route Frequency Provider Last Rate Last Admin   acetaminophen (TYLENOL) tablet 650 mg  650 mg Oral Once Jabori Henegar,  Arletha Pili, MD       dexamethasone (DECADRON) 10 mg in sodium chloride 0.9 % 50 mL IVPB  10 mg Intravenous Once Benay Pike, MD 204 mL/hr at 12/10/21 1257 10 mg at 12/10/21 1257   famotidine (PEPCID) IVPB 20 mg premix  20 mg Intravenous Once Christerpher Clos, Arletha Pili, MD       heparin lock flush 100 unit/mL  500 Units Intracatheter Once PRN Rudene Poulsen, Arletha Pili, MD       PACLitaxel (TAXOL) 126 mg in sodium chloride 0.9 % 250 mL chemo infusion (</= $RemoveBefor'80mg'VqqyNZinOxFK$ /m2)  80 mg/m2 (Treatment Plan Recorded) Intravenous Once Shadoe Bethel, MD       sodium chloride flush (NS) 0.9 % injection 10 mL  10 mL Intracatheter PRN Janelis Stelzer, MD       trastuzumab-dkst (OGIVRI) 210 mg in sodium chloride 0.9 % 250 mL chemo infusion  4 mg/kg (Treatment Plan Recorded) Intravenous Once Gadiel John, Arletha Pili, MD        REVIEW OF SYSTEMS:   Constitutional: Denies fevers, chills or abnormal night sweats Eyes: Denies blurriness of vision, double vision or watery eyes Ears, nose, mouth, throat, and face: Denies mucositis or sore throat Respiratory: Denies cough, dyspnea or wheezes Cardiovascular: Denies palpitation, chest discomfort or lower extremity swelling Gastrointestinal:  Denies nausea, heartburn or change in bowel habits Skin: Denies abnormal skin rashes Lymphatics: Denies new lymphadenopathy or easy bruising Neurological:Denies numbness, tingling or new weaknesses Behavioral/Psych: Mood is stable, no new changes  Breast: Denies any palpable lumps or discharge All other systems were reviewed with the patient and are negative.  PHYSICAL EXAMINATION: ECOG PERFORMANCE STATUS: 0 - Asymptomatic  Vitals:   12/10/21 1211  BP: (!) 150/75  Pulse: 66  Resp: 18  Temp: (!) 97.4 F (36.3 C)  SpO2: 100%    Filed Weights   12/10/21 1211  Weight: 115 lb 8 oz (52.4 kg)   Physical Exam Constitutional:      Appearance: Normal appearance. She is normal weight.  Cardiovascular:     Rate and Rhythm: Normal rate and regular rhythm.      Pulses: Normal pulses.     Heart sounds: Normal heart sounds.  Pulmonary:     Effort: Pulmonary effort is normal.     Breath sounds: Normal breath sounds.  Musculoskeletal:        General: No swelling or tenderness.     Cervical back: Normal range of motion and neck supple. No rigidity.  Lymphadenopathy:     Cervical: No cervical  adenopathy.  Skin:    General: Skin is warm and dry.  Neurological:     General: No focal deficit present.     Mental Status: She is alert.  Psychiatric:        Mood and Affect: Mood normal.    LABORATORY DATA:  I have reviewed the data as listed Lab Results  Component Value Date   WBC 5.6 12/10/2021   HGB 12.9 12/10/2021   HCT 37.2 12/10/2021   MCV 86.1 12/10/2021   PLT 167 12/10/2021   Lab Results  Component Value Date   NA 134 (L) 12/10/2021   K 4.3 12/10/2021   CL 102 12/10/2021   CO2 28 12/10/2021    RADIOGRAPHIC STUDIES: I have personally reviewed the radiological reports and agreed with the findings in the report.  ASSESSMENT AND PLAN:  No problem-specific Assessment & Plan notes found for this encounter.   All questions were answered. The patient knows to call the clinic with any problems, questions or concerns.    Benay Pike, MD 12/10/21

## 2021-12-10 NOTE — Progress Notes (Signed)
[facility] NOTE  Patient Care Team: Dorothyann Peng, NP as PCP - General (Family Medicine) Rolm Bookbinder, MD as Consulting Physician (General Surgery) Benay Pike, MD as Consulting Physician (Hematology and Oncology) Kyung Rudd, MD as Consulting Physician (Radiation Oncology) Rockwell Germany, RN as Oncology Nurse Navigator Mauro Kaufmann, RN as Oncology Nurse Navigator  CHIEF COMPLAINTS/PURPOSE OF CONSULTATION:  Newly diagnosed breast cancer  HISTORY OF PRESENTING ILLNESS:   Patricia Marshall 76 y.o. female is here because of recent diagnosis of left breast cancer.  I reviewed her records extensively and collaborated the history with the patient.  SUMMARY OF ONCOLOGIC HISTORY: Oncology History  Malignant neoplasm of upper-inner quadrant of left breast in female, estrogen receptor positive ([place])  09/20/2021 Mammogram   Screening mammogram showed extremely dense breasts and a possible mass in the left breast warranting further evaluation.  Breast density category is D Diagnostic mammogram of the breast showed indeterminate mass in the 11:00 location of the left breast.  Indeterminate mass in the lower anterior left axilla.   10/04/2021 Pathology Results   Pathology showed grade 2 invasive ductal carcinoma, left axillary soft tissue needle core biopsy showed benign breast tissue with patchy changes prognostics from the breast tumor showed ER 90% positive strong staining PR 0%, negative, Ki-67 of 30% and HER2 positive for IHC 3+   10/19/2021 Initial Diagnosis   Malignant neoplasm of upper-inner quadrant of left breast in female, estrogen receptor positive ([place])   10/23/2021 Cancer Staging   Staging form: Breast, AJCC 8th Edition - Pathologic: Stage IIA (pT2, pN0, cM0, G3, ER+, PR-, HER2+) - Signed by Benay Pike, MD on 11/26/2021 Histologic grading system: 3 grade system   11/10/2021 Genetic Testing   Negative hereditary cancer genetic testing: no  pathogenic variants detected in [facility] CancerNext-Expanded +RNA Panel.  Report date is November 10, 2021.   The CancerNext-Expanded gene panel offered by [facility] and includes sequencing, rearrangement, and RNA analysis for the following 77 genes: AIP, ALK, APC, ATM, AXIN2, BAP1, BARD1, BLM, BMPR1A, BRCA1, BRCA2, BRIP1, CDC73, CDH1, CDK4, CDKN1B, CDKN2A, CHEK2, CTNNA1, DICER1, FANCC, FH, FLCN, GALNT12, KIF1B, LZTR1, MAX, MEN1, MET, MLH1, MSH2, MSH3, MSH6, MUTYH, NBN, NF1, NF2, NTHL1, PALB2, PHOX2B, PMS2, POT1, PRKAR1A, PTCH1, PTEN, RAD51C, RAD51D, RB1, RECQL, RET, SDHA, SDHAF2, SDHB, SDHC, SDHD, SMAD4, SMARCA4, SMARCB1, SMARCE1, STK11, SUFU, TMEM127, TP53, TSC1, TSC2, VHL and XRCC2 (sequencing and deletion/duplication); EGFR, EGLN1, HOXB13, KIT, MITF, PDGFRA, POLD1, and POLE (sequencing only); EPCAM and GREM1 (deletion/duplication only).    12/10/2021 -  Chemotherapy   Patient is on Treatment Plan : BREAST Paclitaxel + Trastuzumab q7d / Trastuzumab q21d      Interval history Ms. Luton is here for follow-up after surgery with her husband.  She tells me that the surgery went well but postanesthesia she had terrible nausea and did not feel well at all.  She is a very worried about having to do chemotherapy.  She had several questions along with her husband and made a list of all questions which we ran by today. Rest of the pertinent 10 point ROS reviewed and negative.  MEDICAL HISTORY:  Past Medical History:  Diagnosis Date   Arthritis    Breast cancer ([place])    Chicken pox    High cholesterol    PONV (postoperative nausea and vomiting)    Vitamin D deficiency     SURGICAL HISTORY: Past Surgical History:  Procedure Laterality Date   APPENDECTOMY  1959   BACK SURGERY  1996   BREAST LUMPECTOMY WITH AXILLARY LYMPH NODE BIOPSY Left 11/15/2021   Procedure: LEFT BREAST LUMPECTOMY WITH AXILLARY SENTINEL LYMPH NODE BIOPSY;  Surgeon: Rolm Bookbinder, MD;  Location: [facility];  Service: General;  Laterality: Left;   PORTACATH PLACEMENT Right 11/15/2021   Procedure: INSERTION PORT-A-CATH;  Surgeon: Rolm Bookbinder, MD;  Location: [facility];  Service: General;  Laterality: Right;   TONSILLECTOMY AND ADENOIDECTOMY  1954    SOCIAL HISTORY: Social History   Socioeconomic History   Marital status: Married    Spouse name: Not on file   Number of children: Not on file   Years of education: Not on file   Highest education level: Not on file  Occupational History   Not on file  Tobacco Use   Smoking status: Never   Smokeless tobacco: Never  Vaping Use   Vaping Use: Never used  Substance and Sexual Activity   Alcohol use: Yes    Alcohol/week: 1.0 standard drink of alcohol    Types: 1 Glasses of wine per week   Drug use: Never   Sexual activity: Not on file  Other Topics Concern   Not on file  Social History Narrative   Married    Retired    Investment banker, operational of Radio broadcast assistant Strain: [place]  (09/03/2021)   Overall Financial Resource Strain (CARDIA)    Difficulty of Paying Living Expenses: Not hard at all  Food Insecurity: No Food Insecurity (09/03/2021)   Hunger Vital Sign    Worried About Running Out of Food in the Last Year: Never true    [place] in the Last Year: Never true  Transportation Needs: No Transportation Needs (09/03/2021)   PRAPARE - Hydrologist (Medical): No    Lack of Transportation (Non-Medical): No  Physical Activity: Sufficiently Active (09/03/2021)   Exercise Vital Sign    Days of Exercise per Week: 4 days    Minutes of Exercise per Session: 120 min  Stress: No Stress Concern Present (09/03/2021)   [place]    Feeling of Stress : Not at all  Social Connections: [place] (09/03/2021)   Social Connection and Isolation Panel [NHANES]    Frequency of Communication with Friends  and Family: More than three times a week    Frequency of Social Gatherings with Friends and Family: More than three times a week    Attends Religious Services: More than 4 times per year    Active Member of [employer] or Organizations: Yes    Attends Music therapist: More than 4 times per year    Marital Status: Married  Human resources officer Violence: Not At Risk (09/03/2021)   Humiliation, Afraid, Rape, and Kick questionnaire    Fear of Current or Ex-Partner: No    Emotionally Abused: No    Physically Abused: No    Sexually Abused: No    FAMILY HISTORY: Family History  Problem Relation Age of Onset   Stomach cancer Mother 88   Heart attack Father 43   Cancer Sister        dx mid 70s; unknown primary w/ mets; treated like ovarian cancer   Heart attack Brother 89   Heart attack Brother 61    ALLERGIES:  is allergic to other and ampicillin.  MEDICATIONS:  Current Outpatient Medications  Medication Sig Dispense Refill   APIXABAN (ELIQUIS) VTE STARTER PACK (10MG  AND 5MG) Take as directed on package: start with two-80m tablets twice daily for 7 days. On day 8, switch to one-585mtablet twice daily. 1 each 0   Cholecalciferol (VITAMIN D) 50 MCG (2000 UT) tablet Take 2,000 Units by mouth daily.     lidocaine-prilocaine (EMLA) cream Apply to affected area once 30 g 3   ondansetron (ZOFRAN) 4 MG tablet Take 1 tablet (4 mg total) by mouth daily as needed for nausea or vomiting. 10 tablet 0   ondansetron (ZOFRAN) 8 MG tablet Take 1 tablet (8 mg total) by mouth every 8 (eight) hours as needed for nausea or vomiting. 30 tablet 1   prochlorperazine (COMPAZINE) 10 MG tablet Take 1 tablet (10 mg total) by mouth every 6 (six) hours as needed for nausea or vomiting. 30 tablet 1   traMADol (ULTRAM) 50 MG tablet Take 1 tablet (50 mg total) by mouth every 6 (six) hours as needed. 10 tablet 0   No current facility-administered medications for this visit.   Facility-Administered Medications  Ordered in Other Visits  Medication Dose Route Frequency Provider Last Rate Last Admin   dexamethasone (DECADRON) 10 mg in sodium chloride 0.9 % 50 mL IVPB  10 mg Intravenous Once IrBenay PikeMD 204 mL/hr at 12/10/21 1257 10 mg at 12/10/21 1257   famotidine (PEPCID) IVPB 20 mg premix  20 mg Intravenous Once Iruku, PrArletha PiliMD       heparin lock flush 100 unit/mL  500 Units Intracatheter Once PRN Iruku, PrArletha PiliMD       PACLitaxel (TAXOL) 126 mg in sodium chloride 0.9 % 250 mL chemo infusion (</= 8018m2)  80 mg/m2 (Treatment Plan Recorded) Intravenous Once Iruku, Praveena, MD       sodium chloride flush (NS) 0.9 % injection 10 mL  10 mL Intracatheter PRN Iruku, Praveena, MD       trastuzumab-dkst (OGIVRI) 210 mg in sodium chloride 0.9 % 250 mL chemo infusion  4 mg/kg (Treatment Plan Recorded) Intravenous Once Iruku, PraArletha PiliD        REVIEW OF SYSTEMS:   Constitutional: Denies fevers, chills or abnormal night sweats Eyes: Denies blurriness of vision, double vision or watery eyes Ears, nose, mouth, throat, and face: Denies mucositis or sore throat Respiratory: Denies cough, dyspnea or wheezes Cardiovascular: Denies palpitation, chest discomfort or lower extremity swelling Gastrointestinal:  Denies nausea, heartburn or change in bowel habits Skin: Denies abnormal skin rashes Lymphatics: Denies new lymphadenopathy or easy bruising Neurological:Denies numbness, tingling or new weaknesses Behavioral/Psych: Mood is stable, no new changes  Breast: Denies any palpable lumps or discharge All other systems were reviewed with the patient and are negative.  PHYSICAL EXAMINATION: ECOG PERFORMANCE STATUS: 0 - Asymptomatic  Vitals:   12/10/21 1211  BP: (!) 150/75  Pulse: 66  Resp: 18  Temp: (!) 97.4 F (36.3 C)  SpO2: 100%    Filed Weights   12/10/21 1211  Weight: 115 lb 8 oz (52.4 kg)    Physical exam, breast appears to be healing well along with the axilla. LABORATORY DATA:   I have reviewed the data as listed Lab Results  Component Value Date   WBC 5.6 12/10/2021   HGB 12.9 12/10/2021   HCT 37.2 12/10/2021   MCV 86.1 12/10/2021   PLT 167 12/10/2021   Lab Results  Component Value Date   NA 134 (L) 12/10/2021   K 4.3 12/10/2021   CL 102 12/10/2021   CO2 28 12/10/2021    RADIOGRAPHIC STUDIES: I have  personally reviewed the radiological reports and agreed with the findings in the report.  ASSESSMENT AND PLAN:   Malignant neoplasm of upper-inner quadrant of left breast in female, estrogen receptor positive ([place]) This is a very pleasant 76 year old postmenopausal female patient with newly diagnosed left breast upper inner quadrant invasive ductal carcinoma, grade 2, ER +90% strong staining, PR 0% negative, HER2 positive by IHC 3+, Ki-67 of 30% referred to breast [place] for additional recommendations.  She had left breast lumpectomy which showed a T2 N0 tumor hence we have discussed about adjuvant Taxol and Herceptin.  She is here for her first cycle of chemotherapy.  No concerns on physical exam today.  Okay to proceed with the chemotherapy as scheduled.  Return to clinic every other appointment for provider visit whereas weekly for chemotherapy.  All their questions were once again answered to the best my knowledge.  Total time spent: 20 minutes  All questions were answered. The patient knows to call the clinic with any problems, questions or concerns.    Benay Pike, MD 12/10/21

## 2021-12-10 NOTE — Patient Instructions (Signed)
[facility]  Discharge Instructions: Thank you for choosing [facility] to provide your oncology and hematology care.   If you have a lab appointment with the [facility], please go directly to the [facility] and check in at the registration area.   Wear comfortable clothing and clothing appropriate for easy access to any Portacath or PICC line.   We strive to give you quality time with your provider. You may need to reschedule your appointment if you arrive late (15 or more minutes).  Arriving late affects you and other patients whose appointments are after yours.  Also, if you miss three or more appointments without notifying the office, you may be dismissed from the clinic at the provider's discretion.      For prescription refill requests, have your pharmacy contact our office and allow 72 hours for refills to be completed.    Today you received the following chemotherapy and/or immunotherapy agents Trastuzumab, Taxol       To help prevent nausea and vomiting after your treatment, we encourage you to take your nausea medication as directed.  BELOW ARE SYMPTOMS THAT SHOULD BE REPORTED IMMEDIATELY: *FEVER GREATER THAN 100.4 F (38 C) OR HIGHER *CHILLS OR SWEATING *NAUSEA AND VOMITING THAT IS NOT CONTROLLED WITH YOUR NAUSEA MEDICATION *UNUSUAL SHORTNESS OF BREATH *UNUSUAL BRUISING OR BLEEDING *URINARY PROBLEMS (pain or burning when urinating, or frequent urination) *BOWEL PROBLEMS (unusual diarrhea, constipation, pain near the anus) TENDERNESS IN MOUTH AND THROAT WITH OR WITHOUT PRESENCE OF ULCERS (sore throat, sores in mouth, or a toothache) UNUSUAL RASH, SWELLING OR PAIN  UNUSUAL VAGINAL DISCHARGE OR ITCHING   Items with * indicate a potential emergency and should be followed up as soon as possible or go to the Emergency Department if any problems should occur.  Please show the CHEMOTHERAPY ALERT CARD or IMMUNOTHERAPY ALERT CARD at  check-in to the Emergency Department and triage nurse.  Should you have questions after your visit or need to cancel or reschedule your appointment, please contact [facility]  Dept: [phone]  and follow the prompts.  Office hours are 8:00 a.m. to 4:30 p.m. Monday - Friday. Please note that voicemails left after 4:00 p.m. may not be returned until the following business day.  We are closed weekends and major holidays. You have access to a nurse at all times for urgent questions. Please call the main number to the clinic Dept: [phone] and follow the prompts.   For any non-urgent questions, you may also contact your provider using MyChart. We now offer e-Visits for anyone 63 and older to request care online for non-urgent symptoms. For details visit [email or web address].   Also download the MyChart app! Go to the app store, search "MyChart", open the app, select [facility], and log in with your MyChart username and password.  Masks are optional in the cancer centers. If you would like for your care team to wear a mask while they are taking care of you, please let them know. You may have one support person who is at least 75 years old accompany you for your appointments.  Trastuzumab Injection What is this medication? TRASTUZUMAB (tras TOO zoo mab) treats breast cancer and stomach cancer. It works by blocking a protein that causes cancer cells to grow and multiply. This helps to slow or stop the spread of cancer cells. This medicine may be used for other purposes; ask your health care provider or pharmacist if you  have questions. COMMON BRAND NAME(S): Herceptin, Janae Bridgeman, Ontruzant, Trazimera What should I tell my care team before I take this medication? They need to know if you have any of these conditions: Heart failure Lung disease An unusual or allergic reaction to trastuzumab, other medications, foods, dyes, or preservatives Pregnant  or trying to get pregnant Breast-feeding How should I use this medication? This medication is injected into a vein. It is given by your care team in a hospital or clinic setting. Talk to your care team about the use of this medication in children. It is not approved for use in children. Overdosage: If you think you have taken too much of this medicine contact a poison control center or emergency room at once. NOTE: This medicine is only for you. Do not share this medicine with others. What if I miss a dose? Keep appointments for follow-up doses. It is important not to miss your dose. Call your care team if you are unable to keep an appointment. What may interact with this medication? Certain types of chemotherapy, such as daunorubicin, doxorubicin, epirubicin, idarubicin This list may not describe all possible interactions. Give your health care provider a list of all the medicines, herbs, non-prescription drugs, or dietary supplements you use. Also tell them if you smoke, drink alcohol, or use illegal drugs. Some items may interact with your medicine. What should I watch for while using this medication? Your condition will be monitored carefully while you are receiving this medication. This medication may make you feel generally unwell. This is not uncommon, as chemotherapy affects healthy cells as well as cancer cells. Report any side effects. Continue your course of treatment even though you feel ill unless your care team tells you to stop. This medication may increase your risk of getting an infection. Call your care team for advice if you get a fever, chills, sore throat, or other symptoms of a cold or flu. Do not treat yourself. Try to avoid being around people who are sick. Avoid taking medications that contain aspirin, acetaminophen, ibuprofen, naproxen, or ketoprofen unless instructed by your care team. These medications can hide a fever. Talk to your care team if you may be pregnant.  Serious birth defects can occur if you take this medication during pregnancy and for 7 months after the last dose. You will need a negative pregnancy test before starting this medication. Contraception is recommended while taking this medication and for 7 months after the last dose. Your care team can help you find the option that works for you. Do not breastfeed while taking this medication and for 7 months after stopping treatment. What side effects may I notice from receiving this medication? Side effects that you should report to your care team as soon as possible: Allergic reactions or angioedema--skin rash, itching or hives, swelling of the face, eyes, lips, tongue, arms, or legs, trouble swallowing or breathing Dry cough, shortness of breath or trouble breathing Heart failure--shortness of breath, swelling of the ankles, feet, or hands, sudden weight gain, unusual weakness or fatigue Infection--fever, chills, cough, or sore throat Infusion reactions--chest pain, shortness of breath or trouble breathing, feeling faint or lightheaded Side effects that usually do not require medical attention (report to your care team if they continue or are bothersome): Diarrhea Dizziness Headache Nausea Trouble sleeping Vomiting This list may not describe all possible side effects. Call your doctor for medical advice about side effects. You may report side effects to FDA at 1-800-FDA-1088. Where should I  keep my medication? This medication is given in a hospital or clinic. It will not be stored at home. NOTE: This sheet is a summary. It may not cover all possible information. If you have questions about this medicine, talk to your doctor, pharmacist, or health care provider.  2023 Elsevier/Gold Standard (2021-07-17 00:00:00) Paclitaxel Injection What is this medication? PACLITAXEL (PAK li TAX el) treats some types of cancer. It works by slowing down the growth of cancer cells. This medicine may be used  for other purposes; ask your health care provider or pharmacist if you have questions. COMMON BRAND NAME(S): Onxol, Taxol What should I tell my care team before I take this medication? They need to know if you have any of these conditions: Heart disease Liver disease Low white blood cell levels An unusual or allergic reaction to paclitaxel, other medications, foods, dyes, or preservatives If you or your partner are pregnant or trying to get pregnant Breast-feeding How should I use this medication? This medication is injected into a vein. It is given by your care team in a hospital or clinic setting. Talk to your care team about the use of this medication in children. While it may be given to children for selected conditions, precautions do apply. Overdosage: If you think you have taken too much of this medicine contact a poison control center or emergency room at once. NOTE: This medicine is only for you. Do not share this medicine with others. What if I miss a dose? Keep appointments for follow-up doses. It is important not to miss your dose. Call your care team if you are unable to keep an appointment. What may interact with this medication? Do not take this medication with any of the following: Live virus vaccines Other medications may affect the way this medication works. Talk with your care team about all of the medications you take. They may suggest changes to your treatment plan to lower the risk of side effects and to make sure your medications work as intended. This list may not describe all possible interactions. Give your health care provider a list of all the medicines, herbs, non-prescription drugs, or dietary supplements you use. Also tell them if you smoke, drink alcohol, or use illegal drugs. Some items may interact with your medicine. What should I watch for while using this medication? Your condition will be monitored carefully while you are receiving this medication. You may  need blood work while taking this medication. This medication may make you feel generally unwell. This is not uncommon as chemotherapy can affect healthy cells as well as cancer cells. Report any side effects. Continue your course of treatment even though you feel ill unless your care team tells you to stop. This medication can cause serious allergic reactions. To reduce the risk, your care team may give you other medications to take before receiving this one. Be sure to follow the directions from your care team. This medication may increase your risk of getting an infection. Call your care team for advice if you get a fever, chills, sore throat, or other symptoms of a cold or flu. Do not treat yourself. Try to avoid being around people who are sick. This medication may increase your risk to bruise or bleed. Call your care team if you notice any unusual bleeding. Be careful brushing or flossing your teeth or using a toothpick because you may get an infection or bleed more easily. If you have any dental work done, tell your dentist you  are receiving this medication. Talk to your care team if you may be pregnant. Serious birth defects can occur if you take this medication during pregnancy. Talk to your care team before breastfeeding. Changes to your treatment plan may be needed. What side effects may I notice from receiving this medication? Side effects that you should report to your care team as soon as possible: Allergic reactions--skin rash, itching, hives, swelling of the face, lips, tongue, or throat Heart rhythm changes--fast or irregular heartbeat, dizziness, feeling faint or lightheaded, chest pain, trouble breathing Increase in blood pressure Infection--fever, chills, cough, sore throat, wounds that don't heal, pain or trouble when passing urine, general feeling of discomfort or being unwell Low blood pressure--dizziness, feeling faint or lightheaded, blurry vision Low red blood cell  level--unusual weakness or fatigue, dizziness, headache, trouble breathing Painful swelling, warmth, or redness of the skin, blisters or sores at the infusion site Pain, tingling, or numbness in the hands or feet Slow heartbeat--dizziness, feeling faint or lightheaded, confusion, trouble breathing, unusual weakness or fatigue Unusual bruising or bleeding Side effects that usually do not require medical attention (report to your care team if they continue or are bothersome): Diarrhea Hair loss Joint pain Loss of appetite Muscle pain Nausea Vomiting This list may not describe all possible side effects. Call your doctor for medical advice about side effects. You may report side effects to FDA at 1-800-FDA-1088. Where should I keep my medication? This medication is given in a hospital or clinic. It will not be stored at home. NOTE: This sheet is a summary. It may not cover all possible information. If you have questions about this medicine, talk to your doctor, pharmacist, or health care provider.  2023 Elsevier/Gold Standard (2021-07-19 00:00:00)

## 2021-12-12 ENCOUNTER — Encounter: Payer: Self-pay | Admitting: Hematology and Oncology

## 2021-12-12 ENCOUNTER — Telehealth: Payer: Self-pay | Admitting: Genetic Counselor

## 2021-12-12 NOTE — Progress Notes (Signed)
Received call from patient's spouse regarding several financial concerns.  He had questions regarding how to know how to prepare financially for what he may be expected to pay and was told by insurance, they will not know until they have been billed. Advised him this would all depend on the ded/OOP amounts based on insurance plan and if plan is set to pay at 100% after amounts are met. Explained how this information is shown on the Explanation of Benefits(EOB) sent by insurance plan. Once the bill has been received, to compare the EOB and bill which should be the same or extremely close. He verbalized understanding.  He had questions regarding itemized statements and if he has to call every time there is a service performed to receive. Advised billing cycles are every 30 days for services during that time period on the back of the bill, there is a brief breakdown of dates of service and services provided. He looked on the back of the bill and was able to confirm this.  He had a question regarding a specific date of service and genetic testing. Advised I would send message to Cari in genetics to follow up on this concern. Staff message sent.  Advised if there are any available copay assistance programs available for chemotherapy, someone will reach out to them to ask if they are interested in applying and advise what is needed to do so. He was appreciative and verbalized understanding.  He thanked me for all of my assistance and answers to the best of my ability. He has my card for any additional financial questions or concerns.

## 2021-12-12 NOTE — Telephone Encounter (Signed)
Per Patricia Marshall, patient's husband, Patricia Marshall, was asking about cost of genetic counseling session during [facility]  Patient said they thought they were told that they would not have an OOP related to genetics if they declined testing.  He stated that he remembers his wife declining genetic testing but giving permission to run sample on 'research' basis-- would he says he was told would be no charge.  Discussed that we would not run this genetics sample on 'research' basis--purpose of this genetic testing is clinical.  Documentation is consistent with Patricia Marshall providing informed consent for testing.  Patricia Marshall was called with results of testing and did not mention declining this test.  Told  Patricia Marshall that we would discuss charge with billing and reach out with outcome.

## 2021-12-12 NOTE — Progress Notes (Signed)
In addition to my previous note, I did discuss the one-time $1000 [employer] account executive and qualifications to assist with personal expenses while going through treatment which could possibly assist with freeing up money for expenses to help with medical bills. Provided income guidelines and he states they would be very close but that was ok. They were provided my card on 12/11/21.

## 2021-12-14 MED FILL — Dexamethasone Sodium Phosphate Inj 100 MG/10ML: INTRAMUSCULAR | Qty: 1 | Status: AC

## 2021-12-17 ENCOUNTER — Inpatient Hospital Stay: Payer: Medicare HMO

## 2021-12-17 ENCOUNTER — Inpatient Hospital Stay: Payer: Medicare HMO | Attending: Hematology and Oncology

## 2021-12-17 ENCOUNTER — Encounter: Payer: Self-pay | Admitting: Hematology and Oncology

## 2021-12-17 ENCOUNTER — Other Ambulatory Visit: Payer: Self-pay

## 2021-12-17 ENCOUNTER — Inpatient Hospital Stay: Payer: Medicare HMO | Admitting: Hematology and Oncology

## 2021-12-17 DIAGNOSIS — C50212 Malignant neoplasm of upper-inner quadrant of left female breast: Secondary | ICD-10-CM

## 2021-12-17 DIAGNOSIS — Z170 Estrogen receptor positive status [ER+]: Secondary | ICD-10-CM

## 2021-12-17 DIAGNOSIS — Z5112 Encounter for antineoplastic immunotherapy: Secondary | ICD-10-CM | POA: Diagnosis not present

## 2021-12-17 DIAGNOSIS — Z5111 Encounter for antineoplastic chemotherapy: Secondary | ICD-10-CM | POA: Diagnosis present

## 2021-12-17 LAB — CBC WITH DIFFERENTIAL (CANCER CENTER ONLY)
Abs Immature Granulocytes: 0.01 10*3/uL (ref 0.00–0.07)
Basophils Absolute: 0 10*3/uL (ref 0.0–0.1)
Basophils Relative: 1 %
Eosinophils Absolute: 0 10*3/uL (ref 0.0–0.5)
Eosinophils Relative: 1 %
HCT: 35 % — ABNORMAL LOW (ref 36.0–46.0)
Hemoglobin: 12.1 g/dL (ref 12.0–15.0)
Immature Granulocytes: 0 %
Lymphocytes Relative: 36 %
Lymphs Abs: 1.6 10*3/uL (ref 0.7–4.0)
MCH: 29.9 pg (ref 26.0–34.0)
MCHC: 34.6 g/dL (ref 30.0–36.0)
MCV: 86.4 fL (ref 80.0–100.0)
Monocytes Absolute: 0.3 10*3/uL (ref 0.1–1.0)
Monocytes Relative: 8 %
Neutro Abs: 2.4 10*3/uL (ref 1.7–7.7)
Neutrophils Relative %: 54 %
Platelet Count: 154 10*3/uL (ref 150–400)
RBC: 4.05 MIL/uL (ref 3.87–5.11)
RDW: 11.9 % (ref 11.5–15.5)
WBC Count: 4.4 10*3/uL (ref 4.0–10.5)
nRBC: 0 % (ref 0.0–0.2)

## 2021-12-17 LAB — CMP (CANCER CENTER ONLY)
ALT: 13 U/L (ref 0–44)
AST: 16 U/L (ref 15–41)
Albumin: 4.2 g/dL (ref 3.5–5.0)
Alkaline Phosphatase: 73 U/L (ref 38–126)
Anion gap: 5 (ref 5–15)
BUN: 22 mg/dL (ref 8–23)
CO2: 29 mmol/L (ref 22–32)
Calcium: 9.3 mg/dL (ref 8.9–10.3)
Chloride: 102 mmol/L (ref 98–111)
Creatinine: 0.55 mg/dL (ref 0.44–1.00)
GFR, Estimated: >60 mL/min (ref >60)
Glucose, Bld: 95 mg/dL (ref 70–99)
Potassium: 4.4 mmol/L (ref 3.5–5.1)
Sodium: 136 mmol/L (ref 135–145)
Total Bilirubin: 0.5 mg/dL (ref 0.3–1.2)
Total Protein: 6.5 g/dL (ref 6.5–8.1)

## 2021-12-17 MED ORDER — CETIRIZINE HCL 10 MG/ML IV SOLN
10.0000 mg | Freq: Once | INTRAVENOUS | Status: AC
  Administered 2021-12-17: 10 mg via INTRAVENOUS
  Filled 2021-12-17: qty 1

## 2021-12-17 MED ORDER — SODIUM CHLORIDE 0.9 % IV SOLN
10.0000 mg | Freq: Once | INTRAVENOUS | Status: AC
  Administered 2021-12-17: 10 mg via INTRAVENOUS
  Filled 2021-12-17: qty 10

## 2021-12-17 MED ORDER — APIXABAN 5 MG PO TABS: 5.0000 mg | ORAL_TABLET | Freq: Two times a day (BID) | ORAL | 3 refills | Status: DC

## 2021-12-17 MED ORDER — ACETAMINOPHEN 325 MG PO TABS
650.0000 mg | ORAL_TABLET | Freq: Once | ORAL | Status: AC
  Administered 2021-12-17: 650 mg via ORAL
  Filled 2021-12-17: qty 2

## 2021-12-17 MED ORDER — TRASTUZUMAB-DKST CHEMO 150 MG IV SOLR
2.0000 mg/kg | Freq: Once | INTRAVENOUS | Status: AC
  Administered 2021-12-17: 105 mg via INTRAVENOUS
  Filled 2021-12-17: qty 5

## 2021-12-17 MED ORDER — SODIUM CHLORIDE 0.9% FLUSH
10.0000 mL | INTRAVENOUS | Status: DC | PRN
  Administered 2021-12-17: 10 mL via INTRAVENOUS

## 2021-12-17 MED ORDER — FAMOTIDINE IN NACL 20-0.9 MG/50ML-% IV SOLN
20.0000 mg | Freq: Once | INTRAVENOUS | Status: AC
  Administered 2021-12-17: 20 mg via INTRAVENOUS
  Filled 2021-12-17: qty 50

## 2021-12-17 MED ORDER — SODIUM CHLORIDE 0.9% FLUSH
10.0000 mL | INTRAVENOUS | Status: DC | PRN
  Administered 2021-12-17: 10 mL

## 2021-12-17 MED ORDER — HEPARIN SOD (PORK) LOCK FLUSH 100 UNIT/ML IV SOLN
500.0000 [IU] | Freq: Once | INTRAVENOUS | Status: AC | PRN
  Administered 2021-12-17: 500 [IU]

## 2021-12-17 MED ORDER — SODIUM CHLORIDE 0.9 % IV SOLN: Freq: Once | INTRAVENOUS | Status: AC

## 2021-12-17 MED ORDER — SODIUM CHLORIDE 0.9 % IV SOLN
80.0000 mg/m2 | Freq: Once | INTRAVENOUS | Status: AC
  Administered 2021-12-17: 126 mg via INTRAVENOUS
  Filled 2021-12-17: qty 21

## 2021-12-17 NOTE — Patient Instructions (Signed)
[facility]   Discharge Instructions: Thank you for choosing [facility] to provide your oncology and hematology care.   If you have a lab appointment with the [facility], please go directly to the [facility] and check in at the registration area.   Wear comfortable clothing and clothing appropriate for easy access to any Portacath or PICC line.   We strive to give you quality time with your provider. You may need to reschedule your appointment if you arrive late (15 or more minutes).  Arriving late affects you and other patients whose appointments are after yours.  Also, if you miss three or more appointments without notifying the office, you may be dismissed from the clinic at the provider's discretion.      For prescription refill requests, have your pharmacy contact our office and allow 72 hours for refills to be completed.    Today you received the following chemotherapy and/or immunotherapy agents: trastuzumab and paclitaxel      To help prevent nausea and vomiting after your treatment, we encourage you to take your nausea medication as directed.  BELOW ARE SYMPTOMS THAT SHOULD BE REPORTED IMMEDIATELY: *FEVER GREATER THAN 100.4 F (38 C) OR HIGHER *CHILLS OR SWEATING *NAUSEA AND VOMITING THAT IS NOT CONTROLLED WITH YOUR NAUSEA MEDICATION *UNUSUAL SHORTNESS OF BREATH *UNUSUAL BRUISING OR BLEEDING *URINARY PROBLEMS (pain or burning when urinating, or frequent urination) *BOWEL PROBLEMS (unusual diarrhea, constipation, pain near the anus) TENDERNESS IN MOUTH AND THROAT WITH OR WITHOUT PRESENCE OF ULCERS (sore throat, sores in mouth, or a toothache) UNUSUAL RASH, SWELLING OR PAIN  UNUSUAL VAGINAL DISCHARGE OR ITCHING   Items with * indicate a potential emergency and should be followed up as soon as possible or go to the Emergency Department if any problems should occur.  Please show the CHEMOTHERAPY ALERT CARD or IMMUNOTHERAPY ALERT  CARD at check-in to the Emergency Department and triage nurse.  Should you have questions after your visit or need to cancel or reschedule your appointment, please contact [facility]  Dept: [phone]  and follow the prompts.  Office hours are 8:00 a.m. to 4:30 p.m. Monday - Friday. Please note that voicemails left after 4:00 p.m. may not be returned until the following business day.  We are closed weekends and major holidays. You have access to a nurse at all times for urgent questions. Please call the main number to the clinic Dept: [phone] and follow the prompts.   For any non-urgent questions, you may also contact your provider using MyChart. We now offer e-Visits for anyone 30 and older to request care online for non-urgent symptoms. For details visit [email or web address].   Also download the MyChart app! Go to the app store, search "MyChart", open the app, select [facility], and log in with your MyChart username and password.  Masks are optional in the cancer centers. If you would like for your care team to wear a mask while they are taking care of you, please let them know. You may have one support person who is at least 75 years old accompany you for your appointments.

## 2021-12-17 NOTE — Progress Notes (Signed)
[facility] CONSULT NOTE  Patient Care Team: [name], NP as PCP - General (Family Medicine) Emelia Loron, MD as Consulting Physician (General Surgery) Rachel Moulds, MD as Consulting Physician (Hematology and Oncology) Dorothy Puffer, MD as Consulting Physician (Radiation Oncology) Donnelly Angelica, RN as Oncology Nurse Navigator Pershing Proud, RN as Oncology Nurse Navigator  CHIEF COMPLAINTS/PURPOSE OF CONSULTATION:  Newly diagnosed breast cancer  HISTORY OF PRESENTING ILLNESS:   Patricia Marshall 76 y.o. female is here because of recent diagnosis of left breast cancer.  I reviewed her records extensively and collaborated the history with the patient.  SUMMARY OF ONCOLOGIC HISTORY: Oncology History  Malignant neoplasm of upper-inner quadrant of left breast in female, estrogen receptor positive (HCC)  09/20/2021 Mammogram   Screening mammogram showed extremely dense breasts and a possible mass in the left breast warranting further evaluation.  Breast density category is D Diagnostic mammogram of the breast showed indeterminate mass in the 11:00 location of the left breast.  Indeterminate mass in the lower anterior left axilla.   10/04/2021 Pathology Results   Pathology showed grade 2 invasive ductal carcinoma, left axillary soft tissue needle core biopsy showed benign breast tissue with patchy changes prognostics from the breast tumor showed ER 90% positive strong staining PR 0%, negative, Ki-67 of 30% and HER2 positive for IHC 3+   10/19/2021 Initial Diagnosis   Malignant neoplasm of upper-inner quadrant of left breast in female, estrogen receptor positive (HCC)   10/23/2021 Cancer Staging   Staging form: Breast, AJCC 8th Edition - Pathologic: Stage IIA (pT2, pN0, cM0, G3, ER+, PR-, HER2+) - Signed by Rachel Moulds, MD on 11/26/2021 Histologic grading system: 3 grade system   11/10/2021 Genetic Testing   Negative hereditary cancer genetic testing: no  pathogenic variants detected in [facility] CancerNext-Expanded +RNA Panel.  Report date is November 10, 2021.   The CancerNext-Expanded gene panel offered by [facility] and includes sequencing, rearrangement, and RNA analysis for the following 77 genes: AIP, ALK, APC, ATM, AXIN2, BAP1, BARD1, BLM, BMPR1A, BRCA1, BRCA2, BRIP1, CDC73, CDH1, CDK4, CDKN1B, CDKN2A, CHEK2, CTNNA1, DICER1, FANCC, FH, FLCN, GALNT12, KIF1B, LZTR1, MAX, MEN1, MET, MLH1, MSH2, MSH3, MSH6, MUTYH, NBN, NF1, NF2, NTHL1, PALB2, PHOX2B, PMS2, POT1, PRKAR1A, PTCH1, PTEN, RAD51C, RAD51D, RB1, RECQL, RET, SDHA, SDHAF2, SDHB, SDHC, SDHD, SMAD4, SMARCA4, SMARCB1, SMARCE1, STK11, SUFU, TMEM127, TP53, TSC1, TSC2, VHL and XRCC2 (sequencing and deletion/duplication); EGFR, EGLN1, HOXB13, KIT, MITF, PDGFRA, POLD1, and POLE (sequencing only); EPCAM and GREM1 (deletion/duplication only).    12/10/2021 -  Chemotherapy   Patient is on Treatment Plan : BREAST Paclitaxel + Trastuzumab q7d / Trastuzumab q21d      Interval history  Ms. Berland is here for follow-up before second weekly cycle of chemotherapy.  She did really well with the first cycle of chemotherapy.  She had no complaints except for mild episode of tingling in her right leg but she is not entirely sure if this lasted for more than a few seconds. She once again is nervous about having to go through further cycles of chemotherapy. She is hoping to celebrate her birthday, planning to spend some time with her friends and go to [facility]. Rest of the pertinent 10 point ROS reviewed and negative  MEDICAL HISTORY:  Past Medical History:  Diagnosis Date   Arthritis    Breast cancer (HCC)    Chicken pox    High cholesterol    PONV (postoperative nausea and vomiting)    Vitamin D deficiency  SURGICAL HISTORY: Past Surgical History:  Procedure Laterality Date   [place]   BREAST LUMPECTOMY WITH AXILLARY LYMPH NODE BIOPSY Left 11/15/2021   Procedure:  LEFT BREAST LUMPECTOMY WITH AXILLARY SENTINEL LYMPH NODE BIOPSY;  Surgeon: Rolm Bookbinder, MD;  Location: [facility];  Service: General;  Laterality: Left;   PORTACATH PLACEMENT Right 11/15/2021   Procedure: INSERTION PORT-A-CATH;  Surgeon: Rolm Bookbinder, MD;  Location: [facility];  Service: General;  Laterality: Right;   TONSILLECTOMY AND ADENOIDECTOMY  1953    SOCIAL HISTORY: Social History   Socioeconomic History   Marital status: Married    Spouse name: Not on file   Number of children: Not on file   Years of education: Not on file   Highest education level: Not on file  Occupational History   Not on file  Tobacco Use   Smoking status: Never   Smokeless tobacco: Never  Vaping Use   Vaping Use: Never used  Substance and Sexual Activity   Alcohol use: Yes    Alcohol/week: 1.0 standard drink of alcohol    Types: 1 Glasses of wine per week   Drug use: Never   Sexual activity: Not on file  Other Topics Concern   Not on file  Social History Narrative   Married    Retired    Investment banker, operational of Radio broadcast assistant Strain: [place]  (09/03/2021)   Overall Financial Resource Strain (CARDIA)    Difficulty of Paying Living Expenses: Not hard at all  Food Insecurity: No Food Insecurity (09/03/2021)   Hunger Vital Sign    Worried About Running Out of Food in the Last Year: Never true    [place] in the Last Year: Never true  Transportation Needs: No Transportation Needs (09/03/2021)   PRAPARE - Hydrologist (Medical): No    Lack of Transportation (Non-Medical): No  Physical Activity: Sufficiently Active (09/03/2021)   Exercise Vital Sign    Days of Exercise per Week: 4 days    Minutes of Exercise per Session: 120 min  Stress: No Stress Concern Present (09/03/2021)   [place]    Feeling of Stress : Not at all  Social  Connections: [place] (09/03/2021)   Social Connection and Isolation Panel [NHANES]    Frequency of Communication with Friends and Family: More than three times a week    Frequency of Social Gatherings with Friends and Family: More than three times a week    Attends Religious Services: More than 4 times per year    Active Member of [employer] or Organizations: Yes    Attends Music therapist: More than 4 times per year    Marital Status: Married  Human resources officer Violence: Not At Risk (09/03/2021)   Humiliation, Afraid, Rape, and Kick questionnaire    Fear of Current or Ex-Partner: No    Emotionally Abused: No    Physically Abused: No    Sexually Abused: No    FAMILY HISTORY: Family History  Problem Relation Age of Onset   Stomach cancer Mother 50   Heart attack Father 74   Cancer Sister        dx mid 11s; unknown primary w/ mets; treated like ovarian cancer   Heart attack Brother 8   Heart attack Brother 12    ALLERGIES:  is allergic to  other and ampicillin.  MEDICATIONS:  Current Outpatient Medications  Medication Sig Dispense Refill   apixaban (ELIQUIS) 5 MG TABS tablet Take 1 tablet (5 mg total) by mouth 2 (two) times daily. 60 tablet 3   Cholecalciferol (VITAMIN D) 50 MCG (2000 UT) tablet Take 2,000 Units by mouth daily.     lidocaine-prilocaine (EMLA) cream Apply to affected area once 30 g 3   ondansetron (ZOFRAN) 4 MG tablet Take 1 tablet (4 mg total) by mouth daily as needed for nausea or vomiting. 10 tablet 0   ondansetron (ZOFRAN) 8 MG tablet Take 1 tablet (8 mg total) by mouth every 8 (eight) hours as needed for nausea or vomiting. 30 tablet 1   prochlorperazine (COMPAZINE) 10 MG tablet Take 1 tablet (10 mg total) by mouth every 6 (six) hours as needed for nausea or vomiting. 30 tablet 1   traMADol (ULTRAM) 50 MG tablet Take 1 tablet (50 mg total) by mouth every 6 (six) hours as needed. 10 tablet 0   No current facility-administered medications for  this visit.   Facility-Administered Medications Ordered in Other Visits  Medication Dose Route Frequency Provider Last Rate Last Admin   famotidine (PEPCID) IVPB 20 mg premix  20 mg Intravenous Once Breylen Agyeman, Arletha Pili, MD 200 mL/hr at 12/17/21 1339 20 mg at 12/17/21 1339   heparin lock flush 100 unit/mL  500 Units Intracatheter Once PRN Yoland Scherr, Arletha Pili, MD       PACLitaxel (TAXOL) 126 mg in sodium chloride 0.9 % 250 mL chemo infusion (</= $RemoveBefor'80mg'drWjXdimJlEg$ /m2)  80 mg/m2 (Treatment Plan Recorded) Intravenous Once Jeret Goyer, Arletha Pili, MD       sodium chloride flush (NS) 0.9 % injection 10 mL  10 mL Intracatheter PRN Elis Sauber, MD       trastuzumab-dkst (OGIVRI) 105 mg in sodium chloride 0.9 % 250 mL chemo infusion  2 mg/kg (Treatment Plan Recorded) Intravenous Once Conchita Truxillo, Arletha Pili, MD        REVIEW OF SYSTEMS:   Constitutional: Denies fevers, chills or abnormal night sweats Eyes: Denies blurriness of vision, double vision or watery eyes Ears, nose, mouth, throat, and face: Denies mucositis or sore throat Respiratory: Denies cough, dyspnea or wheezes Cardiovascular: Denies palpitation, chest discomfort or lower extremity swelling Gastrointestinal:  Denies nausea, heartburn or change in bowel habits Skin: Denies abnormal skin rashes Lymphatics: Denies new lymphadenopathy or easy bruising Neurological:Denies numbness, tingling or new weaknesses Behavioral/Psych: Mood is stable, no new changes  Breast: Denies any palpable lumps or discharge All other systems were reviewed with the patient and are negative.  PHYSICAL EXAMINATION: ECOG PERFORMANCE STATUS: 0 - Asymptomatic  Vitals:   12/17/21 1202  BP: (!) 158/82  Pulse: 61  Temp: 97.7 F (36.5 C)  SpO2: 100%    Filed Weights   12/17/21 1202  Weight: 115 lb 14.4 oz (52.6 kg)   Physical Exam Constitutional:      Appearance: Normal appearance. She is normal weight.  Cardiovascular:     Rate and Rhythm: Normal rate and regular rhythm.     Pulses:  Normal pulses.     Heart sounds: Normal heart sounds.  Pulmonary:     Effort: Pulmonary effort is normal.     Breath sounds: Normal breath sounds.  Musculoskeletal:        General: No swelling or tenderness.     Cervical back: Normal range of motion and neck supple. No rigidity.  Lymphadenopathy:     Cervical: No cervical adenopathy.  Skin:    General: Skin  is warm and dry.  Neurological:     General: No focal deficit present.     Mental Status: She is alert.  Psychiatric:        Mood and Affect: Mood normal.    LABORATORY DATA:  I have reviewed the data as listed Lab Results  Component Value Date   WBC 4.4 12/17/2021   HGB 12.1 12/17/2021   HCT 35.0 (L) 12/17/2021   MCV 86.4 12/17/2021   PLT 154 12/17/2021   Lab Results  Component Value Date   NA 136 12/17/2021   K 4.4 12/17/2021   CL 102 12/17/2021   CO2 29 12/17/2021    RADIOGRAPHIC STUDIES: I have personally reviewed the radiological reports and agreed with the findings in the report.  ASSESSMENT AND PLAN:  Malignant neoplasm of upper-inner quadrant of left breast in female, estrogen receptor positive ([place]) This is a very pleasant 76 year old postmenopausal female patient with newly diagnosed left breast upper inner quadrant invasive ductal carcinoma, grade 2, ER +90% strong staining, PR 0% negative, HER2 positive by IHC 3+, Ki-67 of 30% referred to breast [place] for additional recommendations.  She had left breast lumpectomy which showed a T2 N0 tumor hence we have discussed about adjuvant Taxol and Herceptin.  She is here for her first cycle of chemotherapy.  No concerns on physical exam today.  Okay to proceed with the chemotherapy as scheduled.  Return to clinic every other appointment for provider visit whereas weekly for chemotherapy.  All their questions were once again answered to the best my knowledge.  She is here after planned for cycle of chemotherapy.  She tolerated this very well.  No complaints.  Physical  examination today unremarkable.  She will proceed with the second cycle as planned.  She once again had a several questions about chemotherapy plan, neutropenic precautions and adverse effects of chemotherapy.  Total time spent: 30 minutes including history, physical exam, review of records, counseling and coordination of care All questions were answered. The patient knows to call the clinic with any problems, questions or concerns.    Benay Pike, MD 12/17/21

## 2021-12-17 NOTE — Assessment & Plan Note (Signed)
This is a very pleasant 76 year old postmenopausal female patient with newly diagnosed left breast upper inner quadrant invasive ductal carcinoma, grade 2, ER +90% strong staining, PR 0% negative, HER2 positive by IHC 3+, Ki-67 of 30% referred to breast [facility] for additional recommendations.  She had left breast lumpectomy which showed a T2 N0 tumor hence we have discussed about adjuvant Taxol and Herceptin.  She is here for her first cycle of chemotherapy.  No concerns on physical exam today.  Okay to proceed with the chemotherapy as scheduled.  Return to clinic every other appointment for provider visit whereas weekly for chemotherapy.  All their questions were once again answered to the best my knowledge.  She is here after planned for cycle of chemotherapy.  She tolerated this very well.  No complaints.  Physical examination today unremarkable.  She will proceed with the second cycle as planned.  She once again had a several questions about chemotherapy plan, neutropenic precautions and adverse effects of chemotherapy.

## 2021-12-24 MED FILL — Dexamethasone Sodium Phosphate Inj 100 MG/10ML: INTRAMUSCULAR | Qty: 1 | Status: AC

## 2021-12-25 ENCOUNTER — Inpatient Hospital Stay (HOSPITAL_BASED_OUTPATIENT_CLINIC_OR_DEPARTMENT_OTHER): Payer: Medicare HMO | Admitting: Adult Health

## 2021-12-25 ENCOUNTER — Encounter: Payer: Self-pay | Admitting: Adult Health

## 2021-12-25 ENCOUNTER — Inpatient Hospital Stay: Payer: Medicare HMO

## 2021-12-25 DIAGNOSIS — Z170 Estrogen receptor positive status [ER+]: Secondary | ICD-10-CM | POA: Diagnosis not present

## 2021-12-25 DIAGNOSIS — C50212 Malignant neoplasm of upper-inner quadrant of left female breast: Secondary | ICD-10-CM

## 2021-12-25 DIAGNOSIS — Z5111 Encounter for antineoplastic chemotherapy: Secondary | ICD-10-CM | POA: Diagnosis not present

## 2021-12-25 DIAGNOSIS — Z5112 Encounter for antineoplastic immunotherapy: Secondary | ICD-10-CM | POA: Diagnosis not present

## 2021-12-25 LAB — CBC WITH DIFFERENTIAL (CANCER CENTER ONLY)
Abs Immature Granulocytes: 0.02 10*3/uL (ref 0.00–0.07)
Basophils Absolute: 0 10*3/uL (ref 0.0–0.1)
Basophils Relative: 1 %
Eosinophils Absolute: 0.1 10*3/uL (ref 0.0–0.5)
Eosinophils Relative: 1 %
HCT: 32.9 % — ABNORMAL LOW (ref 36.0–46.0)
Hemoglobin: 11.4 g/dL — ABNORMAL LOW (ref 12.0–15.0)
Immature Granulocytes: 1 %
Lymphocytes Relative: 38 %
Lymphs Abs: 1.4 10*3/uL (ref 0.7–4.0)
MCH: 29.8 pg (ref 26.0–34.0)
MCHC: 34.7 g/dL (ref 30.0–36.0)
MCV: 85.9 fL (ref 80.0–100.0)
Monocytes Absolute: 0.4 10*3/uL (ref 0.1–1.0)
Monocytes Relative: 10 %
Neutro Abs: 1.9 10*3/uL (ref 1.7–7.7)
Neutrophils Relative %: 49 %
Platelet Count: 182 10*3/uL (ref 150–400)
RBC: 3.83 MIL/uL — ABNORMAL LOW (ref 3.87–5.11)
RDW: 12.5 % (ref 11.5–15.5)
WBC Count: 3.8 10*3/uL — ABNORMAL LOW (ref 4.0–10.5)
nRBC: 0 % (ref 0.0–0.2)

## 2021-12-25 LAB — CMP (CANCER CENTER ONLY)
ALT: 15 U/L (ref 0–44)
AST: 16 U/L (ref 15–41)
Albumin: 4.1 g/dL (ref 3.5–5.0)
Alkaline Phosphatase: 68 U/L (ref 38–126)
Anion gap: 3 — ABNORMAL LOW (ref 5–15)
BUN: 18 mg/dL (ref 8–23)
CO2: 28 mmol/L (ref 22–32)
Calcium: 9.1 mg/dL (ref 8.9–10.3)
Chloride: 106 mmol/L (ref 98–111)
Creatinine: 0.6 mg/dL (ref 0.44–1.00)
GFR, Estimated: >60 mL/min (ref >60)
Glucose, Bld: 124 mg/dL — ABNORMAL HIGH (ref 70–99)
Potassium: 4.1 mmol/L (ref 3.5–5.1)
Sodium: 137 mmol/L (ref 135–145)
Total Bilirubin: 0.4 mg/dL (ref 0.3–1.2)
Total Protein: 6.8 g/dL (ref 6.5–8.1)

## 2021-12-25 MED ORDER — SODIUM CHLORIDE 0.9% FLUSH
10.0000 mL | INTRAVENOUS | Status: DC | PRN
  Administered 2021-12-25: 10 mL

## 2021-12-25 MED ORDER — SODIUM CHLORIDE 0.9 % IV SOLN: Freq: Once | INTRAVENOUS | Status: AC

## 2021-12-25 MED ORDER — SODIUM CHLORIDE 0.9% FLUSH
10.0000 mL | INTRAVENOUS | Status: DC | PRN
  Administered 2021-12-25: 10 mL via INTRAVENOUS

## 2021-12-25 MED ORDER — ACETAMINOPHEN 325 MG PO TABS
650.0000 mg | ORAL_TABLET | Freq: Once | ORAL | Status: AC
  Administered 2021-12-25: 650 mg via ORAL
  Filled 2021-12-25: qty 2

## 2021-12-25 MED ORDER — CETIRIZINE HCL 10 MG/ML IV SOLN
10.0000 mg | Freq: Once | INTRAVENOUS | Status: AC
  Administered 2021-12-25: 10 mg via INTRAVENOUS
  Filled 2021-12-25: qty 1

## 2021-12-25 MED ORDER — FAMOTIDINE IN NACL 20-0.9 MG/50ML-% IV SOLN
20.0000 mg | Freq: Once | INTRAVENOUS | Status: AC
  Administered 2021-12-25: 20 mg via INTRAVENOUS
  Filled 2021-12-25: qty 50

## 2021-12-25 MED ORDER — SODIUM CHLORIDE 0.9 % IV SOLN
80.0000 mg/m2 | Freq: Once | INTRAVENOUS | Status: AC
  Administered 2021-12-25: 126 mg via INTRAVENOUS
  Filled 2021-12-25: qty 21

## 2021-12-25 MED ORDER — TRASTUZUMAB-DKST CHEMO 150 MG IV SOLR
2.0000 mg/kg | Freq: Once | INTRAVENOUS | Status: AC
  Administered 2021-12-25: 105 mg via INTRAVENOUS
  Filled 2021-12-25: qty 5

## 2021-12-25 MED ORDER — HEPARIN SOD (PORK) LOCK FLUSH 100 UNIT/ML IV SOLN
500.0000 [IU] | Freq: Once | INTRAVENOUS | Status: AC | PRN
  Administered 2021-12-25: 500 [IU]

## 2021-12-25 MED ORDER — SODIUM CHLORIDE 0.9 % IV SOLN
10.0000 mg | Freq: Once | INTRAVENOUS | Status: AC
  Administered 2021-12-25: 10 mg via INTRAVENOUS
  Filled 2021-12-25: qty 10

## 2021-12-25 NOTE — Assessment & Plan Note (Signed)
Patricia Marshall is a 76 year old woman with stage IIa estrogen and HER2 positive breast cancer status postlumpectomy, currently receiving adjuvant chemotherapy and immunotherapy with Taxol and Herceptin.  Patricia Marshall will continue on Taxol Herceptin weekly.  Today she is due for week number 3 out of 12.  She is tolerating it well thus far with mild fatigue.  We spent a long time talking about potential taste changes, important foods to take, and her overall treatment plan.  Reviewed her labs with her in detail which are stable.  We also discussed her overall treatment plan and how long she will receive chemotherapy and immunotherapy.  Very will return in 1 week for labs and chemo and in 2 weeks for labs, follow-up, chemotherapy.

## 2021-12-25 NOTE — Patient Instructions (Signed)
[facility]   Discharge Instructions: Thank you for choosing [facility] to provide your oncology and hematology care.   If you have a lab appointment with the [facility], please go directly to the [facility] and check in at the registration area.   Wear comfortable clothing and clothing appropriate for easy access to any Portacath or PICC line.   We strive to give you quality time with your provider. You may need to reschedule your appointment if you arrive late (15 or more minutes).  Arriving late affects you and other patients whose appointments are after yours.  Also, if you miss three or more appointments without notifying the office, you may be dismissed from the clinic at the provider's discretion.      For prescription refill requests, have your pharmacy contact our office and allow 72 hours for refills to be completed.    Today you received the following chemotherapy and/or immunotherapy agents: trastuzumab and paclitaxel      To help prevent nausea and vomiting after your treatment, we encourage you to take your nausea medication as directed.  BELOW ARE SYMPTOMS THAT SHOULD BE REPORTED IMMEDIATELY: *FEVER GREATER THAN 100.4 F (38 C) OR HIGHER *CHILLS OR SWEATING *NAUSEA AND VOMITING THAT IS NOT CONTROLLED WITH YOUR NAUSEA MEDICATION *UNUSUAL SHORTNESS OF BREATH *UNUSUAL BRUISING OR BLEEDING *URINARY PROBLEMS (pain or burning when urinating, or frequent urination) *BOWEL PROBLEMS (unusual diarrhea, constipation, pain near the anus) TENDERNESS IN MOUTH AND THROAT WITH OR WITHOUT PRESENCE OF ULCERS (sore throat, sores in mouth, or a toothache) UNUSUAL RASH, SWELLING OR PAIN  UNUSUAL VAGINAL DISCHARGE OR ITCHING   Items with * indicate a potential emergency and should be followed up as soon as possible or go to the Emergency Department if any problems should occur.  Please show the CHEMOTHERAPY ALERT CARD or IMMUNOTHERAPY ALERT  CARD at check-in to the Emergency Department and triage nurse.  Should you have questions after your visit or need to cancel or reschedule your appointment, please contact [facility]  Dept: [phone]  and follow the prompts.  Office hours are 8:00 a.m. to 4:30 p.m. Monday - Friday. Please note that voicemails left after 4:00 p.m. may not be returned until the following business day.  We are closed weekends and major holidays. You have access to a nurse at all times for urgent questions. Please call the main number to the clinic Dept: [phone] and follow the prompts.   For any non-urgent questions, you may also contact your provider using MyChart. We now offer e-Visits for anyone 4 and older to request care online for non-urgent symptoms. For details visit [email or web address].   Also download the MyChart app! Go to the app store, search "MyChart", open the app, select [facility], and log in with your MyChart username and password.  Masks are optional in the cancer centers. If you would like for your care team to wear a mask while they are taking care of you, please let them know. You may have one support person who is at least 75 years old accompany you for your appointments.

## 2021-12-25 NOTE — Progress Notes (Signed)
[facility] Follow up:    Patricia Peng, NP [place] [address]   DIAGNOSIS:  Cancer Staging  Malignant neoplasm of upper-inner quadrant of left breast in female, estrogen receptor positive ([place]) Staging form: Breast, AJCC 8th Edition - Pathologic: Stage IIA (pT2, pN0, cM0, G3, ER+, PR-, HER2+) - Signed by Benay Pike, MD on 11/26/2021 Histologic grading system: 3 grade system   SUMMARY OF ONCOLOGIC HISTORY: Oncology History  Malignant neoplasm of upper-inner quadrant of left breast in female, estrogen receptor positive ([place])  09/20/2021 Mammogram   Screening mammogram showed extremely dense breasts and a possible mass in the left breast warranting further evaluation.  Breast density category is D Diagnostic mammogram of the breast showed indeterminate mass in the 11:00 location of the left breast.  Indeterminate mass in the lower anterior left axilla.   10/04/2021 Pathology Results   Pathology showed grade 2 invasive ductal carcinoma, left axillary soft tissue needle core biopsy showed benign breast tissue with patchy changes prognostics from the breast tumor showed ER 90% positive strong staining PR 0%, negative, Ki-67 of 30% and HER2 positive for IHC 3+   10/19/2021 Initial Diagnosis   Malignant neoplasm of upper-inner quadrant of left breast in female, estrogen receptor positive ([place])   10/23/2021 Cancer Staging   Staging form: Breast, AJCC 8th Edition - Pathologic: Stage IIA (pT2, pN0, cM0, G3, ER+, PR-, HER2+) - Signed by Benay Pike, MD on 11/26/2021 Histologic grading system: 3 grade system   11/10/2021 Genetic Testing   Negative hereditary cancer genetic testing: no pathogenic variants detected in [facility] CancerNext-Expanded +RNA Panel.  Report date is November 10, 2021.   The CancerNext-Expanded gene panel offered by [facility] and includes sequencing, rearrangement, and RNA analysis for the following 77 genes: AIP, ALK, APC,  ATM, AXIN2, BAP1, BARD1, BLM, BMPR1A, BRCA1, BRCA2, BRIP1, CDC73, CDH1, CDK4, CDKN1B, CDKN2A, CHEK2, CTNNA1, DICER1, FANCC, FH, FLCN, GALNT12, KIF1B, LZTR1, MAX, MEN1, MET, MLH1, MSH2, MSH3, MSH6, MUTYH, NBN, NF1, NF2, NTHL1, PALB2, PHOX2B, PMS2, POT1, PRKAR1A, PTCH1, PTEN, RAD51C, RAD51D, RB1, RECQL, RET, SDHA, SDHAF2, SDHB, SDHC, SDHD, SMAD4, SMARCA4, SMARCB1, SMARCE1, STK11, SUFU, TMEM127, TP53, TSC1, TSC2, VHL and XRCC2 (sequencing and deletion/duplication); EGFR, EGLN1, HOXB13, KIT, MITF, PDGFRA, POLD1, and POLE (sequencing only); EPCAM and GREM1 (deletion/duplication only).    12/10/2021 -  Chemotherapy   Patient is on Treatment Plan : BREAST Paclitaxel + Trastuzumab q7d / Trastuzumab q21d       CURRENT THERAPY: taxol/herceptin  INTERVAL HISTORY: Patricia Marshall 76 y.o. female returns for follow-up prior to receiving her weekly Taxol and Herceptin.  She is tolerating this well with the exception of some fatigue.  She denies any peripheral neuropathy.  Her husband is a normal Cytogeneticist and so they have been very mindful of their food intake along with where their food is coming from.   Patient Active Problem List   Diagnosis Date Noted   Genetic testing 12/07/2021   Breast cancer ([place]) 11/15/2021   Malignant neoplasm of upper-inner quadrant of left breast in female, estrogen receptor positive ([place]) 10/19/2021   Piriformis syndrome of left side 07/12/2015   Degenerative arthritis of left knee 07/12/2015   BEE STING REACTION, LOCAL 10/24/2006    is allergic to other, ampicillin, and anesthesia [id number] [propofol].  MEDICAL HISTORY: Past Medical History:  Diagnosis Date   Arthritis    Breast cancer ([place])    Chicken pox    High cholesterol    PONV (postoperative  nausea and vomiting)    Vitamin D deficiency     SURGICAL HISTORY: Past Surgical History:  Procedure Laterality Date   APPENDECTOMY  1959   BACK SURGERY     1996   BREAST LUMPECTOMY WITH AXILLARY LYMPH NODE  BIOPSY Left 11/15/2021   Procedure: LEFT BREAST LUMPECTOMY WITH AXILLARY SENTINEL LYMPH NODE BIOPSY;  Surgeon: Emelia Loron, MD;  Location: [facility];  Service: General;  Laterality: Left;   PORTACATH PLACEMENT Right 11/15/2021   Procedure: INSERTION PORT-A-CATH;  Surgeon: Emelia Loron, MD;  Location: [facility];  Service: General;  Laterality: Right;   TONSILLECTOMY AND ADENOIDECTOMY  1954    SOCIAL HISTORY: Social History   Socioeconomic History   Marital status: Married    Spouse name: Not on file   Number of children: Not on file   Years of education: Not on file   Highest education level: Not on file  Occupational History   Not on file  Tobacco Use   Smoking status: Never   Smokeless tobacco: Never  Vaping Use   Vaping Use: Never used  Substance and Sexual Activity   Alcohol use: Yes    Alcohol/week: 1.0 standard drink of alcohol    Types: 1 Glasses of wine per week   Drug use: Never   Sexual activity: Not on file  Other Topics Concern   Not on file  Social History Narrative   Married    Retired    International aid/development worker of Corporate investment banker Strain: Low Risk  (09/03/2021)   Overall Financial Resource Strain (CARDIA)    Difficulty of Paying Living Expenses: Not hard at all  Food Insecurity: No Food Insecurity (09/03/2021)   Hunger Vital Sign    Worried About Running Out of Food in the Last Year: Never true    Ran Out of Food in the Last Year: Never true  Transportation Needs: No Transportation Needs (09/03/2021)   PRAPARE - Administrator, Civil Service (Medical): No    Lack of Transportation (Non-Medical): No  Physical Activity: Sufficiently Active (09/03/2021)   Exercise Vital Sign    Days of Exercise per Week: 4 days    Minutes of Exercise per Session: 120 min  Stress: No Stress Concern Present (09/03/2021)   [employer] of Occupational Health - Occupational Stress Questionnaire    Feeling of  Stress : Not at all  Social Connections: Socially Integrated (09/03/2021)   Social Connection and Isolation Panel [NHANES]    Frequency of Communication with Friends and Family: More than three times a week    Frequency of Social Gatherings with Friends and Family: More than three times a week    Attends Religious Services: More than 4 times per year    Active Member of [employer] or Organizations: Yes    Attends Engineer, structural: More than 4 times per year    Marital Status: Married  Catering manager Violence: Not At Risk (09/03/2021)   Humiliation, Afraid, Rape, and Kick questionnaire    Fear of Current or Ex-Partner: No    Emotionally Abused: No    Physically Abused: No    Sexually Abused: No    FAMILY HISTORY: Family History  Problem Relation Age of Onset   Stomach cancer Mother 39   Heart attack Father 53   Cancer Sister        dx mid 30s; unknown primary w/ mets; treated like ovarian cancer   Heart attack Brother 53  Heart attack Brother 24    Review of Systems  Constitutional:  Positive for fatigue. Negative for appetite change, chills, fever and unexpected weight change.  HENT:   Negative for hearing loss, lump/mass and trouble swallowing.   Eyes:  Negative for eye problems and icterus.  Respiratory:  Negative for chest tightness, cough and shortness of breath.   Cardiovascular:  Negative for chest pain, leg swelling and palpitations.  Gastrointestinal:  Negative for abdominal distention, abdominal pain, constipation, diarrhea, nausea and vomiting.  Endocrine: Negative for hot flashes.  Genitourinary:  Negative for difficulty urinating.   Musculoskeletal:  Negative for arthralgias.  Skin:  Negative for itching and rash.  Neurological:  Negative for dizziness, extremity weakness, headaches and numbness.  Hematological:  Negative for adenopathy. Does not bruise/bleed easily.  Psychiatric/Behavioral:  Negative for depression. The patient is not nervous/anxious.        PHYSICAL EXAMINATION  ECOG PERFORMANCE STATUS: 1 - Symptomatic but completely ambulatory  Vitals:   12/25/21 1220  BP: (!) 153/56  Pulse: 60  Resp: 16  Temp: 97.7 F (36.5 C)  SpO2: 100%    Physical Exam Constitutional:      General: She is not in acute distress.    Appearance: Normal appearance. She is not toxic-appearing.  HENT:     Head: Normocephalic and atraumatic.  Eyes:     General: No scleral icterus. Cardiovascular:     Rate and Rhythm: Normal rate and regular rhythm.     Pulses: Normal pulses.     Heart sounds: Normal heart sounds.  Pulmonary:     Effort: Pulmonary effort is normal.     Breath sounds: Normal breath sounds.  Abdominal:     General: Abdomen is flat. Bowel sounds are normal. There is no distension.     Palpations: Abdomen is soft.     Tenderness: There is no abdominal tenderness.  Musculoskeletal:        General: No swelling.     Cervical back: Neck supple.  Lymphadenopathy:     Cervical: No cervical adenopathy.  Skin:    General: Skin is warm and dry.     Findings: No rash.  Neurological:     General: No focal deficit present.     Mental Status: She is alert.  Psychiatric:        Mood and Affect: Mood normal.        Behavior: Behavior normal.     LABORATORY DATA:  CBC    Component Value Date/Time   WBC 3.8 (L) 12/25/2021 1155   WBC 8.7 11/19/2021 1645   RBC 3.83 (L) 12/25/2021 1155   HGB 11.4 (L) 12/25/2021 1155   HCT 32.9 (L) 12/25/2021 1155   PLT 182 12/25/2021 1155   MCV 85.9 12/25/2021 1155   MCH 29.8 12/25/2021 1155   MCHC 34.7 12/25/2021 1155   RDW 12.5 12/25/2021 1155   LYMPHSABS 1.4 12/25/2021 1155   MONOABS 0.4 12/25/2021 1155   EOSABS 0.1 12/25/2021 1155   BASOSABS 0.0 12/25/2021 1155    CMP     Component Value Date/Time   NA 137 12/25/2021 1155   K 4.1 12/25/2021 1155   CL 106 12/25/2021 1155   CO2 28 12/25/2021 1155   GLUCOSE 124 (H) 12/25/2021 1155   BUN 18 12/25/2021 1155   CREATININE 0.60  12/25/2021 1155   CREATININE 0.76 11/03/2019 0921   CALCIUM 9.1 12/25/2021 1155   PROT 6.8 12/25/2021 1155   ALBUMIN 4.1 12/25/2021 1155   AST 16  12/25/2021 1155   ALT 15 12/25/2021 1155   ALKPHOS 68 12/25/2021 1155   BILITOT 0.4 12/25/2021 1155   GFRNONAA >60 12/25/2021 1155   GFRNONAA 78 11/03/2019 0921   GFRAA 91 11/03/2019 0921       ASSESSMENT and THERAPY PLAN:   Malignant neoplasm of upper-inner quadrant of left breast in female, estrogen receptor positive ([place]) Patricia Marshall is a 76 year old woman with stage IIa estrogen and HER2 positive breast cancer status postlumpectomy, currently receiving adjuvant chemotherapy and immunotherapy with Taxol and Herceptin.  [place] will continue on Taxol Herceptin weekly.  Today she is due for week number 3 out of 12.  She is tolerating it well thus far with mild fatigue.  We spent a long time talking about potential taste changes, important foods to take, and her overall treatment plan.  Reviewed her labs with her in detail which are stable.  We also discussed her overall treatment plan and how long she will receive chemotherapy and immunotherapy.  Very will return in 1 week for labs and chemo and in 2 weeks for labs, follow-up, chemotherapy.   All questions were answered. The patient knows to call the clinic with any problems, questions or concerns. We can certainly see the patient much sooner if necessary.  Total encounter time:30 minutes*in face-to-face visit time, chart review, lab review, care coordination, order entry, and documentation of the encounter time.    Wilber Bihari, NP 12/25/21 4:54 PM Medical Oncology and Hematology [facility] [place], [place] [address] Tel. [phone]    Fax. [phone]  *Total Encounter Time as defined by the Centers for Medicare and Medicaid Services includes, in addition to the face-to-face time of a patient visit (documented in the note above) non-face-to-face time:  obtaining and reviewing outside history, ordering and reviewing medications, tests or procedures, care coordination (communications with other health care professionals or caregivers) and documentation in the medical record.

## 2021-12-27 ENCOUNTER — Other Ambulatory Visit: Payer: Self-pay | Admitting: *Deleted

## 2021-12-27 ENCOUNTER — Telehealth: Payer: Self-pay | Admitting: *Deleted

## 2021-12-27 NOTE — Progress Notes (Signed)
This RN spoke with pt who asks about protocol for the "icing therapy for my hands and feet"  She states she had the ice packs on her first 2 treatments- but with last treatment it was not done- she forgot to ask and then when she did she stated the nurse was not aware of need for above.  This RN spoke to charge nurse per above as well as added to supportive therapy.

## 2021-12-27 NOTE — Telephone Encounter (Signed)
This RN returned call to pt per her VM wanting to ask about " protocol with chemotherapy " question.  Obtained identified VM- message left for pt to call again.

## 2021-12-28 MED FILL — Dexamethasone Sodium Phosphate Inj 100 MG/10ML: INTRAMUSCULAR | Qty: 1 | Status: AC

## 2021-12-31 ENCOUNTER — Inpatient Hospital Stay: Payer: Medicare HMO

## 2021-12-31 ENCOUNTER — Encounter: Payer: Self-pay | Admitting: Hematology and Oncology

## 2021-12-31 ENCOUNTER — Inpatient Hospital Stay: Payer: Medicare HMO | Admitting: Hematology and Oncology

## 2021-12-31 VITALS — BP 150/73 | HR 64 | Temp 97.8°F | Resp 16 | Ht 65.0 in | Wt 116.4 lb

## 2021-12-31 DIAGNOSIS — C50212 Malignant neoplasm of upper-inner quadrant of left female breast: Secondary | ICD-10-CM

## 2021-12-31 DIAGNOSIS — Z170 Estrogen receptor positive status [ER+]: Secondary | ICD-10-CM

## 2021-12-31 DIAGNOSIS — Z5111 Encounter for antineoplastic chemotherapy: Secondary | ICD-10-CM | POA: Diagnosis not present

## 2021-12-31 DIAGNOSIS — Z95828 Presence of other vascular implants and grafts: Secondary | ICD-10-CM

## 2021-12-31 DIAGNOSIS — Z5112 Encounter for antineoplastic immunotherapy: Secondary | ICD-10-CM | POA: Diagnosis not present

## 2021-12-31 LAB — CMP (CANCER CENTER ONLY)
ALT: 20 U/L (ref 0–44)
AST: 19 U/L (ref 15–41)
Albumin: 4.2 g/dL (ref 3.5–5.0)
Alkaline Phosphatase: 69 U/L (ref 38–126)
Anion gap: 3 — ABNORMAL LOW (ref 5–15)
BUN: 21 mg/dL (ref 8–23)
CO2: 28 mmol/L (ref 22–32)
Calcium: 9.4 mg/dL (ref 8.9–10.3)
Chloride: 105 mmol/L (ref 98–111)
Creatinine: 0.6 mg/dL (ref 0.44–1.00)
GFR, Estimated: >60 mL/min (ref >60)
Glucose, Bld: 101 mg/dL — ABNORMAL HIGH (ref 70–99)
Potassium: 4.3 mmol/L (ref 3.5–5.1)
Sodium: 136 mmol/L (ref 135–145)
Total Bilirubin: 0.6 mg/dL (ref 0.3–1.2)
Total Protein: 6.7 g/dL (ref 6.5–8.1)

## 2021-12-31 LAB — CBC WITH DIFFERENTIAL (CANCER CENTER ONLY)
Abs Immature Granulocytes: 0.02 10*3/uL (ref 0.00–0.07)
Basophils Absolute: 0.1 10*3/uL (ref 0.0–0.1)
Basophils Relative: 1 %
Eosinophils Absolute: 0 10*3/uL (ref 0.0–0.5)
Eosinophils Relative: 1 %
HCT: 33.6 % — ABNORMAL LOW (ref 36.0–46.0)
Hemoglobin: 11.7 g/dL — ABNORMAL LOW (ref 12.0–15.0)
Immature Granulocytes: 1 %
Lymphocytes Relative: 38 %
Lymphs Abs: 1.5 10*3/uL (ref 0.7–4.0)
MCH: 30.4 pg (ref 26.0–34.0)
MCHC: 34.8 g/dL (ref 30.0–36.0)
MCV: 87.3 fL (ref 80.0–100.0)
Monocytes Absolute: 0.2 10*3/uL (ref 0.1–1.0)
Monocytes Relative: 6 %
Neutro Abs: 2.1 10*3/uL (ref 1.7–7.7)
Neutrophils Relative %: 53 %
Platelet Count: 183 10*3/uL (ref 150–400)
RBC: 3.85 MIL/uL — ABNORMAL LOW (ref 3.87–5.11)
RDW: 12.9 % (ref 11.5–15.5)
WBC Count: 4 10*3/uL (ref 4.0–10.5)
nRBC: 0 % (ref 0.0–0.2)

## 2021-12-31 MED ORDER — SODIUM CHLORIDE 0.9% FLUSH: 10.0000 mL | INTRAVENOUS | Status: DC | PRN

## 2021-12-31 MED ORDER — SODIUM CHLORIDE 0.9% FLUSH
10.0000 mL | INTRAVENOUS | Status: AC | PRN
  Administered 2021-12-31: 10 mL

## 2021-12-31 MED ORDER — SODIUM CHLORIDE 0.9 % IV SOLN
80.0000 mg/m2 | Freq: Once | INTRAVENOUS | Status: AC
  Administered 2021-12-31: 126 mg via INTRAVENOUS
  Filled 2021-12-31: qty 21

## 2021-12-31 MED ORDER — SODIUM CHLORIDE 0.9 % IV SOLN
10.0000 mg | Freq: Once | INTRAVENOUS | Status: AC
  Administered 2021-12-31: 10 mg via INTRAVENOUS
  Filled 2021-12-31: qty 10

## 2021-12-31 MED ORDER — FAMOTIDINE IN NACL 20-0.9 MG/50ML-% IV SOLN
20.0000 mg | Freq: Once | INTRAVENOUS | Status: AC
  Administered 2021-12-31: 20 mg via INTRAVENOUS
  Filled 2021-12-31: qty 50

## 2021-12-31 MED ORDER — TRASTUZUMAB-DKST CHEMO 150 MG IV SOLR
2.0000 mg/kg | Freq: Once | INTRAVENOUS | Status: AC
  Administered 2021-12-31: 105 mg via INTRAVENOUS
  Filled 2021-12-31: qty 5

## 2021-12-31 MED ORDER — ACETAMINOPHEN 325 MG PO TABS
650.0000 mg | ORAL_TABLET | Freq: Once | ORAL | Status: AC
  Administered 2021-12-31: 650 mg via ORAL
  Filled 2021-12-31: qty 2

## 2021-12-31 MED ORDER — HEPARIN SOD (PORK) LOCK FLUSH 100 UNIT/ML IV SOLN: 500.0000 [IU] | Freq: Once | INTRAVENOUS | Status: DC | PRN

## 2021-12-31 MED ORDER — SODIUM CHLORIDE 0.9 % IV SOLN: Freq: Once | INTRAVENOUS | Status: AC

## 2021-12-31 MED ORDER — CETIRIZINE HCL 10 MG/ML IV SOLN
10.0000 mg | Freq: Once | INTRAVENOUS | Status: AC
  Administered 2021-12-31: 10 mg via INTRAVENOUS
  Filled 2021-12-31: qty 1

## 2021-12-31 NOTE — Progress Notes (Signed)
[facility] NOTE  Patient Care Team: Dorothyann Peng, NP as PCP - General (Family Medicine) Rolm Bookbinder, MD as Consulting Physician (General Surgery) Benay Pike, MD as Consulting Physician (Hematology and Oncology) Kyung Rudd, MD as Consulting Physician (Radiation Oncology) Rockwell Germany, RN as Oncology Nurse Navigator Mauro Kaufmann, RN as Oncology Nurse Navigator  CHIEF COMPLAINTS/PURPOSE OF CONSULTATION:  Newly diagnosed breast cancer  HISTORY OF PRESENTING ILLNESS:   Patricia Marshall 76 y.o. female is here because of recent diagnosis of left breast cancer.  I reviewed her records extensively and collaborated the history with the patient.  SUMMARY OF ONCOLOGIC HISTORY: Oncology History  Malignant neoplasm of upper-inner quadrant of left breast in female, estrogen receptor positive ([place])  09/20/2021 Mammogram   Screening mammogram showed extremely dense breasts and a possible mass in the left breast warranting further evaluation.  Breast density category is D Diagnostic mammogram of the breast showed indeterminate mass in the 11:00 location of the left breast.  Indeterminate mass in the lower anterior left axilla.   10/04/2021 Pathology Results   Pathology showed grade 2 invasive ductal carcinoma, left axillary soft tissue needle core biopsy showed benign breast tissue with patchy changes prognostics from the breast tumor showed ER 90% positive strong staining PR 0%, negative, Ki-67 of 30% and HER2 positive for IHC 3+   10/19/2021 Initial Diagnosis   Malignant neoplasm of upper-inner quadrant of left breast in female, estrogen receptor positive ([place])   10/23/2021 Cancer Staging   Staging form: Breast, AJCC 8th Edition - Pathologic: Stage IIA (pT2, pN0, cM0, G3, ER+, PR-, HER2+) - Signed by Benay Pike, MD on 11/26/2021 Histologic grading system: 3 grade system   11/10/2021 Genetic Testing   Negative hereditary cancer genetic testing: no  pathogenic variants detected in [facility] CancerNext-Expanded +RNA Panel.  Report date is November 10, 2021.   The CancerNext-Expanded gene panel offered by [facility] and includes sequencing, rearrangement, and RNA analysis for the following 77 genes: AIP, ALK, APC, ATM, AXIN2, BAP1, BARD1, BLM, BMPR1A, BRCA1, BRCA2, BRIP1, CDC73, CDH1, CDK4, CDKN1B, CDKN2A, CHEK2, CTNNA1, DICER1, FANCC, FH, FLCN, GALNT12, KIF1B, LZTR1, MAX, MEN1, MET, MLH1, MSH2, MSH3, MSH6, MUTYH, NBN, NF1, NF2, NTHL1, PALB2, PHOX2B, PMS2, POT1, PRKAR1A, PTCH1, PTEN, RAD51C, RAD51D, RB1, RECQL, RET, SDHA, SDHAF2, SDHB, SDHC, SDHD, SMAD4, SMARCA4, SMARCB1, SMARCE1, STK11, SUFU, TMEM127, TP53, TSC1, TSC2, VHL and XRCC2 (sequencing and deletion/duplication); EGFR, EGLN1, HOXB13, KIT, MITF, PDGFRA, POLD1, and POLE (sequencing only); EPCAM and GREM1 (deletion/duplication only).    12/10/2021 -  Chemotherapy   Patient is on Treatment Plan : BREAST Paclitaxel + Trastuzumab q7d / Trastuzumab q21d      Interval history  Patricia Marshall is here for follow-up before second weekly cycle of chemotherapy.  Since her last visit, she had a couple days of increased frequency of bowel movements about 3 times a day.   She denies any abdominal pain or urinary habits otherwise.  No neuropathy reported.  No overt hair loss. Rest of the pertinent 10 point ROS reviewed and negative  MEDICAL HISTORY:  Past Medical History:  Diagnosis Date   Arthritis    Breast cancer ([place])    Chicken pox    High cholesterol    PONV (postoperative nausea and vomiting)    Vitamin D deficiency     SURGICAL HISTORY: Past Surgical History:  Procedure Laterality Date   [place]   BREAST LUMPECTOMY WITH AXILLARY LYMPH  NODE BIOPSY Left 11/15/2021   Procedure: LEFT BREAST LUMPECTOMY WITH AXILLARY SENTINEL LYMPH NODE BIOPSY;  Surgeon: Rolm Bookbinder, MD;  Location: [place];  Service: General;  Laterality: Left;    PORTACATH PLACEMENT Right 11/15/2021   Procedure: INSERTION PORT-A-CATH;  Surgeon: Rolm Bookbinder, MD;  Location: [facility];  Service: General;  Laterality: Right;   TONSILLECTOMY AND ADENOIDECTOMY  1953    SOCIAL HISTORY: Social History   Socioeconomic History   Marital status: Married    Spouse name: Not on file   Number of children: Not on file   Years of education: Not on file   Highest education level: Not on file  Occupational History   Not on file  Tobacco Use   Smoking status: Never   Smokeless tobacco: Never  Vaping Use   Vaping Use: Never used  Substance and Sexual Activity   Alcohol use: Yes    Alcohol/week: 1.0 standard drink of alcohol    Types: 1 Glasses of wine per week   Drug use: Never   Sexual activity: Not on file  Other Topics Concern   Not on file  Social History Narrative   Married    Retired    Investment banker, operational of Radio broadcast assistant Strain: [place]  (09/03/2021)   Overall Financial Resource Strain (CARDIA)    Difficulty of Paying Living Expenses: Not hard at all  Food Insecurity: No Food Insecurity (09/03/2021)   Hunger Vital Sign    Worried About Running Out of Food in the Last Year: Never true    [place] in the Last Year: Never true  Transportation Needs: No Transportation Needs (09/03/2021)   PRAPARE - Hydrologist (Medical): No    Lack of Transportation (Non-Medical): No  Physical Activity: Sufficiently Active (09/03/2021)   Exercise Vital Sign    Days of Exercise per Week: 4 days    Minutes of Exercise per Session: 120 min  Stress: No Stress Concern Present (09/03/2021)   [place]    Feeling of Stress : Not at all  Social Connections: [place] (09/03/2021)   Social Connection and Isolation Panel [NHANES]    Frequency of Communication with Friends and Family: More than three times a week     Frequency of Social Gatherings with Friends and Family: More than three times a week    Attends Religious Services: More than 4 times per year    Active Member of [employer] or Organizations: Yes    Attends Music therapist: More than 4 times per year    Marital Status: Married  Human resources officer Violence: Not At Risk (09/03/2021)   Humiliation, Afraid, Rape, and Kick questionnaire    Fear of Current or Ex-Partner: No    Emotionally Abused: No    Physically Abused: No    Sexually Abused: No    FAMILY HISTORY: Family History  Problem Relation Age of Onset   Stomach cancer Mother 68   Heart attack Father 61   Cancer Sister        dx mid 61s; unknown primary w/ mets; treated like ovarian cancer   Heart attack Brother 53   Heart attack Brother 69    ALLERGIES:  is allergic to other, ampicillin, and anesthesia [id number] [propofol].  MEDICATIONS:  Current Outpatient Medications  Medication Sig Dispense Refill   apixaban (ELIQUIS) 5 MG TABS tablet Take 1 tablet (5 mg  total) by mouth 2 (two) times daily. 60 tablet 3   Cholecalciferol (VITAMIN D) 50 MCG (2000 UT) tablet Take 2,000 Units by mouth daily.     lidocaine-prilocaine (EMLA) cream Apply to affected area once 30 g 3   ondansetron (ZOFRAN) 4 MG tablet Take 1 tablet (4 mg total) by mouth daily as needed for nausea or vomiting. (Patient not taking: Reported on 12/25/2021) 10 tablet 0   ondansetron (ZOFRAN) 8 MG tablet Take 1 tablet (8 mg total) by mouth every 8 (eight) hours as needed for nausea or vomiting. (Patient not taking: Reported on 12/25/2021) 30 tablet 1   prochlorperazine (COMPAZINE) 10 MG tablet Take 1 tablet (10 mg total) by mouth every 6 (six) hours as needed for nausea or vomiting. (Patient not taking: Reported on 12/25/2021) 30 tablet 1   traMADol (ULTRAM) 50 MG tablet Take 1 tablet (50 mg total) by mouth every 6 (six) hours as needed. (Patient not taking: Reported on 12/25/2021) 10 tablet 0   No current  facility-administered medications for this visit.   Facility-Administered Medications Ordered in Other Visits  Medication Dose Route Frequency Provider Last Rate Last Admin   dexamethasone (DECADRON) 10 mg in sodium chloride 0.9 % 50 mL IVPB  10 mg Intravenous Once Benay Pike, MD 204 mL/hr at 12/31/21 1253 10 mg at 12/31/21 1253   famotidine (PEPCID) IVPB 20 mg premix  20 mg Intravenous Once Albertine Lafoy, Arletha Pili, MD       heparin lock flush 100 unit/mL  500 Units Intracatheter Once PRN Sachi Boulay, Arletha Pili, MD       PACLitaxel (TAXOL) 126 mg in sodium chloride 0.9 % 250 mL chemo infusion (</= $RemoveBefor'80mg'zcqxLlMKADTG$ /m2)  80 mg/m2 (Treatment Plan Recorded) Intravenous Once Sony Schlarb, MD       sodium chloride flush (NS) 0.9 % injection 10 mL  10 mL Intracatheter PRN Roda Lauture, MD       trastuzumab-dkst (OGIVRI) 105 mg in sodium chloride 0.9 % 250 mL chemo infusion  2 mg/kg (Treatment Plan Recorded) Intravenous Once Cella Cappello, Arletha Pili, MD        REVIEW OF SYSTEMS:   Constitutional: Denies fevers, chills or abnormal night sweats Eyes: Denies blurriness of vision, double vision or watery eyes Ears, nose, mouth, throat, and face: Denies mucositis or sore throat Respiratory: Denies cough, dyspnea or wheezes Cardiovascular: Denies palpitation, chest discomfort or lower extremity swelling Gastrointestinal:  Denies nausea, heartburn or change in bowel habits Skin: Denies abnormal skin rashes Lymphatics: Denies new lymphadenopathy or easy bruising Neurological:Denies numbness, tingling or new weaknesses Behavioral/Psych: Mood is stable, no new changes  Breast: Denies any palpable lumps or discharge All other systems were reviewed with the patient and are negative.  PHYSICAL EXAMINATION: ECOG PERFORMANCE STATUS: 0 - Asymptomatic  Vitals:   12/31/21 1156  BP: (!) 150/73  Pulse: 64  Resp: 16  Temp: 97.8 F (36.6 C)  SpO2: 100%    Filed Weights   12/31/21 1156  Weight: 116 lb 6.4 oz (52.8 kg)   Physical  Exam Constitutional:      Appearance: Normal appearance. She is normal weight.  Cardiovascular:     Rate and Rhythm: Normal rate and regular rhythm.     Pulses: Normal pulses.     Heart sounds: Normal heart sounds.  Pulmonary:     Effort: Pulmonary effort is normal.     Breath sounds: Normal breath sounds.  Musculoskeletal:        General: No swelling or tenderness.     Cervical back:  Normal range of motion and neck supple. No rigidity.  Lymphadenopathy:     Cervical: No cervical adenopathy.  Skin:    General: Skin is warm and dry.  Neurological:     General: No focal deficit present.     Mental Status: She is alert.  Psychiatric:        Mood and Affect: Mood normal.    LABORATORY DATA:  I have reviewed the data as listed Lab Results  Component Value Date   WBC 4.0 12/31/2021   HGB 11.7 (L) 12/31/2021   HCT 33.6 (L) 12/31/2021   MCV 87.3 12/31/2021   PLT 183 12/31/2021   Lab Results  Component Value Date   NA 136 12/31/2021   K 4.3 12/31/2021   CL 105 12/31/2021   CO2 28 12/31/2021    RADIOGRAPHIC STUDIES: I have personally reviewed the radiological reports and agreed with the findings in the report.  ASSESSMENT AND PLAN:  Malignant neoplasm of upper-inner quadrant of left breast in female, estrogen receptor positive ([place]) This is a very pleasant 76 year old postmenopausal female patient with newly diagnosed left breast upper inner quadrant invasive ductal carcinoma, grade 2, ER +90% strong staining, PR 0% negative, HER2 positive by IHC 3+, Ki-67 of 30% referred to breast [place] for additional recommendations.   She is tolerating adjuvant chemotherapy very well.  No dose-limiting toxicity.  She has mild grade 1 diarrhea, okay to use Imodium as needed. No evidence of neuropathy. We will proceed with chemotherapy as planned.  After 12 weekly cycles, she will continue adjuvant Herceptin, adjuvant radiation followed by antiestrogen therapy. She will continue  anticoagulation for 3 to 6 months.  They had a few questions regarding the duration of anticoagulation, role of repeat imaging etc.  We do not routinely do follow-up imaging for DVT and we discussed the reasoning behind this.  No overt lower extremity swelling today. Return to clinic for follow-up every other week, weekly infusion. Thank you for consulting [place] the care of this patient.  Please not hesitate contact us with any additional questions or concerns.   Total time spent: 30 minutes including history, physical exam, review of records, counseling and coordination of care All questions were answered. The patient knows to call the clinic with any problems, questions or concerns.    Benay Pike, MD 12/31/21

## 2021-12-31 NOTE — Assessment & Plan Note (Signed)
This is a very pleasant 76 year old postmenopausal female patient with newly diagnosed left breast upper inner quadrant invasive ductal carcinoma, grade 2, ER +90% strong staining, PR 0% negative, HER2 positive by IHC 3+, Ki-67 of 30% referred to breast [place] for additional recommendations.   She is tolerating adjuvant chemotherapy very well.  No dose-limiting toxicity.  She has mild grade 1 diarrhea, okay to use Imodium as needed. No evidence of neuropathy. We will proceed with chemotherapy as planned.  After 12 weekly cycles, she will continue adjuvant Herceptin, adjuvant radiation followed by antiestrogen therapy. She will continue anticoagulation for 3 to 6 months.  They had a few questions regarding the duration of anticoagulation, role of repeat imaging etc.  We do not routinely do follow-up imaging for DVT and we discussed the reasoning behind this.  No overt lower extremity swelling today. Return to clinic for follow-up every other week, weekly infusion. Thank you for consulting [place] the care of this patient.  Please not hesitate contact us with any additional questions or concerns.

## 2021-12-31 NOTE — Patient Instructions (Signed)
[facility] ONCOLOG[facility]   Discharge Instructions: Thank you for choosing [facility] to provide your oncology and hematology care.   If you have a lab appointment with the [facility], please go directly to the [facility] and check in at the registration area.   Wear comfortable clothing and clothing appropriate for easy access to any Portacath or PICC line.   We strive to give you quality time with your provider. You may need to reschedule your appointment if you arrive late (15 or more minutes).  Arriving late affects you and other patients whose appointments are after yours.  Also, if you miss three or more appointments without notifying the office, you may be dismissed from the clinic at the provider's discretion.      For prescription refill requests, have your pharmacy contact our office and allow 72 hours for refills to be completed.    Today you received the following chemotherapy and/or immunotherapy agents: trastuzumab and paclitaxel      To help prevent nausea and vomiting after your treatment, we encourage you to take your nausea medication as directed.  BELOW ARE SYMPTOMS THAT SHOULD BE REPORTED IMMEDIATELY: *FEVER GREATER THAN 100.4 F (38 C) OR HIGHER *CHILLS OR SWEATING *NAUSEA AND VOMITING THAT IS NOT CONTROLLED WITH YOUR NAUSEA MEDICATION *UNUSUAL SHORTNESS OF BREATH *UNUSUAL BRUISING OR BLEEDING *URINARY PROBLEMS (pain or burning when urinating, or frequent urination) *BOWEL PROBLEMS (unusual diarrhea, constipation, pain near the anus) TENDERNESS IN MOUTH AND THROAT WITH OR WITHOUT PRESENCE OF ULCERS (sore throat, sores in mouth, or a toothache) UNUSUAL RASH, SWELLING OR PAIN  UNUSUAL VAGINAL DISCHARGE OR ITCHING   Items with * indicate a potential emergency and should be followed up as soon as possible or go to the Emergency Department if any problems should occur.  Please show the CHEMOTHERAPY ALERT CARD or IMMUNOTHERAPY ALERT  CARD at check-in to the Emergency Department and triage nurse.  Should you have questions after your visit or need to cancel or reschedule your appointment, please contact [facility]  Dept: [phone]  and follow the prompts.  Office hours are 8:00 a.m. to 4:30 p.m. Monday - Friday. Please note that voicemails left after 4:00 p.m. may not be returned until the following business day.  We are closed weekends and major holidays. You have access to a nurse at all times for urgent questions. Please call the main number to the clinic Dept: [phone] and follow the prompts.   For any non-urgent questions, you may also contact your provider using MyChart. We now offer e-Visits for anyone 72 and older to request care online for non-urgent symptoms. For details visit [email or web address].   Also download the MyChart app! Go to the app store, search "MyChart", open the app, select [facility], and log in with your MyChart username and password.  Masks are optional in the cancer centers. If you would like for your care team to wear a mask while they are taking care of you, please let them know. You may have one support person who is at least 75 years old accompany you for your appointments.

## 2022-01-02 NOTE — Therapy (Signed)
OUTPATIENT PHYSICAL THERAPY BREAST CANCER POST OP FOLLOW UP   Patient Name: Patricia Marshall MRN: [id number] DOB:[birth year], 76 y.o., female Today's Date: 01/03/2022   PT End of Session - 01/03/22 1101     Visit Number 3    Number of Visits 3    Date for PT Re-Evaluation 01/03/22    PT Start Time 1103    PT Stop Time 1138    PT Time Calculation (min) 35 min    Activity Tolerance Patient tolerated treatment well    Behavior During Therapy WFL for tasks assessed/performed             Past Medical History:  Diagnosis Date   Arthritis    Breast cancer ([place])    Chicken pox    High cholesterol    PONV (postoperative nausea and vomiting)    Vitamin D deficiency    Past Surgical History:  Procedure Laterality Date   [place] LUMPECTOMY WITH AXILLARY LYMPH NODE BIOPSY Left 11/15/2021   Procedure: LEFT BREAST LUMPECTOMY WITH AXILLARY SENTINEL LYMPH NODE BIOPSY;  Surgeon: Rolm Bookbinder, MD;  Location: [facility];  Service: General;  Laterality: Left;   PORTACATH PLACEMENT Right 11/15/2021   Procedure: INSERTION PORT-A-CATH;  Surgeon: Rolm Bookbinder, MD;  Location: [facility];  Service: General;  Laterality: Right;   Power   Patient Active Problem List   Diagnosis Date Noted   Genetic testing 12/07/2021   Breast cancer ([place]) 11/15/2021   Malignant neoplasm of upper-inner quadrant of left breast in female, estrogen receptor positive ([place]) 10/19/2021   Piriformis syndrome of left side 07/12/2015   Degenerative arthritis of left knee 07/12/2015   BEE STING REACTION, LOCAL 10/24/2006    PCP: Dorothyann Peng NP  REFERRING PROVIDER: Dr. Rolm Bookbinder  REFERRING DIAG: Left Breast Cancer  THERAPY DIAG:  Malignant neoplasm of upper-inner quadrant of left breast in female, estrogen receptor positive ([place])  Abnormal posture  Aftercare following surgery for  neoplasm  Rationale for Evaluation and Treatment Rehabilitation  ONSET DATE: 11/15/2021  SUBJECTIVE:                                                                                                                                                                                           SUBJECTIVE STATEMENT: I think I am doing well.I am hesitant to play pickle ball because of my port. Have had 4 chemos so far but side effects are manageable.  Sometimes in the morning my left breast is a little tender. I have been  doing the scar massage and it feels better. The MD told me I didn't need to get a stocking for my left leg so I didn't. My left ankle swells a little, but it goes down overnight.  PERTINENT HISTORY:  Patient was diagnosed on 09/20/2021 with left grade 2 invasive ductal carcinoma breast cancer. She underwent a left lumpectomy and sentinel node biopsy (2 negative nodes) on 11/15/2021. It is ER positive, PR negative, and HER2 positive with a Ki67 of 30%  PATIENT GOALS:  Reassess how my recovery is going related to arm function, pain, and swelling.  PAIN:  Are you having pain? No  PRECAUTIONS: Recent Surgery, left UE Lymphedema risk,  DVT (11/19/2021)  ACTIVITY LEVEL / LEISURE: I have been walking some, not returned to pickleball,   OBJECTIVE:   PATIENT SURVEYS:  QUICK DASH: 0, 2 not answered NA  OBSERVATIONS:  Incisions healed. No tenderness, no visible swelling. Iron Oxide still noted around nipple area  POSTURE:  Forward head rounded shoulders  LYMPHEDEMA ASSESSMENT:    UPPER EXTREMITY AROM/PROM:   A/PROM RIGHT   eval    Shoulder extension 43  Shoulder flexion 146  Shoulder abduction 160  Shoulder internal rotation 70  Shoulder external rotation 78                          (Blank rows = not tested)   A/PROM LEFT   eval LEFT 12/03/2021 Left 01/03/2022  Shoulder extension 49 57 57  Shoulder flexion 130 123 149  Shoulder abduction 148 129 160  Shoulder internal  rotation 68 67 68  Shoulder external rotation 70 79 90                          (Blank rows = not tested)     CERVICAL AROM: All within normal limits   UPPER EXTREMITY STRENGTH: WNL     LYMPHEDEMA ASSESSMENTS:    LANDMARK RIGHT   eval RIGHT 12/03/2021  10 cm proximal to olecranon process 24 23.2  Olecranon process 22.4 21.8  10 cm proximal to ulnar styloid process 20.8 19.4  Just proximal to ulnar styloid process 14.3 14.3  Across hand at thumb web space 17.4 17.6  At base of 2nd digit 5.8 5.9  (Blank rows = not tested)   LANDMARK LEFT   eval LEFT 12/03/2021 LEFT 01/03/2022  10 cm proximal to olecranon process 22.8 21.9 21.7  Olecranon process 21.5 21.4 21.3  10 cm proximal to ulnar styloid process 19.3 18.9 18.9  Just proximal to ulnar styloid process 14.2 14.1 14.1  Across hand at thumb web space 17.4 17.8 17.7  At base of 2nd digit 5.9 5.8 5.8  (Blank rows = not tested)    Surgery type/Date: Left lumpectomy with SLNB 11/15/2021 Number of lymph nodes removed: 0/2 Current/past treatment (chemo, radiation, hormone therapy): presently having chemo and following will have radiation. Infusions of Herceptin for a year Other symptoms:  Heaviness/tightness No Pain No Pitting edema YesRight ankle Infections No Decreased scar mobility No Stemmer sign No   PATIENT EDUCATION:  Education details: SOZO, continue exercises during radiation, continue scar massage Person educated: Patient Education method: Explanation and Demonstration Education comprehension: verbalized understanding   HOME EXERCISE PROGRAM:  Verbally Reviewed previously given post op HEP. Advised pt to continue during and after radiation  ASSESSMENT:  CLINICAL IMPRESSION: Pt is doing very well overall. She has restored her ROM and exceeded most  of her baseline measurements. There is no difference in arm circumference and no concerns for lymphedema. Her incision is well healed and there is no visible  swelling. She continues with her infusions and will have radiation at completion of chemotherapy. There are no further PT needs identified at this time. She was advised to message or call with any concerns. She is discharged.  Pt will benefit from skilled therapeutic intervention to improve on the following deficits: Decreased knowledge of precautions, impaired UE functional use, pain, decreased ROM, postural dysfunction.   PT treatment/interventions: ADL/Self care home management, Therapeutic exercises, Patient/Family education, and Self Care     GOALS: Goals reviewed with patient? Yes  LONG TERM GOALS:  (STG=LTG)  GOALS Name Target Date  Goal status  1 Pt will demonstrate she has regained full shoulder ROM and function post operatively compared to baselines.  Baseline: 01/03/2022 MET  $Re'2     3     4        'LHx$ PLAN: PT FREQUENCY/DURATION: No further needs identified  PLAN FOR NEXT SESSION: Pt is D/C to independent home management   [facility]  [address]  [place] [address]  [phone]  After Breast Cancer Class It is recommended you attend the ABC class to be educated on lymphedema risk reduction. This class is free of charge and lasts for 1 hour. It is a 1-time class. You will need to download the Webex app either on your phone or computer. We will send you a link the night before or the morning of the class. You should be able to click on that link to join the class. This is not a confidential class. You don't have to turn your camera on, but other participants may be able to see your email address.  Scar massage You can begin gentle scar massage to you incision sites. Gently place one hand on the incision and move the skin (without sliding on the skin) in various directions. Do this for a few minutes and then you can gently massage either coconut oil or vitamin E cream into the scars.  Compression garment You should continue wearing  your compression bra until you feel like you no longer have swelling.  Home exercise Program Continue doing the exercises you were given until you feel like you can do them without feeling any tightness at the end.   Walking Program Studies show that 30 minutes of walking per day (fast enough to elevate your heart rate) can significantly reduce the risk of a cancer recurrence. If you can't walk due to other medical reasons, we encourage you to find another activity you could do (like a stationary bike or water exercise).  Posture After breast cancer surgery, people frequently sit with rounded shoulders posture because it puts their incisions on slack and feels better. If you sit like this and scar tissue forms in that position, you can become very tight and have pain sitting or standing with good posture. Try to be aware of your posture and sit and stand up tall to heal properly.  Follow up PT: It is recommended you return every 3 months for the first 3 years following surgery to be assessed on the SOZO machine for an L-Dex score. This helps prevent clinically significant lymphedema in 95% of patients. These follow up screens are 10 minute appointments that you are not billed for.  Claris Pong, PT 01/03/2022, 11:49 AM

## 2022-01-03 ENCOUNTER — Encounter: Payer: Self-pay | Admitting: *Deleted

## 2022-01-03 ENCOUNTER — Ambulatory Visit: Payer: Medicare HMO | Attending: General Surgery

## 2022-01-03 DIAGNOSIS — Z170 Estrogen receptor positive status [ER+]: Secondary | ICD-10-CM | POA: Insufficient documentation

## 2022-01-03 DIAGNOSIS — R293 Abnormal posture: Secondary | ICD-10-CM | POA: Diagnosis not present

## 2022-01-03 DIAGNOSIS — Z483 Aftercare following surgery for neoplasm: Secondary | ICD-10-CM | POA: Diagnosis not present

## 2022-01-03 DIAGNOSIS — C50212 Malignant neoplasm of upper-inner quadrant of left female breast: Secondary | ICD-10-CM | POA: Diagnosis not present

## 2022-01-04 MED FILL — Dexamethasone Sodium Phosphate Inj 100 MG/10ML: INTRAMUSCULAR | Qty: 1 | Status: AC

## 2022-01-06 NOTE — Progress Notes (Unsigned)
[facility] Follow up:    Patricia Peng, NP [place] [address]   DIAGNOSIS: Cancer Staging  Malignant neoplasm of upper-inner quadrant of left breast in female, estrogen receptor positive ([place]) Staging form: Breast, AJCC 8th Edition - Pathologic: Stage IIA (pT2, pN0, cM0, G3, ER+, PR-, HER2+) - Signed by Benay Pike, MD on 11/26/2021 Histologic grading system: 3 grade system   SUMMARY OF ONCOLOGIC HISTORY: Oncology History  Malignant neoplasm of upper-inner quadrant of left breast in female, estrogen receptor positive ([place])  09/20/2021 Mammogram   Screening mammogram showed extremely dense breasts and a possible mass in the left breast warranting further evaluation.  Breast density category is D Diagnostic mammogram of the breast showed indeterminate mass in the 11:00 location of the left breast.  Indeterminate mass in the lower anterior left axilla.   10/04/2021 Pathology Results   Pathology showed grade 2 invasive ductal carcinoma, left axillary soft tissue needle core biopsy showed benign breast tissue with patchy changes prognostics from the breast tumor showed ER 90% positive strong staining PR 0%, negative, Ki-67 of 30% and HER2 positive for IHC 3+   10/19/2021 Initial Diagnosis   Malignant neoplasm of upper-inner quadrant of left breast in female, estrogen receptor positive ([place])   10/23/2021 Cancer Staging   Staging form: Breast, AJCC 8th Edition - Pathologic: Stage IIA (pT2, pN0, cM0, G3, ER+, PR-, HER2+) - Signed by Benay Pike, MD on 11/26/2021 Histologic grading system: 3 grade system   11/10/2021 Genetic Testing   Negative hereditary cancer genetic testing: no pathogenic variants detected in [facility] CancerNext-Expanded +RNA Panel.  Report date is November 10, 2021.   The CancerNext-Expanded gene panel offered by [facility] and includes sequencing, rearrangement, and RNA analysis for the following 77 genes: AIP, ALK, APC,  ATM, AXIN2, BAP1, BARD1, BLM, BMPR1A, BRCA1, BRCA2, BRIP1, CDC73, CDH1, CDK4, CDKN1B, CDKN2A, CHEK2, CTNNA1, DICER1, FANCC, FH, FLCN, GALNT12, KIF1B, LZTR1, MAX, MEN1, MET, MLH1, MSH2, MSH3, MSH6, MUTYH, NBN, NF1, NF2, NTHL1, PALB2, PHOX2B, PMS2, POT1, PRKAR1A, PTCH1, PTEN, RAD51C, RAD51D, RB1, RECQL, RET, SDHA, SDHAF2, SDHB, SDHC, SDHD, SMAD4, SMARCA4, SMARCB1, SMARCE1, STK11, SUFU, TMEM127, TP53, TSC1, TSC2, VHL and XRCC2 (sequencing and deletion/duplication); EGFR, EGLN1, HOXB13, KIT, MITF, PDGFRA, POLD1, and POLE (sequencing only); EPCAM and GREM1 (deletion/duplication only).    12/10/2021 -  Chemotherapy   Patient is on Treatment Plan : BREAST Paclitaxel + Trastuzumab q7d / Trastuzumab q21d       CURRENT THERAPY: Taxol/Herceptin  INTERVAL HISTORY: Patricia Marshall 76 y.o. female returns for f/u and evaluation prior to receiving Taxol/Herceptin  Her most recent echo was completed on 10/31/2021 and showed a LVEF of 60-65%.     Patient Active Problem List   Diagnosis Date Noted   Genetic testing 12/07/2021   Breast cancer ([place]) 11/15/2021   Malignant neoplasm of upper-inner quadrant of left breast in female, estrogen receptor positive ([place]) 10/19/2021   Piriformis syndrome of left side 07/12/2015   Degenerative arthritis of left knee 07/12/2015   BEE STING REACTION, LOCAL 10/24/2006    is allergic to other, ampicillin, and anesthesia [id number] [propofol].  MEDICAL HISTORY: Past Medical History:  Diagnosis Date   Arthritis    Breast cancer ([place])    Chicken pox    High cholesterol    PONV (postoperative nausea and vomiting)    Vitamin D deficiency     SURGICAL HISTORY: Past Surgical History:  Procedure Laterality Date   APPENDECTOMY  1959   BACK SURGERY  1996   BREAST LUMPECTOMY WITH AXILLARY LYMPH NODE BIOPSY Left 11/15/2021   Procedure: LEFT BREAST LUMPECTOMY WITH AXILLARY SENTINEL LYMPH NODE BIOPSY;  Surgeon: Rolm Bookbinder, MD;  Location: [facility];  Service: General;  Laterality: Left;   PORTACATH PLACEMENT Right 11/15/2021   Procedure: INSERTION PORT-A-CATH;  Surgeon: Rolm Bookbinder, MD;  Location: [facility];  Service: General;  Laterality: Right;   TONSILLECTOMY AND ADENOIDECTOMY  1954    SOCIAL HISTORY: Social History   Socioeconomic History   Marital status: Married    Spouse name: Not on file   Number of children: Not on file   Years of education: Not on file   Highest education level: Not on file  Occupational History   Not on file  Tobacco Use   Smoking status: Never   Smokeless tobacco: Never  Vaping Use   Vaping Use: Never used  Substance and Sexual Activity   Alcohol use: Yes    Alcohol/week: 1.0 standard drink of alcohol    Types: 1 Glasses of wine per week   Drug use: Never   Sexual activity: Not on file  Other Topics Concern   Not on file  Social History Narrative   Married    Retired    Investment banker, operational of Radio broadcast assistant Strain: [place]  (09/03/2021)   Overall Financial Resource Strain (CARDIA)    Difficulty of Paying Living Expenses: Not hard at all  Food Insecurity: No Food Insecurity (09/03/2021)   Hunger Vital Sign    Worried About Running Out of Food in the Last Year: Never true    [place] in the Last Year: Never true  Transportation Needs: No Transportation Needs (09/03/2021)   PRAPARE - Hydrologist (Medical): No    Lack of Transportation (Non-Medical): No  Physical Activity: Sufficiently Active (09/03/2021)   Exercise Vital Sign    Days of Exercise per Week: 4 days    Minutes of Exercise per Session: 120 min  Stress: No Stress Concern Present (09/03/2021)   [place]    Feeling of Stress : Not at all  Social Connections: [place] (09/03/2021)   Social Connection and Isolation Panel [NHANES]    Frequency of Communication with Friends  and Family: More than three times a week    Frequency of Social Gatherings with Friends and Family: More than three times a week    Attends Religious Services: More than 4 times per year    Active Member of [employer] or Organizations: Yes    Attends Music therapist: More than 4 times per year    Marital Status: Married  Human resources officer Violence: Not At Risk (09/03/2021)   Humiliation, Afraid, Rape, and Kick questionnaire    Fear of Current or Ex-Partner: No    Emotionally Abused: No    Physically Abused: No    Sexually Abused: No    FAMILY HISTORY: Family History  Problem Relation Age of Onset   Stomach cancer Mother 73   Heart attack Father 45   Cancer Sister        dx mid 39s; unknown primary w/ mets; treated like ovarian cancer   Heart attack Brother 51   Heart attack Brother 3    Review of Systems  Constitutional:  Negative for appetite change, chills, fatigue, fever and unexpected weight change.  HENT:   Negative for hearing loss, lump/mass and  trouble swallowing.   Eyes:  Negative for eye problems and icterus.  Respiratory:  Negative for chest tightness, cough and shortness of breath.   Cardiovascular:  Negative for chest pain, leg swelling and palpitations.  Gastrointestinal:  Negative for abdominal distention, abdominal pain, constipation, diarrhea, nausea and vomiting.  Endocrine: Negative for hot flashes.  Genitourinary:  Negative for difficulty urinating.   Musculoskeletal:  Negative for arthralgias.  Skin:  Negative for itching and rash.  Neurological:  Negative for dizziness, extremity weakness, headaches and numbness.  Hematological:  Negative for adenopathy. Does not bruise/bleed easily.  Psychiatric/Behavioral:  Negative for depression. The patient is not nervous/anxious.       PHYSICAL EXAMINATION  ECOG PERFORMANCE STATUS: 1 - Symptomatic but completely ambulatory  There were no vitals filed for this visit.  Physical Exam Constitutional:       General: She is not in acute distress.    Appearance: Normal appearance. She is not toxic-appearing.  HENT:     Head: Normocephalic and atraumatic.  Eyes:     General: No scleral icterus. Cardiovascular:     Rate and Rhythm: Normal rate and regular rhythm.     Pulses: Normal pulses.     Heart sounds: Normal heart sounds.  Pulmonary:     Effort: Pulmonary effort is normal.     Breath sounds: Normal breath sounds.  Abdominal:     General: Abdomen is flat. Bowel sounds are normal. There is no distension.     Palpations: Abdomen is soft.     Tenderness: There is no abdominal tenderness.  Musculoskeletal:        General: No swelling.     Cervical back: Neck supple.  Lymphadenopathy:     Cervical: No cervical adenopathy.  Skin:    General: Skin is warm and dry.     Findings: No rash.  Neurological:     General: No focal deficit present.     Mental Status: She is alert.  Psychiatric:        Mood and Affect: Mood normal.        Behavior: Behavior normal.     LABORATORY DATA:  CBC    Component Value Date/Time   WBC 4.0 12/31/2021 1121   WBC 8.7 11/19/2021 1645   RBC 3.85 (L) 12/31/2021 1121   HGB 11.7 (L) 12/31/2021 1121   HCT 33.6 (L) 12/31/2021 1121   PLT 183 12/31/2021 1121   MCV 87.3 12/31/2021 1121   MCH 30.4 12/31/2021 1121   MCHC 34.8 12/31/2021 1121   RDW 12.9 12/31/2021 1121   LYMPHSABS 1.5 12/31/2021 1121   MONOABS 0.2 12/31/2021 1121   EOSABS 0.0 12/31/2021 1121   BASOSABS 0.1 12/31/2021 1121    CMP     Component Value Date/Time   NA 136 12/31/2021 1121   K 4.3 12/31/2021 1121   CL 105 12/31/2021 1121   CO2 28 12/31/2021 1121   GLUCOSE 101 (H) 12/31/2021 1121   BUN 21 12/31/2021 1121   CREATININE 0.60 12/31/2021 1121   CREATININE 0.76 11/03/2019 0921   CALCIUM 9.4 12/31/2021 1121   PROT 6.7 12/31/2021 1121   ALBUMIN 4.2 12/31/2021 1121   AST 19 12/31/2021 1121   ALT 20 12/31/2021 1121   ALKPHOS 69 12/31/2021 1121   BILITOT 0.6 12/31/2021  1121   GFRNONAA >60 12/31/2021 1121   GFRNONAA 78 11/03/2019 0921   GFRAA 91 11/03/2019 0921       ASSESSMENT and THERAPY PLAN:   No problem-specific Assessment & Plan  notes found for this encounter.     All questions were answered. The patient knows to call the clinic with any problems, questions or concerns. We can certainly see the patient much sooner if necessary.  Total encounter time:*** minutes*in face-to-face visit time, chart review, lab review, care coordination, order entry, and documentation of the encounter time.    Wilber Bihari, NP 01/06/22 3:59 PM Medical Oncology and Hematology [facility] [place], [place] [address] Tel. [phone]    Fax. [phone]  *Total Encounter Time as defined by the Centers for Medicare and Medicaid Services includes, in addition to the face-to-face time of a patient visit (documented in the note above) non-face-to-face time: obtaining and reviewing outside history, ordering and reviewing medications, tests or procedures, care coordination (communications with other health care professionals or caregivers) and documentation in the medical record.

## 2022-01-07 ENCOUNTER — Encounter: Payer: Self-pay | Admitting: Adult Health

## 2022-01-07 ENCOUNTER — Inpatient Hospital Stay: Payer: Medicare HMO

## 2022-01-07 ENCOUNTER — Inpatient Hospital Stay (HOSPITAL_BASED_OUTPATIENT_CLINIC_OR_DEPARTMENT_OTHER): Payer: Medicare HMO | Admitting: Adult Health

## 2022-01-07 VITALS — BP 132/60 | HR 70 | Temp 98.8°F | Resp 18

## 2022-01-07 VITALS — BP 153/73 | HR 67 | Temp 97.8°F | Resp 16 | Ht 65.0 in | Wt 118.0 lb

## 2022-01-07 DIAGNOSIS — Z5111 Encounter for antineoplastic chemotherapy: Secondary | ICD-10-CM | POA: Diagnosis not present

## 2022-01-07 DIAGNOSIS — Z170 Estrogen receptor positive status [ER+]: Secondary | ICD-10-CM | POA: Diagnosis not present

## 2022-01-07 DIAGNOSIS — C50212 Malignant neoplasm of upper-inner quadrant of left female breast: Secondary | ICD-10-CM | POA: Diagnosis not present

## 2022-01-07 DIAGNOSIS — Z5112 Encounter for antineoplastic immunotherapy: Secondary | ICD-10-CM | POA: Diagnosis not present

## 2022-01-07 LAB — CBC WITH DIFFERENTIAL (CANCER CENTER ONLY)
Abs Immature Granulocytes: 0.03 10*3/uL (ref 0.00–0.07)
Basophils Absolute: 0.1 10*3/uL (ref 0.0–0.1)
Basophils Relative: 1 %
Eosinophils Absolute: 0.1 10*3/uL (ref 0.0–0.5)
Eosinophils Relative: 2 %
HCT: 32.7 % — ABNORMAL LOW (ref 36.0–46.0)
Hemoglobin: 11.3 g/dL — ABNORMAL LOW (ref 12.0–15.0)
Immature Granulocytes: 1 %
Lymphocytes Relative: 38 %
Lymphs Abs: 1.6 10*3/uL (ref 0.7–4.0)
MCH: 30.3 pg (ref 26.0–34.0)
MCHC: 34.6 g/dL (ref 30.0–36.0)
MCV: 87.7 fL (ref 80.0–100.0)
Monocytes Absolute: 0.4 10*3/uL (ref 0.1–1.0)
Monocytes Relative: 8 %
Neutro Abs: 2.1 10*3/uL (ref 1.7–7.7)
Neutrophils Relative %: 50 %
Platelet Count: 178 10*3/uL (ref 150–400)
RBC: 3.73 MIL/uL — ABNORMAL LOW (ref 3.87–5.11)
RDW: 13.3 % (ref 11.5–15.5)
WBC Count: 4.3 10*3/uL (ref 4.0–10.5)
nRBC: 0 % (ref 0.0–0.2)

## 2022-01-07 LAB — CMP (CANCER CENTER ONLY)
ALT: 21 U/L (ref 0–44)
AST: 19 U/L (ref 15–41)
Albumin: 4.1 g/dL (ref 3.5–5.0)
Alkaline Phosphatase: 64 U/L (ref 38–126)
Anion gap: 4 — ABNORMAL LOW (ref 5–15)
BUN: 19 mg/dL (ref 8–23)
CO2: 28 mmol/L (ref 22–32)
Calcium: 9.4 mg/dL (ref 8.9–10.3)
Chloride: 104 mmol/L (ref 98–111)
Creatinine: 0.58 mg/dL (ref 0.44–1.00)
GFR, Estimated: >60 mL/min (ref >60)
Glucose, Bld: 92 mg/dL (ref 70–99)
Potassium: 4.3 mmol/L (ref 3.5–5.1)
Sodium: 136 mmol/L (ref 135–145)
Total Bilirubin: 0.5 mg/dL (ref 0.3–1.2)
Total Protein: 6.7 g/dL (ref 6.5–8.1)

## 2022-01-07 MED ORDER — TRASTUZUMAB-DKST CHEMO 150 MG IV SOLR
2.0000 mg/kg | Freq: Once | INTRAVENOUS | Status: AC
  Administered 2022-01-07: 105 mg via INTRAVENOUS
  Filled 2022-01-07: qty 5

## 2022-01-07 MED ORDER — SODIUM CHLORIDE 0.9 % IV SOLN: Freq: Once | INTRAVENOUS | Status: AC

## 2022-01-07 MED ORDER — ACETAMINOPHEN 325 MG PO TABS
650.0000 mg | ORAL_TABLET | Freq: Once | ORAL | Status: AC
  Administered 2022-01-07: 650 mg via ORAL
  Filled 2022-01-07: qty 2

## 2022-01-07 MED ORDER — HEPARIN SOD (PORK) LOCK FLUSH 100 UNIT/ML IV SOLN
500.0000 [IU] | Freq: Once | INTRAVENOUS | Status: AC | PRN
  Administered 2022-01-07: 500 [IU]

## 2022-01-07 MED ORDER — SODIUM CHLORIDE 0.9% FLUSH
10.0000 mL | INTRAVENOUS | Status: DC | PRN
  Administered 2022-01-07: 10 mL

## 2022-01-07 MED ORDER — CETIRIZINE HCL 10 MG/ML IV SOLN
10.0000 mg | Freq: Once | INTRAVENOUS | Status: AC
  Administered 2022-01-07: 10 mg via INTRAVENOUS
  Filled 2022-01-07: qty 1

## 2022-01-07 MED ORDER — SODIUM CHLORIDE 0.9% FLUSH
10.0000 mL | INTRAVENOUS | Status: DC | PRN
  Administered 2022-01-07: 10 mL via INTRAVENOUS

## 2022-01-07 MED ORDER — FAMOTIDINE IN NACL 20-0.9 MG/50ML-% IV SOLN
20.0000 mg | Freq: Once | INTRAVENOUS | Status: AC
  Administered 2022-01-07: 20 mg via INTRAVENOUS
  Filled 2022-01-07: qty 50

## 2022-01-07 MED ORDER — SODIUM CHLORIDE 0.9 % IV SOLN
10.0000 mg | Freq: Once | INTRAVENOUS | Status: AC
  Administered 2022-01-07: 10 mg via INTRAVENOUS
  Filled 2022-01-07: qty 10

## 2022-01-07 MED ORDER — SODIUM CHLORIDE 0.9 % IV SOLN
80.0000 mg/m2 | Freq: Once | INTRAVENOUS | Status: AC
  Administered 2022-01-07: 126 mg via INTRAVENOUS
  Filled 2022-01-07: qty 21

## 2022-01-07 NOTE — Patient Instructions (Signed)
[facility]  Discharge Instructions: Thank you for choosing [facility] to provide your oncology and hematology care.   If you have a lab appointment with the Cancer Center, please go directly to the Cancer Center and check in at the registration area.   Wear comfortable clothing and clothing appropriate for easy access to any Portacath or PICC line.   We strive to give you quality time with your provider. You may need to reschedule your appointment if you arrive late (15 or more minutes).  Arriving late affects you and other patients whose appointments are after yours.  Also, if you miss three or more appointments without notifying the office, you may be dismissed from the clinic at the provider's discretion.      For prescription refill requests, have your pharmacy contact our office and allow 72 hours for refills to be completed.    Today you received the following chemotherapy and/or immunotherapy agents: Trastuzumab (Ogivri) and Paclitaxel (Taxol)   To help prevent nausea and vomiting after your treatment, we encourage you to take your nausea medication as directed.  BELOW ARE SYMPTOMS THAT SHOULD BE REPORTED IMMEDIATELY: *FEVER GREATER THAN 100.4 F (38 C) OR HIGHER *CHILLS OR SWEATING *NAUSEA AND VOMITING THAT IS NOT CONTROLLED WITH YOUR NAUSEA MEDICATION *UNUSUAL SHORTNESS OF BREATH *UNUSUAL BRUISING OR BLEEDING *URINARY PROBLEMS (pain or burning when urinating, or frequent urination) *BOWEL PROBLEMS (unusual diarrhea, constipation, pain near the anus) TENDERNESS IN MOUTH AND THROAT WITH OR WITHOUT PRESENCE OF ULCERS (sore throat, sores in mouth, or a toothache) UNUSUAL RASH, SWELLING OR PAIN  UNUSUAL VAGINAL DISCHARGE OR ITCHING   Items with * indicate a potential emergency and should be followed up as soon as possible or go to the Emergency Department if any problems should occur.  Please show the CHEMOTHERAPY ALERT CARD or  IMMUNOTHERAPY ALERT CARD at check-in to the Emergency Department and triage nurse.  Should you have questions after your visit or need to cancel or reschedule your appointment, please contact  [facility]  Dept: [phone]  and follow the prompts.  Office hours are 8:00 a.m. to 4:30 p.m. Monday - Friday. Please note that voicemails left after 4:00 p.m. may not be returned until the following business day.  We are closed weekends and major holidays. You have access to a nurse at all times for urgent questions. Please call the main number to the clinic Dept: [phone] and follow the prompts.   For any non-urgent questions, you may also contact your provider using MyChart. We now offer e-Visits for anyone 18 and older to request care online for non-urgent symptoms. For details visit [email or web address].   Also download the MyChart app! Go to the app store, search "MyChart", open the app, select , and log in with your MyChart username and password.  Masks are optional in the cancer centers. If you would like for your care team to wear a mask while they are taking care of you, please let them know. You may have one support person who is at least 75 years old accompany you for your appointments. Trastuzumab Injection What is this medication? TRASTUZUMAB (tras TOO zoo mab) treats breast cancer and stomach cancer. It works by blocking a protein that causes cancer cells to grow and multiply. This helps to slow or stop the spread of cancer cells. This medicine may be used for other purposes; ask your health care provider or pharmacist if you have questions.   COMMON BRAND NAME(S): Herceptin, Herzuma, KANJINTI, Ogivri, Ontruzant, Trazimera What should I tell my care team before I take this medication? They need to know if you have any of these conditions: Heart failure Lung disease An unusual or allergic reaction to trastuzumab, other medications, foods, dyes,  or preservatives Pregnant or trying to get pregnant Breast-feeding How should I use this medication? This medication is injected into a vein. It is given by your care team in a hospital or clinic setting. Talk to your care team about the use of this medication in children. It is not approved for use in children. Overdosage: If you think you have taken too much of this medicine contact a poison control center or emergency room at once. NOTE: This medicine is only for you. Do not share this medicine with others. What if I miss a dose? Keep appointments for follow-up doses. It is important not to miss your dose. Call your care team if you are unable to keep an appointment. What may interact with this medication? Certain types of chemotherapy, such as daunorubicin, doxorubicin, epirubicin, idarubicin This list may not describe all possible interactions. Give your health care provider a list of all the medicines, herbs, non-prescription drugs, or dietary supplements you use. Also tell them if you smoke, drink alcohol, or use illegal drugs. Some items may interact with your medicine. What should I watch for while using this medication? Your condition will be monitored carefully while you are receiving this medication. This medication may make you feel generally unwell. This is not uncommon, as chemotherapy affects healthy cells as well as cancer cells. Report any side effects. Continue your course of treatment even though you feel ill unless your care team tells you to stop. This medication may increase your risk of getting an infection. Call your care team for advice if you get a fever, chills, sore throat, or other symptoms of a cold or flu. Do not treat yourself. Try to avoid being around people who are sick. Avoid taking medications that contain aspirin, acetaminophen, ibuprofen, naproxen, or ketoprofen unless instructed by your care team. These medications can hide a fever. Talk to your care team if  you may be pregnant. Serious birth defects can occur if you take this medication during pregnancy and for 7 months after the last dose. You will need a negative pregnancy test before starting this medication. Contraception is recommended while taking this medication and for 7 months after the last dose. Your care team can help you find the option that works for you. Do not breastfeed while taking this medication and for 7 months after stopping treatment. What side effects may I notice from receiving this medication? Side effects that you should report to your care team as soon as possible: Allergic reactions or angioedema--skin rash, itching or hives, swelling of the face, eyes, lips, tongue, arms, or legs, trouble swallowing or breathing Dry cough, shortness of breath or trouble breathing Heart failure--shortness of breath, swelling of the ankles, feet, or hands, sudden weight gain, unusual weakness or fatigue Infection--fever, chills, cough, or sore throat Infusion reactions--chest pain, shortness of breath or trouble breathing, feeling faint or lightheaded Side effects that usually do not require medical attention (report to your care team if they continue or are bothersome): Diarrhea Dizziness Headache Nausea Trouble sleeping Vomiting This list may not describe all possible side effects. Call your doctor for medical advice about side effects. You may report side effects to FDA at 1-800-FDA-1088. Where should I keep my   medication? This medication is given in a hospital or clinic. It will not be stored at home. NOTE: This sheet is a summary. It may not cover all possible information. If you have questions about this medicine, talk to your doctor, pharmacist, or health care provider.  2023 Elsevier/Gold Standard (2021-07-17 00:00:00) Paclitaxel Injection What is this medication? PACLITAXEL (PAK li TAX el) treats some types of cancer. It works by slowing down the growth of cancer cells. This  medicine may be used for other purposes; ask your health care provider or pharmacist if you have questions. COMMON BRAND NAME(S): Onxol, Taxol What should I tell my care team before I take this medication? They need to know if you have any of these conditions: Heart disease Liver disease Low white blood cell levels An unusual or allergic reaction to paclitaxel, other medications, foods, dyes, or preservatives If you or your partner are pregnant or trying to get pregnant Breast-feeding How should I use this medication? This medication is injected into a vein. It is given by your care team in a hospital or clinic setting. Talk to your care team about the use of this medication in children. While it may be given to children for selected conditions, precautions do apply. Overdosage: If you think you have taken too much of this medicine contact a poison control center or emergency room at once. NOTE: This medicine is only for you. Do not share this medicine with others. What if I miss a dose? Keep appointments for follow-up doses. It is important not to miss your dose. Call your care team if you are unable to keep an appointment. What may interact with this medication? Do not take this medication with any of the following: Live virus vaccines Other medications may affect the way this medication works. Talk with your care team about all of the medications you take. They may suggest changes to your treatment plan to lower the risk of side effects and to make sure your medications work as intended. This list may not describe all possible interactions. Give your health care provider a list of all the medicines, herbs, non-prescription drugs, or dietary supplements you use. Also tell them if you smoke, drink alcohol, or use illegal drugs. Some items may interact with your medicine. What should I watch for while using this medication? Your condition will be monitored carefully while you are receiving  this medication. You may need blood work while taking this medication. This medication may make you feel generally unwell. This is not uncommon as chemotherapy can affect healthy cells as well as cancer cells. Report any side effects. Continue your course of treatment even though you feel ill unless your care team tells you to stop. This medication can cause serious allergic reactions. To reduce the risk, your care team may give you other medications to take before receiving this one. Be sure to follow the directions from your care team. This medication may increase your risk of getting an infection. Call your care team for advice if you get a fever, chills, sore throat, or other symptoms of a cold or flu. Do not treat yourself. Try to avoid being around people who are sick. This medication may increase your risk to bruise or bleed. Call your care team if you notice any unusual bleeding. Be careful brushing or flossing your teeth or using a toothpick because you may get an infection or bleed more easily. If you have any dental work done, tell your dentist you are receiving   this medication. Talk to your care team if you may be pregnant. Serious birth defects can occur if you take this medication during pregnancy. Talk to your care team before breastfeeding. Changes to your treatment plan may be needed. What side effects may I notice from receiving this medication? Side effects that you should report to your care team as soon as possible: Allergic reactions--skin rash, itching, hives, swelling of the face, lips, tongue, or throat Heart rhythm changes--fast or irregular heartbeat, dizziness, feeling faint or lightheaded, chest pain, trouble breathing Increase in blood pressure Infection--fever, chills, cough, sore throat, wounds that don't heal, pain or trouble when passing urine, general feeling of discomfort or being unwell Low blood pressure--dizziness, feeling faint or lightheaded, blurry vision Low  red blood cell level--unusual weakness or fatigue, dizziness, headache, trouble breathing Painful swelling, warmth, or redness of the skin, blisters or sores at the infusion site Pain, tingling, or numbness in the hands or feet Slow heartbeat--dizziness, feeling faint or lightheaded, confusion, trouble breathing, unusual weakness or fatigue Unusual bruising or bleeding Side effects that usually do not require medical attention (report to your care team if they continue or are bothersome): Diarrhea Hair loss Joint pain Loss of appetite Muscle pain Nausea Vomiting This list may not describe all possible side effects. Call your doctor for medical advice about side effects. You may report side effects to FDA at 1-800-FDA-1088. Where should I keep my medication? This medication is given in a hospital or clinic. It will not be stored at home. NOTE: This sheet is a summary. It may not cover all possible information. If you have questions about this medicine, talk to your doctor, pharmacist, or health care provider.  2023 Elsevier/Gold Standard (2021-07-19 00:00:00) 

## 2022-01-07 NOTE — Assessment & Plan Note (Signed)
Dillan is a 76 year old woman with stage IIa ER HER2 positive breast cancer in her left breast diagnosed in July 2023, status postlumpectomy, now continuing on adjuvant chemotherapy with Taxol and Herceptin weekly.  Bernette is tolerating adjuvant chemotherapy well and will proceed with Taxol and Herceptin today.  We are monitoring her closely for peripheral neuropathy which she has not developed at this point.  She has experienced some epistaxis mainly when leaning over washing her face in the evening.  We discussed using saline nasal spray daily to keep her nares lubricated.  We also discussed using a humidifier at night along with having Afrin on hand should she have a nosebleed that does not resolve after pinching her nose for 10 to 15 minutes.  Melat verbalized understanding of the above plan and is in agreement with it.  She will continue with weekly chemotherapy and we will see her back next week for labs, follow-up, and her next treatment.

## 2022-01-10 ENCOUNTER — Inpatient Hospital Stay: Payer: Medicare HMO | Admitting: Dietician

## 2022-01-10 NOTE — Progress Notes (Signed)
Patient attended Nutrition 101 class 01/10/22

## 2022-01-11 MED FILL — Dexamethasone Sodium Phosphate Inj 100 MG/10ML: INTRAMUSCULAR | Qty: 1 | Status: AC

## 2022-01-14 ENCOUNTER — Inpatient Hospital Stay: Payer: Medicare HMO | Admitting: Licensed Clinical Social Worker

## 2022-01-14 ENCOUNTER — Inpatient Hospital Stay: Payer: Medicare HMO

## 2022-01-14 ENCOUNTER — Inpatient Hospital Stay: Payer: Medicare HMO | Admitting: Hematology and Oncology

## 2022-01-14 ENCOUNTER — Encounter: Payer: Self-pay | Admitting: Hematology and Oncology

## 2022-01-14 VITALS — BP 152/69 | HR 70 | Temp 97.8°F | Resp 16 | Ht 65.0 in | Wt 118.2 lb

## 2022-01-14 VITALS — BP 126/71 | HR 72 | Temp 99.5°F | Resp 18

## 2022-01-14 DIAGNOSIS — C50212 Malignant neoplasm of upper-inner quadrant of left female breast: Secondary | ICD-10-CM

## 2022-01-14 DIAGNOSIS — Z95828 Presence of other vascular implants and grafts: Secondary | ICD-10-CM

## 2022-01-14 DIAGNOSIS — Z5112 Encounter for antineoplastic immunotherapy: Secondary | ICD-10-CM | POA: Diagnosis not present

## 2022-01-14 DIAGNOSIS — Z170 Estrogen receptor positive status [ER+]: Secondary | ICD-10-CM | POA: Diagnosis not present

## 2022-01-14 DIAGNOSIS — Z5111 Encounter for antineoplastic chemotherapy: Secondary | ICD-10-CM | POA: Diagnosis not present

## 2022-01-14 LAB — CBC WITH DIFFERENTIAL (CANCER CENTER ONLY)
Abs Immature Granulocytes: 0.04 10*3/uL (ref 0.00–0.07)
Basophils Absolute: 0.1 10*3/uL (ref 0.0–0.1)
Basophils Relative: 1 %
Eosinophils Absolute: 0.1 10*3/uL (ref 0.0–0.5)
Eosinophils Relative: 1 %
HCT: 31.5 % — ABNORMAL LOW (ref 36.0–46.0)
Hemoglobin: 10.7 g/dL — ABNORMAL LOW (ref 12.0–15.0)
Immature Granulocytes: 1 %
Lymphocytes Relative: 36 %
Lymphs Abs: 1.3 10*3/uL (ref 0.7–4.0)
MCH: 30.2 pg (ref 26.0–34.0)
MCHC: 34 g/dL (ref 30.0–36.0)
MCV: 89 fL (ref 80.0–100.0)
Monocytes Absolute: 0.3 10*3/uL (ref 0.1–1.0)
Monocytes Relative: 9 %
Neutro Abs: 1.9 10*3/uL (ref 1.7–7.7)
Neutrophils Relative %: 52 %
Platelet Count: 172 10*3/uL (ref 150–400)
RBC: 3.54 MIL/uL — ABNORMAL LOW (ref 3.87–5.11)
RDW: 13.5 % (ref 11.5–15.5)
WBC Count: 3.7 10*3/uL — ABNORMAL LOW (ref 4.0–10.5)
nRBC: 0 % (ref 0.0–0.2)

## 2022-01-14 LAB — CMP (CANCER CENTER ONLY)
ALT: 22 U/L (ref 0–44)
AST: 18 U/L (ref 15–41)
Albumin: 4.1 g/dL (ref 3.5–5.0)
Alkaline Phosphatase: 61 U/L (ref 38–126)
Anion gap: 4 — ABNORMAL LOW (ref 5–15)
BUN: 22 mg/dL (ref 8–23)
CO2: 29 mmol/L (ref 22–32)
Calcium: 9.4 mg/dL (ref 8.9–10.3)
Chloride: 103 mmol/L (ref 98–111)
Creatinine: 0.6 mg/dL (ref 0.44–1.00)
GFR, Estimated: >60 mL/min (ref >60)
Glucose, Bld: 96 mg/dL (ref 70–99)
Potassium: 4.3 mmol/L (ref 3.5–5.1)
Sodium: 136 mmol/L (ref 135–145)
Total Bilirubin: 0.5 mg/dL (ref 0.3–1.2)
Total Protein: 6.8 g/dL (ref 6.5–8.1)

## 2022-01-14 MED ORDER — TRASTUZUMAB-DKST CHEMO 150 MG IV SOLR
2.0000 mg/kg | Freq: Once | INTRAVENOUS | Status: AC
  Administered 2022-01-14: 105 mg via INTRAVENOUS
  Filled 2022-01-14: qty 5

## 2022-01-14 MED ORDER — HEPARIN SOD (PORK) LOCK FLUSH 100 UNIT/ML IV SOLN: 500.0000 [IU] | INTRAVENOUS | Status: DC | PRN

## 2022-01-14 MED ORDER — SODIUM CHLORIDE 0.9 % IV SOLN
80.0000 mg/m2 | Freq: Once | INTRAVENOUS | Status: AC
  Administered 2022-01-14: 126 mg via INTRAVENOUS
  Filled 2022-01-14: qty 21

## 2022-01-14 MED ORDER — FAMOTIDINE IN NACL 20-0.9 MG/50ML-% IV SOLN
20.0000 mg | Freq: Once | INTRAVENOUS | Status: AC
  Administered 2022-01-14: 20 mg via INTRAVENOUS
  Filled 2022-01-14: qty 50

## 2022-01-14 MED ORDER — ACETAMINOPHEN 325 MG PO TABS
650.0000 mg | ORAL_TABLET | Freq: Once | ORAL | Status: AC
  Administered 2022-01-14: 650 mg via ORAL
  Filled 2022-01-14: qty 2

## 2022-01-14 MED ORDER — SODIUM CHLORIDE 0.9% FLUSH: 10.0000 mL | INTRAVENOUS | Status: DC | PRN

## 2022-01-14 MED ORDER — CETIRIZINE HCL 10 MG/ML IV SOLN
10.0000 mg | Freq: Once | INTRAVENOUS | Status: AC
  Administered 2022-01-14: 10 mg via INTRAVENOUS
  Filled 2022-01-14: qty 1

## 2022-01-14 MED ORDER — SODIUM CHLORIDE 0.9 % IV SOLN: Freq: Once | INTRAVENOUS | Status: AC

## 2022-01-14 MED ORDER — SODIUM CHLORIDE 0.9% FLUSH
10.0000 mL | INTRAVENOUS | Status: AC | PRN
  Administered 2022-01-14: 10 mL

## 2022-01-14 MED ORDER — HEPARIN SOD (PORK) LOCK FLUSH 100 UNIT/ML IV SOLN
500.0000 [IU] | Freq: Once | INTRAVENOUS | Status: AC | PRN
  Administered 2022-01-14: 500 [IU]

## 2022-01-14 MED ORDER — SODIUM CHLORIDE 0.9 % IV SOLN
10.0000 mg | Freq: Once | INTRAVENOUS | Status: AC
  Administered 2022-01-14: 10 mg via INTRAVENOUS
  Filled 2022-01-14: qty 10

## 2022-01-14 MED ORDER — SODIUM CHLORIDE 0.9% FLUSH
10.0000 mL | INTRAVENOUS | Status: DC | PRN
  Administered 2022-01-14: 10 mL

## 2022-01-14 NOTE — Assessment & Plan Note (Addendum)
This is a very pleasant 76 year old postmenopausal female patient with newly diagnosed left breast upper inner quadrant invasive ductal carcinoma, grade 2, ER +90% strong staining, PR 0% negative, HER2 positive by IHC 3+, Ki-67 of 30% referred to breast [facility] for additional recommendations.   She is tolerating adjuvant chemotherapy very well.  No dose-limiting toxicity.  Physical examination today without any concerns.  She will complete adjuvant Taxol and Herceptin followed by adjuvant Herceptin. Okay to overlap radiation along with Herceptin.  She understands that this is pretty standard of care. She is due for echocardiogram in November, this has been ordered.  Return to clinic per integrated scheduling.  We will plan to start antiestrogen therapy after she completes adjuvant radiation.  Thank you for consulting [place] in the care of this patient.  Please do not hesitate to contact us with any additional questions or concerns.

## 2022-01-14 NOTE — Progress Notes (Signed)
[place] NOTE  Patient Care Team: Dorothyann Peng, NP as PCP - General (Family Medicine) Rolm Bookbinder, MD as Consulting Physician (General Surgery) Benay Pike, MD as Consulting Physician (Hematology and Oncology) Kyung Rudd, MD as Consulting Physician (Radiation Oncology) Rockwell Germany, RN as Oncology Nurse Navigator Mauro Kaufmann, RN as Oncology Nurse Navigator  CHIEF COMPLAINTS/PURPOSE OF CONSULTATION:  Newly diagnosed breast cancer  HISTORY OF PRESENTING ILLNESS:   Patricia Marshall 76 y.o. female is here because of recent diagnosis of left breast cancer.  I reviewed her records extensively and collaborated the history with the patient.  SUMMARY OF ONCOLOGIC HISTORY: Oncology History  Malignant neoplasm of upper-inner quadrant of left breast in female, estrogen receptor positive ([place])  09/20/2021 Mammogram   Screening mammogram showed extremely dense breasts and a possible mass in the left breast warranting further evaluation.  Breast density category is D Diagnostic mammogram of the breast showed indeterminate mass in the 11:00 location of the left breast.  Indeterminate mass in the lower anterior left axilla.   10/04/2021 Pathology Results   Pathology showed grade 2 invasive ductal carcinoma, left axillary soft tissue needle core biopsy showed benign breast tissue with patchy changes prognostics from the breast tumor showed ER 90% positive strong staining PR 0%, negative, Ki-67 of 30% and HER2 positive for IHC 3+   10/19/2021 Initial Diagnosis   Malignant neoplasm of upper-inner quadrant of left breast in female, estrogen receptor positive ([place])   10/23/2021 Cancer Staging   Staging form: Breast, AJCC 8th Edition - Pathologic: Stage IIA (pT2, pN0, cM0, G3, ER+, PR-, HER2+) - Signed by Benay Pike, MD on 11/26/2021 Histologic grading system: 3 grade system   11/10/2021 Genetic Testing   Negative hereditary cancer genetic testing: no  pathogenic variants detected in [facility] CancerNext-Expanded +RNA Panel.  Report date is November 10, 2021.   The CancerNext-Expanded gene panel offered by [facility] and includes sequencing, rearrangement, and RNA analysis for the following 77 genes: AIP, ALK, APC, ATM, AXIN2, BAP1, BARD1, BLM, BMPR1A, BRCA1, BRCA2, BRIP1, CDC73, CDH1, CDK4, CDKN1B, CDKN2A, CHEK2, CTNNA1, DICER1, FANCC, FH, FLCN, GALNT12, KIF1B, LZTR1, MAX, MEN1, MET, MLH1, MSH2, MSH3, MSH6, MUTYH, NBN, NF1, NF2, NTHL1, PALB2, PHOX2B, PMS2, POT1, PRKAR1A, PTCH1, PTEN, RAD51C, RAD51D, RB1, RECQL, RET, SDHA, SDHAF2, SDHB, SDHC, SDHD, SMAD4, SMARCA4, SMARCB1, SMARCE1, STK11, SUFU, TMEM127, TP53, TSC1, TSC2, VHL and XRCC2 (sequencing and deletion/duplication); EGFR, EGLN1, HOXB13, KIT, MITF, PDGFRA, POLD1, and POLE (sequencing only); EPCAM and GREM1 (deletion/duplication only).    11/15/2021 Surgery   Left breast lumpectomy: 2.4 cm grade 3 invasive ductal carcinoma, margins -2 sentinel lymph nodes negative.   12/10/2021 -  Chemotherapy   Patient is on Treatment Plan : BREAST Paclitaxel + Trastuzumab q7d / Trastuzumab q21d      Interval history  Ms. Mcgloin is here for follow-up.  Since last visit, she continues to have no evidence of neuropathy.  She always feels cold in her hands but this has been going on for almost 50 years now.  She has started to lose most of her hair  No nausea or vomiting.   She has good bowel movements except for intermittent abdominal cramps right before she has a bowel movement. Rest of the pertinent 10 point ROS reviewed and negative  MEDICAL HISTORY:  Past Medical History:  Diagnosis Date   Arthritis    Breast cancer ([place])    Chicken pox    High cholesterol    PONV (postoperative nausea and vomiting)  Vitamin D deficiency     SURGICAL HISTORY: Past Surgical History:  Procedure Laterality Date   [place]   BREAST LUMPECTOMY WITH AXILLARY LYMPH NODE BIOPSY  Left 11/15/2021   Procedure: LEFT BREAST LUMPECTOMY WITH AXILLARY SENTINEL LYMPH NODE BIOPSY;  Surgeon: Rolm Bookbinder, MD;  Location: [place];  Service: General;  Laterality: Left;   PORTACATH PLACEMENT Right 11/15/2021   Procedure: INSERTION PORT-A-CATH;  Surgeon: Rolm Bookbinder, MD;  Location: [place];  Service: General;  Laterality: Right;   TONSILLECTOMY AND ADENOIDECTOMY  1954    SOCIAL HISTORY: Social History   Socioeconomic History   Marital status: Married    Spouse name: Not on file   Number of children: Not on file   Years of education: Not on file   Highest education level: Not on file  Occupational History   Not on file  Tobacco Use   Smoking status: Never   Smokeless tobacco: Never  Vaping Use   Vaping Use: Never used  Substance and Sexual Activity   Alcohol use: Yes    Alcohol/week: 1.0 standard drink of alcohol    Types: 1 Glasses of wine per week   Drug use: Never   Sexual activity: Not on file  Other Topics Concern   Not on file  Social History Narrative   Married    Retired    Investment banker, operational of Radio broadcast assistant Strain: [place]  (09/03/2021)   Overall Financial Resource Strain (CARDIA)    Difficulty of Paying Living Expenses: Not hard at all  Food Insecurity: No Food Insecurity (09/03/2021)   Hunger Vital Sign    Worried About Running Out of Food in the Last Year: Never true    [place] in the Last Year: Never true  Transportation Needs: No Transportation Needs (09/03/2021)   PRAPARE - Hydrologist (Medical): No    Lack of Transportation (Non-Medical): No  Physical Activity: Sufficiently Active (09/03/2021)   Exercise Vital Sign    Days of Exercise per Week: 4 days    Minutes of Exercise per Session: 120 min  Stress: No Stress Concern Present (09/03/2021)   [place]    Feeling of Stress :  Not at all  Social Connections: [place] (09/03/2021)   Social Connection and Isolation Panel [NHANES]    Frequency of Communication with Friends and Family: More than three times a week    Frequency of Social Gatherings with Friends and Family: More than three times a week    Attends Religious Services: More than 4 times per year    Active Member of [employer] or Organizations: Yes    Attends Music therapist: More than 4 times per year    Marital Status: Married  Human resources officer Violence: Not At Risk (09/03/2021)   Humiliation, Afraid, Rape, and Kick questionnaire    Fear of Current or Ex-Partner: No    Emotionally Abused: No    Physically Abused: No    Sexually Abused: No    FAMILY HISTORY: Family History  Problem Relation Age of Onset   Stomach cancer Mother 50   Heart attack Father 27   Cancer Sister        dx mid 22s; unknown primary w/ mets; treated like ovarian cancer   Heart attack Brother 28   Heart attack Brother 70  ALLERGIES:  is allergic to other, ampicillin, and anesthesia [id number] [propofol].  MEDICATIONS:  Current Outpatient Medications  Medication Sig Dispense Refill   apixaban (ELIQUIS) 5 MG TABS tablet Take 1 tablet (5 mg total) by mouth 2 (two) times daily. 60 tablet 3   Cholecalciferol (VITAMIN D) 50 MCG (2000 UT) tablet Take 2,000 Units by mouth daily.     lidocaine-prilocaine (EMLA) cream Apply to affected area once 30 g 3   ondansetron (ZOFRAN) 4 MG tablet Take 1 tablet (4 mg total) by mouth daily as needed for nausea or vomiting. (Patient not taking: Reported on 12/25/2021) 10 tablet 0   ondansetron (ZOFRAN) 8 MG tablet Take 1 tablet (8 mg total) by mouth every 8 (eight) hours as needed for nausea or vomiting. (Patient not taking: Reported on 12/25/2021) 30 tablet 1   prochlorperazine (COMPAZINE) 10 MG tablet Take 1 tablet (10 mg total) by mouth every 6 (six) hours as needed for nausea or vomiting. (Patient not taking: Reported on  12/25/2021) 30 tablet 1   traMADol (ULTRAM) 50 MG tablet Take 1 tablet (50 mg total) by mouth every 6 (six) hours as needed. (Patient not taking: Reported on 12/25/2021) 10 tablet 0   No current facility-administered medications for this visit.   Facility-Administered Medications Ordered in Other Visits  Medication Dose Route Frequency Provider Last Rate Last Admin   famotidine (PEPCID) IVPB 20 mg premix  20 mg Intravenous Once Rosha Cocker, Arletha Pili, MD 200 mL/hr at 01/14/22 1231 20 mg at 01/14/22 1231   heparin lock flush 100 unit/mL  500 Units Intracatheter Once PRN Trany Chernick, Arletha Pili, MD       PACLitaxel (TAXOL) 126 mg in sodium chloride 0.9 % 250 mL chemo infusion (</= 9m/m2)  80 mg/m2 (Treatment Plan Recorded) Intravenous Once Tobias Avitabile, MD       sodium chloride flush (NS) 0.9 % injection 10 mL  10 mL Intracatheter PRN Yerlin Gasparyan, MD       trastuzumab-dkst (OGIVRI) 105 mg in sodium chloride 0.9 % 250 mL chemo infusion  2 mg/kg (Treatment Plan Recorded) Intravenous Once Marcellas Marchant, PArletha Pili MD        REVIEW OF SYSTEMS:   Constitutional: Denies fevers, chills or abnormal night sweats Eyes: Denies blurriness of vision, double vision or watery eyes Ears, nose, mouth, throat, and face: Denies mucositis or sore throat Respiratory: Denies cough, dyspnea or wheezes Cardiovascular: Denies palpitation, chest discomfort or lower extremity swelling Gastrointestinal:  Denies nausea, heartburn or change in bowel habits Skin: Denies abnormal skin rashes Lymphatics: Denies new lymphadenopathy or easy bruising Neurological:Denies numbness, tingling or new weaknesses Behavioral/Psych: Mood is stable, no new changes  Breast: Denies any palpable lumps or discharge All other systems were reviewed with the patient and are negative.  PHYSICAL EXAMINATION: ECOG PERFORMANCE STATUS: 0 - Asymptomatic  Vitals:   01/14/22 1126  BP: (!) 152/69  Pulse: 70  Resp: 16  Temp: 97.8 F (36.6 C)  SpO2: 100%     Filed Weights   01/14/22 1126  Weight: 118 lb 3.2 oz (53.6 kg)    Physical Exam Constitutional:      Appearance: Normal appearance. She is normal weight.  Cardiovascular:     Rate and Rhythm: Normal rate and regular rhythm.     Pulses: Normal pulses.     Heart sounds: Normal heart sounds.  Pulmonary:     Effort: Pulmonary effort is normal.     Breath sounds: Normal breath sounds.  Musculoskeletal:        General:  No swelling or tenderness.     Cervical back: Normal range of motion and neck supple. No rigidity.  Lymphadenopathy:     Cervical: No cervical adenopathy.  Skin:    General: Skin is warm and dry.  Neurological:     General: No focal deficit present.     Mental Status: She is alert.  Psychiatric:        Mood and Affect: Mood normal.    LABORATORY DATA:  I have reviewed the data as listed Lab Results  Component Value Date   WBC 3.7 (L) 01/14/2022   HGB 10.7 (L) 01/14/2022   HCT 31.5 (L) 01/14/2022   MCV 89.0 01/14/2022   PLT 172 01/14/2022   Lab Results  Component Value Date   NA 136 01/14/2022   K 4.3 01/14/2022   CL 103 01/14/2022   CO2 29 01/14/2022    RADIOGRAPHIC STUDIES: I have personally reviewed the radiological reports and agreed with the findings in the report.  ASSESSMENT AND PLAN:  Malignant neoplasm of upper-inner quadrant of left breast in female, estrogen receptor positive ([place]) This is a very pleasant 76 year old postmenopausal female patient with newly diagnosed left breast upper inner quadrant invasive ductal carcinoma, grade 2, ER +90% strong staining, PR 0% negative, HER2 positive by IHC 3+, Ki-67 of 30% referred to breast [place] for additional recommendations.   She is tolerating adjuvant chemotherapy very well.  No dose-limiting toxicity.  Physical examination today without any concerns.  She will complete adjuvant Taxol and Herceptin followed by adjuvant Herceptin. Okay to overlap radiation along with Herceptin.  She understands  that this is pretty standard of care. She is due for echocardiogram in November, this has been ordered.  Return to clinic per integrated scheduling.  We will plan to start antiestrogen therapy after she completes adjuvant radiation.  Thank you for consulting [place] in the care of this patient.  Please do not hesitate to contact us with any additional questions or concerns.    Total time spent: 30 minutes including history, physical exam, review of records, counseling and coordination of care All questions were answered. The patient knows to call the clinic with any problems, questions or concerns.    Benay Pike, MD 01/14/22

## 2022-01-14 NOTE — Progress Notes (Signed)
[place] Work  Clinical Social Work was referred by  Systems analyst  for wig assistance.  Clinical Social Worker met with patient  to offer support and assess for needs. Patient has been experiencing hair loss from chemo. Provided with voucher & instructions for [facility] wig voucher today. CSW also informed patient of the support team and support services at [facility].  Patient denied any other needs at this time. CSW provided contact information and encouraged patient to call with any questions or concerns.     [place], [place] Worker [employer]

## 2022-01-14 NOTE — Patient Instructions (Signed)
[facility]  Discharge Instructions: Thank you for choosing [facility] to provide your oncology and hematology care.   If you have a lab appointment with the [facility], please go directly to the [facility] and check in at the registration area.   Wear comfortable clothing and clothing appropriate for easy access to any Portacath or PICC line.   We strive to give you quality time with your provider. You may need to reschedule your appointment if you arrive late (15 or more minutes).  Arriving late affects you and other patients whose appointments are after yours.  Also, if you miss three or more appointments without notifying the office, you may be dismissed from the clinic at the provider's discretion.      For prescription refill requests, have your pharmacy contact our office and allow 72 hours for refills to be completed.    Today you received the following chemotherapy and/or immunotherapy agents: Trastuzumab (Ogivri) and Paclitaxel (Taxol)   To help prevent nausea and vomiting after your treatment, we encourage you to take your nausea medication as directed.  BELOW ARE SYMPTOMS THAT SHOULD BE REPORTED IMMEDIATELY: *FEVER GREATER THAN 100.4 F (38 C) OR HIGHER *CHILLS OR SWEATING *NAUSEA AND VOMITING THAT IS NOT CONTROLLED WITH YOUR NAUSEA MEDICATION *UNUSUAL SHORTNESS OF BREATH *UNUSUAL BRUISING OR BLEEDING *URINARY PROBLEMS (pain or burning when urinating, or frequent urination) *BOWEL PROBLEMS (unusual diarrhea, constipation, pain near the anus) TENDERNESS IN MOUTH AND THROAT WITH OR WITHOUT PRESENCE OF ULCERS (sore throat, sores in mouth, or a toothache) UNUSUAL RASH, SWELLING OR PAIN  UNUSUAL VAGINAL DISCHARGE OR ITCHING   Items with * indicate a potential emergency and should be followed up as soon as possible or go to the Emergency Department if any problems should occur.  Please show the CHEMOTHERAPY ALERT CARD or  IMMUNOTHERAPY ALERT CARD at check-in to the Emergency Department and triage nurse.  Should you have questions after your visit or need to cancel or reschedule your appointment, please contact [facility]  Dept: [phone]  and follow the prompts.  Office hours are 8:00 a.m. to 4:30 p.m. Monday - Friday. Please note that voicemails left after 4:00 p.m. may not be returned until the following business day.  We are closed weekends and major holidays. You have access to a nurse at all times for urgent questions. Please call the main number to the clinic Dept: [phone] and follow the prompts.   For any non-urgent questions, you may also contact your provider using MyChart. We now offer e-Visits for anyone 15 and older to request care online for non-urgent symptoms. For details visit [email or web address].   Also download the MyChart app! Go to the app store, search "MyChart", open the app, select [facility], and log in with your MyChart username and password.  Masks are optional in the cancer centers. If you would like for your care team to wear a mask while they are taking care of you, please let them know. You may have one support person who is at least 75 years old accompany you for your appointments. Trastuzumab Injection What is this medication? TRASTUZUMAB (tras TOO zoo mab) treats breast cancer and stomach cancer. It works by blocking a protein that causes cancer cells to grow and multiply. This helps to slow or stop the spread of cancer cells. This medicine may be used for other purposes; ask your health care provider or pharmacist if you have questions.  COMMON BRAND NAME(S): Herceptin, Janae Bridgeman, Ontruzant, Trazimera What should I tell my care team before I take this medication? They need to know if you have any of these conditions: Heart failure Lung disease An unusual or allergic reaction to trastuzumab, other medications, foods, dyes,  or preservatives Pregnant or trying to get pregnant Breast-feeding How should I use this medication? This medication is injected into a vein. It is given by your care team in a hospital or clinic setting. Talk to your care team about the use of this medication in children. It is not approved for use in children. Overdosage: If you think you have taken too much of this medicine contact a poison control center or emergency room at once. NOTE: This medicine is only for you. Do not share this medicine with others. What if I miss a dose? Keep appointments for follow-up doses. It is important not to miss your dose. Call your care team if you are unable to keep an appointment. What may interact with this medication? Certain types of chemotherapy, such as daunorubicin, doxorubicin, epirubicin, idarubicin This list may not describe all possible interactions. Give your health care provider a list of all the medicines, herbs, non-prescription drugs, or dietary supplements you use. Also tell them if you smoke, drink alcohol, or use illegal drugs. Some items may interact with your medicine. What should I watch for while using this medication? Your condition will be monitored carefully while you are receiving this medication. This medication may make you feel generally unwell. This is not uncommon, as chemotherapy affects healthy cells as well as cancer cells. Report any side effects. Continue your course of treatment even though you feel ill unless your care team tells you to stop. This medication may increase your risk of getting an infection. Call your care team for advice if you get a fever, chills, sore throat, or other symptoms of a cold or flu. Do not treat yourself. Try to avoid being around people who are sick. Avoid taking medications that contain aspirin, acetaminophen, ibuprofen, naproxen, or ketoprofen unless instructed by your care team. These medications can hide a fever. Talk to your care team if  you may be pregnant. Serious birth defects can occur if you take this medication during pregnancy and for 7 months after the last dose. You will need a negative pregnancy test before starting this medication. Contraception is recommended while taking this medication and for 7 months after the last dose. Your care team can help you find the option that works for you. Do not breastfeed while taking this medication and for 7 months after stopping treatment. What side effects may I notice from receiving this medication? Side effects that you should report to your care team as soon as possible: Allergic reactions or angioedema--skin rash, itching or hives, swelling of the face, eyes, lips, tongue, arms, or legs, trouble swallowing or breathing Dry cough, shortness of breath or trouble breathing Heart failure--shortness of breath, swelling of the ankles, feet, or hands, sudden weight gain, unusual weakness or fatigue Infection--fever, chills, cough, or sore throat Infusion reactions--chest pain, shortness of breath or trouble breathing, feeling faint or lightheaded Side effects that usually do not require medical attention (report to your care team if they continue or are bothersome): Diarrhea Dizziness Headache Nausea Trouble sleeping Vomiting This list may not describe all possible side effects. Call your doctor for medical advice about side effects. You may report side effects to FDA at 1-800-FDA-1088. Where should I keep my  medication? This medication is given in a hospital or clinic. It will not be stored at home. NOTE: This sheet is a summary. It may not cover all possible information. If you have questions about this medicine, talk to your doctor, pharmacist, or health care provider.  2023 Elsevier/Gold Standard (2021-07-17 00:00:00) Paclitaxel Injection What is this medication? PACLITAXEL (PAK li TAX el) treats some types of cancer. It works by slowing down the growth of cancer cells. This  medicine may be used for other purposes; ask your health care provider or pharmacist if you have questions. COMMON BRAND NAME(S): Onxol, Taxol What should I tell my care team before I take this medication? They need to know if you have any of these conditions: Heart disease Liver disease Low white blood cell levels An unusual or allergic reaction to paclitaxel, other medications, foods, dyes, or preservatives If you or your partner are pregnant or trying to get pregnant Breast-feeding How should I use this medication? This medication is injected into a vein. It is given by your care team in a hospital or clinic setting. Talk to your care team about the use of this medication in children. While it may be given to children for selected conditions, precautions do apply. Overdosage: If you think you have taken too much of this medicine contact a poison control center or emergency room at once. NOTE: This medicine is only for you. Do not share this medicine with others. What if I miss a dose? Keep appointments for follow-up doses. It is important not to miss your dose. Call your care team if you are unable to keep an appointment. What may interact with this medication? Do not take this medication with any of the following: Live virus vaccines Other medications may affect the way this medication works. Talk with your care team about all of the medications you take. They may suggest changes to your treatment plan to lower the risk of side effects and to make sure your medications work as intended. This list may not describe all possible interactions. Give your health care provider a list of all the medicines, herbs, non-prescription drugs, or dietary supplements you use. Also tell them if you smoke, drink alcohol, or use illegal drugs. Some items may interact with your medicine. What should I watch for while using this medication? Your condition will be monitored carefully while you are receiving  this medication. You may need blood work while taking this medication. This medication may make you feel generally unwell. This is not uncommon as chemotherapy can affect healthy cells as well as cancer cells. Report any side effects. Continue your course of treatment even though you feel ill unless your care team tells you to stop. This medication can cause serious allergic reactions. To reduce the risk, your care team may give you other medications to take before receiving this one. Be sure to follow the directions from your care team. This medication may increase your risk of getting an infection. Call your care team for advice if you get a fever, chills, sore throat, or other symptoms of a cold or flu. Do not treat yourself. Try to avoid being around people who are sick. This medication may increase your risk to bruise or bleed. Call your care team if you notice any unusual bleeding. Be careful brushing or flossing your teeth or using a toothpick because you may get an infection or bleed more easily. If you have any dental work done, tell your dentist you are receiving  this medication. Talk to your care team if you may be pregnant. Serious birth defects can occur if you take this medication during pregnancy. Talk to your care team before breastfeeding. Changes to your treatment plan may be needed. What side effects may I notice from receiving this medication? Side effects that you should report to your care team as soon as possible: Allergic reactions--skin rash, itching, hives, swelling of the face, lips, tongue, or throat Heart rhythm changes--fast or irregular heartbeat, dizziness, feeling faint or lightheaded, chest pain, trouble breathing Increase in blood pressure Infection--fever, chills, cough, sore throat, wounds that don't heal, pain or trouble when passing urine, general feeling of discomfort or being unwell Low blood pressure--dizziness, feeling faint or lightheaded, blurry vision Low  red blood cell level--unusual weakness or fatigue, dizziness, headache, trouble breathing Painful swelling, warmth, or redness of the skin, blisters or sores at the infusion site Pain, tingling, or numbness in the hands or feet Slow heartbeat--dizziness, feeling faint or lightheaded, confusion, trouble breathing, unusual weakness or fatigue Unusual bruising or bleeding Side effects that usually do not require medical attention (report to your care team if they continue or are bothersome): Diarrhea Hair loss Joint pain Loss of appetite Muscle pain Nausea Vomiting This list may not describe all possible side effects. Call your doctor for medical advice about side effects. You may report side effects to FDA at 1-800-FDA-1088. Where should I keep my medication? This medication is given in a hospital or clinic. It will not be stored at home. NOTE: This sheet is a summary. It may not cover all possible information. If you have questions about this medicine, talk to your doctor, pharmacist, or health care provider.  2023 Elsevier/Gold Standard (2021-07-19 00:00:00)

## 2022-01-18 MED FILL — Dexamethasone Sodium Phosphate Inj 100 MG/10ML: INTRAMUSCULAR | Qty: 1 | Status: AC

## 2022-01-20 NOTE — Progress Notes (Unsigned)
[facility] Follow up:    Patricia Peng, NP [place] [address]   DIAGNOSIS: Cancer Staging  Malignant neoplasm of upper-inner quadrant of left breast in female, estrogen receptor positive ([place]) Staging form: Breast, AJCC 8th Edition - Pathologic: Stage IIA (pT2, pN0, cM0, G3, ER+, PR-, HER2+) - Signed by Benay Pike, MD on 11/26/2021 Histologic grading system: 3 grade system   SUMMARY OF ONCOLOGIC HISTORY: Oncology History  Malignant neoplasm of upper-inner quadrant of left breast in female, estrogen receptor positive ([facility])  09/20/2021 Mammogram   Screening mammogram showed extremely dense breasts and a possible mass in the left breast warranting further evaluation.  Breast density category is D Diagnostic mammogram of the breast showed indeterminate mass in the 11:00 location of the left breast.  Indeterminate mass in the lower anterior left axilla.   10/04/2021 Pathology Results   Pathology showed grade 2 invasive ductal carcinoma, left axillary soft tissue needle core biopsy showed benign breast tissue with patchy changes prognostics from the breast tumor showed ER 90% positive strong staining PR 0%, negative, Ki-67 of 30% and HER2 positive for IHC 3+   10/19/2021 Initial Diagnosis   Malignant neoplasm of upper-inner quadrant of left breast in female, estrogen receptor positive ([facility] [facility])   10/23/2021 Cancer Staging   Staging form: Breast, AJCC 8th Edition - Pathologic: Stage IIA (pT2, pN0, cM0, G3, ER+, PR-, HER2+) - Signed by Benay Pike, MD on 11/26/2021 Histologic grading system: 3 grade system   11/10/2021 Genetic Testing   Negative hereditary cancer genetic testing: no pathogenic variants detected in [facility] CancerNext-Expanded +RNA Panel.  Report date is November 10, 2021.   The CancerNext-Expanded gene panel offered by [facility] and includes sequencing, rearrangement, and RNA analysis for the following 77 genes: AIP, ALK, APC,  ATM, AXIN2, BAP1, BARD1, BLM, BMPR1A, BRCA1, BRCA2, BRIP1, CDC73, CDH1, CDK4, CDKN1B, CDKN2A, CHEK2, CTNNA1, DICER1, FANCC, FH, FLCN, GALNT12, KIF1B, LZTR1, MAX, MEN1, MET, MLH1, MSH2, MSH3, MSH6, MUTYH, NBN, NF1, NF2, NTHL1, PALB2, PHOX2B, PMS2, POT1, PRKAR1A, PTCH1, PTEN, RAD51C, RAD51D, RB1, RECQL, RET, SDHA, SDHAF2, SDHB, SDHC, SDHD, SMAD4, SMARCA4, SMARCB1, SMARCE1, STK11, SUFU, TMEM127, TP53, TSC1, TSC2, VHL and XRCC2 (sequencing and deletion/duplication); EGFR, EGLN1, HOXB13, KIT, MITF, PDGFRA, POLD1, and POLE (sequencing only); EPCAM and GREM1 (deletion/duplication only).    11/15/2021 Surgery   Left breast lumpectomy: 2.4 cm grade 3 invasive ductal carcinoma, margins -2 sentinel lymph nodes negative.   12/10/2021 -  Chemotherapy   Patient is on Treatment Plan : BREAST Paclitaxel + Trastuzumab q7d / Trastuzumab q21d       CURRENT THERAPY: Taxol/Herceptin  INTERVAL HISTORY: Patricia Marshall 76 y.o. female returns for    Patient Active Problem List   Diagnosis Date Noted   Genetic testing 12/07/2021   Breast cancer ([facility]) 11/15/2021   Malignant neoplasm of upper-inner quadrant of left breast in female, estrogen receptor positive ([facility]) 10/19/2021   Piriformis syndrome of left side 07/12/2015   Degenerative arthritis of left knee 07/12/2015   BEE STING REACTION, LOCAL 10/24/2006    is allergic to other, ampicillin, and anesthesia [id number] [propofol].  MEDICAL HISTORY: Past Medical History:  Diagnosis Date   Arthritis    Breast cancer ([facility])    Chicken pox    High cholesterol    PONV (postoperative nausea and vomiting)    Vitamin D deficiency     SURGICAL HISTORY: Past Surgical History:  Procedure Laterality Date   APPENDECTOMY  1959   BACK SURGERY  1996   BREAST LUMPECTOMY WITH AXILLARY LYMPH NODE BIOPSY Left 11/15/2021   Procedure: LEFT BREAST LUMPECTOMY WITH AXILLARY SENTINEL LYMPH NODE BIOPSY;  Surgeon: Rolm Bookbinder, MD;  Location: [facility];   Service: General;  Laterality: Left;   PORTACATH PLACEMENT Right 11/15/2021   Procedure: INSERTION PORT-A-CATH;  Surgeon: Rolm Bookbinder, MD;  Location: [facility];  Service: General;  Laterality: Right;   TONSILLECTOMY AND ADENOIDECTOMY  1954    SOCIAL HISTORY: Social History   Socioeconomic History   Marital status: Married    Spouse name: Not on file   Number of children: Not on file   Years of education: Not on file   Highest education level: Not on file  Occupational History   Not on file  Tobacco Use   Smoking status: Never   Smokeless tobacco: Never  Vaping Use   Vaping Use: Never used  Substance and Sexual Activity   Alcohol use: Yes    Alcohol/week: 1.0 standard drink of alcohol    Types: 1 Glasses of wine per week   Drug use: Never   Sexual activity: Not on file  Other Topics Concern   Not on file  Social History Narrative   Married    Retired    Investment banker, operational of Radio broadcast assistant Strain: [place]  (09/03/2021)   Overall Financial Resource Strain (CARDIA)    Difficulty of Paying Living Expenses: Not hard at all  Food Insecurity: No Food Insecurity (09/03/2021)   Hunger Vital Sign    Worried About Running Out of Food in the Last Year: Never true    [place] in the Last Year: Never true  Transportation Needs: No Transportation Needs (09/03/2021)   PRAPARE - Hydrologist (Medical): No    Lack of Transportation (Non-Medical): No  Physical Activity: Sufficiently Active (09/03/2021)   Exercise Vital Sign    Days of Exercise per Week: 4 days    Minutes of Exercise per Session: 120 min  Stress: No Stress Concern Present (09/03/2021)   [place]    Feeling of Stress : Not at all  Social Connections: [place] (09/03/2021)   Social Connection and Isolation Panel [NHANES]    Frequency of Communication with Friends and  Family: More than three times a week    Frequency of Social Gatherings with Friends and Family: More than three times a week    Attends Religious Services: More than 4 times per year    Active Member of [employer] or Organizations: Yes    Attends Music therapist: More than 4 times per year    Marital Status: Married  Human resources officer Violence: Not At Risk (09/03/2021)   Humiliation, Afraid, Rape, and Kick questionnaire    Fear of Current or Ex-Partner: No    Emotionally Abused: No    Physically Abused: No    Sexually Abused: No    FAMILY HISTORY: Family History  Problem Relation Age of Onset   Stomach cancer Mother 63   Heart attack Father 53   Cancer Sister        dx mid 46s; unknown primary w/ mets; treated like ovarian cancer   Heart attack Brother 25   Heart attack Brother 88    Review of Systems  Constitutional:  Negative for appetite change, chills, fatigue, fever and unexpected weight change.  HENT:   Negative for hearing loss, lump/mass and  trouble swallowing.   Eyes:  Negative for eye problems and icterus.  Respiratory:  Negative for chest tightness, cough and shortness of breath.   Cardiovascular:  Negative for chest pain, leg swelling and palpitations.  Gastrointestinal:  Negative for abdominal distention, abdominal pain, constipation, diarrhea, nausea and vomiting.  Endocrine: Negative for hot flashes.  Genitourinary:  Negative for difficulty urinating.   Musculoskeletal:  Negative for arthralgias.  Skin:  Negative for itching and rash.  Neurological:  Negative for dizziness, extremity weakness, headaches and numbness.  Hematological:  Negative for adenopathy. Does not bruise/bleed easily.  Psychiatric/Behavioral:  Negative for depression. The patient is not nervous/anxious.       PHYSICAL EXAMINATION  ECOG PERFORMANCE STATUS: {CHL ONC ECOG [id number]}  There were no vitals filed for this visit.  Physical Exam Constitutional:      General: She  is not in acute distress.    Appearance: Normal appearance. She is not toxic-appearing.  HENT:     Head: Normocephalic and atraumatic.  Eyes:     General: No scleral icterus. Cardiovascular:     Rate and Rhythm: Normal rate and regular rhythm.     Pulses: Normal pulses.     Heart sounds: Normal heart sounds.  Pulmonary:     Effort: Pulmonary effort is normal.     Breath sounds: Normal breath sounds.  Abdominal:     General: Abdomen is flat. Bowel sounds are normal. There is no distension.     Palpations: Abdomen is soft.     Tenderness: There is no abdominal tenderness.  Musculoskeletal:        General: No swelling.     Cervical back: Neck supple.  Lymphadenopathy:     Cervical: No cervical adenopathy.  Skin:    General: Skin is warm and dry.     Findings: No rash.  Neurological:     General: No focal deficit present.     Mental Status: She is alert.  Psychiatric:        Mood and Affect: Mood normal.        Behavior: Behavior normal.     LABORATORY DATA:  CBC    Component Value Date/Time   WBC 3.7 (L) 01/14/2022 1103   WBC 8.7 11/19/2021 1645   RBC 3.54 (L) 01/14/2022 1103   HGB 10.7 (L) 01/14/2022 1103   HCT 31.5 (L) 01/14/2022 1103   PLT 172 01/14/2022 1103   MCV 89.0 01/14/2022 1103   MCH 30.2 01/14/2022 1103   MCHC 34.0 01/14/2022 1103   RDW 13.5 01/14/2022 1103   LYMPHSABS 1.3 01/14/2022 1103   MONOABS 0.3 01/14/2022 1103   EOSABS 0.1 01/14/2022 1103   BASOSABS 0.1 01/14/2022 1103    CMP     Component Value Date/Time   NA 136 01/14/2022 1103   K 4.3 01/14/2022 1103   CL 103 01/14/2022 1103   CO2 29 01/14/2022 1103   GLUCOSE 96 01/14/2022 1103   BUN 22 01/14/2022 1103   CREATININE 0.60 01/14/2022 1103   CREATININE 0.76 11/03/2019 0921   CALCIUM 9.4 01/14/2022 1103   PROT 6.8 01/14/2022 1103   ALBUMIN 4.1 01/14/2022 1103   AST 18 01/14/2022 1103   ALT 22 01/14/2022 1103   ALKPHOS 61 01/14/2022 1103   BILITOT 0.5 01/14/2022 1103   GFRNONAA  >60 01/14/2022 1103   GFRNONAA 78 11/03/2019 0921   GFRAA 91 11/03/2019 0921     ASSESSMENT and THERAPY PLAN:   No problem-specific Assessment & Plan notes found for this  encounter.    All questions were answered. The patient knows to call the clinic with any problems, questions or concerns. We can certainly see the patient much sooner if necessary.  Total encounter time:*** minutes*in face-to-face visit time, chart review, lab review, care coordination, order entry, and documentation of the encounter time.    Wilber Bihari, NP 01/20/22 8:16 PM Medical Oncology and Hematology [facility] [place], [place] [address] Tel. [phone]    Fax. [phone]  *Total Encounter Time as defined by the Centers for Medicare and Medicaid Services includes, in addition to the face-to-face time of a patient visit (documented in the note above) non-face-to-face time: obtaining and reviewing outside history, ordering and reviewing medications, tests or procedures, care coordination (communications with other health care professionals or caregivers) and documentation in the medical record.

## 2022-01-21 ENCOUNTER — Inpatient Hospital Stay: Payer: Medicare HMO | Attending: Hematology and Oncology | Admitting: Adult Health

## 2022-01-21 ENCOUNTER — Inpatient Hospital Stay: Payer: Medicare HMO

## 2022-01-21 ENCOUNTER — Encounter: Payer: Self-pay | Admitting: Adult Health

## 2022-01-21 ENCOUNTER — Telehealth: Payer: Self-pay | Admitting: Hematology and Oncology

## 2022-01-21 VITALS — BP 134/87 | HR 77 | Resp 16

## 2022-01-21 VITALS — BP 153/73 | HR 72 | Temp 98.0°F | Resp 18 | Ht 65.0 in | Wt 120.4 lb

## 2022-01-21 DIAGNOSIS — C50212 Malignant neoplasm of upper-inner quadrant of left female breast: Secondary | ICD-10-CM | POA: Insufficient documentation

## 2022-01-21 DIAGNOSIS — Z170 Estrogen receptor positive status [ER+]: Secondary | ICD-10-CM | POA: Diagnosis not present

## 2022-01-21 DIAGNOSIS — Z5111 Encounter for antineoplastic chemotherapy: Secondary | ICD-10-CM | POA: Insufficient documentation

## 2022-01-21 DIAGNOSIS — Z79899 Other long term (current) drug therapy: Secondary | ICD-10-CM | POA: Insufficient documentation

## 2022-01-21 LAB — CMP (CANCER CENTER ONLY)
ALT: 19 U/L (ref 0–44)
AST: 18 U/L (ref 15–41)
Albumin: 4.1 g/dL (ref 3.5–5.0)
Alkaline Phosphatase: 64 U/L (ref 38–126)
Anion gap: 4 — ABNORMAL LOW (ref 5–15)
BUN: 21 mg/dL (ref 8–23)
CO2: 28 mmol/L (ref 22–32)
Calcium: 9.2 mg/dL (ref 8.9–10.3)
Chloride: 104 mmol/L (ref 98–111)
Creatinine: 0.58 mg/dL (ref 0.44–1.00)
GFR, Estimated: >60 mL/min (ref >60)
Glucose, Bld: 97 mg/dL (ref 70–99)
Potassium: 4.2 mmol/L (ref 3.5–5.1)
Sodium: 136 mmol/L (ref 135–145)
Total Bilirubin: 0.5 mg/dL (ref 0.3–1.2)
Total Protein: 6.6 g/dL (ref 6.5–8.1)

## 2022-01-21 LAB — CBC WITH DIFFERENTIAL (CANCER CENTER ONLY)
Abs Immature Granulocytes: 0.05 10*3/uL (ref 0.00–0.07)
Basophils Absolute: 0.1 10*3/uL (ref 0.0–0.1)
Basophils Relative: 1 %
Eosinophils Absolute: 0 10*3/uL (ref 0.0–0.5)
Eosinophils Relative: 1 %
HCT: 31.7 % — ABNORMAL LOW (ref 36.0–46.0)
Hemoglobin: 11 g/dL — ABNORMAL LOW (ref 12.0–15.0)
Immature Granulocytes: 1 %
Lymphocytes Relative: 24 %
Lymphs Abs: 1.3 10*3/uL (ref 0.7–4.0)
MCH: 31.2 pg (ref 26.0–34.0)
MCHC: 34.7 g/dL (ref 30.0–36.0)
MCV: 89.8 fL (ref 80.0–100.0)
Monocytes Absolute: 0.5 10*3/uL (ref 0.1–1.0)
Monocytes Relative: 10 %
Neutro Abs: 3.3 10*3/uL (ref 1.7–7.7)
Neutrophils Relative %: 63 %
Platelet Count: 188 10*3/uL (ref 150–400)
RBC: 3.53 MIL/uL — ABNORMAL LOW (ref 3.87–5.11)
RDW: 14.2 % (ref 11.5–15.5)
WBC Count: 5.2 10*3/uL (ref 4.0–10.5)
nRBC: 0 % (ref 0.0–0.2)

## 2022-01-21 MED ORDER — HEPARIN SOD (PORK) LOCK FLUSH 100 UNIT/ML IV SOLN
500.0000 [IU] | Freq: Once | INTRAVENOUS | Status: AC | PRN
  Administered 2022-01-21: 500 [IU]

## 2022-01-21 MED ORDER — SODIUM CHLORIDE 0.9 % IV SOLN: Freq: Once | INTRAVENOUS | Status: AC

## 2022-01-21 MED ORDER — TRASTUZUMAB-DKST CHEMO 150 MG IV SOLR
2.0000 mg/kg | Freq: Once | INTRAVENOUS | Status: AC
  Administered 2022-01-21: 105 mg via INTRAVENOUS
  Filled 2022-01-21: qty 5

## 2022-01-21 MED ORDER — SODIUM CHLORIDE 0.9 % IV SOLN
80.0000 mg/m2 | Freq: Once | INTRAVENOUS | Status: AC
  Administered 2022-01-21: 126 mg via INTRAVENOUS
  Filled 2022-01-21: qty 21

## 2022-01-21 MED ORDER — ACETAMINOPHEN 325 MG PO TABS
650.0000 mg | ORAL_TABLET | Freq: Once | ORAL | Status: AC
  Administered 2022-01-21: 650 mg via ORAL
  Filled 2022-01-21: qty 2

## 2022-01-21 MED ORDER — SODIUM CHLORIDE 0.9 % IV SOLN
10.0000 mg | Freq: Once | INTRAVENOUS | Status: AC
  Administered 2022-01-21: 10 mg via INTRAVENOUS
  Filled 2022-01-21: qty 10

## 2022-01-21 MED ORDER — SODIUM CHLORIDE 0.9% FLUSH
10.0000 mL | Freq: Once | INTRAVENOUS | Status: AC
  Administered 2022-01-21: 10 mL via INTRAVENOUS

## 2022-01-21 MED ORDER — CETIRIZINE HCL 10 MG/ML IV SOLN
10.0000 mg | Freq: Once | INTRAVENOUS | Status: AC
  Administered 2022-01-21: 10 mg via INTRAVENOUS
  Filled 2022-01-21: qty 1

## 2022-01-21 MED ORDER — FAMOTIDINE IN NACL 20-0.9 MG/50ML-% IV SOLN
20.0000 mg | Freq: Once | INTRAVENOUS | Status: AC
  Administered 2022-01-21: 20 mg via INTRAVENOUS
  Filled 2022-01-21: qty 50

## 2022-01-21 MED ORDER — SODIUM CHLORIDE 0.9% FLUSH
10.0000 mL | INTRAVENOUS | Status: DC | PRN
  Administered 2022-01-21: 10 mL

## 2022-01-21 NOTE — Patient Instructions (Signed)
[facility]  Discharge Instructions: Thank you for choosing [facility] to provide your oncology and hematology care.   If you have a lab appointment with the [facility], please go directly to the [facility] and check in at the registration area.   Wear comfortable clothing and clothing appropriate for easy access to any Portacath or PICC line.   We strive to give you quality time with your provider. You may need to reschedule your appointment if you arrive late (15 or more minutes).  Arriving late affects you and other patients whose appointments are after yours.  Also, if you miss three or more appointments without notifying the office, you may be dismissed from the clinic at the provider's discretion.      For prescription refill requests, have your pharmacy contact our office and allow 72 hours for refills to be completed.    Today you received the following chemotherapy and/or immunotherapy agents: Trastuzumab (Ogivri) and Paclitaxel (Taxol)   To help prevent nausea and vomiting after your treatment, we encourage you to take your nausea medication as directed.  BELOW ARE SYMPTOMS THAT SHOULD BE REPORTED IMMEDIATELY: *FEVER GREATER THAN 100.4 F (38 C) OR HIGHER *CHILLS OR SWEATING *NAUSEA AND VOMITING THAT IS NOT CONTROLLED WITH YOUR NAUSEA MEDICATION *UNUSUAL SHORTNESS OF BREATH *UNUSUAL BRUISING OR BLEEDING *URINARY PROBLEMS (pain or burning when urinating, or frequent urination) *BOWEL PROBLEMS (unusual diarrhea, constipation, pain near the anus) TENDERNESS IN MOUTH AND THROAT WITH OR WITHOUT PRESENCE OF ULCERS (sore throat, sores in mouth, or a toothache) UNUSUAL RASH, SWELLING OR PAIN  UNUSUAL VAGINAL DISCHARGE OR ITCHING   Items with * indicate a potential emergency and should be followed up as soon as possible or go to the Emergency Department if any problems should occur.  Please show the CHEMOTHERAPY ALERT CARD or  IMMUNOTHERAPY ALERT CARD at check-in to the Emergency Department and triage nurse.  Should you have questions after your visit or need to cancel or reschedule your appointment, please contact [facility]  Dept: [phone]  and follow the prompts.  Office hours are 8:00 a.m. to 4:30 p.m. Monday - Friday. Please note that voicemails left after 4:00 p.m. may not be returned until the following business day.  We are closed weekends and major holidays. You have access to a nurse at all times for urgent questions. Please call the main number to the clinic Dept: [phone] and follow the prompts.   For any non-urgent questions, you may also contact your provider using MyChart. We now offer e-Visits for anyone 38 and older to request care online for non-urgent symptoms. For details visit [email or web address].   Also download the MyChart app! Go to the app store, search "MyChart", open the app, select [facility], and log in with your MyChart username and password.  Masks are optional in the cancer centers. If you would like for your care team to wear a mask while they are taking care of you, please let them know. You may have one support person who is at least 75 years old accompany you for your appointments. Trastuzumab Injection What is this medication? TRASTUZUMAB (tras TOO zoo mab) treats breast cancer and stomach cancer. It works by blocking a protein that causes cancer cells to grow and multiply. This helps to slow or stop the spread of cancer cells. This medicine may be used for other purposes; ask your health care provider or pharmacist if you have questions.  COMMON BRAND NAME(S): Herceptin, Janae Bridgeman, Ontruzant, Trazimera What should I tell my care team before I take this medication? They need to know if you have any of these conditions: Heart failure Lung disease An unusual or allergic reaction to trastuzumab, other medications, foods, dyes,  or preservatives Pregnant or trying to get pregnant Breast-feeding How should I use this medication? This medication is injected into a vein. It is given by your care team in a hospital or clinic setting. Talk to your care team about the use of this medication in children. It is not approved for use in children. Overdosage: If you think you have taken too much of this medicine contact a poison control center or emergency room at once. NOTE: This medicine is only for you. Do not share this medicine with others. What if I miss a dose? Keep appointments for follow-up doses. It is important not to miss your dose. Call your care team if you are unable to keep an appointment. What may interact with this medication? Certain types of chemotherapy, such as daunorubicin, doxorubicin, epirubicin, idarubicin This list may not describe all possible interactions. Give your health care provider a list of all the medicines, herbs, non-prescription drugs, or dietary supplements you use. Also tell them if you smoke, drink alcohol, or use illegal drugs. Some items may interact with your medicine. What should I watch for while using this medication? Your condition will be monitored carefully while you are receiving this medication. This medication may make you feel generally unwell. This is not uncommon, as chemotherapy affects healthy cells as well as cancer cells. Report any side effects. Continue your course of treatment even though you feel ill unless your care team tells you to stop. This medication may increase your risk of getting an infection. Call your care team for advice if you get a fever, chills, sore throat, or other symptoms of a cold or flu. Do not treat yourself. Try to avoid being around people who are sick. Avoid taking medications that contain aspirin, acetaminophen, ibuprofen, naproxen, or ketoprofen unless instructed by your care team. These medications can hide a fever. Talk to your care team if  you may be pregnant. Serious birth defects can occur if you take this medication during pregnancy and for 7 months after the last dose. You will need a negative pregnancy test before starting this medication. Contraception is recommended while taking this medication and for 7 months after the last dose. Your care team can help you find the option that works for you. Do not breastfeed while taking this medication and for 7 months after stopping treatment. What side effects may I notice from receiving this medication? Side effects that you should report to your care team as soon as possible: Allergic reactions or angioedema--skin rash, itching or hives, swelling of the face, eyes, lips, tongue, arms, or legs, trouble swallowing or breathing Dry cough, shortness of breath or trouble breathing Heart failure--shortness of breath, swelling of the ankles, feet, or hands, sudden weight gain, unusual weakness or fatigue Infection--fever, chills, cough, or sore throat Infusion reactions--chest pain, shortness of breath or trouble breathing, feeling faint or lightheaded Side effects that usually do not require medical attention (report to your care team if they continue or are bothersome): Diarrhea Dizziness Headache Nausea Trouble sleeping Vomiting This list may not describe all possible side effects. Call your doctor for medical advice about side effects. You may report side effects to FDA at 1-800-FDA-1088. Where should I keep my  medication? This medication is given in a hospital or clinic. It will not be stored at home. NOTE: This sheet is a summary. It may not cover all possible information. If you have questions about this medicine, talk to your doctor, pharmacist, or health care provider.  2023 Elsevier/Gold Standard (2021-07-17 00:00:00) Paclitaxel Injection What is this medication? PACLITAXEL (PAK li TAX el) treats some types of cancer. It works by slowing down the growth of cancer cells. This  medicine may be used for other purposes; ask your health care provider or pharmacist if you have questions. COMMON BRAND NAME(S): Onxol, Taxol What should I tell my care team before I take this medication? They need to know if you have any of these conditions: Heart disease Liver disease Low white blood cell levels An unusual or allergic reaction to paclitaxel, other medications, foods, dyes, or preservatives If you or your partner are pregnant or trying to get pregnant Breast-feeding How should I use this medication? This medication is injected into a vein. It is given by your care team in a hospital or clinic setting. Talk to your care team about the use of this medication in children. While it may be given to children for selected conditions, precautions do apply. Overdosage: If you think you have taken too much of this medicine contact a poison control center or emergency room at once. NOTE: This medicine is only for you. Do not share this medicine with others. What if I miss a dose? Keep appointments for follow-up doses. It is important not to miss your dose. Call your care team if you are unable to keep an appointment. What may interact with this medication? Do not take this medication with any of the following: Live virus vaccines Other medications may affect the way this medication works. Talk with your care team about all of the medications you take. They may suggest changes to your treatment plan to lower the risk of side effects and to make sure your medications work as intended. This list may not describe all possible interactions. Give your health care provider a list of all the medicines, herbs, non-prescription drugs, or dietary supplements you use. Also tell them if you smoke, drink alcohol, or use illegal drugs. Some items may interact with your medicine. What should I watch for while using this medication? Your condition will be monitored carefully while you are receiving  this medication. You may need blood work while taking this medication. This medication may make you feel generally unwell. This is not uncommon as chemotherapy can affect healthy cells as well as cancer cells. Report any side effects. Continue your course of treatment even though you feel ill unless your care team tells you to stop. This medication can cause serious allergic reactions. To reduce the risk, your care team may give you other medications to take before receiving this one. Be sure to follow the directions from your care team. This medication may increase your risk of getting an infection. Call your care team for advice if you get a fever, chills, sore throat, or other symptoms of a cold or flu. Do not treat yourself. Try to avoid being around people who are sick. This medication may increase your risk to bruise or bleed. Call your care team if you notice any unusual bleeding. Be careful brushing or flossing your teeth or using a toothpick because you may get an infection or bleed more easily. If you have any dental work done, tell your dentist you are receiving  this medication. Talk to your care team if you may be pregnant. Serious birth defects can occur if you take this medication during pregnancy. Talk to your care team before breastfeeding. Changes to your treatment plan may be needed. What side effects may I notice from receiving this medication? Side effects that you should report to your care team as soon as possible: Allergic reactions--skin rash, itching, hives, swelling of the face, lips, tongue, or throat Heart rhythm changes--fast or irregular heartbeat, dizziness, feeling faint or lightheaded, chest pain, trouble breathing Increase in blood pressure Infection--fever, chills, cough, sore throat, wounds that don't heal, pain or trouble when passing urine, general feeling of discomfort or being unwell Low blood pressure--dizziness, feeling faint or lightheaded, blurry vision Low  red blood cell level--unusual weakness or fatigue, dizziness, headache, trouble breathing Painful swelling, warmth, or redness of the skin, blisters or sores at the infusion site Pain, tingling, or numbness in the hands or feet Slow heartbeat--dizziness, feeling faint or lightheaded, confusion, trouble breathing, unusual weakness or fatigue Unusual bruising or bleeding Side effects that usually do not require medical attention (report to your care team if they continue or are bothersome): Diarrhea Hair loss Joint pain Loss of appetite Muscle pain Nausea Vomiting This list may not describe all possible side effects. Call your doctor for medical advice about side effects. You may report side effects to FDA at 1-800-FDA-1088. Where should I keep my medication? This medication is given in a hospital or clinic. It will not be stored at home. NOTE: This sheet is a summary. It may not cover all possible information. If you have questions about this medicine, talk to your doctor, pharmacist, or health care provider.  2023 Elsevier/Gold Standard (2021-07-19 00:00:00)

## 2022-01-21 NOTE — Telephone Encounter (Signed)
Called patient to inform of upcoming November appointments. Left voicemail.

## 2022-01-21 NOTE — Assessment & Plan Note (Signed)
Patricia Marshall is a 76 year old woman with history of stage IIa estrogen positive HER2 positive breast cancer status postlumpectomy currently on adjuvant treatment with Taxol and Herceptin.  Today Patricia Marshall will proceed with week 7 of Taxol Herceptin.  She has grade 1 neuropathy in just the first 2 digits of her right fingers.  We will proceed with treatment for now though she may require a dose reduction in the future.  Her labs are stable and I reviewed them with her in detail.  She has a mild anemia that we are monitoring that is likely chemo related.  The skin dryness can be a side effect from the chemotherapy, I recommended that she continue to use moisturizing cream.  She will return in 1 week for labs, follow-up with Dr. Chryl Heck, and her next treatment.

## 2022-01-25 MED FILL — Dexamethasone Sodium Phosphate Inj 100 MG/10ML: INTRAMUSCULAR | Qty: 1 | Status: AC

## 2022-01-28 ENCOUNTER — Inpatient Hospital Stay: Payer: Medicare HMO

## 2022-01-28 ENCOUNTER — Inpatient Hospital Stay: Payer: Medicare HMO | Admitting: Hematology and Oncology

## 2022-01-28 ENCOUNTER — Other Ambulatory Visit: Payer: Self-pay | Admitting: *Deleted

## 2022-01-28 DIAGNOSIS — Z79899 Other long term (current) drug therapy: Secondary | ICD-10-CM | POA: Diagnosis not present

## 2022-01-28 DIAGNOSIS — C50212 Malignant neoplasm of upper-inner quadrant of left female breast: Secondary | ICD-10-CM

## 2022-01-28 DIAGNOSIS — Z170 Estrogen receptor positive status [ER+]: Secondary | ICD-10-CM | POA: Diagnosis not present

## 2022-01-28 DIAGNOSIS — Z5111 Encounter for antineoplastic chemotherapy: Secondary | ICD-10-CM | POA: Diagnosis not present

## 2022-01-28 DIAGNOSIS — Z5181 Encounter for therapeutic drug level monitoring: Secondary | ICD-10-CM

## 2022-01-28 LAB — CMP (CANCER CENTER ONLY)
ALT: 23 U/L (ref 0–44)
AST: 20 U/L (ref 15–41)
Albumin: 4.3 g/dL (ref 3.5–5.0)
Alkaline Phosphatase: 60 U/L (ref 38–126)
Anion gap: 4 — ABNORMAL LOW (ref 5–15)
BUN: 20 mg/dL (ref 8–23)
CO2: 28 mmol/L (ref 22–32)
Calcium: 9.4 mg/dL (ref 8.9–10.3)
Chloride: 103 mmol/L (ref 98–111)
Creatinine: 0.58 mg/dL (ref 0.44–1.00)
GFR, Estimated: >60 mL/min (ref >60)
Glucose, Bld: 99 mg/dL (ref 70–99)
Potassium: 4.3 mmol/L (ref 3.5–5.1)
Sodium: 135 mmol/L (ref 135–145)
Total Bilirubin: 0.4 mg/dL (ref 0.3–1.2)
Total Protein: 6.9 g/dL (ref 6.5–8.1)

## 2022-01-28 LAB — CBC WITH DIFFERENTIAL (CANCER CENTER ONLY)
Abs Immature Granulocytes: 0.04 10*3/uL (ref 0.00–0.07)
Basophils Absolute: 0.1 10*3/uL (ref 0.0–0.1)
Basophils Relative: 1 %
Eosinophils Absolute: 0.1 10*3/uL (ref 0.0–0.5)
Eosinophils Relative: 1 %
HCT: 32.2 % — ABNORMAL LOW (ref 36.0–46.0)
Hemoglobin: 11 g/dL — ABNORMAL LOW (ref 12.0–15.0)
Immature Granulocytes: 1 %
Lymphocytes Relative: 28 %
Lymphs Abs: 1.5 10*3/uL (ref 0.7–4.0)
MCH: 31.1 pg (ref 26.0–34.0)
MCHC: 34.2 g/dL (ref 30.0–36.0)
MCV: 91 fL (ref 80.0–100.0)
Monocytes Absolute: 0.5 10*3/uL (ref 0.1–1.0)
Monocytes Relative: 9 %
Neutro Abs: 3.1 10*3/uL (ref 1.7–7.7)
Neutrophils Relative %: 60 %
Platelet Count: 189 10*3/uL (ref 150–400)
RBC: 3.54 MIL/uL — ABNORMAL LOW (ref 3.87–5.11)
RDW: 14.6 % (ref 11.5–15.5)
WBC Count: 5.2 10*3/uL (ref 4.0–10.5)
nRBC: 0 % (ref 0.0–0.2)

## 2022-01-28 MED ORDER — HEPARIN SOD (PORK) LOCK FLUSH 100 UNIT/ML IV SOLN
500.0000 [IU] | Freq: Once | INTRAVENOUS | Status: AC | PRN
  Administered 2022-01-28: 500 [IU]

## 2022-01-28 MED ORDER — TRASTUZUMAB-DKST CHEMO 150 MG IV SOLR
2.0000 mg/kg | Freq: Once | INTRAVENOUS | Status: AC
  Administered 2022-01-28: 105 mg via INTRAVENOUS
  Filled 2022-01-28: qty 5

## 2022-01-28 MED ORDER — FAMOTIDINE IN NACL 20-0.9 MG/50ML-% IV SOLN
20.0000 mg | Freq: Once | INTRAVENOUS | Status: AC
  Administered 2022-01-28: 20 mg via INTRAVENOUS
  Filled 2022-01-28: qty 50

## 2022-01-28 MED ORDER — SODIUM CHLORIDE 0.9 % IV SOLN
80.0000 mg/m2 | Freq: Once | INTRAVENOUS | Status: AC
  Administered 2022-01-28: 126 mg via INTRAVENOUS
  Filled 2022-01-28: qty 21

## 2022-01-28 MED ORDER — ACETAMINOPHEN 325 MG PO TABS
650.0000 mg | ORAL_TABLET | Freq: Once | ORAL | Status: AC
  Administered 2022-01-28: 650 mg via ORAL
  Filled 2022-01-28: qty 2

## 2022-01-28 MED ORDER — SODIUM CHLORIDE 0.9% FLUSH
10.0000 mL | INTRAVENOUS | Status: DC | PRN
  Administered 2022-01-28: 10 mL

## 2022-01-28 MED ORDER — SODIUM CHLORIDE 0.9 % IV SOLN
10.0000 mg | Freq: Once | INTRAVENOUS | Status: AC
  Administered 2022-01-28: 10 mg via INTRAVENOUS
  Filled 2022-01-28: qty 10

## 2022-01-28 MED ORDER — CETIRIZINE HCL 10 MG/ML IV SOLN
10.0000 mg | Freq: Once | INTRAVENOUS | Status: AC
  Administered 2022-01-28: 10 mg via INTRAVENOUS
  Filled 2022-01-28: qty 1

## 2022-01-28 MED ORDER — SODIUM CHLORIDE 0.9 % IV SOLN: Freq: Once | INTRAVENOUS | Status: AC

## 2022-01-28 NOTE — Progress Notes (Unsigned)
[facility] NOTE  Patient Care Team: Dorothyann Peng, NP as PCP - General (Family Medicine) Rolm Bookbinder, MD as Consulting Physician (General Surgery) Benay Pike, MD as Consulting Physician (Hematology and Oncology) Kyung Rudd, MD as Consulting Physician (Radiation Oncology) Rockwell Germany, RN as Oncology Nurse Navigator Mauro Kaufmann, RN as Oncology Nurse Navigator  CHIEF COMPLAINTS/PURPOSE OF CONSULTATION:  Newly diagnosed breast cancer  HISTORY OF PRESENTING ILLNESS:   Patricia Marshall 76 y.o. female is here because of recent diagnosis of left breast cancer.  I reviewed her records extensively and collaborated the history with the patient.  SUMMARY OF ONCOLOGIC HISTORY: Oncology History  Malignant neoplasm of upper-inner quadrant of left breast in female, estrogen receptor positive ([place])  09/20/2021 Mammogram   Screening mammogram showed extremely dense breasts and a possible mass in the left breast warranting further evaluation.  Breast density category is D Diagnostic mammogram of the breast showed indeterminate mass in the 11:00 location of the left breast.  Indeterminate mass in the lower anterior left axilla.   10/04/2021 Pathology Results   Pathology showed grade 2 invasive ductal carcinoma, left axillary soft tissue needle core biopsy showed benign breast tissue with patchy changes prognostics from the breast tumor showed ER 90% positive strong staining PR 0%, negative, Ki-67 of 30% and HER2 positive for IHC 3+   10/19/2021 Initial Diagnosis   Malignant neoplasm of upper-inner quadrant of left breast in female, estrogen receptor positive ([place])   10/23/2021 Cancer Staging   Staging form: Breast, AJCC 8th Edition - Pathologic: Stage IIA (pT2, pN0, cM0, G3, ER+, PR-, HER2+) - Signed by Benay Pike, MD on 11/26/2021 Histologic grading system: 3 grade system   11/10/2021 Genetic Testing   Negative hereditary cancer genetic testing: no  pathogenic variants detected in [facility] CancerNext-Expanded +RNA Panel.  Report date is November 10, 2021.   The CancerNext-Expanded gene panel offered by [facility] and includes sequencing, rearrangement, and RNA analysis for the following 77 genes: AIP, ALK, APC, ATM, AXIN2, BAP1, BARD1, BLM, BMPR1A, BRCA1, BRCA2, BRIP1, CDC73, CDH1, CDK4, CDKN1B, CDKN2A, CHEK2, CTNNA1, DICER1, FANCC, FH, FLCN, GALNT12, KIF1B, LZTR1, MAX, MEN1, MET, MLH1, MSH2, MSH3, MSH6, MUTYH, NBN, NF1, NF2, NTHL1, PALB2, PHOX2B, PMS2, POT1, PRKAR1A, PTCH1, PTEN, RAD51C, RAD51D, RB1, RECQL, RET, SDHA, SDHAF2, SDHB, SDHC, SDHD, SMAD4, SMARCA4, SMARCB1, SMARCE1, STK11, SUFU, TMEM127, TP53, TSC1, TSC2, VHL and XRCC2 (sequencing and deletion/duplication); EGFR, EGLN1, HOXB13, KIT, MITF, PDGFRA, POLD1, and POLE (sequencing only); EPCAM and GREM1 (deletion/duplication only).    11/15/2021 Surgery   Left breast lumpectomy: 2.4 cm grade 3 invasive ductal carcinoma, margins -2 sentinel lymph nodes negative.   12/10/2021 -  Chemotherapy   Patient is on Treatment Plan : BREAST Paclitaxel + Trastuzumab q7d / Trastuzumab q21d      Interval history  Patricia Marshall is here for follow-up.  She is doing quite well. She complains of some post nasal drip and cough resulting from it. Very mild tingling, intermittent, not bothersome at all. She lost all her hair Mild skin rash or sensitivity noted on the fore arms. No nausea, vomiting or diarrhea. She remains anxious about having to do radiation with herceptin, had several questions written down again. Rest of the pertinent 10 point ROS reviewed and negative  MEDICAL HISTORY:  Past Medical History:  Diagnosis Date   Arthritis    Breast cancer ([place])    Chicken pox    High cholesterol    PONV (postoperative nausea and vomiting)  Vitamin D deficiency     SURGICAL HISTORY: Past Surgical History:  Procedure Laterality Date   [place]   BREAST  LUMPECTOMY WITH AXILLARY LYMPH NODE BIOPSY Left 11/15/2021   Procedure: LEFT BREAST LUMPECTOMY WITH AXILLARY SENTINEL LYMPH NODE BIOPSY;  Surgeon: Rolm Bookbinder, MD;  Location: [facility];  Service: General;  Laterality: Left;   PORTACATH PLACEMENT Right 11/15/2021   Procedure: INSERTION PORT-A-CATH;  Surgeon: Rolm Bookbinder, MD;  Location: [facility];  Service: General;  Laterality: Right;   TONSILLECTOMY AND ADENOIDECTOMY  1953    SOCIAL HISTORY: Social History   Socioeconomic History   Marital status: Married    Spouse name: Not on file   Number of children: Not on file   Years of education: Not on file   Highest education level: Not on file  Occupational History   Not on file  Tobacco Use   Smoking status: Never   Smokeless tobacco: Never  Vaping Use   Vaping Use: Never used  Substance and Sexual Activity   Alcohol use: Yes    Alcohol/week: 1.0 standard drink of alcohol    Types: 1 Glasses of wine per week   Drug use: Never   Sexual activity: Not on file  Other Topics Concern   Not on file  Social History Narrative   Married    Retired    Investment banker, operational of Radio broadcast assistant Strain: [place]  (09/03/2021)   Overall Financial Resource Strain (CARDIA)    Difficulty of Paying Living Expenses: Not hard at all  Food Insecurity: No Food Insecurity (09/03/2021)   Hunger Vital Sign    Worried About Running Out of Food in the Last Year: Never true    [place] in the Last Year: Never true  Transportation Needs: No Transportation Needs (09/03/2021)   PRAPARE - Hydrologist (Medical): No    Lack of Transportation (Non-Medical): No  Physical Activity: Sufficiently Active (09/03/2021)   Exercise Vital Sign    Days of Exercise per Week: 4 days    Minutes of Exercise per Session: 120 min  Stress: No Stress Concern Present (09/03/2021)   [place]    Feeling of Stress : Not at all  Social Connections: [place] (09/03/2021)   Social Connection and Isolation Panel [NHANES]    Frequency of Communication with Friends and Family: More than three times a week    Frequency of Social Gatherings with Friends and Family: More than three times a week    Attends Religious Services: More than 4 times per year    Active Member of [employer] or Organizations: Yes    Attends Music therapist: More than 4 times per year    Marital Status: Married  Human resources officer Violence: Not At Risk (09/03/2021)   Humiliation, Afraid, Rape, and Kick questionnaire    Fear of Current or Ex-Partner: No    Emotionally Abused: No    Physically Abused: No    Sexually Abused: No    FAMILY HISTORY: Family History  Problem Relation Age of Onset   Stomach cancer Mother 90   Heart attack Father 73   Cancer Sister        dx mid 61s; unknown primary w/ mets; treated like ovarian cancer   Heart attack Brother 45   Heart attack Brother 69  ALLERGIES:  is allergic to other, ampicillin, and anesthesia [id number] [propofol].  MEDICATIONS:  Current Outpatient Medications  Medication Sig Dispense Refill   apixaban (ELIQUIS) 5 MG TABS tablet Take 1 tablet (5 mg total) by mouth 2 (two) times daily. 60 tablet 3   Cholecalciferol (VITAMIN D) 50 MCG (2000 UT) tablet Take 2,000 Units by mouth daily.     lidocaine-prilocaine (EMLA) cream Apply to affected area once 30 g 3   ondansetron (ZOFRAN) 4 MG tablet Take 1 tablet (4 mg total) by mouth daily as needed for nausea or vomiting. 10 tablet 0   ondansetron (ZOFRAN) 8 MG tablet Take 1 tablet (8 mg total) by mouth every 8 (eight) hours as needed for nausea or vomiting. 30 tablet 1   prochlorperazine (COMPAZINE) 10 MG tablet Take 1 tablet (10 mg total) by mouth every 6 (six) hours as needed for nausea or vomiting. 30 tablet 1   traMADol (ULTRAM) 50 MG tablet Take 1 tablet (50 mg total) by mouth  every 6 (six) hours as needed. 10 tablet 0   No current facility-administered medications for this visit.    REVIEW OF SYSTEMS:   Constitutional: Denies fevers, chills or abnormal night sweats Eyes: Denies blurriness of vision, double vision or watery eyes Ears, nose, mouth, throat, and face: Denies mucositis or sore throat Respiratory: Denies cough, dyspnea or wheezes Cardiovascular: Denies palpitation, chest discomfort or lower extremity swelling Gastrointestinal:  Denies nausea, heartburn or change in bowel habits Skin: Denies abnormal skin rashes Lymphatics: Denies new lymphadenopathy or easy bruising Neurological:Denies numbness, tingling or new weaknesses Behavioral/Psych: Mood is stable, no new changes  Breast: Denies any palpable lumps or discharge All other systems were reviewed with the patient and are negative.  PHYSICAL EXAMINATION: ECOG PERFORMANCE STATUS: 0 - Asymptomatic  Vitals:   01/28/22 1200  BP: (!) 162/72  Pulse: 73  Resp: 16  Temp: (!) 97.5 F (36.4 C)  SpO2: 100%    Filed Weights   01/28/22 1200  Weight: 120 lb 8 oz (54.7 kg)    Physical Exam Constitutional:      Appearance: Normal appearance. She is normal weight.  Cardiovascular:     Rate and Rhythm: Normal rate and regular rhythm.     Pulses: Normal pulses.     Heart sounds: Normal heart sounds.  Pulmonary:     Effort: Pulmonary effort is normal.     Breath sounds: Normal breath sounds.  Musculoskeletal:        General: No swelling or tenderness.     Cervical back: Normal range of motion and neck supple. No rigidity.  Lymphadenopathy:     Cervical: No cervical adenopathy.  Skin:    General: Skin is warm and dry.  Neurological:     General: No focal deficit present.     Mental Status: She is alert.  Psychiatric:        Mood and Affect: Mood normal.    LABORATORY DATA:  I have reviewed the data as listed Lab Results  Component Value Date   WBC 5.2 01/28/2022   HGB 11.0 (L)  01/28/2022   HCT 32.2 (L) 01/28/2022   MCV 91.0 01/28/2022   PLT 189 01/28/2022   Lab Results  Component Value Date   NA 135 01/28/2022   K 4.3 01/28/2022   CL 103 01/28/2022   CO2 28 01/28/2022    RADIOGRAPHIC STUDIES: I have personally reviewed the radiological reports and agreed with the findings in the report.  ASSESSMENT AND PLAN:  Malignant neoplasm of upper-inner quadrant of left breast in female, estrogen receptor positive ([place]) This is a very pleasant 76 year old postmenopausal female patient with newly diagnosed left breast upper inner quadrant invasive ductal carcinoma, grade 2, ER +90% strong staining, PR 0% negative, HER2 positive by IHC 3+, Ki-67 of 30% referred to breast [place] for additional recommendations.   She is tolerating adjuvant chemotherapy very well.  No dose-limiting toxicity.  Physical examination today without any concerns.   She will complete adjuvant Taxol and Herceptin followed by adjuvant Herceptin.  Okay to overlap radiation along with Herceptin.  She understands that this is pretty standard of care. She once again today expresses concerns about this approach After she completes radiation, we will initiate anti estrogen therapy She had several questions about radiation, impact of radiation on heart, encouraged to discuss with rad onc. Return to clinic per integrated scheduling.   Thank you for consulting [place] in the care of this patient.  Please do not hesitate to contact us with any additional questions or concerns.    ECHO due now, this has been ordered but not scheduled.  Total time spent: 30 minutes including history, physical exam, review of records, counseling and coordination of care All questions were answered. The patient knows to call the clinic with any problems, questions or concerns.    Benay Pike, MD 01/30/22

## 2022-01-28 NOTE — Patient Instructions (Signed)
[facility] ONCOLOGY  Discharge Instructions: Thank you for choosing [facility] to provide your oncology and hematology care.   If you have a lab appointment with the [facility], please go directly to the [facility] and check in at the registration area.   Wear comfortable clothing and clothing appropriate for easy access to any Portacath or PICC line.   We strive to give you quality time with your provider. You may need to reschedule your appointment if you arrive late (15 or more minutes).  Arriving late affects you and other patients whose appointments are after yours.  Also, if you miss three or more appointments without notifying the office, you may be dismissed from the clinic at the provider's discretion.      For prescription refill requests, have your pharmacy contact our office and allow 72 hours for refills to be completed.    Today you received the following chemotherapy and/or immunotherapy agents herceptin, paclitaxel      To help prevent nausea and vomiting after your treatment, we encourage you to take your nausea medication as directed.  BELOW ARE SYMPTOMS THAT SHOULD BE REPORTED IMMEDIATELY: *FEVER GREATER THAN 100.4 F (38 C) OR HIGHER *CHILLS OR SWEATING *NAUSEA AND VOMITING THAT IS NOT CONTROLLED WITH YOUR NAUSEA MEDICATION *UNUSUAL SHORTNESS OF BREATH *UNUSUAL BRUISING OR BLEEDING *URINARY PROBLEMS (pain or burning when urinating, or frequent urination) *BOWEL PROBLEMS (unusual diarrhea, constipation, pain near the anus) TENDERNESS IN MOUTH AND THROAT WITH OR WITHOUT PRESENCE OF ULCERS (sore throat, sores in mouth, or a toothache) UNUSUAL RASH, SWELLING OR PAIN  UNUSUAL VAGINAL DISCHARGE OR ITCHING   Items with * indicate a potential emergency and should be followed up as soon as possible or go to the Emergency Department if any problems should occur.  Please show the CHEMOTHERAPY ALERT CARD or IMMUNOTHERAPY ALERT CARD at  check-in to the Emergency Department and triage nurse.  Should you have questions after your visit or need to cancel or reschedule your appointment, please contact [facility]  Dept: [phone]  and follow the prompts.  Office hours are 8:00 a.m. to 4:30 p.m. Monday - Friday. Please note that voicemails left after 4:00 p.m. may not be returned until the following business day.  We are closed weekends and major holidays. You have access to a nurse at all times for urgent questions. Please call the main number to the clinic Dept: [phone] and follow the prompts.   For any non-urgent questions, you may also contact your provider using MyChart. We now offer e-Visits for anyone 31 and older to request care online for non-urgent symptoms. For details visit [email or web address].   Also download the MyChart app! Go to the app store, search "MyChart", open the app, select [facility], and log in with your MyChart username and password.  Masks are optional in the cancer centers. If you would like for your care team to wear a mask while they are taking care of you, please let them know. You may have one support person who is at least 75 years old accompany you for your appointments.

## 2022-01-29 ENCOUNTER — Encounter: Payer: Self-pay | Admitting: General Practice

## 2022-01-29 ENCOUNTER — Other Ambulatory Visit: Payer: Self-pay | Admitting: Pharmacist

## 2022-01-29 NOTE — Progress Notes (Signed)
[facility] Spiritual Care Note  Reached Ms Gandolfi by phone per referral from nursing for support related to anxiety upon arrival for treatment. Ms Cowing was very receptive of call. She reports great support from her faith, husband, two nieces who are also dealing with breast cancer, and pickleball friends. Overall Ms Urbanek reports that she is coping well with her diagnosis and treatment, thanks to that support and also her experience of manageable side effects (less intense than she feared).   Provided empathic listening, emotional support, normalization of feelings, and reminder of [facility] and [facility]. Ms Krist has participated in cooking class and nutrition 101, and has interest in Finding Your New Normal [facility]) after she completes treatment.   We plan to follow up in ca two weeks in infusion in order to meet in person, and she has direct Spiritual Care number in case needs arise at other times, as well.    [place], [place], [facility] Pager [phone] Voicemail [phone]

## 2022-01-30 ENCOUNTER — Encounter: Payer: Self-pay | Admitting: Hematology and Oncology

## 2022-01-30 NOTE — Assessment & Plan Note (Signed)
This is a very pleasant 76 year old postmenopausal female patient with newly diagnosed left breast upper inner quadrant invasive ductal carcinoma, grade 2, ER +90% strong staining, PR 0% negative, HER2 positive by IHC 3+, Ki-67 of 30% referred to breast [place] for additional recommendations.   She is tolerating adjuvant chemotherapy very well.  No dose-limiting toxicity.  Physical examination today without any concerns.   She will complete adjuvant Taxol and Herceptin followed by adjuvant Herceptin.  Okay to overlap radiation along with Herceptin.  She understands that this is pretty standard of care. She once again today expresses concerns about this approach After she completes radiation, we will initiate anti estrogen therapy She had several questions about radiation, impact of radiation on heart, encouraged to discuss with rad onc. Return to clinic per integrated scheduling.   Thank you for consulting [place] in the care of this patient.  Please do not hesitate to contact us with any additional questions or concerns.

## 2022-02-01 ENCOUNTER — Ambulatory Visit (HOSPITAL_COMMUNITY)
Admission: RE | Admit: 2022-02-01 | Discharge: 2022-02-01 | Disposition: A | Discharge status: Home / Self Care | Payer: Medicare HMO | Source: Ambulatory Visit | Attending: Hematology and Oncology | Admitting: Hematology and Oncology

## 2022-02-01 DIAGNOSIS — Z5181 Encounter for therapeutic drug level monitoring: Secondary | ICD-10-CM | POA: Diagnosis not present

## 2022-02-01 DIAGNOSIS — Z0189 Encounter for other specified special examinations: Secondary | ICD-10-CM

## 2022-02-01 DIAGNOSIS — C50212 Malignant neoplasm of upper-inner quadrant of left female breast: Secondary | ICD-10-CM

## 2022-02-01 DIAGNOSIS — Z79899 Other long term (current) drug therapy: Secondary | ICD-10-CM

## 2022-02-01 DIAGNOSIS — Z170 Estrogen receptor positive status [ER+]: Secondary | ICD-10-CM | POA: Diagnosis not present

## 2022-02-01 LAB — ECHOCARDIOGRAM COMPLETE
Area-P 1/2: 3.77 cm2
Calc EF: 66 %
S' Lateral: 2.7 cm
Single Plane A2C EF: 61.2 %
Single Plane A4C EF: 66.1 %

## 2022-02-01 MED FILL — Dexamethasone Sodium Phosphate Inj 100 MG/10ML: INTRAMUSCULAR | Qty: 1 | Status: AC

## 2022-02-01 NOTE — Progress Notes (Signed)
  Echocardiogram 2D Echocardiogram has been performed.  Patricia Marshall 02/01/2022, 9:47 AM

## 2022-02-04 ENCOUNTER — Encounter: Payer: Self-pay | Admitting: Hematology and Oncology

## 2022-02-04 ENCOUNTER — Inpatient Hospital Stay: Payer: Medicare HMO

## 2022-02-04 VITALS — BP 142/79 | HR 72 | Temp 98.0°F | Resp 17 | Wt 121.2 lb

## 2022-02-04 DIAGNOSIS — Z170 Estrogen receptor positive status [ER+]: Secondary | ICD-10-CM

## 2022-02-04 DIAGNOSIS — Z79899 Other long term (current) drug therapy: Secondary | ICD-10-CM | POA: Diagnosis not present

## 2022-02-04 DIAGNOSIS — Z5111 Encounter for antineoplastic chemotherapy: Secondary | ICD-10-CM | POA: Diagnosis not present

## 2022-02-04 DIAGNOSIS — C50212 Malignant neoplasm of upper-inner quadrant of left female breast: Secondary | ICD-10-CM | POA: Diagnosis not present

## 2022-02-04 DIAGNOSIS — Z95828 Presence of other vascular implants and grafts: Secondary | ICD-10-CM

## 2022-02-04 LAB — CBC WITH DIFFERENTIAL (CANCER CENTER ONLY)
Abs Immature Granulocytes: 0.04 10*3/uL (ref 0.00–0.07)
Basophils Absolute: 0.1 10*3/uL (ref 0.0–0.1)
Basophils Relative: 1 %
Eosinophils Absolute: 0.1 10*3/uL (ref 0.0–0.5)
Eosinophils Relative: 1 %
HCT: 32.1 % — ABNORMAL LOW (ref 36.0–46.0)
Hemoglobin: 10.9 g/dL — ABNORMAL LOW (ref 12.0–15.0)
Immature Granulocytes: 1 %
Lymphocytes Relative: 31 %
Lymphs Abs: 1.6 10*3/uL (ref 0.7–4.0)
MCH: 30.7 pg (ref 26.0–34.0)
MCHC: 34 g/dL (ref 30.0–36.0)
MCV: 90.4 fL (ref 80.0–100.0)
Monocytes Absolute: 0.5 10*3/uL (ref 0.1–1.0)
Monocytes Relative: 9 %
Neutro Abs: 3 10*3/uL (ref 1.7–7.7)
Neutrophils Relative %: 57 %
Platelet Count: 190 10*3/uL (ref 150–400)
RBC: 3.55 MIL/uL — ABNORMAL LOW (ref 3.87–5.11)
RDW: 14.8 % (ref 11.5–15.5)
WBC Count: 5.3 10*3/uL (ref 4.0–10.5)
nRBC: 0 % (ref 0.0–0.2)

## 2022-02-04 LAB — CMP (CANCER CENTER ONLY)
ALT: 20 U/L (ref 0–44)
AST: 19 U/L (ref 15–41)
Albumin: 4.3 g/dL (ref 3.5–5.0)
Alkaline Phosphatase: 61 U/L (ref 38–126)
Anion gap: 4 — ABNORMAL LOW (ref 5–15)
BUN: 23 mg/dL (ref 8–23)
CO2: 28 mmol/L (ref 22–32)
Calcium: 9.6 mg/dL (ref 8.9–10.3)
Chloride: 103 mmol/L (ref 98–111)
Creatinine: 0.58 mg/dL (ref 0.44–1.00)
GFR, Estimated: >60 mL/min (ref >60)
Glucose, Bld: 106 mg/dL — ABNORMAL HIGH (ref 70–99)
Potassium: 4.4 mmol/L (ref 3.5–5.1)
Sodium: 135 mmol/L (ref 135–145)
Total Bilirubin: 0.4 mg/dL (ref 0.3–1.2)
Total Protein: 6.8 g/dL (ref 6.5–8.1)

## 2022-02-04 MED ORDER — FAMOTIDINE IN NACL 20-0.9 MG/50ML-% IV SOLN
20.0000 mg | Freq: Once | INTRAVENOUS | Status: AC
  Administered 2022-02-04: 20 mg via INTRAVENOUS
  Filled 2022-02-04: qty 50

## 2022-02-04 MED ORDER — SODIUM CHLORIDE 0.9% FLUSH
10.0000 mL | INTRAVENOUS | Status: DC | PRN
  Administered 2022-02-04: 10 mL

## 2022-02-04 MED ORDER — SODIUM CHLORIDE 0.9 % IV SOLN
10.0000 mg | Freq: Once | INTRAVENOUS | Status: AC
  Administered 2022-02-04: 10 mg via INTRAVENOUS
  Filled 2022-02-04: qty 10

## 2022-02-04 MED ORDER — TRASTUZUMAB-DKST CHEMO 150 MG IV SOLR
2.0000 mg/kg | Freq: Once | INTRAVENOUS | Status: AC
  Administered 2022-02-04: 105 mg via INTRAVENOUS
  Filled 2022-02-04: qty 5

## 2022-02-04 MED ORDER — SODIUM CHLORIDE 0.9 % IV SOLN: Freq: Once | INTRAVENOUS | Status: AC

## 2022-02-04 MED ORDER — ACETAMINOPHEN 325 MG PO TABS
650.0000 mg | ORAL_TABLET | Freq: Once | ORAL | Status: AC
  Administered 2022-02-04: 650 mg via ORAL
  Filled 2022-02-04 (×2): qty 2

## 2022-02-04 MED ORDER — CETIRIZINE HCL 10 MG/ML IV SOLN
10.0000 mg | Freq: Once | INTRAVENOUS | Status: AC
  Administered 2022-02-04: 10 mg via INTRAVENOUS
  Filled 2022-02-04: qty 1

## 2022-02-04 MED ORDER — SODIUM CHLORIDE 0.9 % IV SOLN
80.0000 mg/m2 | Freq: Once | INTRAVENOUS | Status: AC
  Administered 2022-02-04: 126 mg via INTRAVENOUS
  Filled 2022-02-04: qty 21

## 2022-02-04 MED ORDER — HEPARIN SOD (PORK) LOCK FLUSH 100 UNIT/ML IV SOLN
500.0000 [IU] | Freq: Once | INTRAVENOUS | Status: AC | PRN
  Administered 2022-02-04: 500 [IU]

## 2022-02-04 MED ORDER — SODIUM CHLORIDE 0.9% FLUSH
10.0000 mL | Freq: Once | INTRAVENOUS | Status: AC
  Administered 2022-02-04: 10 mL

## 2022-02-04 NOTE — Patient Instructions (Signed)
[facility] ONCOLOGY  Discharge Instructions: Thank you for choosing [facility] to provide your oncology and hematology care.   If you have a lab appointment with the [facility], please go directly to the [facility] and check in at the registration area.   Wear comfortable clothing and clothing appropriate for easy access to any Portacath or PICC line.   We strive to give you quality time with your provider. You may need to reschedule your appointment if you arrive late (15 or more minutes).  Arriving late affects you and other patients whose appointments are after yours.  Also, if you miss three or more appointments without notifying the office, you may be dismissed from the clinic at the provider's discretion.      For prescription refill requests, have your pharmacy contact our office and allow 72 hours for refills to be completed.    Today you received the following chemotherapy and/or immunotherapy agents herceptin, paclitaxel      To help prevent nausea and vomiting after your treatment, we encourage you to take your nausea medication as directed.  BELOW ARE SYMPTOMS THAT SHOULD BE REPORTED IMMEDIATELY: *FEVER GREATER THAN 100.4 F (38 C) OR HIGHER *CHILLS OR SWEATING *NAUSEA AND VOMITING THAT IS NOT CONTROLLED WITH YOUR NAUSEA MEDICATION *UNUSUAL SHORTNESS OF BREATH *UNUSUAL BRUISING OR BLEEDING *URINARY PROBLEMS (pain or burning when urinating, or frequent urination) *BOWEL PROBLEMS (unusual diarrhea, constipation, pain near the anus) TENDERNESS IN MOUTH AND THROAT WITH OR WITHOUT PRESENCE OF ULCERS (sore throat, sores in mouth, or a toothache) UNUSUAL RASH, SWELLING OR PAIN  UNUSUAL VAGINAL DISCHARGE OR ITCHING   Items with * indicate a potential emergency and should be followed up as soon as possible or go to the Emergency Department if any problems should occur.  Please show the CHEMOTHERAPY ALERT CARD or IMMUNOTHERAPY ALERT CARD at  check-in to the Emergency Department and triage nurse.  Should you have questions after your visit or need to cancel or reschedule your appointment, please contact [facility]  Dept: [phone]  and follow the prompts.  Office hours are 8:00 a.m. to 4:30 p.m. Monday - Friday. Please note that voicemails left after 4:00 p.m. may not be returned until the following business day.  We are closed weekends and major holidays. You have access to a nurse at all times for urgent questions. Please call the main number to the clinic Dept: [phone] and follow the prompts.   For any non-urgent questions, you may also contact your provider using MyChart. We now offer e-Visits for anyone 64 and older to request care online for non-urgent symptoms. For details visit [email or web address].   Also download the MyChart app! Go to the app store, search "MyChart", open the app, select [facility], and log in with your MyChart username and password.  Masks are optional in the cancer centers. If you would like for your care team to wear a mask while they are taking care of you, please let them know. You may have one support person who is at least 75 years old accompany you for your appointments.

## 2022-02-08 MED FILL — Dexamethasone Sodium Phosphate Inj 100 MG/10ML: INTRAMUSCULAR | Qty: 1 | Status: AC

## 2022-02-11 ENCOUNTER — Inpatient Hospital Stay: Payer: Medicare HMO | Admitting: Hematology and Oncology

## 2022-02-11 ENCOUNTER — Other Ambulatory Visit: Payer: Self-pay

## 2022-02-11 ENCOUNTER — Encounter: Payer: Self-pay | Admitting: Hematology and Oncology

## 2022-02-11 ENCOUNTER — Inpatient Hospital Stay: Payer: Medicare HMO

## 2022-02-11 ENCOUNTER — Encounter: Payer: Self-pay | Admitting: General Practice

## 2022-02-11 VITALS — BP 133/76 | HR 75 | Resp 16

## 2022-02-11 DIAGNOSIS — C50212 Malignant neoplasm of upper-inner quadrant of left female breast: Secondary | ICD-10-CM | POA: Diagnosis not present

## 2022-02-11 DIAGNOSIS — Z79899 Other long term (current) drug therapy: Secondary | ICD-10-CM | POA: Diagnosis not present

## 2022-02-11 DIAGNOSIS — Z170 Estrogen receptor positive status [ER+]: Secondary | ICD-10-CM

## 2022-02-11 DIAGNOSIS — Z5111 Encounter for antineoplastic chemotherapy: Secondary | ICD-10-CM | POA: Diagnosis not present

## 2022-02-11 DIAGNOSIS — Z95828 Presence of other vascular implants and grafts: Secondary | ICD-10-CM

## 2022-02-11 LAB — CBC WITH DIFFERENTIAL (CANCER CENTER ONLY)
Abs Immature Granulocytes: 0.03 10*3/uL (ref 0.00–0.07)
Basophils Absolute: 0.1 10*3/uL (ref 0.0–0.1)
Basophils Relative: 1 %
Eosinophils Absolute: 0.1 10*3/uL (ref 0.0–0.5)
Eosinophils Relative: 1 %
HCT: 32.1 % — ABNORMAL LOW (ref 36.0–46.0)
Hemoglobin: 11 g/dL — ABNORMAL LOW (ref 12.0–15.0)
Immature Granulocytes: 1 %
Lymphocytes Relative: 27 %
Lymphs Abs: 1.4 10*3/uL (ref 0.7–4.0)
MCH: 31.8 pg (ref 26.0–34.0)
MCHC: 34.3 g/dL (ref 30.0–36.0)
MCV: 92.8 fL (ref 80.0–100.0)
Monocytes Absolute: 0.5 10*3/uL (ref 0.1–1.0)
Monocytes Relative: 10 %
Neutro Abs: 3.1 10*3/uL (ref 1.7–7.7)
Neutrophils Relative %: 60 %
Platelet Count: 195 10*3/uL (ref 150–400)
RBC: 3.46 MIL/uL — ABNORMAL LOW (ref 3.87–5.11)
RDW: 15.1 % (ref 11.5–15.5)
WBC Count: 5.1 10*3/uL (ref 4.0–10.5)
nRBC: 0 % (ref 0.0–0.2)

## 2022-02-11 LAB — CMP (CANCER CENTER ONLY)
ALT: 19 U/L (ref 0–44)
AST: 19 U/L (ref 15–41)
Albumin: 4.3 g/dL (ref 3.5–5.0)
Alkaline Phosphatase: 63 U/L (ref 38–126)
Anion gap: 4 — ABNORMAL LOW (ref 5–15)
BUN: 18 mg/dL (ref 8–23)
CO2: 27 mmol/L (ref 22–32)
Calcium: 9.5 mg/dL (ref 8.9–10.3)
Chloride: 105 mmol/L (ref 98–111)
Creatinine: 0.62 mg/dL (ref 0.44–1.00)
GFR, Estimated: >60 mL/min (ref >60)
Glucose, Bld: 100 mg/dL — ABNORMAL HIGH (ref 70–99)
Potassium: 4.2 mmol/L (ref 3.5–5.1)
Sodium: 136 mmol/L (ref 135–145)
Total Bilirubin: 0.4 mg/dL (ref 0.3–1.2)
Total Protein: 6.7 g/dL (ref 6.5–8.1)

## 2022-02-11 MED ORDER — ACETAMINOPHEN 325 MG PO TABS
ORAL_TABLET | ORAL | Status: AC
  Filled 2022-02-11: qty 2

## 2022-02-11 MED ORDER — TRASTUZUMAB-DKST CHEMO 150 MG IV SOLR
2.0000 mg/kg | Freq: Once | INTRAVENOUS | Status: AC
  Administered 2022-02-11: 105 mg via INTRAVENOUS
  Filled 2022-02-11: qty 5

## 2022-02-11 MED ORDER — ACETAMINOPHEN 325 MG PO TABS
650.0000 mg | ORAL_TABLET | Freq: Once | ORAL | Status: AC
  Administered 2022-02-11: 650 mg via ORAL
  Filled 2022-02-11: qty 2

## 2022-02-11 MED ORDER — SODIUM CHLORIDE 0.9% FLUSH
10.0000 mL | Freq: Once | INTRAVENOUS | Status: AC
  Administered 2022-02-11: 10 mL

## 2022-02-11 MED ORDER — SODIUM CHLORIDE 0.9 % IV SOLN: Freq: Once | INTRAVENOUS | Status: AC

## 2022-02-11 MED ORDER — HEPARIN SOD (PORK) LOCK FLUSH 100 UNIT/ML IV SOLN
500.0000 [IU] | Freq: Once | INTRAVENOUS | Status: AC | PRN
  Administered 2022-02-11: 500 [IU]

## 2022-02-11 MED ORDER — SODIUM CHLORIDE 0.9 % IV SOLN
10.0000 mg | Freq: Once | INTRAVENOUS | Status: AC
  Administered 2022-02-11: 10 mg via INTRAVENOUS
  Filled 2022-02-11: qty 10

## 2022-02-11 MED ORDER — FAMOTIDINE IN NACL 20-0.9 MG/50ML-% IV SOLN
20.0000 mg | Freq: Once | INTRAVENOUS | Status: AC
  Administered 2022-02-11: 20 mg via INTRAVENOUS
  Filled 2022-02-11: qty 50

## 2022-02-11 MED ORDER — CETIRIZINE HCL 10 MG/ML IV SOLN
10.0000 mg | Freq: Once | INTRAVENOUS | Status: AC
  Administered 2022-02-11: 10 mg via INTRAVENOUS
  Filled 2022-02-11: qty 1

## 2022-02-11 MED ORDER — SODIUM CHLORIDE 0.9% FLUSH
10.0000 mL | INTRAVENOUS | Status: DC | PRN
  Administered 2022-02-11: 10 mL

## 2022-02-11 MED ORDER — SODIUM CHLORIDE 0.9 % IV SOLN
60.0000 mg/m2 | Freq: Once | INTRAVENOUS | Status: AC
  Administered 2022-02-11: 96 mg via INTRAVENOUS
  Filled 2022-02-11: qty 16

## 2022-02-11 NOTE — Assessment & Plan Note (Signed)
This is a very pleasant 76 year old postmenopausal female patient with newly diagnosed left breast upper inner quadrant invasive ductal carcinoma, grade 2, ER +90% strong staining, PR 0% negative, HER2 positive by IHC 3+, Ki-67 of 30% referred to breast [facility] for additional recommendations.   She is tolerating adjuvant chemotherapy very well except for mildly worsening neuropathy. We will dose reduce the taxol to 60 mg/m2 given worsening neuropathy. If she has persistent neuropathy despite dose reduction, we will omit the last 2 planned doses of Taxol and continue Herceptin alone. No concerning findings on physical examination today.  Acneform rash noted on scalp likely from Taxol.  We can continue to monitor this. Persistent left lower extremity swelling from DVT, she will continue anticoagulation for total of 6 months.  She understands that sometimes patients may have persistent lower extremity swelling despite anticoagulation and it may take several months to year for improvement. Thank you for consulting [place] in the care of this patient.  Please do not hesitate to contact us with any additional questions or concerns.

## 2022-02-11 NOTE — Patient Instructions (Signed)
[facility] ONCOLOGY  Discharge Instructions: Thank you for choosing [facility] to provide your oncology and hematology care.   If you have a lab appointment with the [facility], please go directly to the [facility] and check in at the registration area.   Wear comfortable clothing and clothing appropriate for easy access to any Portacath or PICC line.   We strive to give you quality time with your provider. You may need to reschedule your appointment if you arrive late (15 or more minutes).  Arriving late affects you and other patients whose appointments are after yours.  Also, if you miss three or more appointments without notifying the office, you may be dismissed from the clinic at the provider's discretion.      For prescription refill requests, have your pharmacy contact our office and allow 72 hours for refills to be completed.    Today you received the following chemotherapy and/or immunotherapy agents : trastuzumab, Paclitaxel      To help prevent nausea and vomiting after your treatment, we encourage you to take your nausea medication as directed.  BELOW ARE SYMPTOMS THAT SHOULD BE REPORTED IMMEDIATELY: *FEVER GREATER THAN 100.4 F (38 C) OR HIGHER *CHILLS OR SWEATING *NAUSEA AND VOMITING THAT IS NOT CONTROLLED WITH YOUR NAUSEA MEDICATION *UNUSUAL SHORTNESS OF BREATH *UNUSUAL BRUISING OR BLEEDING *URINARY PROBLEMS (pain or burning when urinating, or frequent urination) *BOWEL PROBLEMS (unusual diarrhea, constipation, pain near the anus) TENDERNESS IN MOUTH AND THROAT WITH OR WITHOUT PRESENCE OF ULCERS (sore throat, sores in mouth, or a toothache) UNUSUAL RASH, SWELLING OR PAIN  UNUSUAL VAGINAL DISCHARGE OR ITCHING   Items with * indicate a potential emergency and should be followed up as soon as possible or go to the Emergency Department if any problems should occur.  Please show the CHEMOTHERAPY ALERT CARD or IMMUNOTHERAPY ALERT CARD  at check-in to the Emergency Department and triage nurse.  Should you have questions after your visit or need to cancel or reschedule your appointment, please contact [facility]  Dept: [phone]  and follow the prompts.  Office hours are 8:00 a.m. to 4:30 p.m. Monday - Friday. Please note that voicemails left after 4:00 p.m. may not be returned until the following business day.  We are closed weekends and major holidays. You have access to a nurse at all times for urgent questions. Please call the main number to the clinic Dept: [phone] and follow the prompts.   For any non-urgent questions, you may also contact your provider using MyChart. We now offer e-Visits for anyone 91 and older to request care online for non-urgent symptoms. For details visit [email or web address].   Also download the MyChart app! Go to the app store, search "MyChart", open the app, select [facility], and log in with your MyChart username and password.  Masks are optional in the cancer centers. If you would like for your care team to wear a mask while they are taking care of you, please let them know. You may have one support person who is at least 75 years old accompany you for your appointments.

## 2022-02-11 NOTE — Progress Notes (Signed)
[facility] NOTE  Patient Care Team: Dorothyann Peng, NP as PCP - General (Family Medicine) Rolm Bookbinder, MD as Consulting Physician (General Surgery) Benay Pike, MD as Consulting Physician (Hematology and Oncology) Kyung Rudd, MD as Consulting Physician (Radiation Oncology) Rockwell Germany, RN as Oncology Nurse Navigator Mauro Kaufmann, RN as Oncology Nurse Navigator  CHIEF COMPLAINTS/PURPOSE OF CONSULTATION:    HISTORY OF PRESENTING ILLNESS:   Patricia Marshall 76 y.o. female is here because of recent diagnosis of left breast cancer.  I reviewed her records extensively and collaborated the history with the patient.  SUMMARY OF ONCOLOGIC HISTORY: Oncology History  Malignant neoplasm of upper-inner quadrant of left breast in female, estrogen receptor positive ([place])  09/20/2021 Mammogram   Screening mammogram showed extremely dense breasts and a possible mass in the left breast warranting further evaluation.  Breast density category is D Diagnostic mammogram of the breast showed indeterminate mass in the 11:00 location of the left breast.  Indeterminate mass in the lower anterior left axilla.   10/04/2021 Pathology Results   Pathology showed grade 2 invasive ductal carcinoma, left axillary soft tissue needle core biopsy showed benign breast tissue with patchy changes prognostics from the breast tumor showed ER 90% positive strong staining PR 0%, negative, Ki-67 of 30% and HER2 positive for IHC 3+   10/19/2021 Initial Diagnosis   Malignant neoplasm of upper-inner quadrant of left breast in female, estrogen receptor positive ([place])   10/23/2021 Cancer Staging   Staging form: Breast, AJCC 8th Edition - Pathologic: Stage IIA (pT2, pN0, cM0, G3, ER+, PR-, HER2+) - Signed by Benay Pike, MD on 11/26/2021 Histologic grading system: 3 grade system   11/10/2021 Genetic Testing   Negative hereditary cancer genetic testing: no pathogenic variants detected in  [facility] CancerNext-Expanded +RNA Panel.  Report date is November 10, 2021.   The CancerNext-Expanded gene panel offered by [facility] and includes sequencing, rearrangement, and RNA analysis for the following 77 genes: AIP, ALK, APC, ATM, AXIN2, BAP1, BARD1, BLM, BMPR1A, BRCA1, BRCA2, BRIP1, CDC73, CDH1, CDK4, CDKN1B, CDKN2A, CHEK2, CTNNA1, DICER1, FANCC, FH, FLCN, GALNT12, KIF1B, LZTR1, MAX, MEN1, MET, MLH1, MSH2, MSH3, MSH6, MUTYH, NBN, NF1, NF2, NTHL1, PALB2, PHOX2B, PMS2, POT1, PRKAR1A, PTCH1, PTEN, RAD51C, RAD51D, RB1, RECQL, RET, SDHA, SDHAF2, SDHB, SDHC, SDHD, SMAD4, SMARCA4, SMARCB1, SMARCE1, STK11, SUFU, TMEM127, TP53, TSC1, TSC2, VHL and XRCC2 (sequencing and deletion/duplication); EGFR, EGLN1, HOXB13, KIT, MITF, PDGFRA, POLD1, and POLE (sequencing only); EPCAM and GREM1 (deletion/duplication only).    11/15/2021 Surgery   Left breast lumpectomy: 2.4 cm grade 3 invasive ductal carcinoma, margins -2 sentinel lymph nodes negative.   12/10/2021 -  Chemotherapy   Patient is on Treatment Plan : BREAST Paclitaxel + Trastuzumab q7d / Trastuzumab q21d      Interval history  Patricia Marshall is here for follow-up.  She is doing quite well except for worsening tingling and numbness in her hands and her feet.  She also dropped a few dishes this past week because of the loss of sensitivity at the tips of her fingers.  Other than the neuropathy, she has noticed some rash on her scalp.  She also mentions that her hands become suddenly red for a few seconds.  During her last infusion, she appears to have had a subconjunctival hemorrhage which resolved in 4 days. Rest of the pertinent 10 point ROS reviewed and negative  MEDICAL HISTORY:  Past Medical History:  Diagnosis Date   Arthritis    Breast cancer ([place])  Chicken pox    High cholesterol    PONV (postoperative nausea and vomiting)    Vitamin D deficiency     SURGICAL HISTORY: Past Surgical History:  Procedure Laterality Date   [place]   BREAST LUMPECTOMY WITH AXILLARY LYMPH NODE BIOPSY Left 11/15/2021   Procedure: LEFT BREAST LUMPECTOMY WITH AXILLARY SENTINEL LYMPH NODE BIOPSY;  Surgeon: Rolm Bookbinder, MD;  Location: [facility];  Service: General;  Laterality: Left;   PORTACATH PLACEMENT Right 11/15/2021   Procedure: INSERTION PORT-A-CATH;  Surgeon: Rolm Bookbinder, MD;  Location: [facility];  Service: General;  Laterality: Right;   TONSILLECTOMY AND ADENOIDECTOMY  1953    SOCIAL HISTORY: Social History   Socioeconomic History   Marital status: Married    Spouse name: Not on file   Number of children: Not on file   Years of education: Not on file   Highest education level: Not on file  Occupational History   Not on file  Tobacco Use   Smoking status: Never   Smokeless tobacco: Never  Vaping Use   Vaping Use: Never used  Substance and Sexual Activity   Alcohol use: Yes    Alcohol/week: 1.0 standard drink of alcohol    Types: 1 Glasses of wine per week   Drug use: Never   Sexual activity: Not on file  Other Topics Concern   Not on file  Social History Narrative   Married    Retired    Investment banker, operational of Radio broadcast assistant Strain: [place]  (09/03/2021)   Overall Financial Resource Strain (CARDIA)    Difficulty of Paying Living Expenses: Not hard at all  Food Insecurity: No Food Insecurity (09/03/2021)   Hunger Vital Sign    Worried About Running Out of Food in the Last Year: Never true    [place] in the Last Year: Never true  Transportation Needs: No Transportation Needs (09/03/2021)   PRAPARE - Hydrologist (Medical): No    Lack of Transportation (Non-Medical): No  Physical Activity: Sufficiently Active (09/03/2021)   Exercise Vital Sign    Days of Exercise per Week: 4 days    Minutes of Exercise per Session: 120 min  Stress: No Stress Concern Present (09/03/2021)   [place]    Feeling of Stress : Not at all  Social Connections: [place] (09/03/2021)   Social Connection and Isolation Panel [NHANES]    Frequency of Communication with Friends and Family: More than three times a week    Frequency of Social Gatherings with Friends and Family: More than three times a week    Attends Religious Services: More than 4 times per year    Active Member of [employer] or Organizations: Yes    Attends Music therapist: More than 4 times per year    Marital Status: Married  Human resources officer Violence: Not At Risk (09/03/2021)   Humiliation, Afraid, Rape, and Kick questionnaire    Fear of Current or Ex-Partner: No    Emotionally Abused: No    Physically Abused: No    Sexually Abused: No    FAMILY HISTORY: Family History  Problem Relation Age of Onset   Stomach cancer Mother 58   Heart attack Father 38   Cancer Sister        dx mid 27s; unknown primary w/  mets; treated like ovarian cancer   Heart attack Brother 81   Heart attack Brother 40    ALLERGIES:  is allergic to other, ampicillin, and anesthesia [id number] [propofol].  MEDICATIONS:  Current Outpatient Medications  Medication Sig Dispense Refill   apixaban (ELIQUIS) 5 MG TABS tablet Take 1 tablet (5 mg total) by mouth 2 (two) times daily. 60 tablet 3   Cholecalciferol (VITAMIN D) 50 MCG (2000 UT) tablet Take 2,000 Units by mouth daily.     lidocaine-prilocaine (EMLA) cream Apply to affected area once 30 g 3   ondansetron (ZOFRAN) 4 MG tablet Take 1 tablet (4 mg total) by mouth daily as needed for nausea or vomiting. 10 tablet 0   ondansetron (ZOFRAN) 8 MG tablet Take 1 tablet (8 mg total) by mouth every 8 (eight) hours as needed for nausea or vomiting. 30 tablet 1   prochlorperazine (COMPAZINE) 10 MG tablet Take 1 tablet (10 mg total) by mouth every 6 (six) hours as needed for nausea or vomiting. 30 tablet 1   traMADol (ULTRAM) 50 MG  tablet Take 1 tablet (50 mg total) by mouth every 6 (six) hours as needed. 10 tablet 0   No current facility-administered medications for this visit.    REVIEW OF SYSTEMS:   Constitutional: Denies fevers, chills or abnormal night sweats Eyes: Denies blurriness of vision, double vision or watery eyes Ears, nose, mouth, throat, and face: Denies mucositis or sore throat Respiratory: Denies cough, dyspnea or wheezes Cardiovascular: Denies palpitation, chest discomfort or lower extremity swelling Gastrointestinal:  Denies nausea, heartburn or change in bowel habits Skin: Denies abnormal skin rashes Lymphatics: Denies new lymphadenopathy or easy bruising Neurological:Denies numbness, tingling or new weaknesses Behavioral/Psych: Mood is stable, no new changes  Breast: Denies any palpable lumps or discharge All other systems were reviewed with the patient and are negative.  PHYSICAL EXAMINATION: ECOG PERFORMANCE STATUS: 0 - Asymptomatic  Vitals:   02/11/22 1138  BP: (!) 167/68  Pulse: 74  Resp: 16  Temp: 97.6 F (36.4 C)  SpO2: 100%    Filed Weights   02/11/22 1138  Weight: 121 lb 8 oz (55.1 kg)    Physical Exam Constitutional:      Appearance: Normal appearance. She is normal weight.  Cardiovascular:     Rate and Rhythm: Normal rate and regular rhythm.     Pulses: Normal pulses.     Heart sounds: Normal heart sounds.  Pulmonary:     Effort: Pulmonary effort is normal.     Breath sounds: Normal breath sounds.  Chest:     Comments: Port site appears well  Musculoskeletal:        General: No swelling or tenderness.     Cervical back: Normal range of motion and neck supple. No rigidity.  Lymphadenopathy:     Cervical: No cervical adenopathy.  Skin:    General: Skin is warm and dry.  Neurological:     General: No focal deficit present.     Mental Status: She is alert.  Psychiatric:        Mood and Affect: Mood normal.    LABORATORY DATA:  I have reviewed the data  as listed Lab Results  Component Value Date   WBC 5.1 02/11/2022   HGB 11.0 (L) 02/11/2022   HCT 32.1 (L) 02/11/2022   MCV 92.8 02/11/2022   PLT 195 02/11/2022   Lab Results  Component Value Date   NA 136 02/11/2022   K 4.2 02/11/2022   CL 105  02/11/2022   CO2 27 02/11/2022    RADIOGRAPHIC STUDIES: I have personally reviewed the radiological reports and agreed with the findings in the report.  ASSESSMENT AND PLAN:  Malignant neoplasm of upper-inner quadrant of left breast in female, estrogen receptor positive ([place]) This is a very pleasant 76 year old postmenopausal female patient with newly diagnosed left breast upper inner quadrant invasive ductal carcinoma, grade 2, ER +90% strong staining, PR 0% negative, HER2 positive by IHC 3+, Ki-67 of 30% referred to breast [facility] for additional recommendations.   She is tolerating adjuvant chemotherapy very well except for mildly worsening neuropathy. We will dose reduce the taxol to 60 mg/m2 given worsening neuropathy. If she has persistent neuropathy despite dose reduction, we will omit the last 2 planned doses of Taxol and continue Herceptin alone. No concerning findings on physical examination today.  Acneform rash noted on scalp likely from Taxol.  We can continue to monitor this. Persistent left lower extremity swelling from DVT, she will continue anticoagulation for total of 6 months.  She understands that sometimes patients may have persistent lower extremity swelling despite anticoagulation and it may take several months to year for improvement. Thank you for consulting [place] in the care of this patient.  Please do not hesitate to contact us with any additional questions or concerns.   Most recent echocardiogram with LVEF of 60 to 65% no concerns  Total time spent: 30 minutes including history, physical exam, review of records, counseling and coordination of care All questions were answered. The patient knows to call the clinic with any  problems, questions or concerns.    Benay Pike, MD 02/11/22

## 2022-02-11 NOTE — Progress Notes (Signed)
[facility] Spiritual Care Note  Followed up with Patricia Patricia Marshall in infusion as planned, bringing updated [facility] and [employer] support programming information.  Patricia Marshall continues to find faith, perspective, and family support very helpful in coping. She is aware of ongoing Spiritual Care availability and plans to reach out as needed/desired for follow-up support.   [place], [place], [facility] Pager [phone] Voicemail [phone]

## 2022-02-15 MED FILL — Dexamethasone Sodium Phosphate Inj 100 MG/10ML: INTRAMUSCULAR | Qty: 1 | Status: AC

## 2022-02-18 ENCOUNTER — Encounter: Payer: Self-pay | Admitting: Hematology and Oncology

## 2022-02-18 ENCOUNTER — Other Ambulatory Visit: Payer: Self-pay

## 2022-02-18 ENCOUNTER — Inpatient Hospital Stay: Payer: Medicare HMO

## 2022-02-18 ENCOUNTER — Inpatient Hospital Stay: Payer: Medicare HMO | Attending: Hematology and Oncology

## 2022-02-18 ENCOUNTER — Inpatient Hospital Stay: Payer: Medicare HMO | Admitting: Hematology and Oncology

## 2022-02-18 DIAGNOSIS — Z5112 Encounter for antineoplastic immunotherapy: Secondary | ICD-10-CM | POA: Diagnosis not present

## 2022-02-18 DIAGNOSIS — Z5111 Encounter for antineoplastic chemotherapy: Secondary | ICD-10-CM | POA: Diagnosis present

## 2022-02-18 DIAGNOSIS — C50212 Malignant neoplasm of upper-inner quadrant of left female breast: Secondary | ICD-10-CM | POA: Diagnosis not present

## 2022-02-18 DIAGNOSIS — R42 Dizziness and giddiness: Secondary | ICD-10-CM | POA: Insufficient documentation

## 2022-02-18 DIAGNOSIS — Z95828 Presence of other vascular implants and grafts: Secondary | ICD-10-CM

## 2022-02-18 DIAGNOSIS — Z86718 Personal history of other venous thrombosis and embolism: Secondary | ICD-10-CM | POA: Insufficient documentation

## 2022-02-18 DIAGNOSIS — Z170 Estrogen receptor positive status [ER+]: Secondary | ICD-10-CM

## 2022-02-18 DIAGNOSIS — Z7901 Long term (current) use of anticoagulants: Secondary | ICD-10-CM | POA: Insufficient documentation

## 2022-02-18 DIAGNOSIS — G629 Polyneuropathy, unspecified: Secondary | ICD-10-CM | POA: Diagnosis not present

## 2022-02-18 LAB — CMP (CANCER CENTER ONLY)
ALT: 13 U/L (ref 0–44)
AST: 15 U/L (ref 15–41)
Albumin: 4.2 g/dL (ref 3.5–5.0)
Alkaline Phosphatase: 55 U/L (ref 38–126)
Anion gap: 5 (ref 5–15)
BUN: 18 mg/dL (ref 8–23)
CO2: 28 mmol/L (ref 22–32)
Calcium: 9.5 mg/dL (ref 8.9–10.3)
Chloride: 104 mmol/L (ref 98–111)
Creatinine: 0.56 mg/dL (ref 0.44–1.00)
GFR, Estimated: >60 mL/min (ref >60)
Glucose, Bld: 104 mg/dL — ABNORMAL HIGH (ref 70–99)
Potassium: 4.1 mmol/L (ref 3.5–5.1)
Sodium: 137 mmol/L (ref 135–145)
Total Bilirubin: 0.4 mg/dL (ref 0.3–1.2)
Total Protein: 6.5 g/dL (ref 6.5–8.1)

## 2022-02-18 LAB — CBC WITH DIFFERENTIAL (CANCER CENTER ONLY)
Abs Immature Granulocytes: 0.03 10*3/uL (ref 0.00–0.07)
Basophils Absolute: 0.1 10*3/uL (ref 0.0–0.1)
Basophils Relative: 1 %
Eosinophils Absolute: 0.1 10*3/uL (ref 0.0–0.5)
Eosinophils Relative: 1 %
HCT: 31.4 % — ABNORMAL LOW (ref 36.0–46.0)
Hemoglobin: 10.8 g/dL — ABNORMAL LOW (ref 12.0–15.0)
Immature Granulocytes: 1 %
Lymphocytes Relative: 25 %
Lymphs Abs: 1.2 10*3/uL (ref 0.7–4.0)
MCH: 31.5 pg (ref 26.0–34.0)
MCHC: 34.4 g/dL (ref 30.0–36.0)
MCV: 91.5 fL (ref 80.0–100.0)
Monocytes Absolute: 0.6 10*3/uL (ref 0.1–1.0)
Monocytes Relative: 12 %
Neutro Abs: 3 10*3/uL (ref 1.7–7.7)
Neutrophils Relative %: 60 %
Platelet Count: 202 10*3/uL (ref 150–400)
RBC: 3.43 MIL/uL — ABNORMAL LOW (ref 3.87–5.11)
RDW: 15.3 % (ref 11.5–15.5)
WBC Count: 4.9 10*3/uL (ref 4.0–10.5)
nRBC: 0 % (ref 0.0–0.2)

## 2022-02-18 IMAGING — MG MM DIGITAL SCREENING BILAT W/ TOMO AND CAD
6 of 10 series · 6 of 30 positions shown · non-contrast
Comparison: Previous exam(s).

CLINICAL DATA: Screening.

EXAM:
DIGITAL SCREENING BILATERAL MAMMOGRAM WITH TOMOSYNTHESIS AND CAD
TECHNIQUE: Bilateral screening digital craniocaudal and mediolateral oblique
mammograms were obtained. Bilateral screening digital breast
tomosynthesis was performed. The images were evaluated with
computer-aided detection.

[L CC synth-2D]
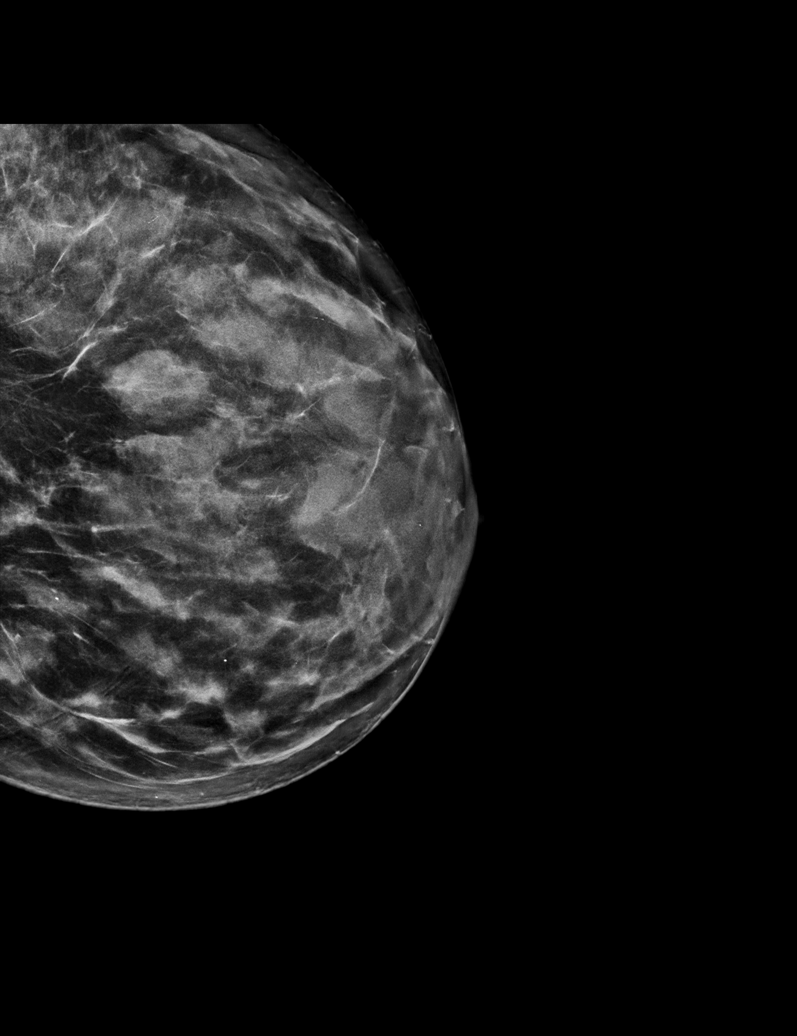

[L MLO synth-2D]
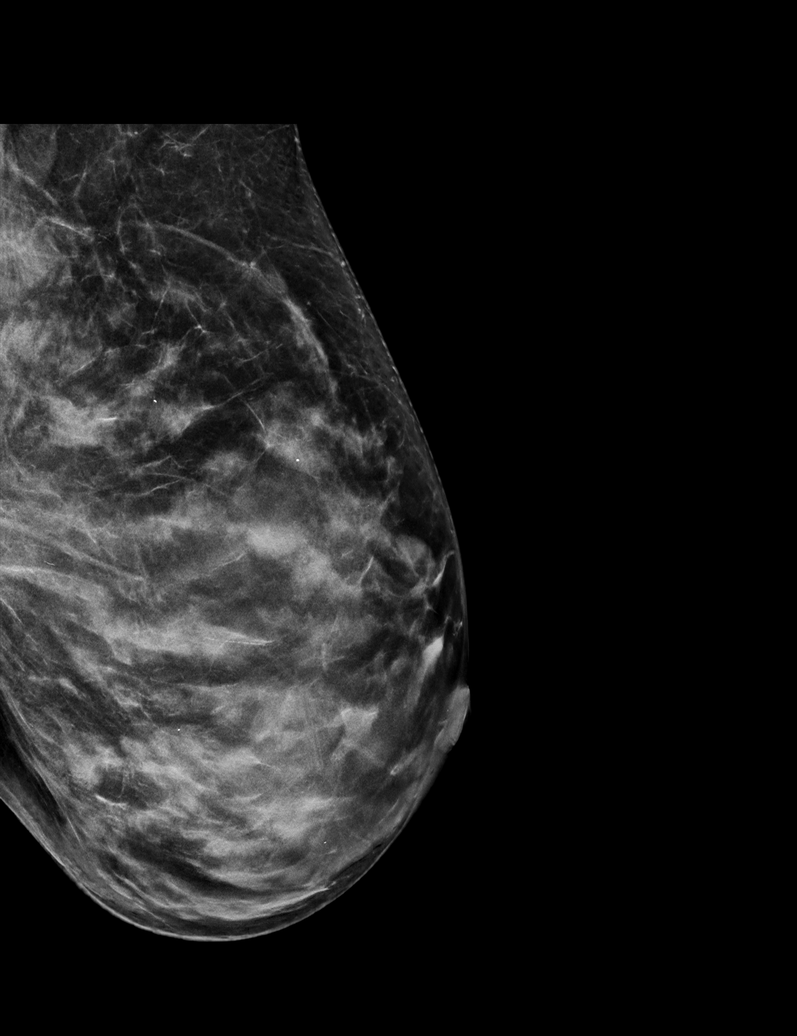

[R CC synth-2D (1 of 2)]
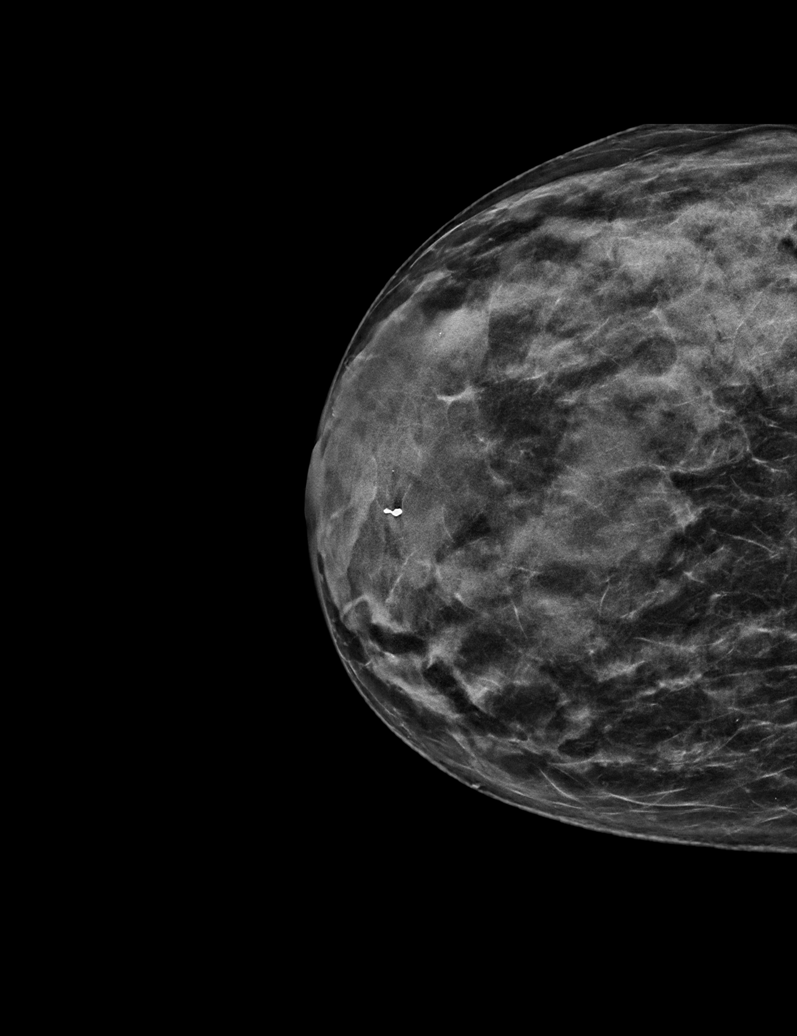

[R CC synth-2D (2 of 2)]
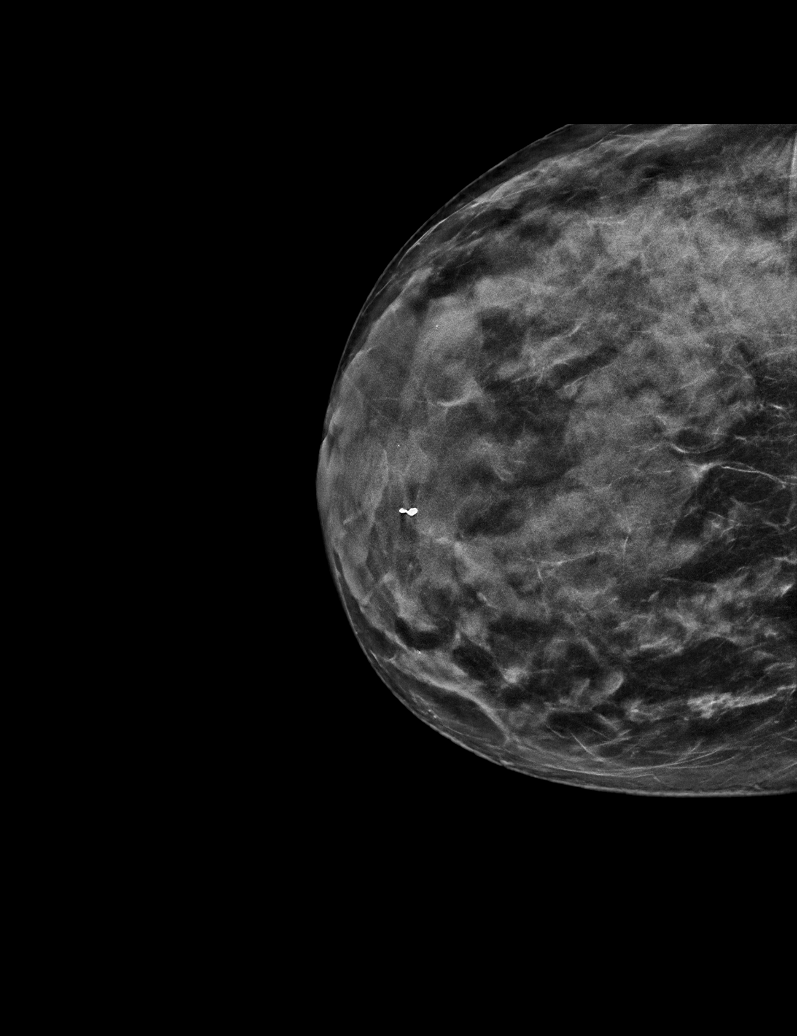

[R MLO synth-2D]
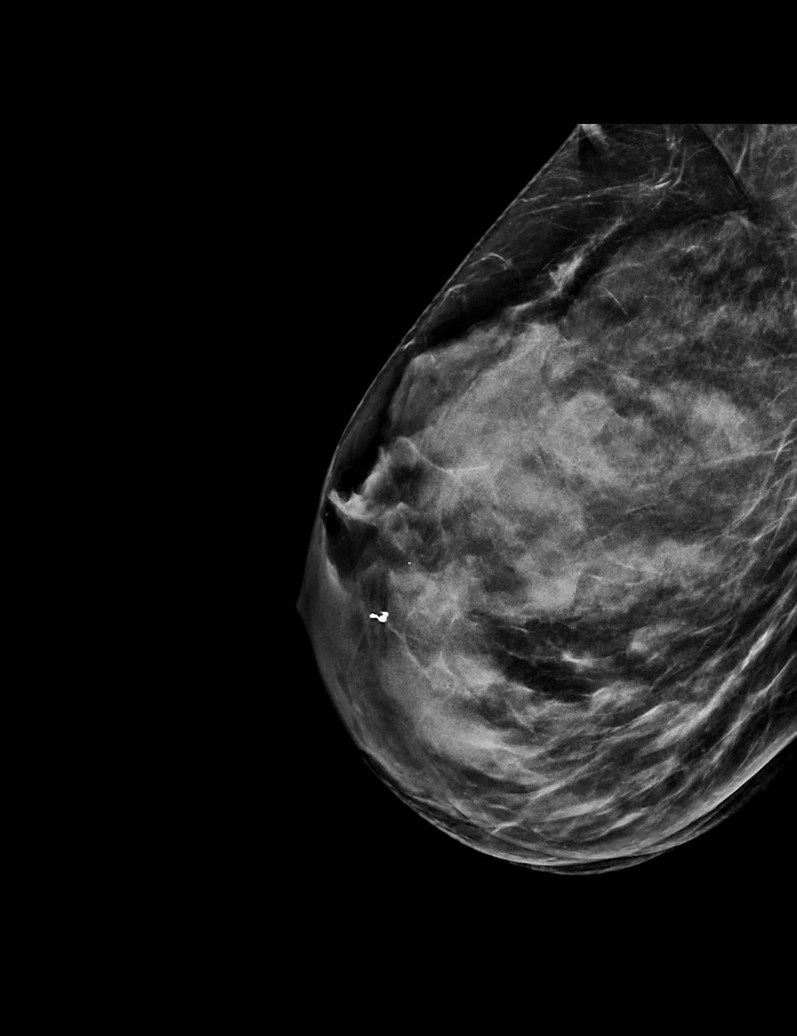

[R CC tomo · tomo slice 31/60.0]
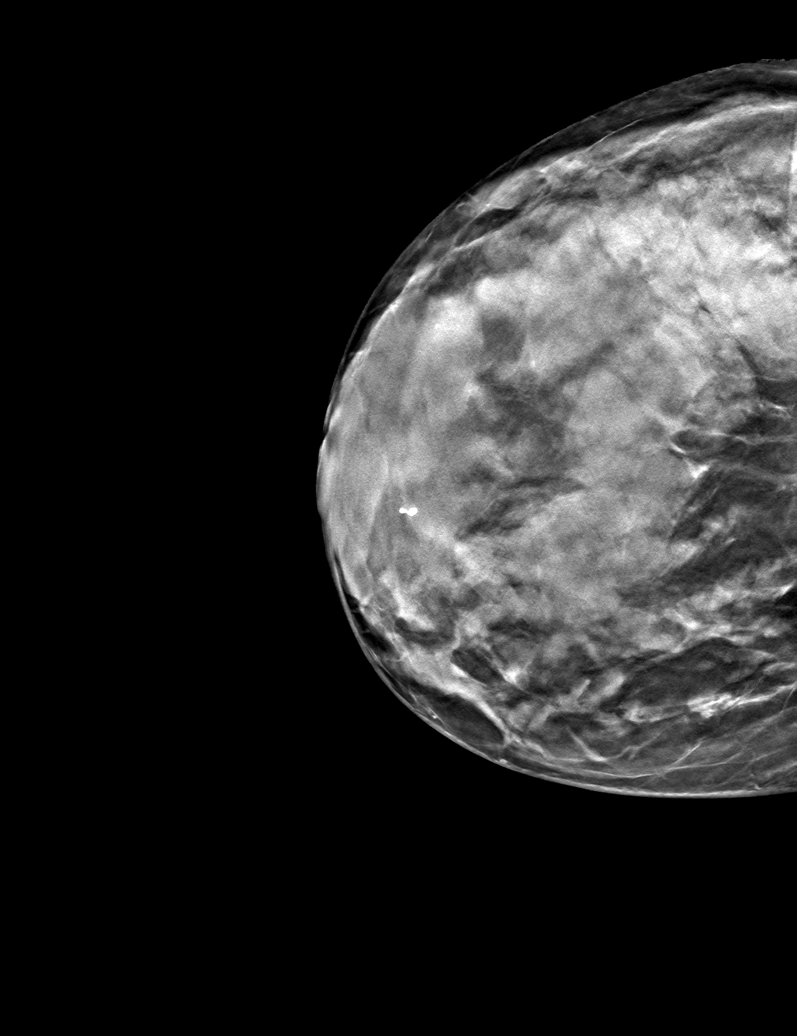

[6 of 30 positions shown; findings below may reference images not displayed]

ACR Breast Density Category d: The breast tissue is extremely dense,
which lowers the sensitivity of mammography
FINDINGS: There are no findings suspicious for malignancy. The images were
evaluated with computer-aided detection.
IMPRESSION: No mammographic evidence of malignancy. A result letter of this
screening mammogram will be mailed directly to the patient.

RECOMMENDATION:
Screening mammogram in one year. (Code:95-0-E9V)

BI-RADS CATEGORY  1: Negative.

## 2022-02-18 MED ORDER — SODIUM CHLORIDE 0.9% FLUSH
10.0000 mL | Freq: Once | INTRAVENOUS | Status: AC
  Administered 2022-02-18: 10 mL

## 2022-02-18 MED ORDER — HEPARIN SOD (PORK) LOCK FLUSH 100 UNIT/ML IV SOLN
500.0000 [IU] | Freq: Once | INTRAVENOUS | Status: AC | PRN
  Administered 2022-02-18: 500 [IU]

## 2022-02-18 MED ORDER — ACETAMINOPHEN 325 MG PO TABS
650.0000 mg | ORAL_TABLET | Freq: Once | ORAL | Status: AC
  Administered 2022-02-18: 650 mg via ORAL
  Filled 2022-02-18: qty 2

## 2022-02-18 MED ORDER — LORATADINE 10 MG PO TABS
10.0000 mg | ORAL_TABLET | Freq: Once | ORAL | Status: AC
  Administered 2022-02-18: 10 mg via ORAL
  Filled 2022-02-18: qty 1

## 2022-02-18 MED ORDER — SODIUM CHLORIDE 0.9 % IV SOLN: Freq: Once | INTRAVENOUS | Status: AC

## 2022-02-18 MED ORDER — TRASTUZUMAB-DKST CHEMO 150 MG IV SOLR
6.0000 mg/kg | Freq: Once | INTRAVENOUS | Status: AC
  Administered 2022-02-18: 315 mg via INTRAVENOUS
  Filled 2022-02-18: qty 15

## 2022-02-18 MED ORDER — SODIUM CHLORIDE 0.9% FLUSH
10.0000 mL | INTRAVENOUS | Status: DC | PRN
  Administered 2022-02-18: 10 mL

## 2022-02-18 NOTE — Assessment & Plan Note (Addendum)
This is a very pleasant 76 year old postmenopausal female patient with newly diagnosed left breast upper inner quadrant invasive ductal carcinoma, grade 2, ER +90% strong staining, PR 0% negative, HER2 positive by IHC 3+, Ki-67 of 30% referred to breast [facility] for additional recommendations.   She is tolerating adjuvant chemotherapy very well except for mildly worsening neuropathy. We try dose reduction of Taxol to 60 mg/m given worsening neuropathy.  She continues to have bothersome neuropathy, she is not able to open pill bottles and she is very worried about her quality of life.  She also has underlying Raynaud's phenomenon.  Given no change in neuropathy, I believe it is reasonable to omit the last 2 planned dose of Taxol and continue Herceptin alone.  Husband had several good questions about the percentage of benefit reduction with this measure.  We have discussed that she has received about 10 cycles of Taxol and she will continue to have worsening neuropathy for the proceed with the next 2 planned doses.  I have also clearly discussed that if this breast cancer were to recur it is most likely not because she is skipped the last 2 doses of Taxol.   Echocardiogram appears satisfactory, okay to continue Herceptin.  I changed it to every 3 weeks dosing.  Persistent left lower extremity swelling from DVT, she will continue anticoagulation for total of 6 months.  \ I have sent an in basket message to Dr. Lisbeth Renshaw with you about radiation.  She will return to clinic in 3 weeks or sooner as needed Thank you for consulting [place] in the care of this patient.  Please do not hesitate to contact us with any additional questions or concerns.

## 2022-02-18 NOTE — Patient Instructions (Signed)
[facility] MEDICAL ONCOLOGY  Discharge Instructions: Thank you for choosing [facility] to provide your oncology and hematology care.   If you have a lab appointment with the Cancer Center, please go directly to the Cancer Center and check in at the registration area.   Wear comfortable clothing and clothing appropriate for easy access to any Portacath or PICC line.   We strive to give you quality time with your provider. You may need to reschedule your appointment if you arrive late (15 or more minutes).  Arriving late affects you and other patients whose appointments are after yours.  Also, if you miss three or more appointments without notifying the office, you may be dismissed from the clinic at the provider's discretion.      For prescription refill requests, have your pharmacy contact our office and allow 72 hours for refills to be completed.    Today you received the following chemotherapy and/or immunotherapy agents: trastuzumab      To help prevent nausea and vomiting after your treatment, we encourage you to take your nausea medication as directed.  BELOW ARE SYMPTOMS THAT SHOULD BE REPORTED IMMEDIATELY: *FEVER GREATER THAN 100.4 F (38 C) OR HIGHER *CHILLS OR SWEATING *NAUSEA AND VOMITING THAT IS NOT CONTROLLED WITH YOUR NAUSEA MEDICATION *UNUSUAL SHORTNESS OF BREATH *UNUSUAL BRUISING OR BLEEDING *URINARY PROBLEMS (pain or burning when urinating, or frequent urination) *BOWEL PROBLEMS (unusual diarrhea, constipation, pain near the anus) TENDERNESS IN MOUTH AND THROAT WITH OR WITHOUT PRESENCE OF ULCERS (sore throat, sores in mouth, or a toothache) UNUSUAL RASH, SWELLING OR PAIN  UNUSUAL VAGINAL DISCHARGE OR ITCHING   Items with * indicate a potential emergency and should be followed up as soon as possible or go to the Emergency Department if any problems should occur.  Please show the CHEMOTHERAPY ALERT CARD or IMMUNOTHERAPY ALERT CARD at check-in  to the Emergency Department and triage nurse.  Should you have questions after your visit or need to cancel or reschedule your appointment, please contact [facility] MEDICAL ONCOLOGY  Dept: [phone]  and follow the prompts.  Office hours are 8:00 a.m. to 4:30 p.m. Monday - Friday. Please note that voicemails left after 4:00 p.m. may not be returned until the following business day.  We are closed weekends and major holidays. You have access to a nurse at all times for urgent questions. Please call the main number to the clinic Dept: [phone] and follow the prompts.   For any non-urgent questions, you may also contact your provider using MyChart. We now offer e-Visits for anyone 18 and older to request care online for non-urgent symptoms. For details visit [email or web address].   Also download the MyChart app! Go to the app store, search "MyChart", open the app, select [facility], and log in with your MyChart username and password.  Masks are optional in the cancer centers. If you would like for your care team to wear a mask while they are taking care of you, please let them know. You may have one support person who is at least 75 years old accompany you for your appointments. 

## 2022-02-18 NOTE — Progress Notes (Signed)
[facility] NOTE  Patient Care Team: Dorothyann Peng, NP as PCP - General (Family Medicine) Rolm Bookbinder, MD as Consulting Physician (General Surgery) Benay Pike, MD as Consulting Physician (Hematology and Oncology) Kyung Rudd, MD as Consulting Physician (Radiation Oncology) Rockwell Germany, RN as Oncology Nurse Navigator Mauro Kaufmann, RN as Oncology Nurse Navigator  CHIEF COMPLAINTS/PURPOSE OF CONSULTATION:    HISTORY OF PRESENTING ILLNESS:   Patricia Marshall 76 y.o. female is here because of recent diagnosis of left breast cancer.  I reviewed her records extensively and collaborated the history with the patient.  SUMMARY OF ONCOLOGIC HISTORY: Oncology History  Malignant neoplasm of upper-inner quadrant of left breast in female, estrogen receptor positive ([place])  09/20/2021 Mammogram   Screening mammogram showed extremely dense breasts and a possible mass in the left breast warranting further evaluation.  Breast density category is D Diagnostic mammogram of the breast showed indeterminate mass in the 11:00 location of the left breast.  Indeterminate mass in the lower anterior left axilla.   10/04/2021 Pathology Results   Pathology showed grade 2 invasive ductal carcinoma, left axillary soft tissue needle core biopsy showed benign breast tissue with patchy changes prognostics from the breast tumor showed ER 90% positive strong staining PR 0%, negative, Ki-67 of 30% and HER2 positive for IHC 3+   10/19/2021 Initial Diagnosis   Malignant neoplasm of upper-inner quadrant of left breast in female, estrogen receptor positive ([place])   10/23/2021 Cancer Staging   Staging form: Breast, AJCC 8th Edition - Pathologic: Stage IIA (pT2, pN0, cM0, G3, ER+, PR-, HER2+) - Signed by Benay Pike, MD on 11/26/2021 Histologic grading system: 3 grade system   11/10/2021 Genetic Testing   Negative hereditary cancer genetic testing: no pathogenic variants detected in  [facility] CancerNext-Expanded +RNA Panel.  Report date is November 10, 2021.   The CancerNext-Expanded gene panel offered by [facility] and includes sequencing, rearrangement, and RNA analysis for the following 77 genes: AIP, ALK, APC, ATM, AXIN2, BAP1, BARD1, BLM, BMPR1A, BRCA1, BRCA2, BRIP1, CDC73, CDH1, CDK4, CDKN1B, CDKN2A, CHEK2, CTNNA1, DICER1, FANCC, FH, FLCN, GALNT12, KIF1B, LZTR1, MAX, MEN1, MET, MLH1, MSH2, MSH3, MSH6, MUTYH, NBN, NF1, NF2, NTHL1, PALB2, PHOX2B, PMS2, POT1, PRKAR1A, PTCH1, PTEN, RAD51C, RAD51D, RB1, RECQL, RET, SDHA, SDHAF2, SDHB, SDHC, SDHD, SMAD4, SMARCA4, SMARCB1, SMARCE1, STK11, SUFU, TMEM127, TP53, TSC1, TSC2, VHL and XRCC2 (sequencing and deletion/duplication); EGFR, EGLN1, HOXB13, KIT, MITF, PDGFRA, POLD1, and POLE (sequencing only); EPCAM and GREM1 (deletion/duplication only).    11/15/2021 Surgery   Left breast lumpectomy: 2.4 cm grade 3 invasive ductal carcinoma, margins -2 sentinel lymph nodes negative.   12/10/2021 -  Chemotherapy   Patient is on Treatment Plan : BREAST Paclitaxel + Trastuzumab q7d / Trastuzumab q21d      Interval history  Ms. Vidrine is here for follow-up. She said overall this past week was very tough. Last week, she has felt some myalgias which lasted for 3 days. She has noticed itchy skin as well after last visit. Fatigue was bothersome. Neuropathy is still noticeable at the tips of the fingers. She had difficulty opening pill bottles.  She is very worried if this will be permanent and if she will not have a good quality of life down the lane. She has noted some dizziness when she stands up. The one most important thing she has noticed was ocular migraines in the left eye, she used to have once a yr , in the past week, she has noticed some attacks  which lasted for almost 25 minutes and happened 5 times in the past week.  She has never seen an ophthalmologist for this.  Rest of the pertinent 10 point ROS reviewed and negative  MEDICAL  HISTORY:  Past Medical History:  Diagnosis Date   Arthritis    Breast cancer ([place])    Chicken pox    High cholesterol    PONV (postoperative nausea and vomiting)    Vitamin D deficiency     SURGICAL HISTORY: Past Surgical History:  Procedure Laterality Date   [place]   BREAST LUMPECTOMY WITH AXILLARY LYMPH NODE BIOPSY Left 11/15/2021   Procedure: LEFT BREAST LUMPECTOMY WITH AXILLARY SENTINEL LYMPH NODE BIOPSY;  Surgeon: Rolm Bookbinder, MD;  Location: [facility];  Service: General;  Laterality: Left;   PORTACATH PLACEMENT Right 11/15/2021   Procedure: INSERTION PORT-A-CATH;  Surgeon: Rolm Bookbinder, MD;  Location: [place];  Service: General;  Laterality: Right;   TONSILLECTOMY AND ADENOIDECTOMY  1954    SOCIAL HISTORY: Social History   Socioeconomic History   Marital status: Married    Spouse name: Not on file   Number of children: Not on file   Years of education: Not on file   Highest education level: Not on file  Occupational History   Not on file  Tobacco Use   Smoking status: Never   Smokeless tobacco: Never  Vaping Use   Vaping Use: Never used  Substance and Sexual Activity   Alcohol use: Yes    Alcohol/week: 1.0 standard drink of alcohol    Types: 1 Glasses of wine per week   Drug use: Never   Sexual activity: Not on file  Other Topics Concern   Not on file  Social History Narrative   Married    Retired    Investment banker, operational of Radio broadcast assistant Strain: [place]  (09/03/2021)   Overall Financial Resource Strain (CARDIA)    Difficulty of Paying Living Expenses: Not hard at all  Food Insecurity: No Food Insecurity (09/03/2021)   Hunger Vital Sign    Worried About Running Out of Food in the Last Year: Never true    [place] in the Last Year: Never true  Transportation Needs: No Transportation Needs (09/03/2021)   PRAPARE - Hydrologist  (Medical): No    Lack of Transportation (Non-Medical): No  Physical Activity: Sufficiently Active (09/03/2021)   Exercise Vital Sign    Days of Exercise per Week: 4 days    Minutes of Exercise per Session: 120 min  Stress: No Stress Concern Present (09/03/2021)   [place]    Feeling of Stress : Not at all  Social Connections: [place] (09/03/2021)   Social Connection and Isolation Panel [NHANES]    Frequency of Communication with Friends and Family: More than three times a week    Frequency of Social Gatherings with Friends and Family: More than three times a week    Attends Religious Services: More than 4 times per year    Active Member of [employer] or Organizations: Yes    Attends Archivist Meetings: More than 4 times per year    Marital Status: Married  Human resources officer Violence: Not At Risk (09/03/2021)   Humiliation, Afraid, Rape, and Kick questionnaire    Fear of Current or Ex-Partner: No    Emotionally  Abused: No    Physically Abused: No    Sexually Abused: No    FAMILY HISTORY: Family History  Problem Relation Age of Onset   Stomach cancer Mother 77   Heart attack Father 36   Cancer Sister        dx mid 43s; unknown primary w/ mets; treated like ovarian cancer   Heart attack Brother 51   Heart attack Brother 11    ALLERGIES:  is allergic to other, ampicillin, and anesthesia s-i-40 [propofol].  MEDICATIONS:  Current Outpatient Medications  Medication Sig Dispense Refill   apixaban (ELIQUIS) 5 MG TABS tablet Take 1 tablet (5 mg total) by mouth 2 (two) times daily. 60 tablet 3   Cholecalciferol (VITAMIN D) 50 MCG (2000 UT) tablet Take 2,000 Units by mouth daily.     lidocaine-prilocaine (EMLA) cream Apply to affected area once 30 g 3   ondansetron (ZOFRAN) 4 MG tablet Take 1 tablet (4 mg total) by mouth daily as needed for nausea or vomiting. 10 tablet 0   ondansetron (ZOFRAN) 8 MG  tablet Take 1 tablet (8 mg total) by mouth every 8 (eight) hours as needed for nausea or vomiting. 30 tablet 1   prochlorperazine (COMPAZINE) 10 MG tablet Take 1 tablet (10 mg total) by mouth every 6 (six) hours as needed for nausea or vomiting. 30 tablet 1   traMADol (ULTRAM) 50 MG tablet Take 1 tablet (50 mg total) by mouth every 6 (six) hours as needed. 10 tablet 0   No current facility-administered medications for this visit.   Facility-Administered Medications Ordered in Other Visits  Medication Dose Route Frequency Provider Last Rate Last Admin   sodium chloride flush (NS) 0.9 % injection 10 mL  10 mL Intracatheter PRN Benay Pike, MD   10 mL at 02/18/22 1444    REVIEW OF SYSTEMS:   Constitutional: Denies fevers, chills or abnormal night sweats Eyes: Denies blurriness of vision, double vision or watery eyes Ears, nose, mouth, throat, and face: Denies mucositis or sore throat Respiratory: Denies cough, dyspnea or wheezes Cardiovascular: Denies palpitation, chest discomfort or lower extremity swelling Gastrointestinal:  Denies nausea, heartburn or change in bowel habits Skin: Denies abnormal skin rashes Lymphatics: Denies new lymphadenopathy or easy bruising Neurological:Denies numbness, tingling or new weaknesses Behavioral/Psych: Mood is stable, no new changes  Breast: Denies any palpable lumps or discharge All other systems were reviewed with the patient and are negative.  PHYSICAL EXAMINATION: ECOG PERFORMANCE STATUS: 0 - Asymptomatic  Vitals:   02/18/22 1204  BP: (!) 154/75  Pulse: 71  Resp: 14  Temp: 97.9 F (36.6 C)  SpO2: 100%    Filed Weights   02/18/22 1204  Weight: 122 lb 11.2 oz (55.7 kg)    Physical Exam Constitutional:      Appearance: Normal appearance. She is normal weight.  Cardiovascular:     Rate and Rhythm: Normal rate and regular rhythm.     Pulses: Normal pulses.     Heart sounds: Normal heart sounds.  Pulmonary:     Effort: Pulmonary  effort is normal.     Breath sounds: Normal breath sounds.  Chest:     Comments: Port site appears well  Musculoskeletal:        General: No swelling or tenderness.     Cervical back: Normal range of motion and neck supple. No rigidity.  Lymphadenopathy:     Cervical: No cervical adenopathy.  Skin:    General: Skin is warm and dry.  Neurological:  General: No focal deficit present.     Mental Status: She is alert.  Psychiatric:        Mood and Affect: Mood normal.    LABORATORY DATA:  I have reviewed the data as listed Lab Results  Component Value Date   WBC 4.9 02/18/2022   HGB 10.8 (L) 02/18/2022   HCT 31.4 (L) 02/18/2022   MCV 91.5 02/18/2022   PLT 202 02/18/2022   Lab Results  Component Value Date   NA 137 02/18/2022   K 4.1 02/18/2022   CL 104 02/18/2022   CO2 28 02/18/2022    RADIOGRAPHIC STUDIES: I have personally reviewed the radiological reports and agreed with the findings in the report.  ASSESSMENT AND PLAN:  Malignant neoplasm of upper-inner quadrant of left breast in female, estrogen receptor positive ([place]) This is a very pleasant 76 year old postmenopausal female patient with newly diagnosed left breast upper inner quadrant invasive ductal carcinoma, grade 2, ER +90% strong staining, PR 0% negative, HER2 positive by IHC 3+, Ki-67 of 30% referred to breast [place] for additional recommendations.   She is tolerating adjuvant chemotherapy very well except for mildly worsening neuropathy. We try dose reduction of Taxol to 60 mg/m given worsening neuropathy.  She continues to have bothersome neuropathy, she is not able to open pill bottles and she is very worried about her quality of life.  She also has underlying Raynaud's phenomenon.  Given no change in neuropathy, I believe it is reasonable to omit the last 2 planned dose of Taxol and continue Herceptin alone.  Husband had several good questions about the percentage of benefit reduction with this measure.  We  have discussed that she has received about 10 cycles of Taxol and she will continue to have worsening neuropathy for the proceed with the next 2 planned doses.  I have also clearly discussed that if this breast cancer were to recur it is most likely not because she is skipped the last 2 doses of Taxol.   Echocardiogram appears satisfactory, okay to continue Herceptin.  I changed it to every 3 weeks dosing.  Persistent left lower extremity swelling from DVT, she will continue anticoagulation for total of 6 months.  \ I have sent an in basket message to Dr. Lisbeth Renshaw with you about radiation.  She will return to clinic in 3 weeks or sooner as needed Thank you for consulting [place] in the care of this patient.  Please do not hesitate to contact us with any additional questions or concerns.    Total time spent: 30 minutes including history, physical exam, review of records, counseling and coordination of care All questions were answered. The patient knows to call the clinic with any problems, questions or concerns.    Benay Pike, MD 02/18/22

## 2022-02-25 ENCOUNTER — Ambulatory Visit: Payer: Medicare HMO | Attending: General Surgery

## 2022-02-25 DIAGNOSIS — Z483 Aftercare following surgery for neoplasm: Secondary | ICD-10-CM | POA: Insufficient documentation

## 2022-02-25 NOTE — Therapy (Addendum)
  OUTPATIENT PHYSICAL THERAPY SOZO SCREENING NOTE   Patient Name: Patricia Marshall MRN: [id number] DOB:Aug 10, 1945, 76 y.o., female Today's Date: 02/25/2022  PCP: Dorothyann Peng, NP REFERRING PROVIDER: Rolm Bookbinder, MD   PT End of Session - 02/25/22 1512     Visit Number 3   # unchanged due to screen only   PT Start Time 0223    PT Stop Time 1521    PT Time Calculation (min) 10 min    Activity Tolerance Patient tolerated treatment well    Behavior During Therapy WFL for tasks assessed/performed             Past Medical History:  Diagnosis Date   Arthritis    Breast cancer ([place])    Chicken pox    High cholesterol    PONV (postoperative nausea and vomiting)    Vitamin D deficiency    Past Surgical History:  Procedure Laterality Date   [place] LUMPECTOMY WITH AXILLARY LYMPH NODE BIOPSY Left 11/15/2021   Procedure: LEFT BREAST LUMPECTOMY WITH AXILLARY SENTINEL LYMPH NODE BIOPSY;  Surgeon: Rolm Bookbinder, MD;  Location: [facility];  Service: General;  Laterality: Left;   PORTACATH PLACEMENT Right 11/15/2021   Procedure: INSERTION PORT-A-CATH;  Surgeon: Rolm Bookbinder, MD;  Location: [facility];  Service: General;  Laterality: Right;   [place]   Patient Active Problem List   Diagnosis Date Noted   Port-A-Cath in place 02/04/2022   Genetic testing 12/07/2021   Breast cancer ([place]) 11/15/2021   Malignant neoplasm of upper-inner quadrant of left breast in female, estrogen receptor positive ([place]) 10/19/2021   Piriformis syndrome of left side 07/12/2015   Degenerative arthritis of left knee 07/12/2015   BEE STING REACTION, LOCAL 10/24/2006    REFERRING DIAG: left breast cancer at risk for lymphedema  THERAPY DIAG: Aftercare following surgery for neoplasm  PERTINENT HISTORY: Patient was diagnosed on 09/20/2021 with left grade 2 invasive ductal carcinoma  breast cancer. She underwent a left lumpectomy and sentinel node biopsy (2 negative nodes) on 11/15/2021. It is ER positive, PR negative, and HER2 positive with a Ki67 of 30%   PRECAUTIONS: left UE Lymphedema risk, None  SUBJECTIVE: Pt returns for her first 3 month L-Dex screen.   PAIN:  Are you having pain? No  SOZO SCREENING: Patient was assessed today using the SOZO machine to determine the lymphedema index score. This was compared to her baseline score. It was determined that she is within the recommended range when compared to her baseline and no further action is needed at this time. She will continue SOZO screenings. These are done every 3 months for 2 years post operatively followed by every 6 months for 2 years, and then annually.   L-DEX FLOWSHEETS - 02/25/22 1500       L-DEX LYMPHEDEMA SCREENING   Measurement Type Unilateral    L-DEX MEASUREMENT EXTREMITY Upper Extremity    POSITION  Standing    DOMINANT SIDE Right    At Risk Side Left    BASELINE SCORE (UNILATERAL) -3    L-DEX SCORE (UNILATERAL) -1.4    VALUE CHANGE (UNILAT) 1.6              Otelia Limes, PTA 02/25/2022, 3:22 PM

## 2022-02-26 ENCOUNTER — Ambulatory Visit: Payer: Medicare HMO | Admitting: Adult Health

## 2022-02-26 ENCOUNTER — Encounter: Payer: Self-pay | Admitting: *Deleted

## 2022-02-26 ENCOUNTER — Other Ambulatory Visit: Payer: Medicare HMO

## 2022-02-26 ENCOUNTER — Ambulatory Visit: Payer: Medicare HMO

## 2022-02-27 DIAGNOSIS — G43909 Migraine, unspecified, not intractable, without status migrainosus: Secondary | ICD-10-CM | POA: Diagnosis not present

## 2022-02-27 DIAGNOSIS — H40053 Ocular hypertension, bilateral: Secondary | ICD-10-CM | POA: Diagnosis not present

## 2022-02-27 DIAGNOSIS — H2513 Age-related nuclear cataract, bilateral: Secondary | ICD-10-CM | POA: Diagnosis not present

## 2022-03-04 ENCOUNTER — Other Ambulatory Visit: Payer: Medicare HMO

## 2022-03-04 ENCOUNTER — Ambulatory Visit: Payer: Medicare HMO

## 2022-03-04 ENCOUNTER — Ambulatory Visit: Payer: Medicare HMO | Admitting: Hematology and Oncology

## 2022-03-04 NOTE — Progress Notes (Incomplete)
Radiation Oncology         [phone]) [phone] ________________________________  Name: Patricia Marshall        MRN: [id number]  Date of Service: 03/07/2022 DOB: 1946-03-01  WJ:[id number], Tommi Rumps, NP  Benay Pike, MD     REFERRING PHYSICIAN: Benay Pike, MD   DIAGNOSIS: The encounter diagnosis was Malignant neoplasm of upper-inner quadrant of left breast in female, estrogen receptor positive ([place]).   HISTORY OF PRESENT ILLNESS: Patricia Marshall is a 76 y.o. female originally seen in the multidisciplinary breast clinic for a new diagnosis of left breast cancer. The patient had a screening mammogram in July 2023 that showed a possible mass in the left breast.  No abnormalities were seen in the right.  She underwent diagnostic mammography and ultrasound on 10/01/2021.  By ultrasound this was seen in the 11 o'clock position measuring up to 2 cm in greatest dimension.  She had an oval mass with angular margins in the left axilla measuring 1.2 cm.  She underwent biopsies of these sites on 10/04/2021.  Final pathology showed grade 2 invasive ductal carcinoma in the left breast specimen.  Her cancer was ER positive PR negative HER2 amplified, her left axillary biopsy showed pseudoangiomatous stromal hyperplasia without evidence of malignancy.    Since her last visit, the patient under went a left lumpectomy with sentinel node biopsy on 11/15/2021 with Dr. Donne Hazel.  Final pathology showed a grade 3 invasive ductal carcinoma measuring up to 2.4 cm.  Her margins were clear but her closest was 5 mm from the posterior margin.  2 sentinel lymph nodes were benign.  She did not have any genetic mutations, and due to her HER2 amplification she began systemic chemotherapy with targeted anti-HER2 therapy on 12/10/2021.  She completed chemotherapy on 02/11/2022 and continues with single agent [place].  She is seen today to discuss adjuvant radiotherapy treatment.   PREVIOUS RADIATION THERAPY: No   PAST MEDICAL  HISTORY:  Past Medical History:  Diagnosis Date   Arthritis    Breast cancer ([place])    Chicken pox    High cholesterol    PONV (postoperative nausea and vomiting)    Vitamin D deficiency        PAST SURGICAL HISTORY: Past Surgical History:  Procedure Laterality Date   [place]   BREAST LUMPECTOMY WITH AXILLARY LYMPH NODE BIOPSY Left 11/15/2021   Procedure: LEFT BREAST LUMPECTOMY WITH AXILLARY SENTINEL LYMPH NODE BIOPSY;  Surgeon: Rolm Bookbinder, MD;  Location: [facility];  Service: General;  Laterality: Left;   PORTACATH PLACEMENT Right 11/15/2021   Procedure: INSERTION PORT-A-CATH;  Surgeon: Rolm Bookbinder, MD;  Location: [facility];  Service: General;  Laterality: Right;   TONSILLECTOMY AND ADENOIDECTOMY  1954     FAMILY HISTORY:  Family History  Problem Relation Age of Onset   Stomach cancer Mother 4   Heart attack Father 27   Cancer Sister        dx mid 61s; unknown primary w/ mets; treated like ovarian cancer   Heart attack Brother 30   Heart attack Brother 69     SOCIAL HISTORY:  reports that she has never smoked. She has never used smokeless tobacco. She reports current alcohol use of about 1.0 standard drink of alcohol per week. She reports that she does not use drugs. The patient is married and lives in [place]. She is accompanied by her husband. She is very active.  ALLERGIES: Other, Ampicillin, and Anesthesia s-i-40 [propofol]   MEDICATIONS:  Current Outpatient Medications  Medication Sig Dispense Refill   apixaban (ELIQUIS) 5 MG TABS tablet Take 1 tablet (5 mg total) by mouth 2 (two) times daily. 60 tablet 3   Cholecalciferol (VITAMIN D) 50 MCG (2000 UT) tablet Take 2,000 Units by mouth daily.     lidocaine-prilocaine (EMLA) cream Apply to affected area once 30 g 3   ondansetron (ZOFRAN) 4 MG tablet Take 1 tablet (4 mg total) by mouth daily as needed for nausea or vomiting. 10  tablet 0   ondansetron (ZOFRAN) 8 MG tablet Take 1 tablet (8 mg total) by mouth every 8 (eight) hours as needed for nausea or vomiting. 30 tablet 1   prochlorperazine (COMPAZINE) 10 MG tablet Take 1 tablet (10 mg total) by mouth every 6 (six) hours as needed for nausea or vomiting. 30 tablet 1   traMADol (ULTRAM) 50 MG tablet Take 1 tablet (50 mg total) by mouth every 6 (six) hours as needed. 10 tablet 0   No current facility-administered medications for this visit.     REVIEW OF SYSTEMS: On review of systems, the patient reports that she is ***     PHYSICAL EXAM:  Wt Readings from Last 3 Encounters:  02/18/22 122 lb 11.2 oz (55.7 kg)  02/11/22 121 lb 8 oz (55.1 kg)  02/04/22 121 lb 4 oz (55 kg)   Temp Readings from Last 3 Encounters:  02/18/22 97.9 F (36.6 C) (Temporal)  02/11/22 97.6 F (36.4 C) (Temporal)  02/04/22 98 F (36.7 C) (Oral)   BP Readings from Last 3 Encounters:  02/18/22 (!) 154/75  02/11/22 133/76  02/11/22 (!) 167/68   Pulse Readings from Last 3 Encounters:  02/18/22 71  02/11/22 75  02/11/22 74    In general this is a well appearing caucasian female in no acute distress. She's alert and oriented x4 and appropriate throughout the examination. Cardiopulmonary assessment is negative for acute distress and she exhibits normal effort.  Her left breast incision and axillary incision site are both intact without erythema, separation, or drainage.    ECOG = 0  0 - Asymptomatic (Fully active, able to carry on all predisease activities without restriction)  1 - Symptomatic but completely ambulatory (Restricted in physically strenuous activity but ambulatory and able to carry out work of a light or sedentary nature. For example, light housework, office work)  2 - Symptomatic, <50% in bed during the day (Ambulatory and capable of all self care but unable to carry out any work activities. Up and about more than 50% of waking hours)  3 - Symptomatic, >50% in  bed, but not bedbound (Capable of only limited self-care, confined to bed or chair 50% or more of waking hours)  4 - Bedbound (Completely disabled. Cannot carry on any self-care. Totally confined to bed or chair)  5 - Death   Eustace Pen MM, Creech RH, Tormey DC, et al. [phone]). "Toxicity and response criteria of the [facility]". [place] Oncol. 5 (6): 649-55    LABORATORY DATA:  Lab Results  Component Value Date   WBC 4.9 02/18/2022   HGB 10.8 (L) 02/18/2022   HCT 31.4 (L) 02/18/2022   MCV 91.5 02/18/2022   PLT 202 02/18/2022   Lab Results  Component Value Date   NA 137 02/18/2022   K 4.1 02/18/2022   CL 104 02/18/2022   CO2 28 02/18/2022   Lab Results  Component Value  Date   ALT 13 02/18/2022   AST 15 02/18/2022   ALKPHOS 55 02/18/2022   BILITOT 0.4 02/18/2022      RADIOGRAPHY: No results found.     IMPRESSION/PLAN: 1. Stage IIA, pT2N0M0, grade 2 ER positive, HER2 amplified invasive ductal carcinoma of the left breast. Dr. Lisbeth Renshaw has reviewed her final pathology and she and I have also reviewed her course to date.  She has done well since surgery and is now complete with the chemotherapy portion of her systemic treatment.  She will continue HER2 therapy until the fall 2024.  We discussed the rationale for external radiotherapy to the breast  to reduce risks of local recurrence followed by antiestrogen therapy. We discussed the risks, benefits, short, and long term effects of radiotherapy, as well as the curative intent, and the patient is interested in proceeding.  I reviewed the delivery and logistics of radiotherapy and that Dr. Lisbeth Renshaw recommends 4 weeks of radiotherapy to the left breast with deep inspiration breath hold technique. Written consent is obtained and placed in the chart, a copy was provided to the patient.  She will simulate today.  In a visit lasting 60 minutes, greater than 50% of the time was spent face to face reviewing her case, as  well as in preparation of, discussing, and coordinating the patient's care.  The above documentation reflects my direct findings during this shared patient visit. Please see the separate note by Dr. Lisbeth Renshaw on this date for the remainder of the patient's plan of care.    Carola Rhine, [facility]    **Disclaimer: This note was dictated with voice recognition software. Similar sounding words can inadvertently be transcribed and this note may contain transcription errors which may not have been corrected upon publication of note.**

## 2022-03-06 ENCOUNTER — Telehealth: Payer: Self-pay

## 2022-03-06 NOTE — Telephone Encounter (Signed)
Patient called to report testing positive for Covid on 03/04/22. Patient in touch with PCP. Coughing and throat discomfort. Patient understands to seek medical treatment for acute symptoms. Dr. Chryl Heck aware.

## 2022-03-07 ENCOUNTER — Ambulatory Visit: Payer: Medicare HMO | Admitting: Radiation Oncology

## 2022-03-07 ENCOUNTER — Ambulatory Visit: Payer: Medicare HMO

## 2022-03-07 ENCOUNTER — Telehealth: Payer: Self-pay | Admitting: *Deleted

## 2022-03-07 DIAGNOSIS — C50212 Malignant neoplasm of upper-inner quadrant of left female breast: Secondary | ICD-10-CM

## 2022-03-07 NOTE — Telephone Encounter (Signed)
Pt called with questions in regards to recent COVID+ status. Pt is due for herceptin on 12/26 and is asking if appt needs to be canceled. Per COVID+ protocol, pt will have infusion in beds. Corresponded with charge nurse changes for pt that day. Advised pt that time of appt will be changed to end of day per protocol. Advised to f/u on MYchart for appt changes. Pt verbalized understanding.

## 2022-03-12 ENCOUNTER — Inpatient Hospital Stay: Payer: Medicare HMO

## 2022-03-12 ENCOUNTER — Inpatient Hospital Stay: Payer: Medicare HMO | Admitting: Physician Assistant

## 2022-03-12 VITALS — BP 135/68 | HR 69 | Temp 98.1°F | Resp 16

## 2022-03-12 DIAGNOSIS — Z170 Estrogen receptor positive status [ER+]: Secondary | ICD-10-CM | POA: Diagnosis not present

## 2022-03-12 DIAGNOSIS — C50212 Malignant neoplasm of upper-inner quadrant of left female breast: Secondary | ICD-10-CM | POA: Diagnosis not present

## 2022-03-12 DIAGNOSIS — G629 Polyneuropathy, unspecified: Secondary | ICD-10-CM | POA: Diagnosis not present

## 2022-03-12 DIAGNOSIS — Z7901 Long term (current) use of anticoagulants: Secondary | ICD-10-CM | POA: Diagnosis not present

## 2022-03-12 DIAGNOSIS — Z5112 Encounter for antineoplastic immunotherapy: Secondary | ICD-10-CM | POA: Diagnosis not present

## 2022-03-12 DIAGNOSIS — Z86718 Personal history of other venous thrombosis and embolism: Secondary | ICD-10-CM | POA: Diagnosis not present

## 2022-03-12 DIAGNOSIS — Z5111 Encounter for antineoplastic chemotherapy: Secondary | ICD-10-CM | POA: Diagnosis not present

## 2022-03-12 DIAGNOSIS — R42 Dizziness and giddiness: Secondary | ICD-10-CM | POA: Diagnosis not present

## 2022-03-12 MED ORDER — ACETAMINOPHEN 325 MG PO TABS
650.0000 mg | ORAL_TABLET | Freq: Once | ORAL | Status: AC
  Administered 2022-03-12: 650 mg via ORAL
  Filled 2022-03-12: qty 2

## 2022-03-12 MED ORDER — LORATADINE 10 MG PO TABS
10.0000 mg | ORAL_TABLET | Freq: Every day | ORAL | Status: DC
  Administered 2022-03-12: 10 mg via ORAL
  Filled 2022-03-12: qty 1

## 2022-03-12 MED ORDER — TRASTUZUMAB-DKST CHEMO 150 MG IV SOLR
6.0000 mg/kg | Freq: Once | INTRAVENOUS | Status: AC
  Administered 2022-03-12: 315 mg via INTRAVENOUS
  Filled 2022-03-12: qty 15

## 2022-03-12 MED ORDER — SODIUM CHLORIDE 0.9 % IV SOLN: Freq: Once | INTRAVENOUS | Status: AC

## 2022-03-12 MED ORDER — APIXABAN 5 MG PO TABS: 5.0000 mg | ORAL_TABLET | Freq: Two times a day (BID) | ORAL | 3 refills | Status: DC

## 2022-03-12 NOTE — Progress Notes (Signed)
Neuropathy in fingertips. Comes and goes.  Improved with d/c paclitaxel

## 2022-03-12 NOTE — Patient Instructions (Signed)
[facility]  Discharge Instructions: Thank you for choosing [facility] to provide your oncology and hematology care.   If you have a lab appointment with the Cancer Center, please go directly to the Cancer Center and check in at the registration area.   Wear comfortable clothing and clothing appropriate for easy access to any Portacath or PICC line.   We strive to give you quality time with your provider. You may need to reschedule your appointment if you arrive late (15 or more minutes).  Arriving late affects you and other patients whose appointments are after yours.  Also, if you miss three or more appointments without notifying the office, you may be dismissed from the clinic at the provider's discretion.      For prescription refill requests, have your pharmacy contact our office and allow 72 hours for refills to be completed.    Today you received the following chemotherapy and/or immunotherapy agents herceptin      To help prevent nausea and vomiting after your treatment, we encourage you to take your nausea medication as directed.  BELOW ARE SYMPTOMS THAT SHOULD BE REPORTED IMMEDIATELY: *FEVER GREATER THAN 100.4 F (38 C) OR HIGHER *CHILLS OR SWEATING *NAUSEA AND VOMITING THAT IS NOT CONTROLLED WITH YOUR NAUSEA MEDICATION *UNUSUAL SHORTNESS OF BREATH *UNUSUAL BRUISING OR BLEEDING *URINARY PROBLEMS (pain or burning when urinating, or frequent urination) *BOWEL PROBLEMS (unusual diarrhea, constipation, pain near the anus) TENDERNESS IN MOUTH AND THROAT WITH OR WITHOUT PRESENCE OF ULCERS (sore throat, sores in mouth, or a toothache) UNUSUAL RASH, SWELLING OR PAIN  UNUSUAL VAGINAL DISCHARGE OR ITCHING   Items with * indicate a potential emergency and should be followed up as soon as possible or go to the Emergency Department if any problems should occur.  Please show the CHEMOTHERAPY ALERT CARD or IMMUNOTHERAPY ALERT CARD at check-in to  the Emergency Department and triage nurse.  Should you have questions after your visit or need to cancel or reschedule your appointment, please contact [facility]  Dept: [phone]  and follow the prompts.  Office hours are 8:00 a.m. to 4:30 p.m. Monday - Friday. Please note that voicemails left after 4:00 p.m. may not be returned until the following business day.  We are closed weekends and major holidays. You have access to a nurse at all times for urgent questions. Please call the main number to the clinic Dept: [phone] and follow the prompts.   For any non-urgent questions, you may also contact your provider using MyChart. We now offer e-Visits for anyone 18 and older to request care online for non-urgent symptoms. For details visit [email or web address].   Also download the MyChart app! Go to the app store, search "MyChart", open the app, select [facility], and log in with your MyChart username and password.  Masks are optional in the cancer centers. If you would like for your care team to wear a mask while they are taking care of you, please let them know. You may have one support person who is at least 75 years old accompany you for your appointments. 

## 2022-03-12 NOTE — Progress Notes (Signed)
[facility] PROGRESS NOTE  Patient Care Team: Dorothyann Peng, NP as PCP - General (Family Medicine) Rolm Bookbinder, MD as Consulting Physician (General Surgery) Benay Pike, MD as Consulting Physician (Hematology and Oncology) Kyung Rudd, MD as Consulting Physician (Radiation Oncology) Rockwell Germany, RN as Oncology Nurse Navigator Mauro Kaufmann, RN as Oncology Nurse Navigator  CHIEF COMPLAINTS/PURPOSE OF CONSULTATION:  Left breast cancer  SUMMARY OF ONCOLOGIC HISTORY: Oncology History  Malignant neoplasm of upper-inner quadrant of left breast in female, estrogen receptor positive ([place])  09/20/2021 Mammogram   Screening mammogram showed extremely dense breasts and a possible mass in the left breast warranting further evaluation.  Breast density category is D Diagnostic mammogram of the breast showed indeterminate mass in the 11:00 location of the left breast.  Indeterminate mass in the lower anterior left axilla.   10/04/2021 Pathology Results   Pathology showed grade 2 invasive ductal carcinoma, left axillary soft tissue needle core biopsy showed benign breast tissue with patchy changes prognostics from the breast tumor showed ER 90% positive strong staining PR 0%, negative, Ki-67 of 30% and HER2 positive for IHC 3+   10/19/2021 Initial Diagnosis   Malignant neoplasm of upper-inner quadrant of left breast in female, estrogen receptor positive ([place])   10/23/2021 Cancer Staging   Staging form: Breast, AJCC 8th Edition - Pathologic: Stage IIA (pT2, pN0, cM0, G3, ER+, PR-, HER2+) - Signed by Benay Pike, MD on 11/26/2021 Histologic grading system: 3 grade system   11/10/2021 Genetic Testing   Negative hereditary cancer genetic testing: no pathogenic variants detected in [facility] CancerNext-Expanded +RNA Panel.  Report date is November 10, 2021.   The CancerNext-Expanded gene panel offered by [facility] and includes sequencing, rearrangement, and RNA analysis for  the following 77 genes: AIP, ALK, APC, ATM, AXIN2, BAP1, BARD1, BLM, BMPR1A, BRCA1, BRCA2, BRIP1, CDC73, CDH1, CDK4, CDKN1B, CDKN2A, CHEK2, CTNNA1, DICER1, FANCC, FH, FLCN, GALNT12, KIF1B, LZTR1, MAX, MEN1, MET, MLH1, MSH2, MSH3, MSH6, MUTYH, NBN, NF1, NF2, NTHL1, PALB2, PHOX2B, PMS2, POT1, PRKAR1A, PTCH1, PTEN, RAD51C, RAD51D, RB1, RECQL, RET, SDHA, SDHAF2, SDHB, SDHC, SDHD, SMAD4, SMARCA4, SMARCB1, SMARCE1, STK11, SUFU, TMEM127, TP53, TSC1, TSC2, VHL and XRCC2 (sequencing and deletion/duplication); EGFR, EGLN1, HOXB13, KIT, MITF, PDGFRA, POLD1, and POLE (sequencing only); EPCAM and GREM1 (deletion/duplication only).    11/15/2021 Surgery   Left breast lumpectomy: 2.4 cm grade 3 invasive ductal carcinoma, margins -2 sentinel lymph nodes negative.   12/10/2021 -  Chemotherapy   Patient is on Treatment Plan : BREAST Paclitaxel + Trastuzumab q7d / Trastuzumab q21d      INTERVAL HISTORY: [name] returns for a follow up before Cycle 4 of trastuzaumab. She was unaccompanied for this visit.   Ms. Gillham reports she is tolerating treatment well and her neuropathy has improved since discontinuing paclitaxel. She continues to have neuropathy in her fingertips which can interfere with her grip/dexterity.   She reports that although she recently tested positive for COVID, she is feeling okay with stable energy levels. She does have some nasal congestion and post nasal drip. Her appetite and weight are unchanged. She denies nausea, vomiting or abdominal pain. Bowel habits are regular without recurrent episodes of diarrhea or constipation. She denies easy bruising or signs of active bleeding. She denies fevers, chill, shortness of breath, chest pain or cough. She has no other complaints.   Rest of the pertinent 10 point ROS reviewed and negative  MEDICAL HISTORY:  Past Medical History:  Diagnosis Date   Arthritis  Breast cancer ([place])    Chicken pox    High cholesterol    PONV (postoperative  nausea and vomiting)    Vitamin D deficiency     SURGICAL HISTORY: Past Surgical History:  Procedure Laterality Date   [place]   BREAST LUMPECTOMY WITH AXILLARY LYMPH NODE BIOPSY Left 11/15/2021   Procedure: LEFT BREAST LUMPECTOMY WITH AXILLARY SENTINEL LYMPH NODE BIOPSY;  Surgeon: Rolm Bookbinder, MD;  Location: [facility];  Service: General;  Laterality: Left;   PORTACATH PLACEMENT Right 11/15/2021   Procedure: INSERTION PORT-A-CATH;  Surgeon: Rolm Bookbinder, MD;  Location: [facility];  Service: General;  Laterality: Right;   TONSILLECTOMY AND ADENOIDECTOMY  1954    SOCIAL HISTORY: Social History   Socioeconomic History   Marital status: Married    Spouse name: Not on file   Number of children: Not on file   Years of education: Not on file   Highest education level: Not on file  Occupational History   Not on file  Tobacco Use   Smoking status: Never   Smokeless tobacco: Never  Vaping Use   Vaping Use: Never used  Substance and Sexual Activity   Alcohol use: Yes    Alcohol/week: 1.0 standard drink of alcohol    Types: 1 Glasses of wine per week   Drug use: Never   Sexual activity: Not on file  Other Topics Concern   Not on file  Social History Narrative   Married    Retired    Investment banker, operational of Radio broadcast assistant Strain: [place]  (09/03/2021)   Overall Financial Resource Strain (CARDIA)    Difficulty of Paying Living Expenses: Not hard at all  Food Insecurity: No Food Insecurity (09/03/2021)   Hunger Vital Sign    Worried About Running Out of Food in the Last Year: Never true    [place] in the Last Year: Never true  Transportation Needs: No Transportation Needs (09/03/2021)   PRAPARE - Hydrologist (Medical): No    Lack of Transportation (Non-Medical): No  Physical Activity: Sufficiently Active (09/03/2021)   Exercise Vital Sign    Days of  Exercise per Week: 4 days    Minutes of Exercise per Session: 120 min  Stress: No Stress Concern Present (09/03/2021)   [place]    Feeling of Stress : Not at all  Social Connections: [place] (09/03/2021)   Social Connection and Isolation Panel [NHANES]    Frequency of Communication with Friends and Family: More than three times a week    Frequency of Social Gatherings with Friends and Family: More than three times a week    Attends Religious Services: More than 4 times per year    Active Member of [employer] or Organizations: Yes    Attends Music therapist: More than 4 times per year    Marital Status: Married  Human resources officer Violence: Not At Risk (09/03/2021)   Humiliation, Afraid, Rape, and Kick questionnaire    Fear of Current or Ex-Partner: No    Emotionally Abused: No    Physically Abused: No    Sexually Abused: No    FAMILY HISTORY: Family History  Problem Relation Age of Onset   Stomach cancer Mother 18   Heart attack Father 62   Cancer Sister  dx mid 43s; unknown primary w/ mets; treated like ovarian cancer   Heart attack Brother 6   Heart attack Brother 69    ALLERGIES:  is allergic to other, ampicillin, and anesthesia s-i-40 [propofol].  MEDICATIONS:  Current Outpatient Medications  Medication Sig Dispense Refill   apixaban (ELIQUIS) 5 MG TABS tablet Take 1 tablet (5 mg total) by mouth 2 (two) times daily. 60 tablet 3   Cholecalciferol (VITAMIN D) 50 MCG (2000 UT) tablet Take 2,000 Units by mouth daily.     lidocaine-prilocaine (EMLA) cream Apply to affected area once 30 g 3   ondansetron (ZOFRAN) 4 MG tablet Take 1 tablet (4 mg total) by mouth daily as needed for nausea or vomiting. 10 tablet 0   ondansetron (ZOFRAN) 8 MG tablet Take 1 tablet (8 mg total) by mouth every 8 (eight) hours as needed for nausea or vomiting. 30 tablet 1   prochlorperazine (COMPAZINE) 10 MG  tablet Take 1 tablet (10 mg total) by mouth every 6 (six) hours as needed for nausea or vomiting. 30 tablet 1   traMADol (ULTRAM) 50 MG tablet Take 1 tablet (50 mg total) by mouth every 6 (six) hours as needed. 10 tablet 0   No current facility-administered medications for this visit.    PHYSICAL EXAMINATION: ECOG PERFORMANCE STATUS: 1 - Symptomatic but completely ambulatory  Day 1, Cycle 4 03/12/22  Temp 98.1 F (36.7 C)  Temp src Oral  Pulse 69  Resp 16  BP 135/68   There were no vitals filed for this visit.   There were no vitals filed for this visit.   Physical Exam Constitutional:      Appearance: Normal appearance. She is normal weight.  Cardiovascular:     Rate and Rhythm: Normal rate and regular rhythm.     Pulses: Normal pulses.     Heart sounds: Normal heart sounds.  Pulmonary:     Effort: Pulmonary effort is normal.     Breath sounds: Normal breath sounds.  Chest:     Comments: Port site appears well  Musculoskeletal:        General: Swelling (Left leg edema, mild) present. No tenderness.     Cervical back: Normal range of motion and neck supple. No rigidity.  Lymphadenopathy:     Cervical: No cervical adenopathy.  Skin:    General: Skin is warm and dry.  Neurological:     General: No focal deficit present.     Mental Status: She is alert.  Psychiatric:        Mood and Affect: Mood normal.    LABORATORY DATA:  I have reviewed the data as listed Lab Results  Component Value Date   WBC 4.9 02/18/2022   HGB 10.8 (L) 02/18/2022   HCT 31.4 (L) 02/18/2022   MCV 91.5 02/18/2022   PLT 202 02/18/2022   Lab Results  Component Value Date   NA 137 02/18/2022   K 4.1 02/18/2022   CL 104 02/18/2022   CO2 28 02/18/2022    RADIOGRAPHIC STUDIES: I have personally reviewed the radiological reports and agreed with the findings in the report.  ASSESSMENT AND PLAN:  Apoorva Bugay is a 76 y.o. female who returns for a follow up for left breast  cancer.   #Left breast IDC: -Grade 2, ER +90% strong staining, PR 0% negative, HER2 positive by IHC 3+, Ki-67 of 30%  -Underwent left breast lumpectomy on 11/15/2021.  -Recommended adjuvant chemotherapy with Paclitaxel and Trastuzumab for 12 cycles  followed by Trastuzumab maintenance for a total of 1 year. Started therapy on 12/10/2021. -Discontinued Paclitaxel with Cycle 4, Day 15 due to neuropathy.  -Due for Cycle 4 of Trastuzumab today. No prohibitive toxicities identified. Okay to proceed with treatment today without any dose modifications.  -Scheduled for consultation and simulation with rad onc on 03/19/2022 for adjuvant radiation.  -RTC in 3 weeks with labs, f/u visit with Dr. Chryl Heck before Cycle 5.   #Neuropathy: --Grade 3, secondary to chemotherapy --Improved with d/c of paclitaxel. --Continue to monitor.   #LLE DVT: --Currently on Eliquis 5 mg twice daily.  --Recommend total of 6 months. Sent refill today.     All questions were answered. The patient knows to call the clinic with any problems, questions or concerns.  I have spent a total of 30 minutes minutes of face-to-face and non-face-to-face time, preparing to see the patient,  performing a medically appropriate examination, counseling and educating the patient, ordering medications, documenting clinical information in the electronic health record,  and care coordination.   Dede Query PA-C Dept of Hematology and [place] at [facility] Phone: [phone]

## 2022-03-14 ENCOUNTER — Encounter: Payer: Self-pay | Admitting: Hematology and Oncology

## 2022-03-14 ENCOUNTER — Encounter: Payer: Self-pay | Admitting: *Deleted

## 2022-03-19 ENCOUNTER — Encounter: Payer: Self-pay | Admitting: Radiation Oncology

## 2022-03-19 ENCOUNTER — Ambulatory Visit
Admission: RE | Admit: 2022-03-19 | Discharge: 2022-03-19 | Disposition: A | Discharge status: Home / Self Care | Payer: Medicare HMO | Source: Ambulatory Visit | Attending: Radiation Oncology | Admitting: Radiation Oncology

## 2022-03-19 ENCOUNTER — Encounter: Payer: Self-pay | Admitting: Hematology and Oncology

## 2022-03-19 VITALS — BP 148/73 | HR 69 | Temp 97.5°F | Resp 18 | Ht 65.5 in | Wt 125.2 lb

## 2022-03-19 DIAGNOSIS — Z170 Estrogen receptor positive status [ER+]: Secondary | ICD-10-CM | POA: Diagnosis not present

## 2022-03-19 DIAGNOSIS — M129 Arthropathy, unspecified: Secondary | ICD-10-CM | POA: Insufficient documentation

## 2022-03-19 DIAGNOSIS — E7800 Pure hypercholesterolemia, unspecified: Secondary | ICD-10-CM | POA: Diagnosis not present

## 2022-03-19 DIAGNOSIS — Z809 Family history of malignant neoplasm, unspecified: Secondary | ICD-10-CM | POA: Insufficient documentation

## 2022-03-19 DIAGNOSIS — Z7901 Long term (current) use of anticoagulants: Secondary | ICD-10-CM | POA: Diagnosis not present

## 2022-03-19 DIAGNOSIS — Z800 Family history of malignant neoplasm of digestive organs: Secondary | ICD-10-CM | POA: Insufficient documentation

## 2022-03-19 DIAGNOSIS — E559 Vitamin D deficiency, unspecified: Secondary | ICD-10-CM | POA: Insufficient documentation

## 2022-03-19 DIAGNOSIS — C50212 Malignant neoplasm of upper-inner quadrant of left female breast: Secondary | ICD-10-CM | POA: Insufficient documentation

## 2022-03-19 DIAGNOSIS — C50912 Malignant neoplasm of unspecified site of left female breast: Secondary | ICD-10-CM

## 2022-03-19 NOTE — Progress Notes (Signed)
Consult nursing interview for Malignant neoplasm of upper-inner quadrant of left breast in female, estrogen receptor positive ([place]).   I verified patient's identity and began nursing interview. Patient reports doing well. No issues conveyed at this time.  Meaningful use complete. NO chances of pregnancy.  BP (!) 148/73 (BP Location: Right Arm, Patient Position: Sitting, Cuff Size: Normal)   Pulse 69   Temp (!) 97.5 F (36.4 C) (Temporal)   Resp 18   Ht 5' 5.5" (1.664 m)   Wt 125 lb 3.2 oz (56.8 kg)   SpO2 100%   BMI 20.52 kg/m    This concludes the interview.   Leandra Kern, LPN

## 2022-03-19 NOTE — Progress Notes (Signed)
Radiation Oncology         [phone] ________________________________  Name: [facility]        MRN: [id number]  Date of Service: 03/19/2022 DOB: 08/14/1945  CC:Nafziger, Cory, NP  Iruku, Praveena, MD     REFERRING PHYSICIAN: Iruku, Praveena, MD   DIAGNOSIS: The encounter diagnosis was Malignant neoplasm of upper-inner quadrant of left breast in female, estrogen receptor positive (H[facility]).   HISTORY OF PRESENT ILLNESS: [facility] is a 76 y.o. female originally seen in the multidisciplinary breast clinic for a new diagnosis of left breast cancer. The patient had a screening mammogram in July 2023 that showed a possible mass in the left breast.  No abnormalities were seen in the right.  She underwent diagnostic mammography and ultrasound on 10/01/2021.  By ultrasound this was seen in the 11 o'clock position measuring up to 2 cm in greatest dimension.  She had an oval mass with angular margins in the left axilla measuring 1.2 cm.  She underwent biopsies of these sites on 10/04/2021.  Final pathology showed grade 2 invasive ductal carcinoma in the left breast specimen.  Her cancer was ER positive PR negative HER2 amplified, her left axillary biopsy showed pseudoangiomatous stromal hyperplasia without evidence of malignancy.    Since her last visit, the patient under went a left lumpectomy with sentinel node biopsy on 11/15/2021 with Dr. Wakefield.  Final pathology showed a grade 3 invasive ductal carcinoma measuring up to 2.4 cm.  Her margins were clear but her closest was 5 mm from the posterior margin.  2 sentinel lymph nodes were benign.  She did not have any genetic mutations, and due to her HER2 amplification she began systemic chemotherapy with targeted anti-HER2 therapy on 12/10/2021.  She completed chemotherapy on 02/11/2022 and continues with single agent Ogivri.  She is seen today to discuss adjuvant radiotherapy treatment, her visit with us was delayed due to  COVID-19.   PREVIOUS RADIATION THERAPY: No   PAST MEDICAL HISTORY:  Past Medical History:  Diagnosis Date   Arthritis    Breast cancer (HCC)    Chicken pox    High cholesterol    PONV (postoperative nausea and vomiting)    Vitamin D deficiency        PAST SURGICAL HISTORY: Past Surgical History:  Procedure Laterality Date   APPENDECTOMY  1959   BACK SURGERY     1996   BREAST LUMPECTOMY WITH AXILLARY LYMPH NODE BIOPSY Left 11/15/2021   Procedure: LEFT BREAST LUMPECTOMY WITH AXILLARY SENTINEL LYMPH NODE BIOPSY;  Surgeon: Wakefield, Matthew, MD;  Location: [facility];  Service: General;  Laterality: Left;   PORTACATH PLACEMENT Right 11/15/2021   Procedure: INSERTION PORT-A-CATH;  Surgeon: Wakefield, Matthew, MD;  Location: [facility];  Service: General;  Laterality: Right;   TONSILLECTOMY AND ADENOIDECTOMY  1954     FAMILY HISTORY:  Family History  Problem Relation Age of Onset   Stomach cancer Mother 70   Heart attack Father 86   Cancer Sister        dx mid 60s; unknown primary w/ mets; treated like ovarian cancer   Heart attack Brother 81   Heart attack Brother 69     SOCIAL HISTORY:  reports that she has never smoked. She has never used smokeless tobacco. She reports current alcohol use of about 1.0 standard drink of alcohol per week. She reports that she does not use drugs. The patient is married and lives in [place]. She   is accompanied by her husband. She is very active.     ALLERGIES: Other, Ampicillin, and Anesthesia s-i-40 [propofol]   MEDICATIONS:  Current Outpatient Medications  Medication Sig Dispense Refill   apixaban (ELIQUIS) 5 MG TABS tablet Take 1 tablet (5 mg total) by mouth 2 (two) times daily. 60 tablet 3   Cholecalciferol (VITAMIN D) 50 MCG (2000 UT) tablet Take 2,000 Units by mouth daily.     lidocaine-prilocaine (EMLA) cream Apply to affected area once 30 g 3   ondansetron (ZOFRAN) 4 MG tablet Take 1 tablet (4  mg total) by mouth daily as needed for nausea or vomiting. 10 tablet 0   ondansetron (ZOFRAN) 8 MG tablet Take 1 tablet (8 mg total) by mouth every 8 (eight) hours as needed for nausea or vomiting. 30 tablet 1   prochlorperazine (COMPAZINE) 10 MG tablet Take 1 tablet (10 mg total) by mouth every 6 (six) hours as needed for nausea or vomiting. 30 tablet 1   traMADol (ULTRAM) 50 MG tablet Take 1 tablet (50 mg total) by mouth every 6 (six) hours as needed. 10 tablet 0   No current facility-administered medications for this encounter.     REVIEW OF SYSTEMS: On review of systems, the patient reports that she is is doing well. Her nails are hyperpigmented and her hair is starting to grow back in. No other complaints are verbalized.      PHYSICAL EXAM:  Wt Readings from Last 3 Encounters:  02/18/22 122 lb 11.2 oz (55.7 kg)  02/11/22 121 lb 8 oz (55.1 kg)  02/04/22 121 lb 4 oz (55 kg)   Temp Readings from Last 3 Encounters:  03/12/22 98.1 F (36.7 C) (Oral)  02/18/22 97.9 F (36.6 C) (Temporal)  02/11/22 97.6 F (36.4 C) (Temporal)   BP Readings from Last 3 Encounters:  03/12/22 135/68  02/18/22 (!) 154/75  02/11/22 133/76   Pulse Readings from Last 3 Encounters:  03/12/22 69  02/18/22 71  02/11/22 75    In general this is a well appearing caucasian female in no acute distress. She's alert and oriented x4 and appropriate throughout the examination. Cardiopulmonary assessment is negative for acute distress and she exhibits normal effort.  Her left breast incision and axillary incision site are both intact without erythema, separation, or drainage.    ECOG = 0  0 - Asymptomatic (Fully active, able to carry on all predisease activities without restriction)  1 - Symptomatic but completely ambulatory (Restricted in physically strenuous activity but ambulatory and able to carry out work of a light or sedentary nature. For example, light housework, office work)  2 - Symptomatic, <50%  in bed during the day (Ambulatory and capable of all self care but unable to carry out any work activities. Up and about more than 50% of waking hours)  3 - Symptomatic, >50% in bed, but not bedbound (Capable of only limited self-care, confined to bed or chair 50% or more of waking hours)  4 - Bedbound (Completely disabled. Cannot carry on any self-care. Totally confined to bed or chair)  5 - Death   Oken MM, Creech RH, Tormey DC, et al. (1982). "Toxicity and response criteria of the Eastern Cooperative Oncology Group". Am. J. Clin. Oncol. 5 (6): 649-55    LABORATORY DATA:  Lab Results  Component Value Date   WBC 4.9 02/18/2022   HGB 10.8 (L) 02/18/2022   HCT 31.4 (L) 02/18/2022   MCV 91.5 02/18/2022   PLT 202 02/18/2022     Lab Results  Component Value Date   NA 137 02/18/2022   K 4.1 02/18/2022   CL 104 02/18/2022   CO2 28 02/18/2022   Lab Results  Component Value Date   ALT 13 02/18/2022   AST 15 02/18/2022   ALKPHOS 55 02/18/2022   BILITOT 0.4 02/18/2022      RADIOGRAPHY: No results found.     IMPRESSION/PLAN: 1. Stage IIA, pT2N0M0, grade 2, ER positive, HER2 amplified invasive ductal carcinoma of the left breast. Dr. Moody has reviewed her final pathology and she and I have also reviewed her course to date.  She has done well since surgery and is now complete with the chemotherapy portion of her systemic treatment.  She will continue HER2 therapy until the fall 2024.  We discussed the rationale for external radiotherapy to the breast  to reduce risks of local recurrence followed by antiestrogen therapy. We discussed the risks, benefits, short, and long term effects of radiotherapy, as well as the curative intent, and the patient is interested in proceeding.  I reviewed the delivery and logistics of radiotherapy and that Dr. Moody recommends 4 weeks of radiotherapy to the left breast with deep inspiration breath hold technique. Written consent is obtained and placed in the  chart, a copy was provided to the patient.  She will simulate today.  In a visit lasting 60 minutes, greater than 50% of the time was spent face to face reviewing her case, as well as in preparation of, discussing, and coordinating the patient's care.     Alison C. Perkins, PAC    **Disclaimer: This note was dictated with voice recognition software. Similar sounding words can inadvertently be transcribed and this note may contain transcription errors which may not have been corrected upon publication of note.** 

## 2022-03-22 DIAGNOSIS — C50212 Malignant neoplasm of upper-inner quadrant of left female breast: Secondary | ICD-10-CM | POA: Diagnosis not present

## 2022-03-22 DIAGNOSIS — Z170 Estrogen receptor positive status [ER+]: Secondary | ICD-10-CM | POA: Diagnosis not present

## 2022-03-26 ENCOUNTER — Ambulatory Visit
Admission: RE | Admit: 2022-03-26 | Discharge: 2022-03-26 | Disposition: A | Discharge status: Home / Self Care | Payer: Medicare HMO | Source: Ambulatory Visit | Attending: Radiation Oncology | Admitting: Radiation Oncology

## 2022-03-26 ENCOUNTER — Other Ambulatory Visit: Payer: Self-pay

## 2022-03-26 DIAGNOSIS — C50212 Malignant neoplasm of upper-inner quadrant of left female breast: Secondary | ICD-10-CM | POA: Diagnosis not present

## 2022-03-26 DIAGNOSIS — Z170 Estrogen receptor positive status [ER+]: Secondary | ICD-10-CM | POA: Diagnosis not present

## 2022-03-26 DIAGNOSIS — Z510 Encounter for antineoplastic radiation therapy: Secondary | ICD-10-CM | POA: Diagnosis not present

## 2022-03-26 LAB — RAD ONC ARIA SESSION SUMMARY
Course Elapsed Days: 0
Plan Fractions Treated to Date: 1
Plan Prescribed Dose Per Fraction: 2.66 Gy
Plan Total Fractions Prescribed: 16
Plan Total Prescribed Dose: 42.56 Gy
Reference Point Dosage Given to Date: 2.66 Gy
Reference Point Session Dosage Given: 2.66 Gy
Session Number: 1

## 2022-03-27 ENCOUNTER — Ambulatory Visit
Admission: RE | Admit: 2022-03-27 | Discharge: 2022-03-27 | Disposition: A | Discharge status: Home / Self Care | Payer: Medicare HMO | Source: Ambulatory Visit | Attending: Radiation Oncology | Admitting: Radiation Oncology

## 2022-03-27 ENCOUNTER — Other Ambulatory Visit: Payer: Self-pay

## 2022-03-27 DIAGNOSIS — Z510 Encounter for antineoplastic radiation therapy: Secondary | ICD-10-CM | POA: Diagnosis not present

## 2022-03-27 DIAGNOSIS — Z170 Estrogen receptor positive status [ER+]: Secondary | ICD-10-CM | POA: Diagnosis not present

## 2022-03-27 DIAGNOSIS — C50212 Malignant neoplasm of upper-inner quadrant of left female breast: Secondary | ICD-10-CM | POA: Diagnosis not present

## 2022-03-27 LAB — RAD ONC ARIA SESSION SUMMARY
Course Elapsed Days: 1
Plan Fractions Treated to Date: 2
Plan Prescribed Dose Per Fraction: 2.66 Gy
Plan Total Fractions Prescribed: 16
Plan Total Prescribed Dose: 42.56 Gy
Reference Point Dosage Given to Date: 5.32 Gy
Reference Point Session Dosage Given: 2.66 Gy
Session Number: 2

## 2022-03-28 ENCOUNTER — Other Ambulatory Visit: Payer: Self-pay

## 2022-03-28 ENCOUNTER — Ambulatory Visit
Admission: RE | Admit: 2022-03-28 | Discharge: 2022-03-28 | Disposition: A | Discharge status: Home / Self Care | Payer: Medicare HMO | Source: Ambulatory Visit | Attending: Radiation Oncology | Admitting: Radiation Oncology

## 2022-03-28 DIAGNOSIS — Z510 Encounter for antineoplastic radiation therapy: Secondary | ICD-10-CM | POA: Diagnosis not present

## 2022-03-28 DIAGNOSIS — Z170 Estrogen receptor positive status [ER+]: Secondary | ICD-10-CM | POA: Diagnosis not present

## 2022-03-28 DIAGNOSIS — C50212 Malignant neoplasm of upper-inner quadrant of left female breast: Secondary | ICD-10-CM | POA: Diagnosis not present

## 2022-03-28 LAB — RAD ONC ARIA SESSION SUMMARY
Course Elapsed Days: 2
Plan Fractions Treated to Date: 3
Plan Prescribed Dose Per Fraction: 2.66 Gy
Plan Total Fractions Prescribed: 16
Plan Total Prescribed Dose: 42.56 Gy
Reference Point Dosage Given to Date: 7.98 Gy
Reference Point Session Dosage Given: 2.66 Gy
Session Number: 3

## 2022-03-29 ENCOUNTER — Ambulatory Visit
Admission: RE | Admit: 2022-03-29 | Discharge: 2022-03-29 | Disposition: A | Discharge status: Home / Self Care | Payer: Medicare HMO | Source: Ambulatory Visit | Attending: Radiation Oncology | Admitting: Radiation Oncology

## 2022-03-29 ENCOUNTER — Other Ambulatory Visit: Payer: Self-pay

## 2022-03-29 DIAGNOSIS — Z170 Estrogen receptor positive status [ER+]: Secondary | ICD-10-CM | POA: Diagnosis not present

## 2022-03-29 DIAGNOSIS — C50212 Malignant neoplasm of upper-inner quadrant of left female breast: Secondary | ICD-10-CM | POA: Diagnosis not present

## 2022-03-29 DIAGNOSIS — Z510 Encounter for antineoplastic radiation therapy: Secondary | ICD-10-CM | POA: Diagnosis not present

## 2022-03-29 LAB — RAD ONC ARIA SESSION SUMMARY
Course Elapsed Days: 3
Plan Fractions Treated to Date: 4
Plan Prescribed Dose Per Fraction: 2.66 Gy
Plan Total Fractions Prescribed: 16
Plan Total Prescribed Dose: 42.56 Gy
Reference Point Dosage Given to Date: 10.64 Gy
Reference Point Session Dosage Given: 2.66 Gy
Session Number: 4

## 2022-03-29 MED ORDER — ALRA NON-METALLIC DEODORANT (RAD-ONC)
1.0000 | Freq: Once | TOPICAL | Status: AC
  Administered 2022-03-29: 1 via TOPICAL

## 2022-03-29 MED ORDER — RADIAPLEXRX EX GEL: Freq: Once | CUTANEOUS | Status: AC

## 2022-04-01 ENCOUNTER — Encounter: Payer: Self-pay | Admitting: Hematology and Oncology

## 2022-04-01 ENCOUNTER — Ambulatory Visit
Admission: RE | Admit: 2022-04-01 | Discharge: 2022-04-01 | Disposition: A | Discharge status: Home / Self Care | Payer: Medicare HMO | Source: Ambulatory Visit | Attending: Radiation Oncology | Admitting: Radiation Oncology

## 2022-04-01 ENCOUNTER — Other Ambulatory Visit: Payer: Self-pay

## 2022-04-01 ENCOUNTER — Inpatient Hospital Stay: Payer: Medicare HMO

## 2022-04-01 ENCOUNTER — Inpatient Hospital Stay: Payer: Medicare HMO | Attending: Hematology and Oncology | Admitting: Hematology and Oncology

## 2022-04-01 VITALS — BP 155/64 | HR 68 | Temp 97.8°F | Resp 16 | Ht 65.0 in | Wt 123.4 lb

## 2022-04-01 DIAGNOSIS — Z95828 Presence of other vascular implants and grafts: Secondary | ICD-10-CM

## 2022-04-01 DIAGNOSIS — Z170 Estrogen receptor positive status [ER+]: Secondary | ICD-10-CM

## 2022-04-01 DIAGNOSIS — C50212 Malignant neoplasm of upper-inner quadrant of left female breast: Secondary | ICD-10-CM | POA: Diagnosis not present

## 2022-04-01 DIAGNOSIS — Z5112 Encounter for antineoplastic immunotherapy: Secondary | ICD-10-CM | POA: Insufficient documentation

## 2022-04-01 DIAGNOSIS — Z510 Encounter for antineoplastic radiation therapy: Secondary | ICD-10-CM | POA: Diagnosis not present

## 2022-04-01 LAB — CMP (CANCER CENTER ONLY)
ALT: 11 U/L (ref 0–44)
AST: 15 U/L (ref 15–41)
Albumin: 4 g/dL (ref 3.5–5.0)
Alkaline Phosphatase: 86 U/L (ref 38–126)
Anion gap: 5 (ref 5–15)
BUN: 20 mg/dL (ref 8–23)
CO2: 28 mmol/L (ref 22–32)
Calcium: 9.7 mg/dL (ref 8.9–10.3)
Chloride: 105 mmol/L (ref 98–111)
Creatinine: 0.6 mg/dL (ref 0.44–1.00)
GFR, Estimated: >60 mL/min (ref >60)
Glucose, Bld: 124 mg/dL — ABNORMAL HIGH (ref 70–99)
Potassium: 4.1 mmol/L (ref 3.5–5.1)
Sodium: 138 mmol/L (ref 135–145)
Total Bilirubin: 0.5 mg/dL (ref 0.3–1.2)
Total Protein: 6.8 g/dL (ref 6.5–8.1)

## 2022-04-01 LAB — RAD ONC ARIA SESSION SUMMARY
Course Elapsed Days: 6
Plan Fractions Treated to Date: 5
Plan Prescribed Dose Per Fraction: 2.66 Gy
Plan Total Fractions Prescribed: 16
Plan Total Prescribed Dose: 42.56 Gy
Reference Point Dosage Given to Date: 13.3 Gy
Reference Point Session Dosage Given: 2.66 Gy
Session Number: 5

## 2022-04-01 LAB — CBC WITH DIFFERENTIAL (CANCER CENTER ONLY)
Abs Immature Granulocytes: 0 10*3/uL (ref 0.00–0.07)
Basophils Absolute: 0 10*3/uL (ref 0.0–0.1)
Basophils Relative: 1 %
Eosinophils Absolute: 0.1 10*3/uL (ref 0.0–0.5)
Eosinophils Relative: 2 %
HCT: 34.7 % — ABNORMAL LOW (ref 36.0–46.0)
Hemoglobin: 11.9 g/dL — ABNORMAL LOW (ref 12.0–15.0)
Immature Granulocytes: 0 %
Lymphocytes Relative: 28 %
Lymphs Abs: 1.4 10*3/uL (ref 0.7–4.0)
MCH: 30.9 pg (ref 26.0–34.0)
MCHC: 34.3 g/dL (ref 30.0–36.0)
MCV: 90.1 fL (ref 80.0–100.0)
Monocytes Absolute: 0.5 10*3/uL (ref 0.1–1.0)
Monocytes Relative: 9 %
Neutro Abs: 3 10*3/uL (ref 1.7–7.7)
Neutrophils Relative %: 60 %
Platelet Count: 174 10*3/uL (ref 150–400)
RBC: 3.85 MIL/uL — ABNORMAL LOW (ref 3.87–5.11)
RDW: 12.9 % (ref 11.5–15.5)
WBC Count: 5 10*3/uL (ref 4.0–10.5)
nRBC: 0 % (ref 0.0–0.2)

## 2022-04-01 MED ORDER — SODIUM CHLORIDE 0.9 % IV SOLN: Freq: Once | INTRAVENOUS | Status: AC

## 2022-04-01 MED ORDER — LORATADINE 10 MG PO TABS
10.0000 mg | ORAL_TABLET | Freq: Every day | ORAL | Status: DC
  Administered 2022-04-01: 10 mg via ORAL
  Filled 2022-04-01: qty 1

## 2022-04-01 MED ORDER — ACETAMINOPHEN 325 MG PO TABS
650.0000 mg | ORAL_TABLET | Freq: Once | ORAL | Status: AC
  Administered 2022-04-01: 650 mg via ORAL
  Filled 2022-04-01: qty 2

## 2022-04-01 MED ORDER — SODIUM CHLORIDE 0.9% FLUSH
10.0000 mL | Freq: Once | INTRAVENOUS | Status: AC
  Administered 2022-04-01: 10 mL

## 2022-04-01 MED ORDER — TRASTUZUMAB-ANNS CHEMO 150 MG IV SOLR
6.0000 mg/kg | Freq: Once | INTRAVENOUS | Status: AC
  Administered 2022-04-01: 300 mg via INTRAVENOUS
  Filled 2022-04-01: qty 14.29

## 2022-04-01 NOTE — Progress Notes (Signed)
The following biosimilar Kanjinti (trastuzumab-anns) has been selected for use in this patient.  Kennith Center, Pharm.D., CPP 04/01/2022'@8'$ :14 AM

## 2022-04-01 NOTE — Progress Notes (Signed)
[facility] PROGRESS NOTE  Patient Care Team: Dorothyann Peng, NP as PCP - General (Family Medicine) Rolm Bookbinder, MD as Consulting Physician (General Surgery) Benay Pike, MD as Consulting Physician (Hematology and Oncology) Kyung Rudd, MD as Consulting Physician (Radiation Oncology) Rockwell Germany, RN as Oncology Nurse Navigator Mauro Kaufmann, RN as Oncology Nurse Navigator  CHIEF COMPLAINTS/PURPOSE OF CONSULTATION:  Left breast cancer  SUMMARY OF ONCOLOGIC HISTORY: Oncology History  Malignant neoplasm of upper-inner quadrant of left breast in female, estrogen receptor positive ([place])  09/20/2021 Mammogram   Screening mammogram showed extremely dense breasts and a possible mass in the left breast warranting further evaluation.  Breast density category is D Diagnostic mammogram of the breast showed indeterminate mass in the 11:00 location of the left breast.  Indeterminate mass in the lower anterior left axilla.   10/04/2021 Pathology Results   Pathology showed grade 2 invasive ductal carcinoma, left axillary soft tissue needle core biopsy showed benign breast tissue with patchy changes prognostics from the breast tumor showed ER 90% positive strong staining PR 0%, negative, Ki-67 of 30% and HER2 positive for IHC 3+   10/19/2021 Initial Diagnosis   Malignant neoplasm of upper-inner quadrant of left breast in female, estrogen receptor positive ([place])   10/23/2021 Cancer Staging   Staging form: Breast, AJCC 8th Edition - Pathologic: Stage IIA (pT2, pN0, cM0, G3, ER+, PR-, HER2+) - Signed by Benay Pike, MD on 11/26/2021 Histologic grading system: 3 grade system   11/10/2021 Genetic Testing   Negative hereditary cancer genetic testing: no pathogenic variants detected in [facility] CancerNext-Expanded +RNA Panel.  Report date is November 10, 2021.   The CancerNext-Expanded gene panel offered by [facility] and includes sequencing, rearrangement, and RNA analysis for  the following 77 genes: AIP, ALK, APC, ATM, AXIN2, BAP1, BARD1, BLM, BMPR1A, BRCA1, BRCA2, BRIP1, CDC73, CDH1, CDK4, CDKN1B, CDKN2A, CHEK2, CTNNA1, DICER1, FANCC, FH, FLCN, GALNT12, KIF1B, LZTR1, MAX, MEN1, MET, MLH1, MSH2, MSH3, MSH6, MUTYH, NBN, NF1, NF2, NTHL1, PALB2, PHOX2B, PMS2, POT1, PRKAR1A, PTCH1, PTEN, RAD51C, RAD51D, RB1, RECQL, RET, SDHA, SDHAF2, SDHB, SDHC, SDHD, SMAD4, SMARCA4, SMARCB1, SMARCE1, STK11, SUFU, TMEM127, TP53, TSC1, TSC2, VHL and XRCC2 (sequencing and deletion/duplication); EGFR, EGLN1, HOXB13, KIT, MITF, PDGFRA, POLD1, and POLE (sequencing only); EPCAM and GREM1 (deletion/duplication only).    11/15/2021 Surgery   Left breast lumpectomy: 2.4 cm grade 3 invasive ductal carcinoma, margins -2 sentinel lymph nodes negative.   12/10/2021 -  Chemotherapy   Patient is on Treatment Plan : BREAST Paclitaxel + Trastuzumab q7d / Trastuzumab q21d      INTERVAL HISTORY:  [name] returns for a follow up before maintenance trastuzumab. She reports some flakiness of the scalp and wonders if she can take anything for it. Neuropathy is better, mostly at the finger tips, no more dropping things. Energy is better since she stopped chemotherapy. She was also wondering if there is role for repeat US leg. She has several good questions.  Rest of the pertinent 10 point ROS reviewed and negative  MEDICAL HISTORY:  Past Medical History:  Diagnosis Date   Arthritis    Breast cancer ([place])    Chicken pox    High cholesterol    PONV (postoperative nausea and vomiting)    Vitamin D deficiency     SURGICAL HISTORY: Past Surgical History:  Procedure Laterality Date   [place] LUMPECTOMY WITH AXILLARY LYMPH NODE BIOPSY Left 11/15/2021  Procedure: LEFT BREAST LUMPECTOMY WITH AXILLARY SENTINEL LYMPH NODE BIOPSY;  Surgeon: Rolm Bookbinder, MD;  Location: [facility];  Service: General;  Laterality: Left;   PORTACATH  PLACEMENT Right 11/15/2021   Procedure: INSERTION PORT-A-CATH;  Surgeon: Rolm Bookbinder, MD;  Location: [facility];  Service: General;  Laterality: Right;   TONSILLECTOMY AND ADENOIDECTOMY  1954    SOCIAL HISTORY: Social History   Socioeconomic History   Marital status: Married    Spouse name: Not on file   Number of children: Not on file   Years of education: Not on file   Highest education level: Not on file  Occupational History   Not on file  Tobacco Use   Smoking status: Never   Smokeless tobacco: Never  Vaping Use   Vaping Use: Never used  Substance and Sexual Activity   Alcohol use: Yes    Alcohol/week: 1.0 standard drink of alcohol    Types: 1 Glasses of wine per week   Drug use: Never   Sexual activity: Not on file  Other Topics Concern   Not on file  Social History Narrative   Married    Retired    Investment banker, operational of Radio broadcast assistant Strain: [place]  (09/03/2021)   Overall Financial Resource Strain (CARDIA)    Difficulty of Paying Living Expenses: Not hard at all  Food Insecurity: No Food Insecurity (09/03/2021)   Hunger Vital Sign    Worried About Running Out of Food in the Last Year: Never true    [place] in the Last Year: Never true  Transportation Needs: No Transportation Needs (09/03/2021)   PRAPARE - Hydrologist (Medical): No    Lack of Transportation (Non-Medical): No  Physical Activity: Sufficiently Active (09/03/2021)   Exercise Vital Sign    Days of Exercise per Week: 4 days    Minutes of Exercise per Session: 120 min  Stress: No Stress Concern Present (09/03/2021)   [place]    Feeling of Stress : Not at all  Social Connections: [place] (09/03/2021)   Social Connection and Isolation Panel [NHANES]    Frequency of Communication with Friends and Family: More than three times a week    Frequency of  Social Gatherings with Friends and Family: More than three times a week    Attends Religious Services: More than 4 times per year    Active Member of [employer] or Organizations: Yes    Attends Music therapist: More than 4 times per year    Marital Status: Married  Human resources officer Violence: Not At Risk (09/03/2021)   Humiliation, Afraid, Rape, and Kick questionnaire    Fear of Current or Ex-Partner: No    Emotionally Abused: No    Physically Abused: No    Sexually Abused: No    FAMILY HISTORY: Family History  Problem Relation Age of Onset   Stomach cancer Mother 33   Heart attack Father 31   Cancer Sister        dx mid 32s; unknown primary w/ mets; treated like ovarian cancer   Heart attack Brother 32   Heart attack Brother 69    ALLERGIES:  is allergic to other, ampicillin, and anesthesia [id number] [propofol].  MEDICATIONS:  Current Outpatient Medications  Medication Sig Dispense Refill   apixaban (ELIQUIS) 5 MG TABS tablet Take 1 tablet (5 mg total) by mouth 2 (two) times  daily. 60 tablet 3   Cholecalciferol (VITAMIN D) 50 MCG (2000 UT) tablet Take 2,000 Units by mouth daily.     lidocaine-prilocaine (EMLA) cream Apply to affected area once 30 g 3   ondansetron (ZOFRAN) 4 MG tablet Take 1 tablet (4 mg total) by mouth daily as needed for nausea or vomiting. 10 tablet 0   ondansetron (ZOFRAN) 8 MG tablet Take 1 tablet (8 mg total) by mouth every 8 (eight) hours as needed for nausea or vomiting. 30 tablet 1   prochlorperazine (COMPAZINE) 10 MG tablet Take 1 tablet (10 mg total) by mouth every 6 (six) hours as needed for nausea or vomiting. 30 tablet 1   traMADol (ULTRAM) 50 MG tablet Take 1 tablet (50 mg total) by mouth every 6 (six) hours as needed. 10 tablet 0   No current facility-administered medications for this visit.    PHYSICAL EXAMINATION: ECOG PERFORMANCE STATUS: 1 - Symptomatic but completely ambulatory  Day 1, Cycle 4 03/12/22  Temp 98.1 F (36.7 C)   Temp src Oral  Pulse 69  Resp 16  BP 135/68   Vitals:   04/01/22 1318  BP: (!) 155/64  Pulse: 68  Resp: 16  Temp: 97.8 F (36.6 C)  SpO2: 100%     Filed Weights   04/01/22 1318  Weight: 123 lb 6.4 oz (56 kg)     Physical Exam Constitutional:      Appearance: Normal appearance. She is normal weight.  Cardiovascular:     Rate and Rhythm: Normal rate and regular rhythm.     Pulses: Normal pulses.     Heart sounds: Normal heart sounds.  Pulmonary:     Effort: Pulmonary effort is normal.     Breath sounds: Normal breath sounds.  Chest:     Comments: Port site appears well  Musculoskeletal:        General: Swelling (Left leg edema, mild) present. No tenderness.     Cervical back: Normal range of motion and neck supple. No rigidity.  Lymphadenopathy:     Cervical: No cervical adenopathy.  Skin:    General: Skin is warm and dry.  Neurological:     General: No focal deficit present.     Mental Status: She is alert.  Psychiatric:        Mood and Affect: Mood normal.    LABORATORY DATA:  I have reviewed the data as listed Lab Results  Component Value Date   WBC 5.0 04/01/2022   HGB 11.9 (L) 04/01/2022   HCT 34.7 (L) 04/01/2022   MCV 90.1 04/01/2022   PLT 174 04/01/2022   Lab Results  Component Value Date   NA 137 02/18/2022   K 4.1 02/18/2022   CL 104 02/18/2022   CO2 28 02/18/2022    RADIOGRAPHIC STUDIES: I have personally reviewed the radiological reports and agreed with the findings in the report.  ASSESSMENT AND PLAN:  Patricia Marshall is a 77 y.o. female who returns for a follow up for left breast cancer.   #Left breast IDC: -Grade 2, ER +90% strong staining, PR 0% negative, HER2 positive by IHC 3+, Ki-67 of 30%  -Underwent left breast lumpectomy on 11/15/2021.  -Recommended adjuvant chemotherapy with Paclitaxel and Trastuzumab for 12 cycles followed by Trastuzumab maintenance for a total of 1 year. Started therapy on  12/10/2021. -Discontinued Paclitaxel with Cycle 4, Day 15 due to neuropathy.  -Due for Cycle 5 of Trastuzumab today. No prohibitive toxicities identified.  -Okay to proceed with  treatment today without any dose modifications.  -Continue radiation as recommended --She wanted to change dates in February for herceptin. --Ok to proceed with herceptin, rtc with me in 6 weeks. --ECHO due in February  #Neuropathy: --Grade 1, improved significantly  #LLE DVT: --Currently on Eliquis 5 mg twice daily.  --Recommend total of 6 months. Sent refill today.  --Again, they had good questions about anticoagulation duration, role of re imaging.   All questions were answered. The patient knows to call the clinic with any problems, questions or concerns.  I have spent a total of 30 minutes minutes of face-to-face and non-face-to-face time, preparing to see the patient,  performing a medically appropriate examination, counseling and educating the patient, ordering medications, documenting clinical information in the electronic health record,  and care coordination.

## 2022-04-01 NOTE — Patient Instructions (Signed)
[facility]  Discharge Instructions: Thank you for choosing [facility] to provide your oncology and hematology care.   If you have a lab appointment with the [facility], please go directly to the [facility] and check in at the registration area.   Wear comfortable clothing and clothing appropriate for easy access to any Portacath or PICC line.   We strive to give you quality time with your provider. You may need to reschedule your appointment if you arrive late (15 or more minutes).  Arriving late affects you and other patients whose appointments are after yours.  Also, if you miss three or more appointments without notifying the office, you may be dismissed from the clinic at the provider's discretion.      For prescription refill requests, have your pharmacy contact our office and allow 72 hours for refills to be completed.    Today you received the following chemotherapy and/or immunotherapy agents herceptin      To help prevent nausea and vomiting after your treatment, we encourage you to take your nausea medication as directed.  BELOW ARE SYMPTOMS THAT SHOULD BE REPORTED IMMEDIATELY: *FEVER GREATER THAN 100.4 F (38 C) OR HIGHER *CHILLS OR SWEATING *NAUSEA AND VOMITING THAT IS NOT CONTROLLED WITH YOUR NAUSEA MEDICATION *UNUSUAL SHORTNESS OF BREATH *UNUSUAL BRUISING OR BLEEDING *URINARY PROBLEMS (pain or burning when urinating, or frequent urination) *BOWEL PROBLEMS (unusual diarrhea, constipation, pain near the anus) TENDERNESS IN MOUTH AND THROAT WITH OR WITHOUT PRESENCE OF ULCERS (sore throat, sores in mouth, or a toothache) UNUSUAL RASH, SWELLING OR PAIN  UNUSUAL VAGINAL DISCHARGE OR ITCHING   Items with * indicate a potential emergency and should be followed up as soon as possible or go to the Emergency Department if any problems should occur.  Please show the CHEMOTHERAPY ALERT CARD or IMMUNOTHERAPY ALERT CARD at check-in to  the Emergency Department and triage nurse.  Should you have questions after your visit or need to cancel or reschedule your appointment, please contact [facility]  Dept: [phone]  and follow the prompts.  Office hours are 8:00 a.m. to 4:30 p.m. Monday - Friday. Please note that voicemails left after 4:00 p.m. may not be returned until the following business day.  We are closed weekends and major holidays. You have access to a nurse at all times for urgent questions. Please call the main number to the clinic Dept: [phone] and follow the prompts.   For any non-urgent questions, you may also contact your provider using MyChart. We now offer e-Visits for anyone 86 and older to request care online for non-urgent symptoms. For details visit [email or web address].   Also download the MyChart app! Go to the app store, search "MyChart", open the app, select [facility], and log in with your MyChart username and password.  Masks are optional in the cancer centers. If you would like for your care team to wear a mask while they are taking care of you, please let them know. You may have one support person who is at least 76 years old accompany you for your appointments.

## 2022-04-02 ENCOUNTER — Ambulatory Visit
Admission: RE | Admit: 2022-04-02 | Discharge: 2022-04-02 | Disposition: A | Discharge status: Home / Self Care | Payer: Medicare HMO | Source: Ambulatory Visit | Attending: Radiation Oncology | Admitting: Radiation Oncology

## 2022-04-02 ENCOUNTER — Other Ambulatory Visit: Payer: Self-pay

## 2022-04-02 DIAGNOSIS — C50212 Malignant neoplasm of upper-inner quadrant of left female breast: Secondary | ICD-10-CM | POA: Diagnosis not present

## 2022-04-02 DIAGNOSIS — Z170 Estrogen receptor positive status [ER+]: Secondary | ICD-10-CM | POA: Diagnosis not present

## 2022-04-02 DIAGNOSIS — Z510 Encounter for antineoplastic radiation therapy: Secondary | ICD-10-CM | POA: Diagnosis not present

## 2022-04-02 LAB — RAD ONC ARIA SESSION SUMMARY
Course Elapsed Days: 7
Plan Fractions Treated to Date: 6
Plan Prescribed Dose Per Fraction: 2.66 Gy
Plan Total Fractions Prescribed: 16
Plan Total Prescribed Dose: 42.56 Gy
Reference Point Dosage Given to Date: 15.96 Gy
Reference Point Session Dosage Given: 2.66 Gy
Session Number: 6

## 2022-04-03 ENCOUNTER — Ambulatory Visit
Admission: RE | Admit: 2022-04-03 | Discharge: 2022-04-03 | Disposition: A | Discharge status: Home / Self Care | Payer: Medicare HMO | Source: Ambulatory Visit | Attending: Radiation Oncology | Admitting: Radiation Oncology

## 2022-04-03 ENCOUNTER — Other Ambulatory Visit: Payer: Self-pay

## 2022-04-03 ENCOUNTER — Encounter: Payer: Self-pay | Admitting: Hematology and Oncology

## 2022-04-03 DIAGNOSIS — Z170 Estrogen receptor positive status [ER+]: Secondary | ICD-10-CM | POA: Diagnosis not present

## 2022-04-03 DIAGNOSIS — C50212 Malignant neoplasm of upper-inner quadrant of left female breast: Secondary | ICD-10-CM | POA: Diagnosis not present

## 2022-04-03 DIAGNOSIS — Z510 Encounter for antineoplastic radiation therapy: Secondary | ICD-10-CM | POA: Diagnosis not present

## 2022-04-03 LAB — RAD ONC ARIA SESSION SUMMARY
Course Elapsed Days: 8
Plan Fractions Treated to Date: 7
Plan Prescribed Dose Per Fraction: 2.66 Gy
Plan Total Fractions Prescribed: 16
Plan Total Prescribed Dose: 42.56 Gy
Reference Point Dosage Given to Date: 18.62 Gy
Reference Point Session Dosage Given: 2.66 Gy
Session Number: 7

## 2022-04-04 ENCOUNTER — Encounter: Payer: Self-pay | Admitting: *Deleted

## 2022-04-04 ENCOUNTER — Ambulatory Visit
Admission: RE | Admit: 2022-04-04 | Discharge: 2022-04-04 | Disposition: A | Discharge status: Home / Self Care | Payer: Medicare HMO | Source: Ambulatory Visit | Attending: Radiation Oncology | Admitting: Radiation Oncology

## 2022-04-04 ENCOUNTER — Other Ambulatory Visit: Payer: Self-pay

## 2022-04-04 DIAGNOSIS — Z510 Encounter for antineoplastic radiation therapy: Secondary | ICD-10-CM | POA: Diagnosis not present

## 2022-04-04 DIAGNOSIS — C50212 Malignant neoplasm of upper-inner quadrant of left female breast: Secondary | ICD-10-CM | POA: Diagnosis not present

## 2022-04-04 DIAGNOSIS — Z170 Estrogen receptor positive status [ER+]: Secondary | ICD-10-CM | POA: Diagnosis not present

## 2022-04-04 LAB — RAD ONC ARIA SESSION SUMMARY
Course Elapsed Days: 9
Plan Fractions Treated to Date: 8
Plan Prescribed Dose Per Fraction: 2.66 Gy
Plan Total Fractions Prescribed: 16
Plan Total Prescribed Dose: 42.56 Gy
Reference Point Dosage Given to Date: 21.28 Gy
Reference Point Session Dosage Given: 2.66 Gy
Session Number: 8

## 2022-04-05 ENCOUNTER — Ambulatory Visit
Admission: RE | Admit: 2022-04-05 | Discharge: 2022-04-05 | Disposition: A | Discharge status: Home / Self Care | Payer: Medicare HMO | Source: Ambulatory Visit | Attending: Radiation Oncology | Admitting: Radiation Oncology

## 2022-04-05 ENCOUNTER — Other Ambulatory Visit: Payer: Self-pay

## 2022-04-05 DIAGNOSIS — Z170 Estrogen receptor positive status [ER+]: Secondary | ICD-10-CM | POA: Diagnosis not present

## 2022-04-05 DIAGNOSIS — C50212 Malignant neoplasm of upper-inner quadrant of left female breast: Secondary | ICD-10-CM | POA: Diagnosis not present

## 2022-04-05 DIAGNOSIS — Z510 Encounter for antineoplastic radiation therapy: Secondary | ICD-10-CM | POA: Diagnosis not present

## 2022-04-05 LAB — RAD ONC ARIA SESSION SUMMARY
Course Elapsed Days: 10
Plan Fractions Treated to Date: 9
Plan Prescribed Dose Per Fraction: 2.66 Gy
Plan Total Fractions Prescribed: 16
Plan Total Prescribed Dose: 42.56 Gy
Reference Point Dosage Given to Date: 23.94 Gy
Reference Point Session Dosage Given: 2.66 Gy
Session Number: 9

## 2022-04-08 ENCOUNTER — Other Ambulatory Visit: Payer: Self-pay

## 2022-04-08 ENCOUNTER — Ambulatory Visit
Admission: RE | Admit: 2022-04-08 | Discharge: 2022-04-08 | Disposition: A | Discharge status: Home / Self Care | Payer: Medicare HMO | Source: Ambulatory Visit | Attending: Radiation Oncology | Admitting: Radiation Oncology

## 2022-04-08 DIAGNOSIS — Z170 Estrogen receptor positive status [ER+]: Secondary | ICD-10-CM | POA: Diagnosis not present

## 2022-04-08 DIAGNOSIS — Z510 Encounter for antineoplastic radiation therapy: Secondary | ICD-10-CM | POA: Diagnosis not present

## 2022-04-08 DIAGNOSIS — C50212 Malignant neoplasm of upper-inner quadrant of left female breast: Secondary | ICD-10-CM | POA: Diagnosis not present

## 2022-04-08 LAB — RAD ONC ARIA SESSION SUMMARY
Course Elapsed Days: 13
Plan Fractions Treated to Date: 10
Plan Prescribed Dose Per Fraction: 2.66 Gy
Plan Total Fractions Prescribed: 16
Plan Total Prescribed Dose: 42.56 Gy
Reference Point Dosage Given to Date: 26.6 Gy
Reference Point Session Dosage Given: 2.66 Gy
Session Number: 10

## 2022-04-09 ENCOUNTER — Other Ambulatory Visit: Payer: Self-pay

## 2022-04-09 ENCOUNTER — Ambulatory Visit
Admission: RE | Admit: 2022-04-09 | Discharge: 2022-04-09 | Disposition: A | Discharge status: Home / Self Care | Payer: Medicare HMO | Source: Ambulatory Visit | Attending: Radiation Oncology | Admitting: Radiation Oncology

## 2022-04-09 DIAGNOSIS — Z510 Encounter for antineoplastic radiation therapy: Secondary | ICD-10-CM | POA: Diagnosis not present

## 2022-04-09 DIAGNOSIS — C50212 Malignant neoplasm of upper-inner quadrant of left female breast: Secondary | ICD-10-CM | POA: Diagnosis not present

## 2022-04-09 DIAGNOSIS — Z170 Estrogen receptor positive status [ER+]: Secondary | ICD-10-CM | POA: Diagnosis not present

## 2022-04-09 LAB — RAD ONC ARIA SESSION SUMMARY
Course Elapsed Days: 14
Plan Fractions Treated to Date: 11
Plan Prescribed Dose Per Fraction: 2.66 Gy
Plan Total Fractions Prescribed: 16
Plan Total Prescribed Dose: 42.56 Gy
Reference Point Dosage Given to Date: 29.26 Gy
Reference Point Session Dosage Given: 2.66 Gy
Session Number: 11

## 2022-04-10 ENCOUNTER — Other Ambulatory Visit: Payer: Self-pay

## 2022-04-10 ENCOUNTER — Ambulatory Visit
Admission: RE | Admit: 2022-04-10 | Discharge: 2022-04-10 | Disposition: A | Discharge status: Home / Self Care | Payer: Medicare HMO | Source: Ambulatory Visit | Attending: Radiation Oncology | Admitting: Radiation Oncology

## 2022-04-10 DIAGNOSIS — Z510 Encounter for antineoplastic radiation therapy: Secondary | ICD-10-CM | POA: Diagnosis not present

## 2022-04-10 DIAGNOSIS — C50212 Malignant neoplasm of upper-inner quadrant of left female breast: Secondary | ICD-10-CM | POA: Diagnosis not present

## 2022-04-10 DIAGNOSIS — Z170 Estrogen receptor positive status [ER+]: Secondary | ICD-10-CM | POA: Diagnosis not present

## 2022-04-10 LAB — RAD ONC ARIA SESSION SUMMARY
Course Elapsed Days: 15
Plan Fractions Treated to Date: 12
Plan Prescribed Dose Per Fraction: 2.66 Gy
Plan Total Fractions Prescribed: 16
Plan Total Prescribed Dose: 42.56 Gy
Reference Point Dosage Given to Date: 31.92 Gy
Reference Point Session Dosage Given: 2.66 Gy
Session Number: 12

## 2022-04-11 ENCOUNTER — Other Ambulatory Visit: Payer: Self-pay

## 2022-04-11 ENCOUNTER — Ambulatory Visit
Admission: RE | Admit: 2022-04-11 | Discharge: 2022-04-11 | Disposition: A | Discharge status: Home / Self Care | Payer: Medicare HMO | Source: Ambulatory Visit | Attending: Radiation Oncology | Admitting: Radiation Oncology

## 2022-04-11 DIAGNOSIS — C50212 Malignant neoplasm of upper-inner quadrant of left female breast: Secondary | ICD-10-CM | POA: Diagnosis not present

## 2022-04-11 DIAGNOSIS — Z170 Estrogen receptor positive status [ER+]: Secondary | ICD-10-CM | POA: Diagnosis not present

## 2022-04-11 DIAGNOSIS — Z510 Encounter for antineoplastic radiation therapy: Secondary | ICD-10-CM | POA: Diagnosis not present

## 2022-04-11 LAB — RAD ONC ARIA SESSION SUMMARY
Course Elapsed Days: 16
Plan Fractions Treated to Date: 13
Plan Prescribed Dose Per Fraction: 2.66 Gy
Plan Total Fractions Prescribed: 16
Plan Total Prescribed Dose: 42.56 Gy
Reference Point Dosage Given to Date: 34.58 Gy
Reference Point Session Dosage Given: 2.66 Gy
Session Number: 13

## 2022-04-12 ENCOUNTER — Ambulatory Visit
Admission: RE | Admit: 2022-04-12 | Discharge: 2022-04-12 | Disposition: A | Discharge status: Home / Self Care | Payer: Medicare HMO | Source: Ambulatory Visit | Attending: Radiation Oncology | Admitting: Radiation Oncology

## 2022-04-12 ENCOUNTER — Other Ambulatory Visit: Payer: Self-pay

## 2022-04-12 ENCOUNTER — Ambulatory Visit: Payer: Medicare HMO | Admitting: Radiation Oncology

## 2022-04-12 DIAGNOSIS — Z170 Estrogen receptor positive status [ER+]: Secondary | ICD-10-CM | POA: Diagnosis not present

## 2022-04-12 DIAGNOSIS — C50212 Malignant neoplasm of upper-inner quadrant of left female breast: Secondary | ICD-10-CM | POA: Diagnosis not present

## 2022-04-12 DIAGNOSIS — Z510 Encounter for antineoplastic radiation therapy: Secondary | ICD-10-CM | POA: Diagnosis not present

## 2022-04-12 LAB — RAD ONC ARIA SESSION SUMMARY
Course Elapsed Days: 17
Plan Fractions Treated to Date: 14
Plan Prescribed Dose Per Fraction: 2.66 Gy
Plan Total Fractions Prescribed: 16
Plan Total Prescribed Dose: 42.56 Gy
Reference Point Dosage Given to Date: 37.24 Gy
Reference Point Session Dosage Given: 2.66 Gy
Session Number: 14

## 2022-04-12 MED ORDER — RADIAPLEXRX EX GEL: Freq: Once | CUTANEOUS | Status: AC

## 2022-04-15 ENCOUNTER — Other Ambulatory Visit: Payer: Self-pay

## 2022-04-15 ENCOUNTER — Ambulatory Visit
Admission: RE | Admit: 2022-04-15 | Discharge: 2022-04-15 | Disposition: A | Discharge status: Home / Self Care | Payer: Medicare HMO | Source: Ambulatory Visit | Attending: Radiation Oncology | Admitting: Radiation Oncology

## 2022-04-15 DIAGNOSIS — Z510 Encounter for antineoplastic radiation therapy: Secondary | ICD-10-CM | POA: Diagnosis not present

## 2022-04-15 DIAGNOSIS — Z170 Estrogen receptor positive status [ER+]: Secondary | ICD-10-CM | POA: Diagnosis not present

## 2022-04-15 DIAGNOSIS — C50212 Malignant neoplasm of upper-inner quadrant of left female breast: Secondary | ICD-10-CM | POA: Diagnosis not present

## 2022-04-15 LAB — RAD ONC ARIA SESSION SUMMARY
Course Elapsed Days: 20
Plan Fractions Treated to Date: 15
Plan Prescribed Dose Per Fraction: 2.66 Gy
Plan Total Fractions Prescribed: 16
Plan Total Prescribed Dose: 42.56 Gy
Reference Point Dosage Given to Date: 39.9 Gy
Reference Point Session Dosage Given: 2.66 Gy
Session Number: 15

## 2022-04-16 ENCOUNTER — Ambulatory Visit
Admission: RE | Admit: 2022-04-16 | Discharge: 2022-04-16 | Disposition: A | Discharge status: Home / Self Care | Payer: Medicare HMO | Source: Ambulatory Visit | Attending: Radiation Oncology | Admitting: Radiation Oncology

## 2022-04-16 ENCOUNTER — Other Ambulatory Visit: Payer: Self-pay

## 2022-04-16 ENCOUNTER — Telehealth: Payer: Self-pay | Admitting: *Deleted

## 2022-04-16 DIAGNOSIS — Z510 Encounter for antineoplastic radiation therapy: Secondary | ICD-10-CM | POA: Diagnosis not present

## 2022-04-16 DIAGNOSIS — C50212 Malignant neoplasm of upper-inner quadrant of left female breast: Secondary | ICD-10-CM | POA: Diagnosis not present

## 2022-04-16 DIAGNOSIS — Z170 Estrogen receptor positive status [ER+]: Secondary | ICD-10-CM | POA: Diagnosis not present

## 2022-04-16 LAB — RAD ONC ARIA SESSION SUMMARY
Course Elapsed Days: 21
Plan Fractions Treated to Date: 16
Plan Prescribed Dose Per Fraction: 2.66 Gy
Plan Total Fractions Prescribed: 16
Plan Total Prescribed Dose: 42.56 Gy
Reference Point Dosage Given to Date: 42.56 Gy
Reference Point Session Dosage Given: 2.66 Gy
Session Number: 16

## 2022-04-16 NOTE — Telephone Encounter (Signed)
Returned call to the patient to let her know we can give her a final treatment sheet on her last day of treatment Monday.  We will give hear tube of cream on Friday when she comes around for her last under treatment visit.  We discussed post radiation treatment skin care and will discuss more on Friday.  She verbalized understanding.  Gloriajean Dell. Leonie Green, BSN

## 2022-04-17 ENCOUNTER — Other Ambulatory Visit: Payer: Self-pay

## 2022-04-17 ENCOUNTER — Ambulatory Visit
Admission: RE | Admit: 2022-04-17 | Discharge: 2022-04-17 | Disposition: A | Discharge status: Home / Self Care | Payer: Medicare HMO | Source: Ambulatory Visit | Attending: Radiation Oncology | Admitting: Radiation Oncology

## 2022-04-17 DIAGNOSIS — Z510 Encounter for antineoplastic radiation therapy: Secondary | ICD-10-CM | POA: Diagnosis not present

## 2022-04-17 DIAGNOSIS — C50212 Malignant neoplasm of upper-inner quadrant of left female breast: Secondary | ICD-10-CM | POA: Diagnosis not present

## 2022-04-17 DIAGNOSIS — Z170 Estrogen receptor positive status [ER+]: Secondary | ICD-10-CM | POA: Diagnosis not present

## 2022-04-17 LAB — RAD ONC ARIA SESSION SUMMARY
Course Elapsed Days: 22
Plan Fractions Treated to Date: 1
Plan Prescribed Dose Per Fraction: 2 Gy
Plan Total Fractions Prescribed: 4
Plan Total Prescribed Dose: 8 Gy
Reference Point Dosage Given to Date: 2 Gy
Reference Point Session Dosage Given: 2 Gy
Session Number: 17

## 2022-04-18 ENCOUNTER — Ambulatory Visit
Admission: RE | Admit: 2022-04-18 | Discharge: 2022-04-18 | Disposition: A | Discharge status: Home / Self Care | Payer: Medicare HMO | Source: Ambulatory Visit | Attending: Radiation Oncology | Admitting: Radiation Oncology

## 2022-04-18 ENCOUNTER — Other Ambulatory Visit: Payer: Self-pay

## 2022-04-18 DIAGNOSIS — C50212 Malignant neoplasm of upper-inner quadrant of left female breast: Secondary | ICD-10-CM | POA: Diagnosis not present

## 2022-04-18 DIAGNOSIS — Z170 Estrogen receptor positive status [ER+]: Secondary | ICD-10-CM | POA: Diagnosis not present

## 2022-04-18 LAB — RAD ONC ARIA SESSION SUMMARY
Course Elapsed Days: 23
Plan Fractions Treated to Date: 2
Plan Prescribed Dose Per Fraction: 2 Gy
Plan Total Fractions Prescribed: 4
Plan Total Prescribed Dose: 8 Gy
Reference Point Dosage Given to Date: 4 Gy
Reference Point Session Dosage Given: 2 Gy
Session Number: 18

## 2022-04-19 ENCOUNTER — Ambulatory Visit
Admission: RE | Admit: 2022-04-19 | Discharge: 2022-04-19 | Disposition: A | Discharge status: Home / Self Care | Payer: Medicare HMO | Source: Ambulatory Visit | Attending: Radiation Oncology | Admitting: Radiation Oncology

## 2022-04-19 ENCOUNTER — Other Ambulatory Visit: Payer: Self-pay | Admitting: *Deleted

## 2022-04-19 ENCOUNTER — Other Ambulatory Visit: Payer: Self-pay

## 2022-04-19 DIAGNOSIS — C50212 Malignant neoplasm of upper-inner quadrant of left female breast: Secondary | ICD-10-CM | POA: Diagnosis not present

## 2022-04-19 DIAGNOSIS — Z170 Estrogen receptor positive status [ER+]: Secondary | ICD-10-CM

## 2022-04-19 LAB — RAD ONC ARIA SESSION SUMMARY
Course Elapsed Days: 24
Plan Fractions Treated to Date: 3
Plan Prescribed Dose Per Fraction: 2 Gy
Plan Total Fractions Prescribed: 4
Plan Total Prescribed Dose: 8 Gy
Reference Point Dosage Given to Date: 6 Gy
Reference Point Session Dosage Given: 2 Gy
Session Number: 19

## 2022-04-19 MED ORDER — RADIAPLEXRX EX GEL: Freq: Once | CUTANEOUS | Status: AC

## 2022-04-22 ENCOUNTER — Encounter: Payer: Self-pay | Admitting: *Deleted

## 2022-04-22 ENCOUNTER — Inpatient Hospital Stay: Payer: Medicare HMO | Admitting: Adult Health

## 2022-04-22 ENCOUNTER — Encounter: Payer: Self-pay | Admitting: Radiation Oncology

## 2022-04-22 ENCOUNTER — Other Ambulatory Visit: Payer: Self-pay

## 2022-04-22 ENCOUNTER — Inpatient Hospital Stay: Payer: Medicare HMO

## 2022-04-22 ENCOUNTER — Ambulatory Visit
Admission: RE | Admit: 2022-04-22 | Discharge: 2022-04-22 | Disposition: A | Discharge status: Home / Self Care | Payer: Medicare HMO | Source: Ambulatory Visit | Attending: Radiation Oncology | Admitting: Radiation Oncology

## 2022-04-22 DIAGNOSIS — Z510 Encounter for antineoplastic radiation therapy: Secondary | ICD-10-CM | POA: Diagnosis not present

## 2022-04-22 DIAGNOSIS — C50212 Malignant neoplasm of upper-inner quadrant of left female breast: Secondary | ICD-10-CM | POA: Diagnosis not present

## 2022-04-22 DIAGNOSIS — Z170 Estrogen receptor positive status [ER+]: Secondary | ICD-10-CM | POA: Diagnosis not present

## 2022-04-22 LAB — RAD ONC ARIA SESSION SUMMARY
Course Elapsed Days: 27
Plan Fractions Treated to Date: 4
Plan Prescribed Dose Per Fraction: 2 Gy
Plan Total Fractions Prescribed: 4
Plan Total Prescribed Dose: 8 Gy
Reference Point Dosage Given to Date: 8 Gy
Reference Point Session Dosage Given: 2 Gy
Session Number: 20

## 2022-04-23 ENCOUNTER — Inpatient Hospital Stay: Payer: Medicare HMO

## 2022-04-23 ENCOUNTER — Encounter: Payer: Self-pay | Admitting: Adult Health

## 2022-04-23 ENCOUNTER — Inpatient Hospital Stay: Payer: Medicare HMO | Admitting: Adult Health

## 2022-04-23 ENCOUNTER — Inpatient Hospital Stay: Payer: Medicare HMO | Attending: Hematology and Oncology

## 2022-04-23 ENCOUNTER — Encounter: Payer: Self-pay | Admitting: Hematology and Oncology

## 2022-04-23 VITALS — BP 150/68 | HR 69 | Temp 97.5°F | Resp 18 | Wt 122.2 lb

## 2022-04-23 DIAGNOSIS — C50212 Malignant neoplasm of upper-inner quadrant of left female breast: Secondary | ICD-10-CM | POA: Insufficient documentation

## 2022-04-23 DIAGNOSIS — Z79899 Other long term (current) drug therapy: Secondary | ICD-10-CM | POA: Diagnosis not present

## 2022-04-23 DIAGNOSIS — Z170 Estrogen receptor positive status [ER+]: Secondary | ICD-10-CM | POA: Diagnosis not present

## 2022-04-23 DIAGNOSIS — I82402 Acute embolism and thrombosis of unspecified deep veins of left lower extremity: Secondary | ICD-10-CM | POA: Insufficient documentation

## 2022-04-23 DIAGNOSIS — Z5112 Encounter for antineoplastic immunotherapy: Secondary | ICD-10-CM | POA: Diagnosis present

## 2022-04-23 DIAGNOSIS — I82432 Acute embolism and thrombosis of left popliteal vein: Secondary | ICD-10-CM

## 2022-04-23 DIAGNOSIS — Z7901 Long term (current) use of anticoagulants: Secondary | ICD-10-CM | POA: Diagnosis not present

## 2022-04-23 DIAGNOSIS — Z923 Personal history of irradiation: Secondary | ICD-10-CM | POA: Insufficient documentation

## 2022-04-23 DIAGNOSIS — Z86718 Personal history of other venous thrombosis and embolism: Secondary | ICD-10-CM | POA: Diagnosis not present

## 2022-04-23 DIAGNOSIS — Z95828 Presence of other vascular implants and grafts: Secondary | ICD-10-CM

## 2022-04-23 LAB — CBC WITH DIFFERENTIAL (CANCER CENTER ONLY)
Abs Immature Granulocytes: 0.01 10*3/uL (ref 0.00–0.07)
Basophils Absolute: 0 10*3/uL (ref 0.0–0.1)
Basophils Relative: 1 %
Eosinophils Absolute: 0.1 10*3/uL (ref 0.0–0.5)
Eosinophils Relative: 1 %
HCT: 36.6 % (ref 36.0–46.0)
Hemoglobin: 12.4 g/dL (ref 12.0–15.0)
Immature Granulocytes: 0 %
Lymphocytes Relative: 18 %
Lymphs Abs: 0.8 10*3/uL (ref 0.7–4.0)
MCH: 30.2 pg (ref 26.0–34.0)
MCHC: 33.9 g/dL (ref 30.0–36.0)
MCV: 89.3 fL (ref 80.0–100.0)
Monocytes Absolute: 0.6 10*3/uL (ref 0.1–1.0)
Monocytes Relative: 12 %
Neutro Abs: 3 10*3/uL (ref 1.7–7.7)
Neutrophils Relative %: 68 %
Platelet Count: 166 10*3/uL (ref 150–400)
RBC: 4.1 MIL/uL (ref 3.87–5.11)
RDW: 12.4 % (ref 11.5–15.5)
WBC Count: 4.5 10*3/uL (ref 4.0–10.5)
nRBC: 0 % (ref 0.0–0.2)

## 2022-04-23 LAB — CMP (CANCER CENTER ONLY)
ALT: 13 U/L (ref 0–44)
AST: 18 U/L (ref 15–41)
Albumin: 4.2 g/dL (ref 3.5–5.0)
Alkaline Phosphatase: 88 U/L (ref 38–126)
Anion gap: 6 (ref 5–15)
BUN: 18 mg/dL (ref 8–23)
CO2: 27 mmol/L (ref 22–32)
Calcium: 9.8 mg/dL (ref 8.9–10.3)
Chloride: 105 mmol/L (ref 98–111)
Creatinine: 0.57 mg/dL (ref 0.44–1.00)
GFR, Estimated: >60 mL/min (ref >60)
Glucose, Bld: 85 mg/dL (ref 70–99)
Potassium: 4.5 mmol/L (ref 3.5–5.1)
Sodium: 138 mmol/L (ref 135–145)
Total Bilirubin: 0.4 mg/dL (ref 0.3–1.2)
Total Protein: 6.7 g/dL (ref 6.5–8.1)

## 2022-04-23 MED ORDER — SODIUM CHLORIDE 0.9% FLUSH
10.0000 mL | Freq: Once | INTRAVENOUS | Status: AC
  Administered 2022-04-23: 10 mL

## 2022-04-23 MED ORDER — ACETAMINOPHEN 325 MG PO TABS
650.0000 mg | ORAL_TABLET | Freq: Once | ORAL | Status: AC
  Administered 2022-04-23: 650 mg via ORAL
  Filled 2022-04-23: qty 2

## 2022-04-23 MED ORDER — SODIUM CHLORIDE 0.9 % IV SOLN: Freq: Once | INTRAVENOUS | Status: AC

## 2022-04-23 MED ORDER — LORATADINE 10 MG PO TABS
10.0000 mg | ORAL_TABLET | Freq: Every day | ORAL | Status: DC
  Administered 2022-04-23: 10 mg via ORAL
  Filled 2022-04-23: qty 1

## 2022-04-23 MED ORDER — TRASTUZUMAB-ANNS CHEMO 150 MG IV SOLR
6.0000 mg/kg | Freq: Once | INTRAVENOUS | Status: AC
  Administered 2022-04-23: 300 mg via INTRAVENOUS
  Filled 2022-04-23: qty 14.29

## 2022-04-23 NOTE — Progress Notes (Signed)
                                                                                                                                                             Patient Name: Patricia Marshall MRN: [id number] DOB: November 06, 1945 Referring Physician: Dorothyann Peng (Profile Not Attached) Date of Service: 04/22/2022 [facility], [place]                                                        End Of Treatment Note  Diagnoses: C50.212-Malignant neoplasm of upper-inner quadrant of left female breast  Cancer Staging:  Stage IIA, pT2N0M0, grade 2, ER positive, HER2 amplified invasive ductal carcinoma of the left breast.   Intent: Curative  Radiation Treatment Dates: 03/26/2022 through 04/22/2022 Site Technique Total Dose (Gy) Dose per Fx (Gy) Completed Fx Beam Energies  Breast, Left: Breast_L 3D 42.56/42.56 2.66 16/16 6X  Breast, Left: Breast_L_Bst 3D 8/8 2 4/4 6X   Narrative: The patient tolerated radiation therapy relatively well. She developed fatigue and anticipated skin changes in the treatment field.   Plan: The patient will receive a call in about one month from the radiation oncology department. She will continue follow up with Dr. Chryl Heck as well.   ________________________________________________    Carola Rhine, [facility]

## 2022-04-23 NOTE — Patient Instructions (Signed)
[facility]  Discharge Instructions: Thank you for choosing [facility] to provide your oncology and hematology care.   If you have a lab appointment with the Cancer Center, please go directly to the Cancer Center and check in at the registration area.   Wear comfortable clothing and clothing appropriate for easy access to any Portacath or PICC line.   We strive to give you quality time with your provider. You may need to reschedule your appointment if you arrive late (15 or more minutes).  Arriving late affects you and other patients whose appointments are after yours.  Also, if you miss three or more appointments without notifying the office, you may be dismissed from the clinic at the provider's discretion.      For prescription refill requests, have your pharmacy contact our office and allow 72 hours for refills to be completed.    Today you received the following chemotherapy and/or immunotherapy agents herceptin      To help prevent nausea and vomiting after your treatment, we encourage you to take your nausea medication as directed.  BELOW ARE SYMPTOMS THAT SHOULD BE REPORTED IMMEDIATELY: *FEVER GREATER THAN 100.4 F (38 C) OR HIGHER *CHILLS OR SWEATING *NAUSEA AND VOMITING THAT IS NOT CONTROLLED WITH YOUR NAUSEA MEDICATION *UNUSUAL SHORTNESS OF BREATH *UNUSUAL BRUISING OR BLEEDING *URINARY PROBLEMS (pain or burning when urinating, or frequent urination) *BOWEL PROBLEMS (unusual diarrhea, constipation, pain near the anus) TENDERNESS IN MOUTH AND THROAT WITH OR WITHOUT PRESENCE OF ULCERS (sore throat, sores in mouth, or a toothache) UNUSUAL RASH, SWELLING OR PAIN  UNUSUAL VAGINAL DISCHARGE OR ITCHING   Items with * indicate a potential emergency and should be followed up as soon as possible or go to the Emergency Department if any problems should occur.  Please show the CHEMOTHERAPY ALERT CARD or IMMUNOTHERAPY ALERT CARD at  check-in to the Emergency Department and triage nurse.  Should you have questions after your visit or need to cancel or reschedule your appointment, please contact [facility]  Dept: [phone]  and follow the prompts.  Office hours are 8:00 a.m. to 4:30 p.m. Monday - Friday. Please note that voicemails left after 4:00 p.m. may not be returned until the following business day.  We are closed weekends and major holidays. You have access to a nurse at all times for urgent questions. Please call the main number to the clinic Dept: [phone] and follow the prompts.   For any non-urgent questions, you may also contact your provider using MyChart. We now offer e-Visits for anyone 18 and older to request care online for non-urgent symptoms. For details visit [email or web address].   Also download the MyChart app! Go to the app store, search "MyChart", open the app, select [facility], and log in with your MyChart username and password.   

## 2022-04-23 NOTE — Patient Instructions (Signed)
Anastrozole Tablets What is this medication? ANASTROZOLE (an AS troe zole) treats breast cancer. It works by decreasing the amount of estrogen hormone your body makes, which slows or stops breast cancer cells from spreading or growing. This medicine may be used for other purposes; ask your health care provider or pharmacist if you have questions. COMMON BRAND NAME(S): Arimidex What should I tell my care team before I take this medication? They need to know if you have any of these conditions: Bone problems Heart disease High cholesterol An unusual or allergic reaction to anastrozole, other medications, foods, dyes, or preservatives Pregnant or trying to get pregnant Breast-feeding How should I use this medication? Take this medication by mouth with a glass of water. Follow the directions on the prescription label. You can take it with or without food. If it upsets your stomach, take it with food. Take your medication at regular intervals. Do not take it more often than directed. Do not stop taking except on your care team's advice. Talk to your care team about the use of this medication in children. Special care may be needed. Overdosage: If you think you have taken too much of this medicine contact a poison control center or emergency room at once. NOTE: This medicine is only for you. Do not share this medicine with others. What if I miss a dose? If you miss a dose, take it as soon as you can. If it is almost time for your next dose, take only that dose. Do not take double or extra doses. What may interact with this medication? Estrogen and progestin hormones Tamoxifen This list may not describe all possible interactions. Give your health care provider a list of all the medicines, herbs, non-prescription drugs, or dietary supplements you use. Also tell them if you smoke, drink alcohol, or use illegal drugs. Some items may interact with your medicine. What should I watch for while using this  medication? Visit your care team for regular checks on your progress. Let your care team know about any unusual vaginal bleeding. Using this medication for a long time may weaken your bones. The risk of bone fractures may be increased. Talk to your care team about your bone health. You should make sure that you get enough calcium and vitamin D while you are taking this medication. Discuss the foods you eat and the vitamins you take with your care team. Talk to your care team if you may be pregnant. Serious birth defects can occur if you take this medication during pregnancy and for 3 weeks after the last dose. You will need a negative pregnancy test before starting this medication. Estrogen and progestin hormones may not work as well while you are taking this medication. Contraception is recommended while taking this medication and for 3 weeks after the last dose. Your care team can help you find the option that works for you. Do not breastfeed while taking this medication and for 2 weeks after the last dose. This medication may cause infertility. Talk with your care team if you are concerned about your fertility. What side effects may I notice from receiving this medication? Side effects that you should report to your care team as soon as possible: Allergic reactions--skin rash, itching, hives, swelling of the face, lips, tongue, or throat Side effects that usually do not require medical attention (report to your care team if they continue or are bothersome): Bone, joint, or muscle pain Headache Hot flashes Nausea Sore throat Swelling of the ankles,  hands, or feet Unusual weakness or fatigue This list may not describe all possible side effects. Call your doctor for medical advice about side effects. You may report side effects to FDA at 1-800-FDA-1088. Where should I keep my medication? Keep out of the reach of children and pets. Store at room temperature between 20 and 25 degrees C (68 and 77  degrees F). Get rid of any unused medication after the expiration date. To get rid of medications that are no longer wanted or have expired: Take the medication to a medication take-back program. Check with your pharmacy or law enforcement to find a location. If you cannot return the medication, check the label or package insert to see if the medication should be thrown out in the garbage or flushed down the toilet. If you are not sure, ask your care team. If it is safe to put it in the trash, empty the medication out of the container. Mix the medication with cat litter, dirt, coffee grounds, or other unwanted substance. Seal the mixture in a bag or container. Put it in the trash. NOTE: This sheet is a summary. It may not cover all possible information. If you have questions about this medicine, talk to your doctor, pharmacist, or health care provider.  2023 Elsevier/Gold Standard (2021-07-20 00:00:00)

## 2022-04-23 NOTE — Progress Notes (Signed)
[facility] Follow up:    Patricia Peng, NP [employer] [place] [address]   DIAGNOSIS:  Cancer Staging  Malignant neoplasm of upper-inner quadrant of left breast in female, estrogen receptor positive ([place]) Staging form: Breast, AJCC 8th Edition - Pathologic: Stage IIA (pT2, pN0, cM0, G3, ER+, PR-, HER2+) - Signed by Benay Pike, MD on 11/26/2021 Histologic grading system: 3 grade system   SUMMARY OF ONCOLOGIC HISTORY: Oncology History  Malignant neoplasm of upper-inner quadrant of left breast in female, estrogen receptor positive ([place])  09/20/2021 Mammogram   Screening mammogram showed extremely dense breasts and a possible mass in the left breast warranting further evaluation.  Breast density category is D Diagnostic mammogram of the breast showed indeterminate mass in the 11:00 location of the left breast.  Indeterminate mass in the lower anterior left axilla.   10/04/2021 Pathology Results   Pathology showed grade 2 invasive ductal carcinoma, left axillary soft tissue needle core biopsy showed benign breast tissue with patchy changes prognostics from the breast tumor showed ER 90% positive strong staining PR 0%, negative, Ki-67 of 30% and HER2 positive for IHC 3+   10/19/2021 Initial Diagnosis   Malignant neoplasm of upper-inner quadrant of left breast in female, estrogen receptor positive ([place])   10/23/2021 Cancer Staging   Staging form: Breast, AJCC 8th Edition - Pathologic: Stage IIA (pT2, pN0, cM0, G3, ER+, PR-, HER2+) - Signed by Benay Pike, MD on 11/26/2021 Histologic grading system: 3 grade system   11/10/2021 Genetic Testing   Negative hereditary cancer genetic testing: no pathogenic variants detected in [facility] CancerNext-Expanded +RNA Panel.  Report date is November 10, 2021.   The CancerNext-Expanded gene panel offered by [facility] and includes sequencing, rearrangement, and RNA analysis for the following 77 genes: AIP, ALK, APC,  ATM, AXIN2, BAP1, BARD1, BLM, BMPR1A, BRCA1, BRCA2, BRIP1, CDC73, CDH1, CDK4, CDKN1B, CDKN2A, CHEK2, CTNNA1, DICER1, FANCC, FH, FLCN, GALNT12, KIF1B, LZTR1, MAX, MEN1, MET, MLH1, MSH2, MSH3, MSH6, MUTYH, NBN, NF1, NF2, NTHL1, PALB2, PHOX2B, PMS2, POT1, PRKAR1A, PTCH1, PTEN, RAD51C, RAD51D, RB1, RECQL, RET, SDHA, SDHAF2, SDHB, SDHC, SDHD, SMAD4, SMARCA4, SMARCB1, SMARCE1, STK11, SUFU, TMEM127, TP53, TSC1, TSC2, VHL and XRCC2 (sequencing and deletion/duplication); EGFR, EGLN1, HOXB13, KIT, MITF, PDGFRA, POLD1, and POLE (sequencing only); EPCAM and GREM1 (deletion/duplication only).    11/15/2021 Surgery   Left breast lumpectomy: 2.4 cm grade 3 invasive ductal carcinoma, margins -2 sentinel lymph nodes negative.   12/10/2021 -  Chemotherapy   Patient is on Treatment Plan : BREAST Paclitaxel + Trastuzumab q7d / Trastuzumab q21d     03/26/2022 - 04/22/2022 Radiation Therapy   03/26/2022 through 04/22/2022 Site Technique Total Dose (Gy) Dose per Fx (Gy) Completed Fx Beam Energies  Breast, Left: Breast_L 3D 42.56/42.56 2.66 16/16 6X  Breast, Left: Breast_L_Bst 3D 8/8 2 4/4 6X      CURRENT THERAPY: Herceptin  INTERVAL HISTORY: Patricia Marshall 77 y.o. female returns for f/u and evaluation prior to receiving Herceptin.  Her most recent echocardiogram occurred on February 01, 2022 demonstrating a normal left ventricular ejection fraction of 60 to 65% and normal global longitudinal strain.  Her next echocardiogram is scheduled on April 29, 2022.  She completed adjuvant radiation yesterday.  Wants me to look at her skin where she received the radiation to give my thoughts and opinions on it.  She has history of left lower extremity DVT that was diagnosed in September 2023 and she has been taking Eliquis twice daily since that  time.  He and her husband are concerned that she is still having swelling in her left leg after having been on Eliquis for about 5 months and want to know if they can expect another  Doppler to look at the status of the blood clot.  She also has a history of Raynaud's and notes that her hands have been more erythematous in the evenings.  She wants to know if that has anything to do with the treatment she is receiving.   Patient Active Problem List   Diagnosis Date Noted   Acute deep vein thrombosis (DVT) of left lower extremity ([place]) 04/23/2022   Port-A-Cath in place 02/04/2022   Genetic testing 12/07/2021   Malignant neoplasm of upper-inner quadrant of left breast in female, estrogen receptor positive ([place]) 10/19/2021   Piriformis syndrome of left side 07/12/2015   Degenerative arthritis of left knee 07/12/2015   BEE STING REACTION, LOCAL 10/24/2006    is allergic to other, ampicillin, and anesthesia [id number] [propofol].  MEDICAL HISTORY: Past Medical History:  Diagnosis Date   Arthritis    Breast cancer ([place])    Chicken pox    High cholesterol    PONV (postoperative nausea and vomiting)    Vitamin D deficiency     SURGICAL HISTORY: Past Surgical History:  Procedure Laterality Date   [place]   BREAST LUMPECTOMY WITH AXILLARY LYMPH NODE BIOPSY Left 11/15/2021   Procedure: LEFT BREAST LUMPECTOMY WITH AXILLARY SENTINEL LYMPH NODE BIOPSY;  Surgeon: Rolm Bookbinder, MD;  Location: [facility];  Service: General;  Laterality: Left;   PORTACATH PLACEMENT Right 11/15/2021   Procedure: INSERTION PORT-A-CATH;  Surgeon: Rolm Bookbinder, MD;  Location: [facility];  Service: General;  Laterality: Right;   TONSILLECTOMY AND ADENOIDECTOMY  1953    SOCIAL HISTORY: Social History   Socioeconomic History   Marital status: Married    Spouse name: Not on file   Number of children: Not on file   Years of education: Not on file   Highest education level: Not on file  Occupational History   Not on file  Tobacco Use   Smoking status: Never   Smokeless tobacco: Never  Vaping Use   Vaping Use: Never  used  Substance and Sexual Activity   Alcohol use: Yes    Alcohol/week: 1.0 standard drink of alcohol    Types: 1 Glasses of wine per week   Drug use: Never   Sexual activity: Not on file  Other Topics Concern   Not on file  Social History Narrative   Married    Retired    Investment banker, operational of Radio broadcast assistant Strain: [place]  (09/03/2021)   Overall Financial Resource Strain (CARDIA)    Difficulty of Paying Living Expenses: Not hard at all  Food Insecurity: No Food Insecurity (09/03/2021)   Hunger Vital Sign    Worried About Running Out of Food in the Last Year: Never true    [place] in the Last Year: Never true  Transportation Needs: No Transportation Needs (09/03/2021)   PRAPARE - Hydrologist (Medical): No    Lack of Transportation (Non-Medical): No  Physical Activity: Sufficiently Active (09/03/2021)   Exercise Vital Sign    Days of Exercise per Week: 4 days    Minutes of Exercise per Session: 120 min  Stress: No Stress Concern Present (09/03/2021)   [employer]  of [place]    Feeling of Stress : Not at all  Social Connections: Socially Integrated (09/03/2021)   Social Connection and Isolation Panel [NHANES]    Frequency of Communication with Friends and Family: More than three times a week    Frequency of Social Gatherings with Friends and Family: More than three times a week    Attends Religious Services: More than 4 times per year    Active Member of [employer] or Organizations: Yes    Attends Music therapist: More than 4 times per year    Marital Status: Married  Human resources officer Violence: Not At Risk (09/03/2021)   Humiliation, Afraid, Rape, and Kick questionnaire    Fear of Current or Ex-Partner: No    Emotionally Abused: No    Physically Abused: No    Sexually Abused: No    FAMILY HISTORY: Family History  Problem Relation Age of Onset   Stomach cancer  Mother 4   Heart attack Father 1   Cancer Sister        dx mid 57s; unknown primary w/ mets; treated like ovarian cancer   Heart attack Brother 67   Heart attack Brother 3    Review of Systems  Constitutional:  Positive for fatigue. Negative for appetite change, chills, fever and unexpected weight change.  HENT:   Negative for hearing loss, lump/mass and trouble swallowing.   Eyes:  Negative for eye problems and icterus.  Respiratory:  Negative for chest tightness, cough and shortness of breath.   Cardiovascular:  Negative for chest pain, leg swelling and palpitations.  Gastrointestinal:  Negative for abdominal distention, abdominal pain, constipation, diarrhea, nausea and vomiting.  Endocrine: Negative for hot flashes.  Genitourinary:  Negative for difficulty urinating.   Musculoskeletal:  Negative for arthralgias.  Skin:  Negative for itching and rash.  Neurological:  Negative for dizziness, extremity weakness, headaches and numbness.  Hematological:  Negative for adenopathy. Does not bruise/bleed easily.  Psychiatric/Behavioral:  Negative for depression. The patient is not nervous/anxious.       PHYSICAL EXAMINATION  ECOG PERFORMANCE STATUS: 1 - Symptomatic but completely ambulatory  Vitals:   04/23/22 1146  BP: (!) 150/68  Pulse: 69  Resp: 18  Temp: (!) 97.5 F (36.4 C)  SpO2: 100%    Physical Exam Constitutional:      General: She is not in acute distress.    Appearance: Normal appearance. She is not toxic-appearing.  HENT:     Head: Normocephalic and atraumatic.  Eyes:     General: No scleral icterus. Cardiovascular:     Rate and Rhythm: Normal rate and regular rhythm.     Pulses: Normal pulses.     Heart sounds: Normal heart sounds.  Pulmonary:     Effort: Pulmonary effort is normal.     Breath sounds: Normal breath sounds.  Chest:     Comments: Left breast is status postlumpectomy and radiation.  There is erythemata scattered over the left breast and  underneath the breast from the radiation.  There is no desquamation present. Abdominal:     General: Abdomen is flat. Bowel sounds are normal. There is no distension.     Palpations: Abdomen is soft.     Tenderness: There is no abdominal tenderness.  Musculoskeletal:        General: No swelling.     Cervical back: Neck supple.  Lymphadenopathy:     Cervical: No cervical adenopathy.  Skin:    General:  Skin is warm and dry.     Findings: No rash.  Neurological:     General: No focal deficit present.     Mental Status: She is alert.  Psychiatric:        Mood and Affect: Mood normal.        Behavior: Behavior normal.     LABORATORY DATA:  CBC    Component Value Date/Time   WBC 4.5 04/23/2022 1124   WBC 8.7 11/19/2021 1645   RBC 4.10 04/23/2022 1124   HGB 12.4 04/23/2022 1124   HCT 36.6 04/23/2022 1124   PLT 166 04/23/2022 1124   MCV 89.3 04/23/2022 1124   MCH 30.2 04/23/2022 1124   MCHC 33.9 04/23/2022 1124   RDW 12.4 04/23/2022 1124   LYMPHSABS 0.8 04/23/2022 1124   MONOABS 0.6 04/23/2022 1124   EOSABS 0.1 04/23/2022 1124   BASOSABS 0.0 04/23/2022 1124    CMP     Component Value Date/Time   NA 138 04/23/2022 1124   K 4.5 04/23/2022 1124   CL 105 04/23/2022 1124   CO2 27 04/23/2022 1124   GLUCOSE 85 04/23/2022 1124   BUN 18 04/23/2022 1124   CREATININE 0.57 04/23/2022 1124   CREATININE 0.76 11/03/2019 0921   CALCIUM 9.8 04/23/2022 1124   PROT 6.7 04/23/2022 1124   ALBUMIN 4.2 04/23/2022 1124   AST 18 04/23/2022 1124   ALT 13 04/23/2022 1124   ALKPHOS 88 04/23/2022 1124   BILITOT 0.4 04/23/2022 1124   GFRNONAA >60 04/23/2022 1124   GFRNONAA 78 11/03/2019 0921   GFRAA 91 11/03/2019 0921       ASSESSMENT and THERAPY PLAN:   Malignant neoplasm of upper-inner quadrant of left breast in female, estrogen receptor positive (HCC) Patricia Marshall is a 77 year old woman with history of stage IIa ER positive HER2 positive left-sided invasive ductal carcinoma diagnosed in  July 2023 status post lumpectomy followed by adjuvant chemotherapy, maintenance Herceptin, adjuvant radiation, with antiestrogen therapy planned to begin in March.  Patricia Marshall continues on Herceptin alone at this time.  She tolerated radiation well.  I reassured her that her skin looks as expected after radiation and that with time it will heal.  She has no clinical signs of breast cancer recurrence.  Her labs are stable and she will proceed with Herceptin alone today.  We discussed antiestrogen therapy and I gave her information about anastrozole today.  We reviewed the role of antiestrogen therapy and her cancer treatment in detail.  Patricia Marshall also has a left leg DVT on therapy with Eliquis twice daily considering she has been on the Eliquis for 5 months we will repeat a Doppler since she is still having swelling in the left leg.  Based on those results we can also discuss whether she should see a vascular surgeon.  He is scheduled for the Doppler next week when she undergoes her echocardiogram.  Patricia Marshall will return in 3 weeks for labs, follow-up, and her next treatment.   All questions were answered. The patient knows to call the clinic with any problems, questions or concerns. We can certainly see the patient much sooner if necessary.  Total encounter time:45 minutes*in face-to-face visit time, chart review, lab review, care coordination, order entry, and documentation of the encounter time.    Wilber Bihari, NP 04/23/22 1:31 PM [facility] [place],  [address] Tel. [phone]    Fax. [phone]  *Total Encounter Time as defined by the Centers for Medicare  and Medicaid Services includes, in addition to the face-to-face time of a patient visit (documented in the note above) non-face-to-face time: obtaining and reviewing outside history, ordering and reviewing medications, tests or procedures, care coordination (communications with  other health care professionals or caregivers) and documentation in the medical record.

## 2022-04-23 NOTE — Assessment & Plan Note (Addendum)
Patricia Marshall is a 77 year old woman with history of stage IIa ER positive HER2 positive left-sided invasive ductal carcinoma diagnosed in July 2023 status post lumpectomy followed by adjuvant chemotherapy, maintenance Herceptin, adjuvant radiation, with antiestrogen therapy planned to begin in March.  Patricia Marshall continues on Herceptin alone at this time.  She tolerated radiation well.  I reassured her that her skin looks as expected after radiation and that with time it will heal.  She has no clinical signs of breast cancer recurrence.  Her labs are stable and she will proceed with Herceptin alone today.  We discussed antiestrogen therapy and I gave her information about anastrozole today.  We reviewed the role of antiestrogen therapy and her cancer treatment in detail.  Patricia Marshall also has a left leg DVT on therapy with Eliquis twice daily considering she has been on the Eliquis for 5 months we will repeat a Doppler since she is still having swelling in the left leg.  Based on those results we can also discuss whether she should see a vascular surgeon.  He is scheduled for the Doppler next week when she undergoes her echocardiogram.  Patricia Marshall will return in 3 weeks for labs, follow-up, and her next treatment.

## 2022-04-23 NOTE — Progress Notes (Signed)
Rounded Kanjinti to '300mg'$  to use full vials in order to reduce waste.  Laray Anger, PharmD PGY-2 Pharmacy Resident Hematology/Oncology [phone]  04/23/2022 1:29 PM

## 2022-04-29 ENCOUNTER — Ambulatory Visit (HOSPITAL_BASED_OUTPATIENT_CLINIC_OR_DEPARTMENT_OTHER)
Admission: RE | Admit: 2022-04-29 | Discharge: 2022-04-29 | Disposition: A | Discharge status: Home / Self Care | Payer: Medicare HMO | Source: Ambulatory Visit | Attending: Adult Health | Admitting: Adult Health

## 2022-04-29 ENCOUNTER — Ambulatory Visit (HOSPITAL_COMMUNITY)
Admission: RE | Admit: 2022-04-29 | Discharge: 2022-04-29 | Disposition: A | Discharge status: Home / Self Care | Payer: Medicare HMO | Source: Ambulatory Visit | Attending: Hematology and Oncology | Admitting: Hematology and Oncology

## 2022-04-29 DIAGNOSIS — C50212 Malignant neoplasm of upper-inner quadrant of left female breast: Secondary | ICD-10-CM | POA: Insufficient documentation

## 2022-04-29 DIAGNOSIS — Z170 Estrogen receptor positive status [ER+]: Secondary | ICD-10-CM | POA: Insufficient documentation

## 2022-04-29 DIAGNOSIS — I82432 Acute embolism and thrombosis of left popliteal vein: Secondary | ICD-10-CM | POA: Insufficient documentation

## 2022-04-29 DIAGNOSIS — E785 Hyperlipidemia, unspecified: Secondary | ICD-10-CM | POA: Diagnosis not present

## 2022-04-29 DIAGNOSIS — Z0189 Encounter for other specified special examinations: Secondary | ICD-10-CM | POA: Diagnosis not present

## 2022-04-29 DIAGNOSIS — I081 Rheumatic disorders of both mitral and tricuspid valves: Secondary | ICD-10-CM | POA: Insufficient documentation

## 2022-04-29 LAB — ECHOCARDIOGRAM COMPLETE
Area-P 1/2: 3.36 cm2
S' Lateral: 2.3 cm

## 2022-04-29 NOTE — Progress Notes (Signed)
Left lower extremity venous duplex has been completed. Preliminary results can be found in CV Proc through chart review.  Results were given to Shelda Pal at [facility] NP office.  04/29/22 9:52 AM Patricia Marshall RVT

## 2022-04-29 NOTE — Progress Notes (Signed)
Received report from vascular US. Pt negative for DVTs. Relayed message to Wilber Bihari, NP.

## 2022-05-09 ENCOUNTER — Telehealth: Payer: Self-pay | Admitting: *Deleted

## 2022-05-09 NOTE — Telephone Encounter (Signed)
Spoke with the patient to let her know that it is ok for her to continue to use the radiaplex cream until her tube is gone.  She can then use any moisturizer she likes.  She was informed that we would give her a call in a week or so to check in to see how she is doing since completion of radiation.  She requested a copy of her treatment summary and I let her know that I would have that waiting for her Monday when she comes in for her infusion.  She verbalized understanding and was appreciative.  Patricia Marshall. Leonie Green, BSN

## 2022-05-10 ENCOUNTER — Other Ambulatory Visit: Payer: Self-pay

## 2022-05-10 DIAGNOSIS — C50212 Malignant neoplasm of upper-inner quadrant of left female breast: Secondary | ICD-10-CM

## 2022-05-13 ENCOUNTER — Inpatient Hospital Stay: Payer: Medicare HMO | Admitting: Hematology and Oncology

## 2022-05-13 ENCOUNTER — Inpatient Hospital Stay: Payer: Medicare HMO

## 2022-05-13 VITALS — BP 167/72 | HR 72 | Temp 97.7°F | Resp 16 | Ht 65.0 in | Wt 123.6 lb

## 2022-05-13 VITALS — BP 138/66 | HR 74 | Resp 16

## 2022-05-13 DIAGNOSIS — Z923 Personal history of irradiation: Secondary | ICD-10-CM | POA: Diagnosis not present

## 2022-05-13 DIAGNOSIS — C50212 Malignant neoplasm of upper-inner quadrant of left female breast: Secondary | ICD-10-CM

## 2022-05-13 DIAGNOSIS — Z170 Estrogen receptor positive status [ER+]: Secondary | ICD-10-CM

## 2022-05-13 DIAGNOSIS — Z7901 Long term (current) use of anticoagulants: Secondary | ICD-10-CM | POA: Diagnosis not present

## 2022-05-13 DIAGNOSIS — Z86718 Personal history of other venous thrombosis and embolism: Secondary | ICD-10-CM | POA: Diagnosis not present

## 2022-05-13 DIAGNOSIS — Z95828 Presence of other vascular implants and grafts: Secondary | ICD-10-CM

## 2022-05-13 DIAGNOSIS — Z5112 Encounter for antineoplastic immunotherapy: Secondary | ICD-10-CM | POA: Diagnosis not present

## 2022-05-13 DIAGNOSIS — Z79899 Other long term (current) drug therapy: Secondary | ICD-10-CM | POA: Diagnosis not present

## 2022-05-13 LAB — CMP (CANCER CENTER ONLY)
ALT: 12 U/L (ref 0–44)
AST: 17 U/L (ref 15–41)
Albumin: 4.2 g/dL (ref 3.5–5.0)
Alkaline Phosphatase: 84 U/L (ref 38–126)
Anion gap: 6 (ref 5–15)
BUN: 19 mg/dL (ref 8–23)
CO2: 28 mmol/L (ref 22–32)
Calcium: 9.1 mg/dL (ref 8.9–10.3)
Chloride: 104 mmol/L (ref 98–111)
Creatinine: 0.58 mg/dL (ref 0.44–1.00)
GFR, Estimated: >60 mL/min (ref >60)
Glucose, Bld: 87 mg/dL (ref 70–99)
Potassium: 4.3 mmol/L (ref 3.5–5.1)
Sodium: 138 mmol/L (ref 135–145)
Total Bilirubin: 0.5 mg/dL (ref 0.3–1.2)
Total Protein: 6.9 g/dL (ref 6.5–8.1)

## 2022-05-13 LAB — CBC WITH DIFFERENTIAL (CANCER CENTER ONLY)
Abs Immature Granulocytes: 0 10*3/uL (ref 0.00–0.07)
Basophils Absolute: 0 10*3/uL (ref 0.0–0.1)
Basophils Relative: 1 %
Eosinophils Absolute: 0 10*3/uL (ref 0.0–0.5)
Eosinophils Relative: 1 %
HCT: 36.5 % (ref 36.0–46.0)
Hemoglobin: 12.5 g/dL (ref 12.0–15.0)
Immature Granulocytes: 0 %
Lymphocytes Relative: 22 %
Lymphs Abs: 1 10*3/uL (ref 0.7–4.0)
MCH: 30.1 pg (ref 26.0–34.0)
MCHC: 34.2 g/dL (ref 30.0–36.0)
MCV: 88 fL (ref 80.0–100.0)
Monocytes Absolute: 0.5 10*3/uL (ref 0.1–1.0)
Monocytes Relative: 12 %
Neutro Abs: 2.9 10*3/uL (ref 1.7–7.7)
Neutrophils Relative %: 64 %
Platelet Count: 160 10*3/uL (ref 150–400)
RBC: 4.15 MIL/uL (ref 3.87–5.11)
RDW: 12.2 % (ref 11.5–15.5)
WBC Count: 4.5 10*3/uL (ref 4.0–10.5)
nRBC: 0 % (ref 0.0–0.2)

## 2022-05-13 MED ORDER — ACETAMINOPHEN 325 MG PO TABS
650.0000 mg | ORAL_TABLET | Freq: Once | ORAL | Status: AC
  Administered 2022-05-13: 650 mg via ORAL
  Filled 2022-05-13: qty 2

## 2022-05-13 MED ORDER — ANASTROZOLE 1 MG PO TABS: 1.0000 mg | ORAL_TABLET | Freq: Every day | ORAL | 3 refills | Status: DC

## 2022-05-13 MED ORDER — SODIUM CHLORIDE 0.9 % IV SOLN: Freq: Once | INTRAVENOUS | Status: AC

## 2022-05-13 MED ORDER — SODIUM CHLORIDE 0.9% FLUSH
10.0000 mL | Freq: Once | INTRAVENOUS | Status: AC
  Administered 2022-05-13: 10 mL

## 2022-05-13 MED ORDER — HEPARIN SOD (PORK) LOCK FLUSH 100 UNIT/ML IV SOLN
500.0000 [IU] | Freq: Once | INTRAVENOUS | Status: AC | PRN
  Administered 2022-05-13: 500 [IU]

## 2022-05-13 MED ORDER — TRASTUZUMAB-ANNS CHEMO 150 MG IV SOLR
6.0000 mg/kg | Freq: Once | INTRAVENOUS | Status: AC
  Administered 2022-05-13: 300 mg via INTRAVENOUS
  Filled 2022-05-13: qty 14.29

## 2022-05-13 MED ORDER — SODIUM CHLORIDE 0.9% FLUSH
10.0000 mL | INTRAVENOUS | Status: DC | PRN
  Administered 2022-05-13: 10 mL

## 2022-05-13 MED ORDER — LORATADINE 10 MG PO TABS
10.0000 mg | ORAL_TABLET | Freq: Every day | ORAL | Status: DC
  Administered 2022-05-13: 10 mg via ORAL
  Filled 2022-05-13: qty 1

## 2022-05-13 NOTE — Progress Notes (Signed)
[place] PROGRESS NOTE  Patient Care Team: Dorothyann Peng, NP as PCP - General (Family Medicine) Rolm Bookbinder, MD as Consulting Physician (General Surgery) Benay Pike, MD as Consulting Physician (Hematology and Oncology) Kyung Rudd, MD as Consulting Physician (Radiation Oncology) Rockwell Germany, RN as Oncology Nurse Navigator Mauro Kaufmann, RN as Oncology Nurse Navigator  CHIEF COMPLAINTS/PURPOSE OF CONSULTATION:  Left breast cancer  SUMMARY OF ONCOLOGIC HISTORY: Oncology History  Malignant neoplasm of upper-inner quadrant of left breast in female, estrogen receptor positive ([place])  09/20/2021 Mammogram   Screening mammogram showed extremely dense breasts and a possible mass in the left breast warranting further evaluation.  Breast density category is D Diagnostic mammogram of the breast showed indeterminate mass in the 11:00 location of the left breast.  Indeterminate mass in the lower anterior left axilla.   10/04/2021 Pathology Results   Pathology showed grade 2 invasive ductal carcinoma, left axillary soft tissue needle core biopsy showed benign breast tissue with patchy changes prognostics from the breast tumor showed ER 90% positive strong staining PR 0%, negative, Ki-67 of 30% and HER2 positive for IHC 3+   10/19/2021 Initial Diagnosis   Malignant neoplasm of upper-inner quadrant of left breast in female, estrogen receptor positive ([place])   10/23/2021 Cancer Staging   Staging form: Breast, AJCC 8th Edition - Pathologic: Stage IIA (pT2, pN0, cM0, G3, ER+, PR-, HER2+) - Signed by Benay Pike, MD on 11/26/2021 Histologic grading system: 3 grade system   11/10/2021 Genetic Testing   Negative hereditary cancer genetic testing: no pathogenic variants detected in [facility] CancerNext-Expanded +RNA Panel.  Report date is November 10, 2021.   The CancerNext-Expanded gene panel offered by [facility] and includes sequencing, rearrangement, and RNA analysis for  the following 77 genes: AIP, ALK, APC, ATM, AXIN2, BAP1, BARD1, BLM, BMPR1A, BRCA1, BRCA2, BRIP1, CDC73, CDH1, CDK4, CDKN1B, CDKN2A, CHEK2, CTNNA1, DICER1, FANCC, FH, FLCN, GALNT12, KIF1B, LZTR1, MAX, MEN1, MET, MLH1, MSH2, MSH3, MSH6, MUTYH, NBN, NF1, NF2, NTHL1, PALB2, PHOX2B, PMS2, POT1, PRKAR1A, PTCH1, PTEN, RAD51C, RAD51D, RB1, RECQL, RET, SDHA, SDHAF2, SDHB, SDHC, SDHD, SMAD4, SMARCA4, SMARCB1, SMARCE1, STK11, SUFU, TMEM127, TP53, TSC1, TSC2, VHL and XRCC2 (sequencing and deletion/duplication); EGFR, EGLN1, HOXB13, KIT, MITF, PDGFRA, POLD1, and POLE (sequencing only); EPCAM and GREM1 (deletion/duplication only).    11/15/2021 Surgery   Left breast lumpectomy: 2.4 cm grade 3 invasive ductal carcinoma, margins -2 sentinel lymph nodes negative.   12/10/2021 -  Chemotherapy   Patient is on Treatment Plan : BREAST Paclitaxel + Trastuzumab q7d / Trastuzumab q21d     03/26/2022 - 04/22/2022 Radiation Therapy   03/26/2022 through 04/22/2022 Site Technique Total Dose (Gy) Dose per Fx (Gy) Completed Fx Beam Energies  Breast, Left: Breast_L 3D 42.56/42.56 2.66 16/16 6X  Breast, Left: Breast_L_Bst 3D 8/8 2 4/4 6X     INTERVAL HISTORY:  Patricia Marshall returns for a follow up before maintenance trastuzumab. She and her husband once again had a list of questions and wanted to go over most of these concerns.  Patricia Marshall has been doing well overall.  She is wondering if her radiation had any internal effects although externally she appears to be healing well.  She also had some concerns about cough, some residual neuropathy which has overall been improving, her recent echo, her vascular ultrasound and role for anticoagulation at this point.  She also had done some research and wanted to discuss about the risk of recurrence in detail and antiestrogen therapy.  Again her  husband also had some list of questions which she wanted to run by me. Overall she is doing really well.  She she has persistent left lower  extremity swelling.  Rest of the pertinent 10 point ROS reviewed and negative MEDICAL HISTORY:  Past Medical History:  Diagnosis Date   Arthritis    Breast cancer ([place])    Chicken pox    High cholesterol    PONV (postoperative nausea and vomiting)    Vitamin D deficiency     SURGICAL HISTORY: Past Surgical History:  Procedure Laterality Date   [place]   BREAST LUMPECTOMY WITH AXILLARY LYMPH NODE BIOPSY Left 11/15/2021   Procedure: LEFT BREAST LUMPECTOMY WITH AXILLARY SENTINEL LYMPH NODE BIOPSY;  Surgeon: Rolm Bookbinder, MD;  Location: [facility];  Service: General;  Laterality: Left;   PORTACATH PLACEMENT Right 11/15/2021   Procedure: INSERTION PORT-A-CATH;  Surgeon: Rolm Bookbinder, MD;  Location: [facility];  Service: General;  Laterality: Right;   TONSILLECTOMY AND ADENOIDECTOMY  1954    SOCIAL HISTORY: Social History   Socioeconomic History   Marital status: Married    Spouse name: Not on file   Number of children: Not on file   Years of education: Not on file   Highest education level: Not on file  Occupational History   Not on file  Tobacco Use   Smoking status: Never   Smokeless tobacco: Never  Vaping Use   Vaping Use: Never used  Substance and Sexual Activity   Alcohol use: Yes    Alcohol/week: 1.0 standard drink of alcohol    Types: 1 Glasses of wine per week   Drug use: Never   Sexual activity: Not on file  Other Topics Concern   Not on file  Social History Narrative   Married    Retired    Investment banker, operational of Radio broadcast assistant Strain: [place]  (09/03/2021)   Overall Financial Resource Strain (CARDIA)    Difficulty of Paying Living Expenses: Not hard at all  Food Insecurity: No Food Insecurity (09/03/2021)   Hunger Vital Sign    Worried About Running Out of Food in the Last Year: Never true    [place] in the Last Year: Never true  Transportation Needs: No  Transportation Needs (09/03/2021)   PRAPARE - Hydrologist (Medical): No    Lack of Transportation (Non-Medical): No  Physical Activity: Sufficiently Active (09/03/2021)   Exercise Vital Sign    Days of Exercise per Week: 4 days    Minutes of Exercise per Session: 120 min  Stress: No Stress Concern Present (09/03/2021)   [place]    Feeling of Stress : Not at all  Social Connections: [place] (09/03/2021)   Social Connection and Isolation Panel [NHANES]    Frequency of Communication with Friends and Family: More than three times a week    Frequency of Social Gatherings with Friends and Family: More than three times a week    Attends Religious Services: More than 4 times per year    Active Member of [employer] or Organizations: Yes    Attends Archivist Meetings: More than 4 times per year    Marital Status: Married  Human resources officer Violence: Not At Risk (09/03/2021)   Humiliation, Afraid, Rape, and Kick questionnaire    Fear of Current or Ex-Partner:  No    Emotionally Abused: No    Physically Abused: No    Sexually Abused: No    FAMILY HISTORY: Family History  Problem Relation Age of Onset   Stomach cancer Mother 51   Heart attack Father 9   Cancer Sister        dx mid 83s; unknown primary w/ mets; treated like ovarian cancer   Heart attack Brother 88   Heart attack Brother 106    ALLERGIES:  is allergic to other, ampicillin, and anesthesia [id number] [propofol].  MEDICATIONS:  Current Outpatient Medications  Medication Sig Dispense Refill   apixaban (ELIQUIS) 5 MG TABS tablet Take 1 tablet (5 mg total) by mouth 2 (two) times daily. 60 tablet 3   Cholecalciferol (VITAMIN D) 50 MCG (2000 UT) tablet Take 2,000 Units by mouth daily.     lidocaine-prilocaine (EMLA) cream Apply to affected area once 30 g 3   ondansetron (ZOFRAN) 4 MG tablet Take 1 tablet (4 mg total) by  mouth daily as needed for nausea or vomiting. (Patient not taking: Reported on 04/23/2022) 10 tablet 0   ondansetron (ZOFRAN) 8 MG tablet Take 1 tablet (8 mg total) by mouth every 8 (eight) hours as needed for nausea or vomiting. (Patient not taking: Reported on 04/23/2022) 30 tablet 1   prochlorperazine (COMPAZINE) 10 MG tablet Take 1 tablet (10 mg total) by mouth every 6 (six) hours as needed for nausea or vomiting. (Patient not taking: Reported on 04/23/2022) 30 tablet 1   No current facility-administered medications for this visit.    PHYSICAL EXAMINATION: ECOG PERFORMANCE STATUS: 1 - Symptomatic but completely ambulatory  Day 1, Cycle 4 03/12/22  Temp 98.1 F (36.7 C)  Temp src Oral  Pulse 69  Resp 16  BP 135/68   Vitals:   05/13/22 1136  BP: (!) 167/72  Pulse: 72  Resp: 16  Temp: 97.7 F (36.5 C)  SpO2: 100%     Filed Weights   05/13/22 1136  Weight: 123 lb 9.6 oz (56.1 kg)     Physical Exam Constitutional:      Appearance: Normal appearance. She is normal weight.  Cardiovascular:     Rate and Rhythm: Normal rate and regular rhythm.     Pulses: Normal pulses.     Heart sounds: Normal heart sounds.  Pulmonary:     Effort: Pulmonary effort is normal.     Breath sounds: Normal breath sounds.  Chest:     Comments: Port site appears well  Musculoskeletal:        General: Swelling (Left leg edema, mild) present. No tenderness.     Cervical back: Normal range of motion and neck supple. No rigidity.  Lymphadenopathy:     Cervical: No cervical adenopathy.  Skin:    General: Skin is warm and dry.  Neurological:     General: No focal deficit present.     Mental Status: She is alert.  Psychiatric:        Mood and Affect: Mood normal.   LABORATORY DATA:  I have reviewed the data as listed Lab Results  Component Value Date   WBC 4.5 05/13/2022   HGB 12.5 05/13/2022   HCT 36.5 05/13/2022   MCV 88.0 05/13/2022   PLT 160 05/13/2022   Lab Results  Component  Value Date   NA 138 04/23/2022   K 4.5 04/23/2022   CL 105 04/23/2022   CO2 27 04/23/2022    RADIOGRAPHIC STUDIES: I have personally reviewed the radiological  reports and agreed with the findings in the report.  ASSESSMENT AND PLAN:  Patricia Marshall is a 77 y.o. female who returns for a follow up for left breast cancer.   #Left breast IDC: -Grade 2, ER +90% strong staining, PR 0% negative, HER2 positive by IHC 3+, Ki-67 of 30%  -Underwent left breast lumpectomy on 11/15/2021.  -Recommended adjuvant chemotherapy with Paclitaxel and Trastuzumab for 12 cycles followed by Trastuzumab maintenance for a total of 1 year. Started therapy on 12/10/2021. -Discontinued Paclitaxel with Cycle 4, Day 15 due to neuropathy.  -She is now on Herceptin maintenance.  She recently completed adjuvant radiation.  She overall tolerated chemotherapy very well.  She had list of questions today and her husband had a separate list which they wanted to run by me.  They wanted to make sure they understand the plan in a comprehensive manner.  #Neuropathy: --Grade 1, improved significantly  #LLE DVT: --Currently on Eliquis 5 mg twice daily.  -- Recent ultrasound with no evidence of DVT.  Okay to discontinue anticoagulation at this time.  # ER positive breast cancer, have discussed about the importance of antiestrogen therapy.  We have once again reviewed options including tamoxifen versus aromatase inhibitors but I would personally recommend aromatase inhibitor since she had DVT postop.  We have discussed about aromatase inhibitors adverse effects including postmenopausal symptoms, bone density loss.  I have ordered a baseline bone density, last bone density in 2020 with osteopenia.  She is okay with initiating antiestrogen therapy with anastrozole which has been prescribed to the pharmacy of her choice.  She will return to clinic in 6 weeks.  Repeat echo in about 3 months.  Total time spent: 40 minutes including  history, physical exam, review of records, counseling and coordination of care.  Most of the time was spent reviewing all their questions and addressing their concerns. All questions were answered. The patient knows to call the clinic with any problems, questions or concerns.  Benay Pike MD

## 2022-05-13 NOTE — Progress Notes (Signed)
Calculate trastuzumab dose slightly outside of 10%. Will continue 300 mg today  Larene Beach, PharmD

## 2022-05-13 NOTE — Patient Instructions (Signed)
[facility]  Discharge Instructions: Thank you for choosing [facility] to provide your oncology and hematology care.   If you have a lab appointment with the [facility], please go directly to the [facility] and check in at the registration area.   Wear comfortable clothing and clothing appropriate for easy access to any Portacath or PICC line.   We strive to give you quality time with your provider. You may need to reschedule your appointment if you arrive late (15 or more minutes).  Arriving late affects you and other patients whose appointments are after yours.  Also, if you miss three or more appointments without notifying the office, you may be dismissed from the clinic at the provider's discretion.      For prescription refill requests, have your pharmacy contact our office and allow 72 hours for refills to be completed.    Today you received the following chemotherapy and/or immunotherapy agents: trastuzumab-anns      To help prevent nausea and vomiting after your treatment, we encourage you to take your nausea medication as directed.  BELOW ARE SYMPTOMS THAT SHOULD BE REPORTED IMMEDIATELY: *FEVER GREATER THAN 100.4 F (38 C) OR HIGHER *CHILLS OR SWEATING *NAUSEA AND VOMITING THAT IS NOT CONTROLLED WITH YOUR NAUSEA MEDICATION *UNUSUAL SHORTNESS OF BREATH *UNUSUAL BRUISING OR BLEEDING *URINARY PROBLEMS (pain or burning when urinating, or frequent urination) *BOWEL PROBLEMS (unusual diarrhea, constipation, pain near the anus) TENDERNESS IN MOUTH AND THROAT WITH OR WITHOUT PRESENCE OF ULCERS (sore throat, sores in mouth, or a toothache) UNUSUAL RASH, SWELLING OR PAIN  UNUSUAL VAGINAL DISCHARGE OR ITCHING   Items with * indicate a potential emergency and should be followed up as soon as possible or go to the Emergency Department if any problems should occur.  Please show the CHEMOTHERAPY ALERT CARD or IMMUNOTHERAPY ALERT CARD  at check-in to the Emergency Department and triage nurse.  Should you have questions after your visit or need to cancel or reschedule your appointment, please contact [facility]  Dept: [phone]  and follow the prompts.  Office hours are 8:00 a.m. to 4:30 p.m. Monday - Friday. Please note that voicemails left after 4:00 p.m. may not be returned until the following business day.  We are closed weekends and major holidays. You have access to a nurse at all times for urgent questions. Please call the main number to the clinic Dept: [phone] and follow the prompts.   For any non-urgent questions, you may also contact your provider using MyChart. We now offer e-Visits for anyone 72 and older to request care online for non-urgent symptoms. For details visit [email or web address].   Also download the MyChart app! Go to the app store, search "MyChart", open the app, select [facility], and log in with your MyChart username and password.

## 2022-05-14 ENCOUNTER — Telehealth: Payer: Self-pay | Admitting: Hematology and Oncology

## 2022-05-14 NOTE — Telephone Encounter (Signed)
Spoke with patient confirming upcoming appointments  

## 2022-05-20 ENCOUNTER — Ambulatory Visit
Admission: RE | Admit: 2022-05-20 | Discharge: 2022-05-20 | Disposition: A | Discharge status: Home / Self Care | Payer: Medicare HMO | Source: Ambulatory Visit | Attending: Hematology and Oncology | Admitting: Hematology and Oncology

## 2022-05-20 ENCOUNTER — Ambulatory Visit
Admission: RE | Admit: 2022-05-20 | Discharge: 2022-05-20 | Disposition: A | Discharge status: Home / Self Care | Payer: Medicare HMO | Source: Ambulatory Visit | Attending: Radiation Oncology | Admitting: Radiation Oncology

## 2022-05-20 ENCOUNTER — Other Ambulatory Visit: Payer: Self-pay | Admitting: *Deleted

## 2022-05-20 DIAGNOSIS — Z170 Estrogen receptor positive status [ER+]: Secondary | ICD-10-CM

## 2022-05-20 DIAGNOSIS — Z780 Asymptomatic menopausal state: Secondary | ICD-10-CM | POA: Diagnosis not present

## 2022-05-20 DIAGNOSIS — M8589 Other specified disorders of bone density and structure, multiple sites: Secondary | ICD-10-CM | POA: Diagnosis not present

## 2022-05-20 NOTE — Progress Notes (Signed)
  Radiation Oncology         [id number]) [phone] ________________________________  Name: Patricia Marshall MRN: [id number]  Date of Service: 05/20/2022  DOB: 10/14/1945  Post Treatment Telephone Note  Diagnosis:  Stage IIA, pT2N0M0, grade 2, ER positive, HER2 amplified invasive ductal carcinoma of the left breast.   Intent: Curative  Radiation Treatment Dates: 03/26/2022 through 04/22/2022 Site Technique Total Dose (Gy) Dose per Fx (Gy) Completed Fx Beam Energies  Breast, Left: Breast_L 3D 42.56/42.56 2.66 16/16 6X  Breast, Left: Breast_L_Bst 3D 8/8 2 4/4 6X   (as documented in provider EOT note)   The patient was available for call today.   Symptoms of fatigue have improved since completing therapy.  Symptoms of skin changes have improved since completing therapy.  The patient was encouraged to avoid sun exposure in the area of prior treatment for up to one year following radiation with either sunscreen or by the style of clothing worn in the sun.  The patient has scheduled follow up with her medical oncologist Dr. Chryl Heck for ongoing surveillance, and was encouraged to call if she develops concerns or questions regarding radiation.    This concludes the interview.   Leandra Kern, LPN

## 2022-06-03 ENCOUNTER — Ambulatory Visit: Payer: Medicare HMO | Admitting: Hematology and Oncology

## 2022-06-03 ENCOUNTER — Other Ambulatory Visit: Payer: Self-pay

## 2022-06-03 ENCOUNTER — Inpatient Hospital Stay: Payer: Medicare HMO | Attending: Hematology and Oncology

## 2022-06-03 ENCOUNTER — Inpatient Hospital Stay: Payer: Medicare HMO

## 2022-06-03 ENCOUNTER — Encounter: Payer: Self-pay | Admitting: *Deleted

## 2022-06-03 VITALS — BP 121/59 | HR 66 | Temp 98.1°F | Resp 16 | Wt 125.2 lb

## 2022-06-03 DIAGNOSIS — C50212 Malignant neoplasm of upper-inner quadrant of left female breast: Secondary | ICD-10-CM | POA: Diagnosis present

## 2022-06-03 DIAGNOSIS — Z79899 Other long term (current) drug therapy: Secondary | ICD-10-CM | POA: Insufficient documentation

## 2022-06-03 DIAGNOSIS — Z170 Estrogen receptor positive status [ER+]: Secondary | ICD-10-CM | POA: Insufficient documentation

## 2022-06-03 DIAGNOSIS — Z923 Personal history of irradiation: Secondary | ICD-10-CM | POA: Insufficient documentation

## 2022-06-03 DIAGNOSIS — Z86718 Personal history of other venous thrombosis and embolism: Secondary | ICD-10-CM | POA: Diagnosis not present

## 2022-06-03 DIAGNOSIS — Z5112 Encounter for antineoplastic immunotherapy: Secondary | ICD-10-CM | POA: Insufficient documentation

## 2022-06-03 DIAGNOSIS — Z7901 Long term (current) use of anticoagulants: Secondary | ICD-10-CM | POA: Diagnosis not present

## 2022-06-03 DIAGNOSIS — Z95828 Presence of other vascular implants and grafts: Secondary | ICD-10-CM

## 2022-06-03 LAB — CBC WITH DIFFERENTIAL/PLATELET
Abs Immature Granulocytes: 0 10*3/uL (ref 0.00–0.07)
Basophils Absolute: 0 10*3/uL (ref 0.0–0.1)
Basophils Relative: 1 %
Eosinophils Absolute: 0 10*3/uL (ref 0.0–0.5)
Eosinophils Relative: 1 %
HCT: 36.3 % (ref 36.0–46.0)
Hemoglobin: 12.6 g/dL (ref 12.0–15.0)
Immature Granulocytes: 0 %
Lymphocytes Relative: 22 %
Lymphs Abs: 1 10*3/uL (ref 0.7–4.0)
MCH: 29.8 pg (ref 26.0–34.0)
MCHC: 34.7 g/dL (ref 30.0–36.0)
MCV: 85.8 fL (ref 80.0–100.0)
Monocytes Absolute: 0.5 10*3/uL (ref 0.1–1.0)
Monocytes Relative: 10 %
Neutro Abs: 3.2 10*3/uL (ref 1.7–7.7)
Neutrophils Relative %: 66 %
Platelets: 143 10*3/uL — ABNORMAL LOW (ref 150–400)
RBC: 4.23 MIL/uL (ref 3.87–5.11)
RDW: 12.3 % (ref 11.5–15.5)
WBC: 4.7 10*3/uL (ref 4.0–10.5)
nRBC: 0 % (ref 0.0–0.2)

## 2022-06-03 LAB — CMP (CANCER CENTER ONLY)
ALT: 13 U/L (ref 0–44)
AST: 17 U/L (ref 15–41)
Albumin: 4.2 g/dL (ref 3.5–5.0)
Alkaline Phosphatase: 82 U/L (ref 38–126)
Anion gap: 6 (ref 5–15)
BUN: 21 mg/dL (ref 8–23)
CO2: 28 mmol/L (ref 22–32)
Calcium: 9.6 mg/dL (ref 8.9–10.3)
Chloride: 104 mmol/L (ref 98–111)
Creatinine: 0.63 mg/dL (ref 0.44–1.00)
GFR, Estimated: >60 mL/min (ref >60)
Glucose, Bld: 98 mg/dL (ref 70–99)
Potassium: 4.4 mmol/L (ref 3.5–5.1)
Sodium: 138 mmol/L (ref 135–145)
Total Bilirubin: 0.6 mg/dL (ref 0.3–1.2)
Total Protein: 6.9 g/dL (ref 6.5–8.1)

## 2022-06-03 MED ORDER — SODIUM CHLORIDE 0.9% FLUSH
10.0000 mL | Freq: Once | INTRAVENOUS | Status: AC
  Administered 2022-06-03: 10 mL

## 2022-06-03 MED ORDER — LORATADINE 10 MG PO TABS
10.0000 mg | ORAL_TABLET | Freq: Every day | ORAL | Status: DC
  Administered 2022-06-03: 10 mg via ORAL
  Filled 2022-06-03: qty 1

## 2022-06-03 MED ORDER — SODIUM CHLORIDE 0.9 % IV SOLN: Freq: Once | INTRAVENOUS | Status: AC

## 2022-06-03 MED ORDER — SODIUM CHLORIDE 0.9% FLUSH
10.0000 mL | INTRAVENOUS | Status: DC | PRN
  Administered 2022-06-03: 10 mL

## 2022-06-03 MED ORDER — HEPARIN SOD (PORK) LOCK FLUSH 100 UNIT/ML IV SOLN
500.0000 [IU] | Freq: Once | INTRAVENOUS | Status: AC | PRN
  Administered 2022-06-03: 500 [IU]

## 2022-06-03 MED ORDER — TRASTUZUMAB-ANNS CHEMO 150 MG IV SOLR
6.0000 mg/kg | Freq: Once | INTRAVENOUS | Status: AC
  Administered 2022-06-03: 300 mg via INTRAVENOUS
  Filled 2022-06-03: qty 14.29

## 2022-06-03 MED ORDER — ACETAMINOPHEN 325 MG PO TABS
650.0000 mg | ORAL_TABLET | Freq: Once | ORAL | Status: AC
  Administered 2022-06-03: 650 mg via ORAL
  Filled 2022-06-03: qty 2

## 2022-06-03 NOTE — Patient Instructions (Signed)
[facility]  Discharge Instructions: Thank you for choosing [facility] to provide your oncology and hematology care.   If you have a lab appointment with the Cancer Center, please go directly to the Cancer Center and check in at the registration area.   Wear comfortable clothing and clothing appropriate for easy access to any Portacath or PICC line.   We strive to give you quality time with your provider. You may need to reschedule your appointment if you arrive late (15 or more minutes).  Arriving late affects you and other patients whose appointments are after yours.  Also, if you miss three or more appointments without notifying the office, you may be dismissed from the clinic at the provider's discretion.      For prescription refill requests, have your pharmacy contact our office and allow 72 hours for refills to be completed.    Today you received the following chemotherapy and/or immunotherapy agents: trastuzumab-anns      To help prevent nausea and vomiting after your treatment, we encourage you to take your nausea medication as directed.  BELOW ARE SYMPTOMS THAT SHOULD BE REPORTED IMMEDIATELY: *FEVER GREATER THAN 100.4 F (38 C) OR HIGHER *CHILLS OR SWEATING *NAUSEA AND VOMITING THAT IS NOT CONTROLLED WITH YOUR NAUSEA MEDICATION *UNUSUAL SHORTNESS OF BREATH *UNUSUAL BRUISING OR BLEEDING *URINARY PROBLEMS (pain or burning when urinating, or frequent urination) *BOWEL PROBLEMS (unusual diarrhea, constipation, pain near the anus) TENDERNESS IN MOUTH AND THROAT WITH OR WITHOUT PRESENCE OF ULCERS (sore throat, sores in mouth, or a toothache) UNUSUAL RASH, SWELLING OR PAIN  UNUSUAL VAGINAL DISCHARGE OR ITCHING   Items with * indicate a potential emergency and should be followed up as soon as possible or go to the Emergency Department if any problems should occur.  Please show the CHEMOTHERAPY ALERT CARD or IMMUNOTHERAPY ALERT CARD  at check-in to the Emergency Department and triage nurse.  Should you have questions after your visit or need to cancel or reschedule your appointment, please contact [facility]  Dept: [phone]  and follow the prompts.  Office hours are 8:00 a.m. to 4:30 p.m. Monday - Friday. Please note that voicemails left after 4:00 p.m. may not be returned until the following business day.  We are closed weekends and major holidays. You have access to a nurse at all times for urgent questions. Please call the main number to the clinic Dept: [phone] and follow the prompts.   For any non-urgent questions, you may also contact your provider using MyChart. We now offer e-Visits for anyone 18 and older to request care online for non-urgent symptoms. For details visit [email or web address].   Also download the MyChart app! Go to the app store, search "MyChart", open the app, select [facility], and log in with your MyChart username and password.   

## 2022-06-05 DIAGNOSIS — C50212 Malignant neoplasm of upper-inner quadrant of left female breast: Secondary | ICD-10-CM | POA: Diagnosis not present

## 2022-06-05 DIAGNOSIS — Z170 Estrogen receptor positive status [ER+]: Secondary | ICD-10-CM | POA: Diagnosis not present

## 2022-06-06 ENCOUNTER — Encounter: Payer: Self-pay | Admitting: Adult Health

## 2022-06-06 NOTE — Telephone Encounter (Signed)
Please advise 

## 2022-06-10 ENCOUNTER — Encounter: Payer: Self-pay | Admitting: *Deleted

## 2022-06-10 ENCOUNTER — Ambulatory Visit: Payer: Medicare HMO | Attending: General Surgery

## 2022-06-10 VITALS — Wt 125.0 lb

## 2022-06-10 DIAGNOSIS — Z170 Estrogen receptor positive status [ER+]: Secondary | ICD-10-CM

## 2022-06-10 DIAGNOSIS — Z483 Aftercare following surgery for neoplasm: Secondary | ICD-10-CM | POA: Insufficient documentation

## 2022-06-10 NOTE — Therapy (Signed)
OUTPATIENT PHYSICAL THERAPY SOZO SCREENING NOTE   Patient Name: Patricia Marshall MRN: [id number] DOB:02-06-1946, 77 y.o., female Today's Date: 06/10/2022  PCP: Dorothyann Peng, NP REFERRING PROVIDER: Rolm Bookbinder, MD   PT End of Session - 06/10/22 1513     Visit Number 3   # unchanged due to screen only   PT Start Time 1510    PT Stop Time 1515    PT Time Calculation (min) 5 min    Activity Tolerance Patient tolerated treatment well    Behavior During Therapy WFL for tasks assessed/performed             Past Medical History:  Diagnosis Date   Arthritis    Breast cancer ([place])    Chicken pox    High cholesterol    PONV (postoperative nausea and vomiting)    Vitamin D deficiency    Past Surgical History:  Procedure Laterality Date   [place] LUMPECTOMY WITH AXILLARY LYMPH NODE BIOPSY Left 11/15/2021   Procedure: LEFT BREAST LUMPECTOMY WITH AXILLARY SENTINEL LYMPH NODE BIOPSY;  Surgeon: Rolm Bookbinder, MD;  Location: [facility];  Service: General;  Laterality: Left;   PORTACATH PLACEMENT Right 11/15/2021   Procedure: INSERTION PORT-A-CATH;  Surgeon: Rolm Bookbinder, MD;  Location: [facility];  Service: General;  Laterality: Right;   [place]   Patient Active Problem List   Diagnosis Date Noted   Acute deep vein thrombosis (DVT) of left lower extremity ([place]) 04/23/2022   Port-A-Cath in place 02/04/2022   Genetic testing 12/07/2021   Malignant neoplasm of upper-inner quadrant of left breast in female, estrogen receptor positive ([place]) 10/19/2021   Piriformis syndrome of left side 07/12/2015   Degenerative arthritis of left knee 07/12/2015   BEE STING REACTION, LOCAL 10/24/2006    REFERRING DIAG: left breast cancer at risk for lymphedema  THERAPY DIAG: Aftercare following surgery for neoplasm  PERTINENT HISTORY: Patient was diagnosed on 09/20/2021 with  left grade 2 invasive ductal carcinoma breast cancer. She underwent a left lumpectomy and sentinel node biopsy (2 negative nodes) on 11/15/2021. It is ER positive, PR negative, and HER2 positive with a Ki67 of 30%   PRECAUTIONS: left UE Lymphedema risk, None  SUBJECTIVE: Pt returns for her 3 month L-Dex screen.  "My Lt breast has felt really heavy and firm since I finished radiation."  PAIN:  Are you having pain? No  SOZO SCREENING: Patient was assessed today using the SOZO machine to determine the lymphedema index score. This was compared to her baseline score. It was determined that she is within the recommended range when compared to her baseline and no further action is needed at this time. She will continue SOZO screenings. These are done every 3 months for 2 years post operatively followed by every 6 months for 2 years, and then annually.  Patient reported a change in status to PTA which initiated the PTA consulting with a PT. PT determined it would be appropriate to initiate therapy at this time. PT requested a referral from patient's provider.    L-DEX FLOWSHEETS - 06/10/22 1500       L-DEX LYMPHEDEMA SCREENING   Measurement Type Unilateral    L-DEX MEASUREMENT EXTREMITY Upper Extremity    POSITION  Standing    DOMINANT SIDE Right    At Risk Side Left    BASELINE SCORE (UNILATERAL) -3    L-DEX SCORE (  UNILATERAL) -0.4    VALUE CHANGE (UNILAT) 2.6              Patricia Marshall, PTA 06/10/2022, 3:43 PM

## 2022-06-18 ENCOUNTER — Other Ambulatory Visit: Payer: Self-pay

## 2022-06-25 ENCOUNTER — Inpatient Hospital Stay: Payer: Medicare HMO | Admitting: Hematology and Oncology

## 2022-06-25 ENCOUNTER — Inpatient Hospital Stay: Payer: Medicare HMO | Attending: Hematology and Oncology

## 2022-06-25 ENCOUNTER — Encounter: Payer: Self-pay | Admitting: Hematology and Oncology

## 2022-06-25 ENCOUNTER — Inpatient Hospital Stay: Payer: Medicare HMO

## 2022-06-25 VITALS — BP 136/63 | HR 66 | Temp 97.9°F | Resp 16 | Ht 65.0 in | Wt 125.7 lb

## 2022-06-25 DIAGNOSIS — C50212 Malignant neoplasm of upper-inner quadrant of left female breast: Secondary | ICD-10-CM

## 2022-06-25 DIAGNOSIS — Z5112 Encounter for antineoplastic immunotherapy: Secondary | ICD-10-CM | POA: Insufficient documentation

## 2022-06-25 DIAGNOSIS — Z170 Estrogen receptor positive status [ER+]: Secondary | ICD-10-CM | POA: Insufficient documentation

## 2022-06-25 DIAGNOSIS — Z79811 Long term (current) use of aromatase inhibitors: Secondary | ICD-10-CM | POA: Diagnosis not present

## 2022-06-25 DIAGNOSIS — Z923 Personal history of irradiation: Secondary | ICD-10-CM | POA: Diagnosis not present

## 2022-06-25 DIAGNOSIS — I890 Lymphedema, not elsewhere classified: Secondary | ICD-10-CM | POA: Diagnosis not present

## 2022-06-25 DIAGNOSIS — R5383 Other fatigue: Secondary | ICD-10-CM | POA: Diagnosis not present

## 2022-06-25 DIAGNOSIS — D696 Thrombocytopenia, unspecified: Secondary | ICD-10-CM | POA: Diagnosis not present

## 2022-06-25 DIAGNOSIS — Z95828 Presence of other vascular implants and grafts: Secondary | ICD-10-CM

## 2022-06-25 LAB — CBC WITH DIFFERENTIAL (CANCER CENTER ONLY)
Abs Immature Granulocytes: 0.01 10*3/uL (ref 0.00–0.07)
Basophils Absolute: 0 10*3/uL (ref 0.0–0.1)
Basophils Relative: 1 %
Eosinophils Absolute: 0.1 10*3/uL (ref 0.0–0.5)
Eosinophils Relative: 2 %
HCT: 35.5 % — ABNORMAL LOW (ref 36.0–46.0)
Hemoglobin: 12.1 g/dL (ref 12.0–15.0)
Immature Granulocytes: 0 %
Lymphocytes Relative: 23 %
Lymphs Abs: 0.8 10*3/uL (ref 0.7–4.0)
MCH: 29.4 pg (ref 26.0–34.0)
MCHC: 34.1 g/dL (ref 30.0–36.0)
MCV: 86.2 fL (ref 80.0–100.0)
Monocytes Absolute: 0.5 10*3/uL (ref 0.1–1.0)
Monocytes Relative: 14 %
Neutro Abs: 2.2 10*3/uL (ref 1.7–7.7)
Neutrophils Relative %: 60 %
Platelet Count: 116 10*3/uL — ABNORMAL LOW (ref 150–400)
RBC: 4.12 MIL/uL (ref 3.87–5.11)
RDW: 12.3 % (ref 11.5–15.5)
WBC Count: 3.6 10*3/uL — ABNORMAL LOW (ref 4.0–10.5)
nRBC: 0 % (ref 0.0–0.2)

## 2022-06-25 LAB — CMP (CANCER CENTER ONLY)
ALT: 11 U/L (ref 0–44)
AST: 15 U/L (ref 15–41)
Albumin: 4.1 g/dL (ref 3.5–5.0)
Alkaline Phosphatase: 85 U/L (ref 38–126)
Anion gap: 6 (ref 5–15)
BUN: 19 mg/dL (ref 8–23)
CO2: 28 mmol/L (ref 22–32)
Calcium: 9.7 mg/dL (ref 8.9–10.3)
Chloride: 105 mmol/L (ref 98–111)
Creatinine: 0.7 mg/dL (ref 0.44–1.00)
GFR, Estimated: >60 mL/min (ref >60)
Glucose, Bld: 101 mg/dL — ABNORMAL HIGH (ref 70–99)
Potassium: 4.5 mmol/L (ref 3.5–5.1)
Sodium: 139 mmol/L (ref 135–145)
Total Bilirubin: 0.6 mg/dL (ref 0.3–1.2)
Total Protein: 6.4 g/dL — ABNORMAL LOW (ref 6.5–8.1)

## 2022-06-25 MED ORDER — HEPARIN SOD (PORK) LOCK FLUSH 100 UNIT/ML IV SOLN
500.0000 [IU] | Freq: Once | INTRAVENOUS | Status: AC | PRN
  Administered 2022-06-25: 500 [IU]

## 2022-06-25 MED ORDER — SODIUM CHLORIDE 0.9% FLUSH
10.0000 mL | Freq: Once | INTRAVENOUS | Status: AC
  Administered 2022-06-25: 10 mL

## 2022-06-25 MED ORDER — LORATADINE 10 MG PO TABS
10.0000 mg | ORAL_TABLET | Freq: Once | ORAL | Status: AC
  Administered 2022-06-25: 10 mg via ORAL
  Filled 2022-06-25: qty 1

## 2022-06-25 MED ORDER — ACETAMINOPHEN 325 MG PO TABS
650.0000 mg | ORAL_TABLET | Freq: Once | ORAL | Status: AC
  Administered 2022-06-25: 650 mg via ORAL
  Filled 2022-06-25: qty 2

## 2022-06-25 MED ORDER — SODIUM CHLORIDE 0.9% FLUSH
10.0000 mL | INTRAVENOUS | Status: DC | PRN
  Administered 2022-06-25: 10 mL

## 2022-06-25 MED ORDER — SODIUM CHLORIDE 0.9 % IV SOLN: Freq: Once | INTRAVENOUS | Status: AC

## 2022-06-25 MED ORDER — TRASTUZUMAB-ANNS CHEMO 150 MG IV SOLR
350.0000 mg | Freq: Once | INTRAVENOUS | Status: AC
  Administered 2022-06-25: 350 mg via INTRAVENOUS
  Filled 2022-06-25: qty 16.67

## 2022-06-25 NOTE — Addendum Note (Signed)
Addended by: Neita Goodnight on: 06/25/2022 01:48 PM   Modules accepted: Orders

## 2022-06-25 NOTE — Patient Instructions (Signed)
[facility]  Discharge Instructions: Thank you for choosing [facility] to provide your oncology and hematology care.   If you have a lab appointment with the Cancer Center, please go directly to the Cancer Center and check in at the registration area.   Wear comfortable clothing and clothing appropriate for easy access to any Portacath or PICC line.   We strive to give you quality time with your provider. You may need to reschedule your appointment if you arrive late (15 or more minutes).  Arriving late affects you and other patients whose appointments are after yours.  Also, if you miss three or more appointments without notifying the office, you may be dismissed from the clinic at the provider's discretion.      For prescription refill requests, have your pharmacy contact our office and allow 72 hours for refills to be completed.    Today you received the following chemotherapy and/or immunotherapy agents: Trastuzumab      To help prevent nausea and vomiting after your treatment, we encourage you to take your nausea medication as directed.  BELOW ARE SYMPTOMS THAT SHOULD BE REPORTED IMMEDIATELY: *FEVER GREATER THAN 100.4 F (38 C) OR HIGHER *CHILLS OR SWEATING *NAUSEA AND VOMITING THAT IS NOT CONTROLLED WITH YOUR NAUSEA MEDICATION *UNUSUAL SHORTNESS OF BREATH *UNUSUAL BRUISING OR BLEEDING *URINARY PROBLEMS (pain or burning when urinating, or frequent urination) *BOWEL PROBLEMS (unusual diarrhea, constipation, pain near the anus) TENDERNESS IN MOUTH AND THROAT WITH OR WITHOUT PRESENCE OF ULCERS (sore throat, sores in mouth, or a toothache) UNUSUAL RASH, SWELLING OR PAIN  UNUSUAL VAGINAL DISCHARGE OR ITCHING   Items with * indicate a potential emergency and should be followed up as soon as possible or go to the Emergency Department if any problems should occur.  Please show the CHEMOTHERAPY ALERT CARD or IMMUNOTHERAPY ALERT CARD at  check-in to the Emergency Department and triage nurse.  Should you have questions after your visit or need to cancel or reschedule your appointment, please contact [facility]  Dept: [phone]  and follow the prompts.  Office hours are 8:00 a.m. to 4:30 p.m. Monday - Friday. Please note that voicemails left after 4:00 p.m. may not be returned until the following business day.  We are closed weekends and major holidays. You have access to a nurse at all times for urgent questions. Please call the main number to the clinic Dept: [phone] and follow the prompts.   For any non-urgent questions, you may also contact your provider using MyChart. We now offer e-Visits for anyone 18 and older to request care online for non-urgent symptoms. For details visit [email or web address].   Also download the MyChart app! Go to the app store, search "MyChart", open the app, select [facility], and log in with your MyChart username and password.  

## 2022-06-25 NOTE — Progress Notes (Signed)
Increase Kanjintti dose based on most recent weight per MD. Change future orders as well.  Anola Gurney [place], [place], BCPS, BCOP 06/25/2022 10:17 AM

## 2022-06-25 NOTE — Progress Notes (Signed)
[facility] PROGRESS NOTE  Patient Care Team: [name], NP as PCP - General (Family Medicine) Emelia Loron, MD as Consulting Physician (General Surgery) Rachel Moulds, MD as Consulting Physician (Hematology and Oncology) Dorothy Puffer, MD as Consulting Physician (Radiation Oncology) Donnelly Angelica, RN as Oncology Nurse Navigator Pershing Proud, RN as Oncology Nurse Navigator  CHIEF COMPLAINTS/PURPOSE OF CONSULTATION:  Left breast cancer  SUMMARY OF ONCOLOGIC HISTORY: Oncology History  Malignant neoplasm of upper-inner quadrant of left breast in female, estrogen receptor positive  09/20/2021 Mammogram   Screening mammogram showed extremely dense breasts and a possible mass in the left breast warranting further evaluation.  Breast density category is D Diagnostic mammogram of the breast showed indeterminate mass in the 11:00 location of the left breast.  Indeterminate mass in the lower anterior left axilla.   10/04/2021 Pathology Results   Pathology showed grade 2 invasive ductal carcinoma, left axillary soft tissue needle core biopsy showed benign breast tissue with patchy changes prognostics from the breast tumor showed ER 90% positive strong staining PR 0%, negative, Ki-67 of 30% and HER2 positive for IHC 3+   10/19/2021 Initial Diagnosis   Malignant neoplasm of upper-inner quadrant of left breast in female, estrogen receptor positive (HCC)   10/23/2021 Cancer Staging   Staging form: Breast, AJCC 8th Edition - Pathologic: Stage IIA (pT2, pN0, cM0, G3, ER+, PR-, HER2+) - Signed by Rachel Moulds, MD on 11/26/2021 Histologic grading system: 3 grade system   11/10/2021 Genetic Testing   Negative hereditary cancer genetic testing: no pathogenic variants detected in [facility] CancerNext-Expanded +RNA Panel.  Report date is November 10, 2021.   The CancerNext-Expanded gene panel offered by [facility] and includes sequencing, rearrangement, and RNA analysis for the  following 77 genes: AIP, ALK, APC, ATM, AXIN2, BAP1, BARD1, BLM, BMPR1A, BRCA1, BRCA2, BRIP1, CDC73, CDH1, CDK4, CDKN1B, CDKN2A, CHEK2, CTNNA1, DICER1, FANCC, FH, FLCN, GALNT12, KIF1B, LZTR1, MAX, MEN1, MET, MLH1, MSH2, MSH3, MSH6, MUTYH, NBN, NF1, NF2, NTHL1, PALB2, PHOX2B, PMS2, POT1, PRKAR1A, PTCH1, PTEN, RAD51C, RAD51D, RB1, RECQL, RET, SDHA, SDHAF2, SDHB, SDHC, SDHD, SMAD4, SMARCA4, SMARCB1, SMARCE1, STK11, SUFU, TMEM127, TP53, TSC1, TSC2, VHL and XRCC2 (sequencing and deletion/duplication); EGFR, EGLN1, HOXB13, KIT, MITF, PDGFRA, POLD1, and POLE (sequencing only); EPCAM and GREM1 (deletion/duplication only).    11/15/2021 Surgery   Left breast lumpectomy: 2.4 cm grade 3 invasive ductal carcinoma, margins -2 sentinel lymph nodes negative.   12/10/2021 -  Chemotherapy   Patient is on Treatment Plan : BREAST Paclitaxel + Trastuzumab q7d / Trastuzumab q21d     03/26/2022 - 04/22/2022 Radiation Therapy   03/26/2022 through 04/22/2022 Site Technique Total Dose (Gy) Dose per Fx (Gy) Completed Fx Beam Energies  Breast, Left: Breast_L 3D 42.56/42.56 2.66 16/16 6X  Breast, Left: Breast_L_Bst 3D 8/8 2 4/4 6X     INTERVAL HISTORY:  Alleen Borne returns for a follow up before maintenance trastuzumab. She complains of some fatigue, headaches with anastrozole. Last ECHO Feb 2024 with EF of 60-65% Baseline bone density with osteopenia, T of -2.3 She continues to tolerate herceptin very well. She is very organized, had some questions for me today written which she wanted to go over. No bleeding issues, no new medications or OTC herbal supplements.  Rest of the pertinent 10 point ROS reviewed and negative  MEDICAL HISTORY:  Past Medical History:  Diagnosis Date   Arthritis    Breast cancer (HCC)    Chicken pox    High cholesterol  PONV (postoperative nausea and vomiting)    Vitamin D deficiency     SURGICAL HISTORY: Past Surgical History:  Procedure Laterality Date   APPENDECTOMY  1959    BACK SURGERY     1996   BREAST LUMPECTOMY WITH AXILLARY LYMPH NODE BIOPSY Left 11/15/2021   Procedure: LEFT BREAST LUMPECTOMY WITH AXILLARY SENTINEL LYMPH NODE BIOPSY;  Surgeon: Emelia Loron, MD;  Location: [facility];  Service: General;  Laterality: Left;   PORTACATH PLACEMENT Right 11/15/2021   Procedure: INSERTION PORT-A-CATH;  Surgeon: Emelia Loron, MD;  Location: [facility];  Service: General;  Laterality: Right;   TONSILLECTOMY AND ADENOIDECTOMY  1954    SOCIAL HISTORY: Social History   Socioeconomic History   Marital status: Married    Spouse name: Not on file   Number of children: Not on file   Years of education: Not on file   Highest education level: Not on file  Occupational History   Not on file  Tobacco Use   Smoking status: Never   Smokeless tobacco: Never  Vaping Use   Vaping Use: Never used  Substance and Sexual Activity   Alcohol use: Yes    Alcohol/week: 1.0 standard drink of alcohol    Types: 1 Glasses of wine per week   Drug use: Never   Sexual activity: Not on file  Other Topics Concern   Not on file  Social History Narrative   Married    Retired    International aid/development worker of Corporate investment banker Strain: Low Risk  (09/03/2021)   Overall Financial Resource Strain (CARDIA)    Difficulty of Paying Living Expenses: Not hard at all  Food Insecurity: No Food Insecurity (09/03/2021)   Hunger Vital Sign    Worried About Running Out of Food in the Last Year: Never true    Ran Out of Food in the Last Year: Never true  Transportation Needs: No Transportation Needs (09/03/2021)   PRAPARE - Administrator, Civil Service (Medical): No    Lack of Transportation (Non-Medical): No  Physical Activity: Sufficiently Active (09/03/2021)   Exercise Vital Sign    Days of Exercise per Week: 4 days    Minutes of Exercise per Session: 120 min  Stress: No Stress Concern Present (09/03/2021)   [employer] of  Occupational Health - Occupational Stress Questionnaire    Feeling of Stress : Not at all  Social Connections: Socially Integrated (09/03/2021)   Social Connection and Isolation Panel [NHANES]    Frequency of Communication with Friends and Family: More than three times a week    Frequency of Social Gatherings with Friends and Family: More than three times a week    Attends Religious Services: More than 4 times per year    Active Member of [employer] or Organizations: Yes    Attends Engineer, structural: More than 4 times per year    Marital Status: Married  Catering manager Violence: Not At Risk (09/03/2021)   Humiliation, Afraid, Rape, and Kick questionnaire    Fear of Current or Ex-Partner: No    Emotionally Abused: No    Physically Abused: No    Sexually Abused: No    FAMILY HISTORY: Family History  Problem Relation Age of Onset   Stomach cancer Mother 10   Heart attack Father 64   Cancer Sister        dx mid 35s; unknown primary w/ mets; treated like ovarian cancer   Heart attack Brother  81   Heart attack Brother 69    ALLERGIES:  is allergic to other, ampicillin, and anesthesia [id number] [propofol].  MEDICATIONS:  Current Outpatient Medications  Medication Sig Dispense Refill   anastrozole (ARIMIDEX) 1 MG tablet Take 1 tablet (1 mg total) by mouth daily. 90 tablet 3   Cholecalciferol (VITAMIN D) 50 MCG (2000 UT) tablet Take 2,000 Units by mouth daily.     lidocaine-prilocaine (EMLA) cream Apply to affected area once 30 g 3   No current facility-administered medications for this visit.    PHYSICAL EXAMINATION: ECOG PERFORMANCE STATUS: 1 - Symptomatic but completely ambulatory  Day 1, Cycle 4 03/12/22  Temp 98.1 F (36.7 C)  Temp src Oral  Pulse 69  Resp 16  BP 135/68   Vitals:   06/25/22 0845  BP: 136/63  Pulse: 66  Resp: 16  Temp: 97.9 F (36.6 C)  SpO2: 100%     Filed Weights   06/25/22 0845  Weight: 125 lb 11.2 oz (57 kg)     Physical  Exam Constitutional:      Appearance: Normal appearance. She is normal weight.  Cardiovascular:     Rate and Rhythm: Normal rate and regular rhythm.     Pulses: Normal pulses.     Heart sounds: Normal heart sounds.  Pulmonary:     Effort: Pulmonary effort is normal.     Breath sounds: Normal breath sounds.  Chest:     Comments: Port site appears well  Musculoskeletal:        General: Swelling (Left leg edema, mild) present. No tenderness.     Cervical back: Normal range of motion and neck supple. No rigidity.  Lymphadenopathy:     Cervical: No cervical adenopathy.  Skin:    General: Skin is warm and dry.  Neurological:     General: No focal deficit present.     Mental Status: She is alert.  Psychiatric:        Mood and Affect: Mood normal.    LABORATORY DATA:  I have reviewed the data as listed Lab Results  Component Value Date   WBC 3.6 (L) 06/25/2022   HGB 12.1 06/25/2022   HCT 35.5 (L) 06/25/2022   MCV 86.2 06/25/2022   PLT 116 (L) 06/25/2022   Lab Results  Component Value Date   NA 139 06/25/2022   K 4.5 06/25/2022   CL 105 06/25/2022   CO2 28 06/25/2022    RADIOGRAPHIC STUDIES: I have personally reviewed the radiological reports and agreed with the findings in the report.  ASSESSMENT AND PLAN:  Alleen BorneMary Aspasia Favorite is a 77 y.o. female who returns for a follow up for left breast cancer.   #Left breast IDC: -Grade 2, ER +90% strong staining, PR 0% negative, HER2 positive by IHC 3+, Ki-67 of 30%  -Underwent left breast lumpectomy on 11/15/2021.  -Recommended adjuvant chemotherapy with Paclitaxel and Trastuzumab for 12 cycles followed by Trastuzumab maintenance for a total of 1 year. Started therapy on 12/10/2021. -Discontinued Paclitaxel with Cycle 4, Day 15 due to neuropathy.  -She is now on Herceptin maintenance. Last ECHO with no concerns. Repeat ECHO ordered for May  #Neuropathy: --Grade 1, improved significantly  #LLE DVT: --Recent vasc US with  resolution of clot   She completed anticoagulation   # ER positive breast cancer, have discussed about the importance of antiestrogen therapy.  We have once again reviewed options including tamoxifen versus aromatase inhibitors but I would personally recommend aromatase inhibitor since she had  DVT postop. She is tolerating this well except for mild headaches and fatigue Baseline bone density with osteopenia. Encouraged calcium/vit D supplements and weight bearing exercises.  # Thrombocytopenia, mild but worse compared to last visit No recent infection, no new medication. Requested additional labs.  Total time spent: 40 minutes including history, physical exam, review of records, counseling and coordination of care.  Most of the time was spent reviewing all their questions and addressing their concerns.  All questions were answered. The patient knows to call the clinic with any problems, questions or concerns.  Rachel Moulds MD

## 2022-06-26 ENCOUNTER — Inpatient Hospital Stay: Payer: Medicare HMO

## 2022-06-26 ENCOUNTER — Telehealth: Payer: Self-pay

## 2022-06-26 DIAGNOSIS — C50212 Malignant neoplasm of upper-inner quadrant of left female breast: Secondary | ICD-10-CM | POA: Diagnosis not present

## 2022-06-26 DIAGNOSIS — R5383 Other fatigue: Secondary | ICD-10-CM | POA: Diagnosis not present

## 2022-06-26 DIAGNOSIS — Z170 Estrogen receptor positive status [ER+]: Secondary | ICD-10-CM | POA: Diagnosis not present

## 2022-06-26 DIAGNOSIS — Z923 Personal history of irradiation: Secondary | ICD-10-CM | POA: Diagnosis not present

## 2022-06-26 DIAGNOSIS — Z95828 Presence of other vascular implants and grafts: Secondary | ICD-10-CM

## 2022-06-26 DIAGNOSIS — I890 Lymphedema, not elsewhere classified: Secondary | ICD-10-CM | POA: Diagnosis not present

## 2022-06-26 DIAGNOSIS — Z79811 Long term (current) use of aromatase inhibitors: Secondary | ICD-10-CM | POA: Diagnosis not present

## 2022-06-26 DIAGNOSIS — D696 Thrombocytopenia, unspecified: Secondary | ICD-10-CM | POA: Diagnosis not present

## 2022-06-26 DIAGNOSIS — Z5112 Encounter for antineoplastic immunotherapy: Secondary | ICD-10-CM | POA: Diagnosis not present

## 2022-06-26 LAB — CBC WITH DIFFERENTIAL/PLATELET
Abs Immature Granulocytes: 0.01 10*3/uL (ref 0.00–0.07)
Basophils Absolute: 0 10*3/uL (ref 0.0–0.1)
Basophils Relative: 1 %
Eosinophils Absolute: 0.1 10*3/uL (ref 0.0–0.5)
Eosinophils Relative: 1 %
HCT: 36.4 % (ref 36.0–46.0)
Hemoglobin: 12.3 g/dL (ref 12.0–15.0)
Immature Granulocytes: 0 %
Lymphocytes Relative: 21 %
Lymphs Abs: 0.9 10*3/uL (ref 0.7–4.0)
MCH: 29.1 pg (ref 26.0–34.0)
MCHC: 33.8 g/dL (ref 30.0–36.0)
MCV: 86.1 fL (ref 80.0–100.0)
Monocytes Absolute: 0.5 10*3/uL (ref 0.1–1.0)
Monocytes Relative: 11 %
Neutro Abs: 2.8 10*3/uL (ref 1.7–7.7)
Neutrophils Relative %: 66 %
Platelets: 125 10*3/uL — ABNORMAL LOW (ref 150–400)
RBC: 4.23 MIL/uL (ref 3.87–5.11)
RDW: 12.5 % (ref 11.5–15.5)
WBC: 4.2 10*3/uL (ref 4.0–10.5)
nRBC: 0 % (ref 0.0–0.2)

## 2022-06-26 LAB — RETICULOCYTES
Immature Retic Fract: 6.4 % (ref 2.3–15.9)
RBC.: 4.2 MIL/uL (ref 3.87–5.11)
Retic Count, Absolute: 50.8 10*3/uL (ref 19.0–186.0)
Retic Ct Pct: 1.2 % (ref 0.4–3.1)

## 2022-06-26 LAB — VITAMIN B12: Vitamin B-12: 400 pg/mL (ref 180–914)

## 2022-06-26 LAB — IRON AND IRON BINDING CAPACITY (CC-WL,HP ONLY)
Iron: 79 ug/dL (ref 28–170)
Saturation Ratios: 21 % (ref 10.4–31.8)
TIBC: 370 ug/dL (ref 250–450)
UIBC: 291 ug/dL (ref 148–442)

## 2022-06-26 LAB — FERRITIN: Ferritin: 64 ng/mL (ref 11–307)

## 2022-06-26 LAB — LACTATE DEHYDROGENASE: LDH: 155 U/L (ref 98–192)

## 2022-06-26 LAB — HEPATITIS PANEL, ACUTE
HCV Ab: NONREACTIVE
Hep A IgM: NONREACTIVE
Hep B C IgM: NONREACTIVE
Hepatitis B Surface Ag: NONREACTIVE

## 2022-06-26 MED ORDER — HEPARIN SOD (PORK) LOCK FLUSH 100 UNIT/ML IV SOLN
500.0000 [IU] | Freq: Once | INTRAVENOUS | Status: AC
  Administered 2022-06-26: 500 [IU]

## 2022-06-26 MED ORDER — SODIUM CHLORIDE 0.9% FLUSH
10.0000 mL | Freq: Once | INTRAVENOUS | Status: AC
  Administered 2022-06-26: 10 mL

## 2022-06-26 NOTE — Telephone Encounter (Signed)
Pt agreeable to come in today at 11 for port flush w/lab. Scheduled.

## 2022-06-27 LAB — FOLATE RBC
Folate, Hemolysate: 480 ng/mL
Folate, RBC: 1304 ng/mL (ref >498)
Hematocrit: 36.8 % (ref 34.0–46.6)

## 2022-06-29 ENCOUNTER — Telehealth: Payer: Self-pay

## 2022-06-29 NOTE — Telephone Encounter (Signed)
Call attempt to let pt know per Dr Al Pimple: that all her labs look good, plts also slightly better. We can continue to monitor. They are not dangerously low.  Left pt phone message

## 2022-07-01 ENCOUNTER — Ambulatory Visit: Payer: Medicare HMO | Attending: General Surgery | Admitting: Physical Therapy

## 2022-07-01 ENCOUNTER — Other Ambulatory Visit: Payer: Self-pay

## 2022-07-01 ENCOUNTER — Encounter: Payer: Self-pay | Admitting: Physical Therapy

## 2022-07-01 DIAGNOSIS — C50212 Malignant neoplasm of upper-inner quadrant of left female breast: Secondary | ICD-10-CM | POA: Diagnosis not present

## 2022-07-01 DIAGNOSIS — Z483 Aftercare following surgery for neoplasm: Secondary | ICD-10-CM | POA: Diagnosis not present

## 2022-07-01 DIAGNOSIS — R293 Abnormal posture: Secondary | ICD-10-CM | POA: Diagnosis not present

## 2022-07-01 DIAGNOSIS — Z170 Estrogen receptor positive status [ER+]: Secondary | ICD-10-CM | POA: Diagnosis not present

## 2022-07-01 DIAGNOSIS — I890 Lymphedema, not elsewhere classified: Secondary | ICD-10-CM | POA: Insufficient documentation

## 2022-07-01 NOTE — Therapy (Signed)
OUTPATIENT PHYSICAL THERAPY  UPPER EXTREMITY ONCOLOGY EVALUATION  Patient Name: [facility] MRN: [id number] DOB:September 02, 1945, 77 y.o., female Today's Date: 07/01/2022  END OF SESSION:  PT End of Session - 07/01/22 1650     Visit Number 1    Number of Visits 9    Date for PT Re-Evaluation 07/29/22    PT Start Time 1606    PT Stop Time 1646    PT Time Calculation (min) 40 min    Activity Tolerance Patient tolerated treatment well    Behavior During Therapy WFL for tasks assessed/performed             Past Medical History:  Diagnosis Date   Arthritis    Breast cancer    Chicken pox    High cholesterol    PONV (postoperative nausea and vomiting)    Vitamin D deficiency    Past Surgical History:  Procedure Laterality Date   APPENDECTOMY  1959   BACK SURGERY     1996   BREAST LUMPECTOMY WITH AXILLARY LYMPH NODE BIOPSY Left 11/15/2021   Procedure: LEFT BREAST LUMPECTOMY WITH AXILLARY SENTINEL LYMPH NODE BIOPSY;  Surgeon: Emelia Loron, MD;  Location: [facility];  Service: General;  Laterality: Left;   PORTACATH PLACEMENT Right 11/15/2021   Procedure: INSERTION PORT-A-CATH;  Surgeon: Emelia Loron, MD;  Location: [facility];  Service: General;  Laterality: Right;   TONSILLECTOMY AND ADENOIDECTOMY  1954   Patient Active Problem List   Diagnosis Date Noted   Acute deep vein thrombosis (DVT) of left lower extremity 04/23/2022   Port-A-Cath in place 02/04/2022   Genetic testing 12/07/2021   Malignant neoplasm of upper-inner quadrant of left breast in female, estrogen receptor positive 10/19/2021   Piriformis syndrome of left side 07/12/2015   Degenerative arthritis of left knee 07/12/2015   BEE STING REACTION, LOCAL 10/24/2006    PCP: [name] NP   REFERRING PROVIDER: Rachel Moulds, MD   REFERRING DIAG:  C50.212,Z17.0 (ICD-10-CM) - Malignant neoplasm of upper-inner quadrant of left breast in female, estrogen receptor  positive    THERAPY DIAG:  Lymphedema, not elsewhere classified  Aftercare following surgery for neoplasm  Abnormal posture  Malignant neoplasm of upper-inner quadrant of left breast in female, estrogen receptor positive  ONSET DATE: Feb 2024  Rationale for Evaluation and Treatment: Rehabilitation  SUBJECTIVE:                                                                                                                                                                                           SUBJECTIVE STATEMENT: The swelling started towards the end of radiation  which was in Feb 2024. It worsened towards the end of Feb and in to March. It was swell and get hard. I have a compression bra. I have 2 of them. I only wore them after surgery.   PERTINENT HISTORY: Patient was diagnosed on 09/20/2021 with left grade 2 invasive ductal carcinoma breast cancer. She underwent a left lumpectomy and sentinel node biopsy (2 negative nodes) on 11/15/2021. 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 No  PRECAUTIONS: Other: L UE lymphedema risk, DVT (11/19/21)  WEIGHT BEARING RESTRICTIONS: No  FALLS:  Has patient fallen in last 6 months? No  LIVING ENVIRONMENT: Lives with: lives with their spouse Lives in: House/apartment Stairs: Yes; Internal: about 14 (has 2 sets)  steps; on left going up and External: 3 steps; none Has following equipment at home: None  OCCUPATION: retired  LEISURE: pt went to the gym today and began lifting 3 lb weights, she has been lifting 3 lbs at home, walks 30 min  HAND DOMINANCE: right   PRIOR LEVEL OF FUNCTION: Independent  PATIENT GOALS: to get the swelling down   OBJECTIVE:  COGNITION: Overall cognitive status: Within functional limits for tasks assessed   PALPATION: Fibrosis palpable in inferior breast  OBSERVATIONS / OTHER ASSESSMENTS: L breast about  30% larger than R, slightly darker in color post radiation, fibrosis present in inferior breast  POSTURE: forward head, rounded shoulders  UPPER EXTREMITY AROM/PROM: WFL  UPPER EXTREMITY STRENGTH:   Surgery type/Date: Left lumpectomy with SLNB 11/15/2021  Number of lymph nodes removed: 0/2  Current/past treatment (chemo, radiation, hormone therapy): Completed chemo and radiation. Infusions of Herceptin for a year   LYMPHEDEMA ASSESSMENTS:   LANDMARK RIGHT  eval  At axilla    15 cm proximal to olecranon process 24.7  10 cm proximal to olecranon process 23  Olecranon process 21.5  15 cm proximal to ulnar styloid process 22  10 cm proximal to ulnar styloid process 19  Just proximal to ulnar styloid process 14.5  Across hand at thumb web space 18.3  At base of 2nd digit 5.8  (Blank rows = not tested)  LANDMARK LEFT  eval  At axilla    15 cm proximal to olecranon process 24  10 cm proximal to olecranon process 22.4  Olecranon process 21.5  15 cm proximal to ulnar styloid process 21.4  10 cm proximal to ulnar styloid process 18.6  Just proximal to ulnar styloid process 14.1  Across hand at thumb web space 17.5  At base of 2nd digit 5.6  (Blank rows = not tested)   BREAST COMPLAINTS: 35   TODAY'S TREATMENT:  DATE:  07/01/22: cut 1/2 inch grey foam for pt to wear in her bra for additional compression in inferior portion with fibrosis    PATIENT EDUCATION:  Education details: anatomy and physiology of the lymphatic system, how to manage lymphedema Person educated: Patient Education method: Explanation Education comprehension: verbalized understanding  HOME EXERCISE PROGRAM: Wear compression bra during waking hours  ASSESSMENT:  CLINICAL IMPRESSION: Patient is a 77 y.o. female who was seen today for physical therapy evaluation  and treatment for L breast lymphedema. Pt completed radiation in Feb 2024 after undergoing a L breast lumpectomy and SLNB (0/2) for treatment of L breast cancer. Her swelling began during radiation. She would benefit from skilled PT services to decrease L breast lymphedema and progress pt towards independent management.     OBJECTIVE IMPAIRMENTS: decreased knowledge of condition, decreased knowledge of use of DME, increased edema, postural dysfunction, and pain.   ACTIVITY LIMITATIONS:  none  PARTICIPATION LIMITATIONS:  none  PERSONAL FACTORS:  none  are also affecting patient's functional outcome.   REHAB POTENTIAL: Good  CLINICAL DECISION MAKING: Stable/uncomplicated  EVALUATION COMPLEXITY: Low  GOALS: Goals reviewed with patient? Yes  SHORT TERM GOALS=LONG TERM GOALS Target date: 07/29/22  Pt will be independent in self MLD for long term management of lymphedema. Baseline: Goal status: INITIAL  2.  Pt will be able to independently manage her lymphedema with self MLD and her compression bra.  Baseline:  Goal status: INITIAL  3.  Pt will report a 50% improvement in feeling of discomfort and heaviness in L breast to allow improved comfort.  Baseline:  Goal status: INITIAL    PLAN:  PT FREQUENCY: 2x/week  PT DURATION: 4 weeks  PLANNED INTERVENTIONS: Therapeutic exercises, Therapeutic activity, Patient/Family education, Self Care, Orthotic/Fit training, Manual lymph drainage, Compression bandaging, scar mobilization, Vasopneumatic device, and Manual therapy  PLAN FOR NEXT SESSION: begin MLD to L breast, how was foam, how does her compression bra fit  Leonette Most, PT 07/01/2022, 4:50 PM

## 2022-07-03 ENCOUNTER — Other Ambulatory Visit: Payer: Self-pay | Admitting: *Deleted

## 2022-07-04 ENCOUNTER — Ambulatory Visit: Payer: Medicare HMO | Admitting: Physical Therapy

## 2022-07-04 ENCOUNTER — Encounter: Payer: Self-pay | Admitting: Physical Therapy

## 2022-07-04 DIAGNOSIS — Z483 Aftercare following surgery for neoplasm: Secondary | ICD-10-CM | POA: Diagnosis not present

## 2022-07-04 DIAGNOSIS — R293 Abnormal posture: Secondary | ICD-10-CM

## 2022-07-04 DIAGNOSIS — C50212 Malignant neoplasm of upper-inner quadrant of left female breast: Secondary | ICD-10-CM

## 2022-07-04 DIAGNOSIS — I890 Lymphedema, not elsewhere classified: Secondary | ICD-10-CM | POA: Diagnosis not present

## 2022-07-04 DIAGNOSIS — Z170 Estrogen receptor positive status [ER+]: Secondary | ICD-10-CM | POA: Diagnosis not present

## 2022-07-04 NOTE — Therapy (Signed)
OUTPATIENT PHYSICAL THERAPY  UPPER EXTREMITY ONCOLOGY EVALUATION  Patient Name: Patricia Marshall MRN: [id number] DOB:Mar 08, 1946, 77 y.o., female Today's Date: 07/04/2022  END OF SESSION:  PT End of Session - 07/04/22 1551     Visit Number 2    Number of Visits 9    Date for PT Re-Evaluation 07/29/22    PT Start Time 1503    PT Stop Time 1556    PT Time Calculation (min) 53 min    Activity Tolerance Patient tolerated treatment well    Behavior During Therapy WFL for tasks assessed/performed             Past Medical History:  Diagnosis Date   Arthritis    Breast cancer    Chicken pox    High cholesterol    PONV (postoperative nausea and vomiting)    Vitamin D deficiency    Past Surgical History:  Procedure Laterality Date   APPENDECTOMY  1959   BACK SURGERY     1996   BREAST LUMPECTOMY WITH AXILLARY LYMPH NODE BIOPSY Left 11/15/2021   Procedure: LEFT BREAST LUMPECTOMY WITH AXILLARY SENTINEL LYMPH NODE BIOPSY;  Surgeon: Emelia Loron, MD;  Location: [facility];  Service: General;  Laterality: Left;   PORTACATH PLACEMENT Right 11/15/2021   Procedure: INSERTION PORT-A-CATH;  Surgeon: Emelia Loron, MD;  Location: [facility];  Service: General;  Laterality: Right;   TONSILLECTOMY AND ADENOIDECTOMY  1954   Patient Active Problem List   Diagnosis Date Noted   Acute deep vein thrombosis (DVT) of left lower extremity 04/23/2022   Port-A-Cath in place 02/04/2022   Genetic testing 12/07/2021   Malignant neoplasm of upper-inner quadrant of left breast in female, estrogen receptor positive 10/19/2021   Piriformis syndrome of left side 07/12/2015   Degenerative arthritis of left knee 07/12/2015   BEE STING REACTION, LOCAL 10/24/2006    PCP: [name] NP   REFERRING PROVIDER: Rachel Moulds, MD   REFERRING DIAG:  C50.212,Z17.0 (ICD-10-CM) - Malignant neoplasm of upper-inner quadrant of left breast in female, estrogen receptor  positive    THERAPY DIAG:  Lymphedema, not elsewhere classified  Aftercare following surgery for neoplasm  Abnormal posture  Malignant neoplasm of upper-inner quadrant of left breast in female, estrogen receptor positive  ONSET DATE: Feb 2024  Rationale for Evaluation and Treatment: Rehabilitation  SUBJECTIVE:                                                                                                                                                                                           SUBJECTIVE STATEMENT: I wore the compression bra but the port  got very red on the right side so I had to un do to R strap and that helped. I think the L breast is softer.   PERTINENT HISTORY: Patient was diagnosed on 09/20/2021 with left grade 2 invasive ductal carcinoma breast cancer. She underwent a left lumpectomy and sentinel node biopsy (2 negative nodes) on 11/15/2021. 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 No  PRECAUTIONS: Other: L UE lymphedema risk, DVT (11/19/21)  WEIGHT BEARING RESTRICTIONS: No  FALLS:  Has patient fallen in last 6 months? No  LIVING ENVIRONMENT: Lives with: lives with their spouse Lives in: House/apartment Stairs: Yes; Internal: about 14 (has 2 sets)  steps; on left going up and External: 3 steps; none Has following equipment at home: None  OCCUPATION: retired  LEISURE: pt went to the gym today and began lifting 3 lb weights, she has been lifting 3 lbs at home, walks 30 min  HAND DOMINANCE: right   PRIOR LEVEL OF FUNCTION: Independent  PATIENT GOALS: to get the swelling down   OBJECTIVE:  COGNITION: Overall cognitive status: Within functional limits for tasks assessed   PALPATION: Fibrosis palpable in inferior breast  OBSERVATIONS / OTHER ASSESSMENTS: L breast about 30% larger than R, slightly darker in color post radiation, fibrosis present in  inferior breast  POSTURE: forward head, rounded shoulders  UPPER EXTREMITY AROM/PROM: WFL  UPPER EXTREMITY STRENGTH:   Surgery type/Date: Left lumpectomy with SLNB 11/15/2021  Number of lymph nodes removed: 0/2  Current/past treatment (chemo, radiation, hormone therapy): Completed chemo and radiation. Infusions of Herceptin for a year   LYMPHEDEMA ASSESSMENTS:   LANDMARK RIGHT  eval  At axilla    15 cm proximal to olecranon process 24.7  10 cm proximal to olecranon process 23  Olecranon process 21.5  15 cm proximal to ulnar styloid process 22  10 cm proximal to ulnar styloid process 19  Just proximal to ulnar styloid process 14.5  Across hand at thumb web space 18.3  At base of 2nd digit 5.8  (Blank rows = not tested)  LANDMARK LEFT  eval  At axilla    15 cm proximal to olecranon process 24  10 cm proximal to olecranon process 22.4  Olecranon process 21.5  15 cm proximal to ulnar styloid process 21.4  10 cm proximal to ulnar styloid process 18.6  Just proximal to ulnar styloid process 14.1  Across hand at thumb web space 17.5  At base of 2nd digit 5.6  (Blank rows = not tested)   BREAST COMPLAINTS: 35   TODAY'S TREATMENT:                                                                                                                                          DATE:  07/04/22: In supine: Short  neck, Rt axilla and Lt inguinal nodes, superficial and deep abdominals, anterior inter-axillary and Lt axillo-inguinal anastomosis, and Lt breast focusing on areas of swelling redirecting towards anastomosis. Educated pt throughout on each step, anatomy and physiology of the lymphatic system, importance of proper skin stretch and sequence.   07/01/22: cut 1/2 inch grey foam for pt to wear in her bra for additional compression in inferior portion with fibrosis    PATIENT EDUCATION:  Education details: anatomy and physiology of the lymphatic system, how to manage  lymphedema Person educated: Patient Education method: Explanation Education comprehension: verbalized understanding  HOME EXERCISE PROGRAM: Wear compression bra during waking hours  ASSESSMENT:  CLINICAL IMPRESSION: Began MLD to L breast today. Educated pt to wear the foam over the area where her bra was indenting her breast. Will instruct pt and have her return demonstrate correct technique at next session.    OBJECTIVE IMPAIRMENTS: decreased knowledge of condition, decreased knowledge of use of DME, increased edema, postural dysfunction, and pain.   ACTIVITY LIMITATIONS:  none  PARTICIPATION LIMITATIONS:  none  PERSONAL FACTORS:  none  are also affecting patient's functional outcome.   REHAB POTENTIAL: Good  CLINICAL DECISION MAKING: Stable/uncomplicated  EVALUATION COMPLEXITY: Low  GOALS: Goals reviewed with patient? Yes  SHORT TERM GOALS=LONG TERM GOALS Target date: 07/29/22  Pt will be independent in self MLD for long term management of lymphedema. Baseline: Goal status: INITIAL  2.  Pt will be able to independently manage her lymphedema with self MLD and her compression bra.  Baseline:  Goal status: INITIAL  3.  Pt will report a 50% improvement in feeling of discomfort and heaviness in L breast to allow improved comfort.  Baseline:  Goal status: INITIAL    PLAN:  PT FREQUENCY: 2x/week  PT DURATION: 4 weeks  PLANNED INTERVENTIONS: Therapeutic exercises, Therapeutic activity, Patient/Family education, Self Care, Orthotic/Fit training, Manual lymph drainage, Compression bandaging, scar mobilization, Vasopneumatic device, and Manual therapy  PLAN FOR NEXT SESSION: cont MLD to L breast and instruct pt, how was foam, how does her compression bra fit  [employer], PT 07/04/2022, 4:02 PM

## 2022-07-09 ENCOUNTER — Encounter: Payer: Self-pay | Admitting: Physical Therapy

## 2022-07-09 ENCOUNTER — Ambulatory Visit: Payer: Medicare HMO | Admitting: Physical Therapy

## 2022-07-09 DIAGNOSIS — Z483 Aftercare following surgery for neoplasm: Secondary | ICD-10-CM | POA: Diagnosis not present

## 2022-07-09 DIAGNOSIS — C50212 Malignant neoplasm of upper-inner quadrant of left female breast: Secondary | ICD-10-CM | POA: Diagnosis not present

## 2022-07-09 DIAGNOSIS — I890 Lymphedema, not elsewhere classified: Secondary | ICD-10-CM | POA: Diagnosis not present

## 2022-07-09 DIAGNOSIS — Z170 Estrogen receptor positive status [ER+]: Secondary | ICD-10-CM | POA: Diagnosis not present

## 2022-07-09 DIAGNOSIS — R293 Abnormal posture: Secondary | ICD-10-CM | POA: Diagnosis not present

## 2022-07-09 NOTE — Patient Instructions (Signed)
Self manual lymph drainage: Perform this sequence once a day.  Only give enough pressure no your skin to make the skin move.  Diaphragmatic - Supine   Inhale through nose making navel move out toward hands. Exhale through puckered lips, hands follow navel in. Repeat _5__ times. Rest _10__ seconds between repeats.   Copyright  VHI. All rights reserved.  Hug yourself.  Do circles at your neck just above your collarbones.  Repeat this 10 times.  Axilla - One at a Time   Using full weight of flat hand and fingers at center of uninvolved armpit, make _10__ in-place circles.   Copyright  VHI. All rights reserved.  LEG: Inguinal Nodes Stimulation   With small finger side of hand against hip crease on involved side, gently perform circles at the crease. Repeat __10_ times.   Copyright  VHI. All rights reserved.  Axilla to Inguinal Nodes - Sweep   On involved side, stretch_4__ times from armpit along side of trunk to hip crease.  Now gently stretch skin from the involved side to the uninvolved side across the chest at the shoulder line.  Repeat that 4 times.  Draw an imaginary diagonal line from upper outer breast through the nipple area toward lower inner breast.  Direct fluid upward and inward from this line toward the pathway across your upper chest .  Do this in three rows to treat all of the upper inner breast tissue, and do each row 3-4x.      Direct fluid to treat all of lower outer breast tissue downward and outward toward      pathway that is aimed at the left groin.  Finish by doing the pathways as described above going from your involved armpit to the same side groin and going across your upper chest from the involved shoulder to the uninvolved shoulder.  Repeat the steps above where you do circles in your left groin and right armpit. Copyright  VHI. All rights reserved.

## 2022-07-09 NOTE — Therapy (Signed)
OUTPATIENT PHYSICAL THERAPY  UPPER EXTREMITY ONCOLOGY TREATMENT  Patient Name: Patricia Marshall MRN: [id number] DOB:[birth year], 77 y.o., female Today's Date: 07/09/2022  END OF SESSION:  PT End of Session - 07/09/22 1559     Visit Number 3    Number of Visits 9    Date for PT Re-Evaluation 07/29/22    PT Start Time 1503    PT Stop Time 1558    PT Time Calculation (min) 55 min    Activity Tolerance Patient tolerated treatment well    Behavior During Therapy WFL for tasks assessed/performed              Past Medical History:  Diagnosis Date   Arthritis    Breast cancer    Chicken pox    High cholesterol    PONV (postoperative nausea and vomiting)    Vitamin D deficiency    Past Surgical History:  Procedure Laterality Date   APPENDECTOMY  1959   BACK SURGERY     1996   BREAST LUMPECTOMY WITH AXILLARY LYMPH NODE BIOPSY Left 11/15/2021   Procedure: LEFT BREAST LUMPECTOMY WITH AXILLARY SENTINEL LYMPH NODE BIOPSY;  Surgeon: Emelia Loron, MD;  Location: [facility];  Service: General;  Laterality: Left;   PORTACATH PLACEMENT Right 11/15/2021   Procedure: INSERTION PORT-A-CATH;  Surgeon: Emelia Loron, MD;  Location: [facility];  Service: General;  Laterality: Right;   TONSILLECTOMY AND ADENOIDECTOMY  1954   Patient Active Problem List   Diagnosis Date Noted   Acute deep vein thrombosis (DVT) of left lower extremity 04/23/2022   Port-A-Cath in place 02/04/2022   Genetic testing 12/07/2021   Malignant neoplasm of upper-inner quadrant of left breast in female, estrogen receptor positive 10/19/2021   Piriformis syndrome of left side 07/12/2015   Degenerative arthritis of left knee 07/12/2015   BEE STING REACTION, LOCAL 10/24/2006    PCP: [name] NP   REFERRING PROVIDER: Rachel Moulds, MD   REFERRING DIAG:  C50.212,Z17.0 (ICD-10-CM) - Malignant neoplasm of upper-inner quadrant of left breast in female, estrogen receptor  positive    THERAPY DIAG:  Lymphedema, not elsewhere classified  Aftercare following surgery for neoplasm  Abnormal posture  Malignant neoplasm of upper-inner quadrant of left breast in female, estrogen receptor positive  ONSET DATE: Feb 2024  Rationale for Evaluation and Treatment: Rehabilitation  SUBJECTIVE:                                                                                                                                                                                           SUBJECTIVE STATEMENT: Is there an alternative to the compression  bra. I have pain in my chest when I wear that bra. It radiates in my R chest and is relieved when I take it off.   PERTINENT HISTORY: Patient was diagnosed on 09/20/2021 with left grade 2 invasive ductal carcinoma breast cancer. She underwent a left lumpectomy and sentinel node biopsy (2 negative nodes) on 11/15/2021. 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 No  PRECAUTIONS: Other: L UE lymphedema risk, DVT (11/19/21)  WEIGHT BEARING RESTRICTIONS: No  FALLS:  Has patient fallen in last 6 months? No  LIVING ENVIRONMENT: Lives with: lives with their spouse Lives in: House/apartment Stairs: Yes; Internal: about 14 (has 2 sets)  steps; on left going up and External: 3 steps; none Has following equipment at home: None  OCCUPATION: retired  LEISURE: pt went to the gym today and began lifting 3 lb weights, she has been lifting 3 lbs at home, walks 30 min  HAND DOMINANCE: right   PRIOR LEVEL OF FUNCTION: Independent  PATIENT GOALS: to get the swelling down   OBJECTIVE:  COGNITION: Overall cognitive status: Within functional limits for tasks assessed   PALPATION: Fibrosis palpable in inferior breast  OBSERVATIONS / OTHER ASSESSMENTS: L breast about 30% larger than R, slightly darker in color post radiation, fibrosis  present in inferior breast  POSTURE: forward head, rounded shoulders  UPPER EXTREMITY AROM/PROM: WFL  UPPER EXTREMITY STRENGTH:   Surgery type/Date: Left lumpectomy with SLNB 11/15/2021  Number of lymph nodes removed: 0/2  Current/past treatment (chemo, radiation, hormone therapy): Completed chemo and radiation. Infusions of Herceptin for a year   LYMPHEDEMA ASSESSMENTS:   LANDMARK RIGHT  eval  At axilla    15 cm proximal to olecranon process 24.7  10 cm proximal to olecranon process 23  Olecranon process 21.5  15 cm proximal to ulnar styloid process 22  10 cm proximal to ulnar styloid process 19  Just proximal to ulnar styloid process 14.5  Across hand at thumb web space 18.3  At base of 2nd digit 5.8  (Blank rows = not tested)  LANDMARK LEFT  eval  At axilla    15 cm proximal to olecranon process 24  10 cm proximal to olecranon process 22.4  Olecranon process 21.5  15 cm proximal to ulnar styloid process 21.4  10 cm proximal to ulnar styloid process 18.6  Just proximal to ulnar styloid process 14.1  Across hand at thumb web space 17.5  At base of 2nd digit 5.6  (Blank rows = not tested)   BREAST COMPLAINTS: 35   TODAY'S TREATMENT:                                                                                                                                          DATE:  07/09/22: In supine: Short  neck, 5 diaphragmatic breaths, R axillary nodes and establishment of interaxillary pathway, L inguinal nodes and establishment of axilloinguinal pathway, then L breast moving fluid towards pathways spending extra time in any areas of fibrosis then retracing all steps. Educated pt throughout and had her return demonstrate each step up until the breast itself. Pt required v/c for proper skin stretch technique. Educated pt about availability of different compression bras that may work better for her since she can not tolerate her current compression bra.   07/04/22: In  supine: Short neck, Rt axilla and Lt inguinal nodes, superficial and deep abdominals, anterior inter-axillary and Lt axillo-inguinal anastomosis, and Lt breast focusing on areas of swelling redirecting towards anastomosis. Educated pt throughout on each step, anatomy and physiology of the lymphatic system, importance of proper skin stretch and sequence.   07/01/22: cut 1/2 inch grey foam for pt to wear in her bra for additional compression in inferior portion with fibrosis    PATIENT EDUCATION:  Education details: anatomy and physiology of the lymphatic system, how to manage lymphedema Person educated: Patient Education method: Explanation Education comprehension: verbalized understanding  HOME EXERCISE PROGRAM: Wear compression bra during waking hours  ASSESSMENT:  CLINICAL IMPRESSION: Continued MLD to L breast. Began to instruct pt in self MLD technique and had her return demonstrate correct skin stretch and beginning of sequence. Issued handout for pt to follow at home to begin to practice. Educated pt to make an appointment at [facility] to be fit for a different compression bra. Her current [employer] is too tight and causes increased pain.    OBJECTIVE IMPAIRMENTS: decreased knowledge of condition, decreased knowledge of use of DME, increased edema, postural dysfunction, and pain.   ACTIVITY LIMITATIONS:  none  PARTICIPATION LIMITATIONS:  none  PERSONAL FACTORS:  none  are also affecting patient's functional outcome.   REHAB POTENTIAL: Good  CLINICAL DECISION MAKING: Stable/uncomplicated  EVALUATION COMPLEXITY: Low  GOALS: Goals reviewed with patient? Yes  SHORT TERM GOALS=LONG TERM GOALS Target date: 07/29/22  Pt will be independent in self MLD for long term management of lymphedema. Baseline: Goal status: INITIAL  2.  Pt will be able to independently manage her lymphedema with self MLD and her compression bra.  Baseline:  Goal status: INITIAL  3.  Pt  will report a 50% improvement in feeling of discomfort and heaviness in L breast to allow improved comfort.  Baseline:  Goal status: INITIAL    PLAN:  PT FREQUENCY: 2x/week  PT DURATION: 4 weeks  PLANNED INTERVENTIONS: Therapeutic exercises, Therapeutic activity, Patient/Family education, Self Care, Orthotic/Fit training, Manual lymph drainage, Compression bandaging, scar mobilization, Vasopneumatic device, and Manual therapy  PLAN FOR NEXT SESSION: cont MLD to L breast and instruct pt, how was foam, how does her compression bra fit  [employer], PT 07/09/2022, 4:03 PM

## 2022-07-10 ENCOUNTER — Encounter: Payer: Self-pay | Admitting: Physical Therapy

## 2022-07-10 ENCOUNTER — Ambulatory Visit: Payer: Medicare HMO | Admitting: Physical Therapy

## 2022-07-10 DIAGNOSIS — Z170 Estrogen receptor positive status [ER+]: Secondary | ICD-10-CM | POA: Diagnosis not present

## 2022-07-10 DIAGNOSIS — R293 Abnormal posture: Secondary | ICD-10-CM | POA: Diagnosis not present

## 2022-07-10 DIAGNOSIS — Z483 Aftercare following surgery for neoplasm: Secondary | ICD-10-CM | POA: Diagnosis not present

## 2022-07-10 DIAGNOSIS — I890 Lymphedema, not elsewhere classified: Secondary | ICD-10-CM

## 2022-07-10 DIAGNOSIS — C50212 Malignant neoplasm of upper-inner quadrant of left female breast: Secondary | ICD-10-CM | POA: Diagnosis not present

## 2022-07-10 NOTE — Therapy (Signed)
OUTPATIENT PHYSICAL THERAPY  UPPER EXTREMITY ONCOLOGY TREATMENT  Patient Name: Patricia Marshall MRN: [id number] DOB:Oct 10, 1945, 77 y.o., female Today's Date: 07/10/2022  END OF SESSION:  PT End of Session - 07/10/22 1404     Visit Number 4    Number of Visits 9    Date for PT Re-Evaluation 07/29/22    PT Start Time 1403    PT Stop Time 1453    PT Time Calculation (min) 50 min    Activity Tolerance Patient tolerated treatment well    Behavior During Therapy WFL for tasks assessed/performed              Past Medical History:  Diagnosis Date   Arthritis    Breast cancer    Chicken pox    High cholesterol    PONV (postoperative nausea and vomiting)    Vitamin D deficiency    Past Surgical History:  Procedure Laterality Date   APPENDECTOMY  1959   BACK SURGERY     1996   BREAST LUMPECTOMY WITH AXILLARY LYMPH NODE BIOPSY Left 11/15/2021   Procedure: LEFT BREAST LUMPECTOMY WITH AXILLARY SENTINEL LYMPH NODE BIOPSY;  Surgeon: Emelia Loron, MD;  Location: [facility];  Service: General;  Laterality: Left;   PORTACATH PLACEMENT Right 11/15/2021   Procedure: INSERTION PORT-A-CATH;  Surgeon: Emelia Loron, MD;  Location: [facility];  Service: General;  Laterality: Right;   TONSILLECTOMY AND ADENOIDECTOMY  1954   Patient Active Problem List   Diagnosis Date Noted   Acute deep vein thrombosis (DVT) of left lower extremity 04/23/2022   Port-A-Cath in place 02/04/2022   Genetic testing 12/07/2021   Malignant neoplasm of upper-inner quadrant of left breast in female, estrogen receptor positive 10/19/2021   Piriformis syndrome of left side 07/12/2015   Degenerative arthritis of left knee 07/12/2015   BEE STING REACTION, LOCAL 10/24/2006    PCP: [name] NP   REFERRING PROVIDER: Rachel Moulds, MD   REFERRING DIAG:  C50.212,Z17.0 (ICD-10-CM) - Malignant neoplasm of upper-inner quadrant of left breast in female, estrogen receptor  positive    THERAPY DIAG:  Lymphedema, not elsewhere classified  Aftercare following surgery for neoplasm  Abnormal posture  Malignant neoplasm of upper-inner quadrant of left breast in female, estrogen receptor positive  ONSET DATE: Feb 2024  Rationale for Evaluation and Treatment: Rehabilitation  SUBJECTIVE:                                                                                                                                                                                           SUBJECTIVE STATEMENT: I got more familiar with the massage.  I cut the foam down to something smaller. The material over the foam was scratchy. I found some material to go over the foam that is not itchy. I have had the compression bra on since 1030 this morning and it has been 3.5 hrs without pain.   PERTINENT HISTORY: Patient was diagnosed on 09/20/2021 with left grade 2 invasive ductal carcinoma breast cancer. She underwent a left lumpectomy and sentinel node biopsy (2 negative nodes) on 11/15/2021. 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 No  PRECAUTIONS: Other: L UE lymphedema risk, DVT (11/19/21)  WEIGHT BEARING RESTRICTIONS: No  FALLS:  Has patient fallen in last 6 months? No  LIVING ENVIRONMENT: Lives with: lives with their spouse Lives in: House/apartment Stairs: Yes; Internal: about 14 (has 2 sets)  steps; on left going up and External: 3 steps; none Has following equipment at home: None  OCCUPATION: retired  LEISURE: pt went to the gym today and began lifting 3 lb weights, she has been lifting 3 lbs at home, walks 30 min  HAND DOMINANCE: right   PRIOR LEVEL OF FUNCTION: Independent  PATIENT GOALS: to get the swelling down   OBJECTIVE:  COGNITION: Overall cognitive status: Within functional limits for tasks assessed   PALPATION: Fibrosis palpable in inferior  breast  OBSERVATIONS / OTHER ASSESSMENTS: L breast about 30% larger than R, slightly darker in color post radiation, fibrosis present in inferior breast  POSTURE: forward head, rounded shoulders  UPPER EXTREMITY AROM/PROM: WFL  UPPER EXTREMITY STRENGTH:   Surgery type/Date: Left lumpectomy with SLNB 11/15/2021  Number of lymph nodes removed: 0/2  Current/past treatment (chemo, radiation, hormone therapy): Completed chemo and radiation. Infusions of Herceptin for a year   LYMPHEDEMA ASSESSMENTS:   LANDMARK RIGHT  eval  At axilla    15 cm proximal to olecranon process 24.7  10 cm proximal to olecranon process 23  Olecranon process 21.5  15 cm proximal to ulnar styloid process 22  10 cm proximal to ulnar styloid process 19  Just proximal to ulnar styloid process 14.5  Across hand at thumb web space 18.3  At base of 2nd digit 5.8  (Blank rows = not tested)  LANDMARK LEFT  eval  At axilla    15 cm proximal to olecranon process 24  10 cm proximal to olecranon process 22.4  Olecranon process 21.5  15 cm proximal to ulnar styloid process 21.4  10 cm proximal to ulnar styloid process 18.6  Just proximal to ulnar styloid process 14.1  Across hand at thumb web space 17.5  At base of 2nd digit 5.6  (Blank rows = not tested)   BREAST COMPLAINTS: 35   TODAY'S TREATMENT:  DATE:  07/10/22: Seated in front of mirror for first half (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 Educated pt throughout and had her return demonstrate each step. Pt was very  familiar with the sequence today and required much less cueing for the pathways. She still required mod v/c for the breast.    07/09/22: In supine: Short neck, 5 diaphragmatic breaths, R axillary nodes and establishment of interaxillary pathway, L inguinal nodes and establishment of axilloinguinal pathway, then L breast moving fluid towards pathways spending extra time in any areas of fibrosis then retracing all steps. Educated pt throughout and had her return demonstrate each step up until the breast itself. Pt required v/c for proper skin stretch technique. Educated pt about availability of different compression bras that may work better for her since she can not tolerate her current compression bra.   07/04/22: In supine: Short neck, Rt axilla and Lt inguinal nodes, superficial and deep abdominals, anterior inter-axillary and Lt axillo-inguinal anastomosis, and Lt breast focusing on areas of swelling redirecting towards anastomosis. Educated pt throughout on each step, anatomy and physiology of the lymphatic system, importance of proper skin stretch and sequence.   07/01/22: cut 1/2 inch grey foam for pt to wear in her bra for additional compression in inferior portion with fibrosis    PATIENT EDUCATION:  Education details: anatomy and physiology of the lymphatic system, how to manage lymphedema Person educated: Patient Education method: Explanation Education comprehension: verbalized understanding  HOME EXERCISE PROGRAM: Wear compression bra during waking hours  ASSESSMENT:  CLINICAL IMPRESSION: Pt is demonstrating improving independence with self massage. She is requiring less cueing for proper skin stretch and for the sequence. She was instructed in breast MLD today and needed mod v/c for correct skin stretch in this area. She did well seated in front of the mirror so educated her that she can lie down at home and use a hand held mirror to see if this helps. She also tried her compression  bra again and placed a smaller foam piece in a silky material to put in her compression bra and this has been more tolerable for her.    OBJECTIVE IMPAIRMENTS: decreased knowledge of condition, decreased knowledge of use of DME, increased edema, postural dysfunction, and pain.   ACTIVITY LIMITATIONS:  none  PARTICIPATION LIMITATIONS:  none  PERSONAL FACTORS:  none  are also affecting patient's functional outcome.   REHAB POTENTIAL: Good  CLINICAL DECISION MAKING: Stable/uncomplicated  EVALUATION COMPLEXITY: Low  GOALS: Goals reviewed with patient? Yes  SHORT TERM GOALS=LONG TERM GOALS Target date: 07/29/22  Pt will be independent in self MLD for long term management of lymphedema. Baseline: Goal status: INITIAL  2.  Pt will be able to independently manage her lymphedema with self MLD and her compression bra.  Baseline:  Goal status: INITIAL  3.  Pt will report a 50% improvement in feeling of discomfort and heaviness in L breast to allow improved comfort.  Baseline:  Goal status: INITIAL    PLAN:  PT FREQUENCY: 2x/week  PT DURATION: 4 weeks  PLANNED INTERVENTIONS: Therapeutic exercises, Therapeutic activity, Patient/Family education, Self Care, Orthotic/Fit training, Manual lymph drainage, Compression bandaging, scar mobilization, Vasopneumatic device, and Manual therapy  PLAN FOR  NEXT SESSION: cont MLD to L breast and instruct pt, how was foam, how does her compression bra fit  [employer], PT 07/10/2022, 3:00 PM

## 2022-07-15 ENCOUNTER — Encounter: Payer: Self-pay | Admitting: Physical Therapy

## 2022-07-15 ENCOUNTER — Ambulatory Visit: Payer: Medicare HMO | Admitting: Physical Therapy

## 2022-07-15 DIAGNOSIS — C50212 Malignant neoplasm of upper-inner quadrant of left female breast: Secondary | ICD-10-CM | POA: Diagnosis not present

## 2022-07-15 DIAGNOSIS — I890 Lymphedema, not elsewhere classified: Secondary | ICD-10-CM

## 2022-07-15 DIAGNOSIS — Z170 Estrogen receptor positive status [ER+]: Secondary | ICD-10-CM

## 2022-07-15 DIAGNOSIS — Z483 Aftercare following surgery for neoplasm: Secondary | ICD-10-CM

## 2022-07-15 DIAGNOSIS — R293 Abnormal posture: Secondary | ICD-10-CM | POA: Diagnosis not present

## 2022-07-15 NOTE — Therapy (Signed)
OUTPATIENT PHYSICAL THERAPY  UPPER EXTREMITY ONCOLOGY TREATMENT  Patient Name: Patricia Marshall MRN: [id number] DOB:10/24/1945, 77 y.o., female Today's Date: 07/15/2022  END OF SESSION:  PT End of Session - 07/15/22 1507     Visit Number 5    Number of Visits 9    Date for PT Re-Evaluation 07/29/22    PT Start Time 1506    PT Stop Time 1600    PT Time Calculation (min) 54 min    Activity Tolerance Patient tolerated treatment well    Behavior During Therapy WFL for tasks assessed/performed              Past Medical History:  Diagnosis Date   Arthritis    Breast cancer (HCC)    Chicken pox    High cholesterol    PONV (postoperative nausea and vomiting)    Vitamin D deficiency    Past Surgical History:  Procedure Laterality Date   APPENDECTOMY  1959   BACK SURGERY     1996   BREAST LUMPECTOMY WITH AXILLARY LYMPH NODE BIOPSY Left 11/15/2021   Procedure: LEFT BREAST LUMPECTOMY WITH AXILLARY SENTINEL LYMPH NODE BIOPSY;  Surgeon: Emelia Loron, MD;  Location: [facility];  Service: General;  Laterality: Left;   PORTACATH PLACEMENT Right 11/15/2021   Procedure: INSERTION PORT-A-CATH;  Surgeon: Emelia Loron, MD;  Location: [facility];  Service: General;  Laterality: Right;   TONSILLECTOMY AND ADENOIDECTOMY  1954   Patient Active Problem List   Diagnosis Date Noted   Acute deep vein thrombosis (DVT) of left lower extremity (HCC) 04/23/2022   Port-A-Cath in place 02/04/2022   Genetic testing 12/07/2021   Malignant neoplasm of upper-inner quadrant of left breast in female, estrogen receptor positive (HCC) 10/19/2021   Piriformis syndrome of left side 07/12/2015   Degenerative arthritis of left knee 07/12/2015   BEE STING REACTION, LOCAL 10/24/2006    PCP: [name] NP   REFERRING PROVIDER: Rachel Moulds, MD   REFERRING DIAG:  C50.212,Z17.0 (ICD-10-CM) - Malignant neoplasm of upper-inner quadrant of left breast in female,  estrogen receptor positive    THERAPY DIAG:  Lymphedema, not elsewhere classified  Aftercare following surgery for neoplasm  Abnormal posture  Malignant neoplasm of upper-inner quadrant of left breast in female, estrogen receptor positive (HCC)  ONSET DATE: Feb 2024  Rationale for Evaluation and Treatment: Rehabilitation  SUBJECTIVE:                                                                                                                                                                                           SUBJECTIVE STATEMENT: I got more  familiar with the massage. I cut the foam down to something smaller. The material over the foam was scratchy. I found some material to go over the foam that is not itchy. I have had the compression bra on since 1030 this morning and it has been 3.5 hrs without pain.   PERTINENT HISTORY: Patient was diagnosed on 09/20/2021 with left grade 2 invasive ductal carcinoma breast cancer. She underwent a left lumpectomy and sentinel node biopsy (2 negative nodes) on 11/15/2021. 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 No  PRECAUTIONS: Other: L UE lymphedema risk, DVT (11/19/21)  WEIGHT BEARING RESTRICTIONS: No  FALLS:  Has patient fallen in last 6 months? No  LIVING ENVIRONMENT: Lives with: lives with their spouse Lives in: House/apartment Stairs: Yes; Internal: about 14 (has 2 sets)  steps; on left going up and External: 3 steps; none Has following equipment at home: None  OCCUPATION: retired  LEISURE: pt went to the gym today and began lifting 3 lb weights, she has been lifting 3 lbs at home, walks 30 min  HAND DOMINANCE: right   PRIOR LEVEL OF FUNCTION: Independent  PATIENT GOALS: to get the swelling down   OBJECTIVE:  COGNITION: Overall cognitive status: Within functional limits for tasks assessed   PALPATION: Fibrosis  palpable in inferior breast  OBSERVATIONS / OTHER ASSESSMENTS: L breast about 30% larger than R, slightly darker in color post radiation, fibrosis present in inferior breast  POSTURE: forward head, rounded shoulders  UPPER EXTREMITY AROM/PROM: WFL  UPPER EXTREMITY STRENGTH:   Surgery type/Date: Left lumpectomy with SLNB 11/15/2021  Number of lymph nodes removed: 0/2  Current/past treatment (chemo, radiation, hormone therapy): Completed chemo and radiation. Infusions of Herceptin for a year   LYMPHEDEMA ASSESSMENTS:   LANDMARK RIGHT  eval  At axilla    15 cm proximal to olecranon process 24.7  10 cm proximal to olecranon process 23  Olecranon process 21.5  15 cm proximal to ulnar styloid process 22  10 cm proximal to ulnar styloid process 19  Just proximal to ulnar styloid process 14.5  Across hand at thumb web space 18.3  At base of 2nd digit 5.8  (Blank rows = not tested)  LANDMARK LEFT  eval  At axilla    15 cm proximal to olecranon process 24  10 cm proximal to olecranon process 22.4  Olecranon process 21.5  15 cm proximal to ulnar styloid process 21.4  10 cm proximal to ulnar styloid process 18.6  Just proximal to ulnar styloid process 14.1  Across hand at thumb web space 17.5  At base of 2nd digit 5.6  (Blank rows = not tested)   BREAST COMPLAINTS: 35   TODAY'S TREATMENT:  DATE:  07/15/22: In supine: Short neck, 5 diaphragmatic breaths, R axillary nodes and establishment of interaxillary pathway, L inguinal nodes and establishment of axilloinguinal pathway, then L breast moving fluid towards pathways spending extra time in any areas of fibrosis then retracing all steps. Had pt return demonstrate steps again today and gave v/c to keep fingers together, use light pressure and go slowly. Issued rx for a new compression bra.    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 Educated pt throughout and had her return demonstrate each step. Pt was very familiar with the sequence today and required much less cueing for the pathways. She still required mod v/c for the breast.    07/09/22: In supine: Short neck, 5 diaphragmatic breaths, R axillary nodes and establishment of interaxillary pathway, L inguinal nodes and establishment of axilloinguinal pathway, then L breast moving fluid towards pathways spending extra time in any areas of fibrosis then retracing all steps. Educated pt throughout and had her return demonstrate each step up until the breast itself. Pt required v/c for proper skin stretch technique. Educated pt about availability of different compression bras that may work better for her since she can not tolerate her current compression bra.   07/04/22: In supine: Short neck, Rt axilla and Lt inguinal nodes, superficial and deep abdominals, anterior inter-axillary and Lt axillo-inguinal anastomosis, and Lt breast focusing on areas of swelling redirecting towards anastomosis. Educated pt throughout on each step, anatomy and physiology of the lymphatic system, importance of proper skin stretch and sequence.   07/01/22: cut 1/2 inch grey foam for pt to wear in her bra for additional compression in inferior portion with fibrosis    PATIENT EDUCATION:  Education details: anatomy and physiology of the lymphatic system, how to manage lymphedema Person educated: Patient Education method: Explanation Education comprehension: verbalized understanding  HOME EXERCISE PROGRAM: Wear  compression bra during waking hours  ASSESSMENT:  CLINICAL IMPRESSION: Pt continues to practice self MLD to her R breast. She is going to go by 2nd to [place] to see if they will cover her compression bra. She is going to try a different type. Her current type continues to leave deep indentations on her breast. Continued to educated pt in self MLD technique. Pt was a little dizzy after getting up at end of session. Pt was going to drink some water and sit in the waiting room for a bit. She states that sometimes her blood pressure drops after lying down for a while and then sitting up.     OBJECTIVE IMPAIRMENTS: decreased knowledge of condition, decreased knowledge of use of DME, increased edema, postural dysfunction, and pain.   ACTIVITY LIMITATIONS:  none  PARTICIPATION LIMITATIONS:  none  PERSONAL FACTORS:  none  are also affecting patient's functional outcome.   REHAB POTENTIAL: Good  CLINICAL DECISION MAKING: Stable/uncomplicated  EVALUATION COMPLEXITY: Low  GOALS: Goals reviewed with patient? Yes  SHORT TERM GOALS=LONG TERM GOALS Target date: 07/29/22  Pt will be independent in self MLD for long term management of lymphedema. Baseline: Goal status: INITIAL  2.  Pt will be able to independently manage her lymphedema with self MLD and her compression bra.  Baseline:  Goal status: INITIAL  3.  Pt will report a 50% improvement in feeling of discomfort and heaviness in L breast to allow improved comfort.  Baseline:  Goal status: INITIAL    PLAN:  PT FREQUENCY: 2x/week  PT DURATION: 4 weeks  PLANNED INTERVENTIONS: Therapeutic exercises, Therapeutic activity, Patient/Family education, Self Care, Orthotic/Fit training, Manual lymph drainage, Compression bandaging, scar mobilization, Vasopneumatic device, and Manual therapy  PLAN FOR NEXT SESSION: cont MLD to L breast and instruct pt, how was foam, how does her compression bra fit  [employer], PT 07/15/2022,  4:03 PM

## 2022-07-16 ENCOUNTER — Encounter: Payer: Self-pay | Admitting: Hematology and Oncology

## 2022-07-16 ENCOUNTER — Encounter: Payer: Self-pay | Admitting: Adult Health

## 2022-07-16 ENCOUNTER — Inpatient Hospital Stay (HOSPITAL_BASED_OUTPATIENT_CLINIC_OR_DEPARTMENT_OTHER): Payer: Medicare HMO | Admitting: Adult Health

## 2022-07-16 ENCOUNTER — Inpatient Hospital Stay: Payer: Medicare HMO

## 2022-07-16 ENCOUNTER — Other Ambulatory Visit: Payer: Self-pay

## 2022-07-16 VITALS — BP 138/63 | HR 59 | Temp 97.8°F | Resp 18 | Ht 65.0 in | Wt 125.8 lb

## 2022-07-16 DIAGNOSIS — R5383 Other fatigue: Secondary | ICD-10-CM | POA: Diagnosis not present

## 2022-07-16 DIAGNOSIS — Z79811 Long term (current) use of aromatase inhibitors: Secondary | ICD-10-CM | POA: Diagnosis not present

## 2022-07-16 DIAGNOSIS — Z170 Estrogen receptor positive status [ER+]: Secondary | ICD-10-CM | POA: Diagnosis not present

## 2022-07-16 DIAGNOSIS — Z95828 Presence of other vascular implants and grafts: Secondary | ICD-10-CM

## 2022-07-16 DIAGNOSIS — C50212 Malignant neoplasm of upper-inner quadrant of left female breast: Secondary | ICD-10-CM | POA: Diagnosis not present

## 2022-07-16 DIAGNOSIS — Z5112 Encounter for antineoplastic immunotherapy: Secondary | ICD-10-CM | POA: Diagnosis not present

## 2022-07-16 DIAGNOSIS — I890 Lymphedema, not elsewhere classified: Secondary | ICD-10-CM | POA: Diagnosis not present

## 2022-07-16 DIAGNOSIS — Z923 Personal history of irradiation: Secondary | ICD-10-CM | POA: Diagnosis not present

## 2022-07-16 DIAGNOSIS — D696 Thrombocytopenia, unspecified: Secondary | ICD-10-CM | POA: Diagnosis not present

## 2022-07-16 LAB — CBC WITH DIFFERENTIAL/PLATELET
Abs Immature Granulocytes: 0.01 10*3/uL (ref 0.00–0.07)
Basophils Absolute: 0 10*3/uL (ref 0.0–0.1)
Basophils Relative: 1 %
Eosinophils Absolute: 0 10*3/uL (ref 0.0–0.5)
Eosinophils Relative: 1 %
HCT: 35.4 % — ABNORMAL LOW (ref 36.0–46.0)
Hemoglobin: 11.9 g/dL — ABNORMAL LOW (ref 12.0–15.0)
Immature Granulocytes: 0 %
Lymphocytes Relative: 24 %
Lymphs Abs: 1.1 10*3/uL (ref 0.7–4.0)
MCH: 29.5 pg (ref 26.0–34.0)
MCHC: 33.6 g/dL (ref 30.0–36.0)
MCV: 87.6 fL (ref 80.0–100.0)
Monocytes Absolute: 0.4 10*3/uL (ref 0.1–1.0)
Monocytes Relative: 9 %
Neutro Abs: 3 10*3/uL (ref 1.7–7.7)
Neutrophils Relative %: 65 %
Platelets: 114 10*3/uL — ABNORMAL LOW (ref 150–400)
RBC: 4.04 MIL/uL (ref 3.87–5.11)
RDW: 12.5 % (ref 11.5–15.5)
WBC: 4.6 10*3/uL (ref 4.0–10.5)
nRBC: 0 % (ref 0.0–0.2)

## 2022-07-16 MED ORDER — ACETAMINOPHEN 325 MG PO TABS
650.0000 mg | ORAL_TABLET | Freq: Once | ORAL | Status: AC
  Administered 2022-07-16: 650 mg via ORAL
  Filled 2022-07-16: qty 2

## 2022-07-16 MED ORDER — SODIUM CHLORIDE 0.9% FLUSH
10.0000 mL | INTRAVENOUS | Status: DC | PRN
  Administered 2022-07-16: 10 mL

## 2022-07-16 MED ORDER — TRASTUZUMAB-ANNS CHEMO 150 MG IV SOLR
6.0000 mg/kg | Freq: Once | INTRAVENOUS | Status: AC
  Administered 2022-07-16: 336 mg via INTRAVENOUS
  Filled 2022-07-16: qty 16

## 2022-07-16 MED ORDER — SODIUM CHLORIDE 0.9% FLUSH
10.0000 mL | Freq: Once | INTRAVENOUS | Status: AC
  Administered 2022-07-16: 10 mL

## 2022-07-16 MED ORDER — HEPARIN SOD (PORK) LOCK FLUSH 100 UNIT/ML IV SOLN
500.0000 [IU] | Freq: Once | INTRAVENOUS | Status: AC | PRN
  Administered 2022-07-16: 500 [IU]

## 2022-07-16 MED ORDER — SODIUM CHLORIDE 0.9 % IV SOLN: Freq: Once | INTRAVENOUS | Status: AC

## 2022-07-16 MED ORDER — LORATADINE 10 MG PO TABS
10.0000 mg | ORAL_TABLET | Freq: Every day | ORAL | Status: DC
  Administered 2022-07-16: 10 mg via ORAL
  Filled 2022-07-16: qty 1

## 2022-07-16 NOTE — Assessment & Plan Note (Signed)
Patricia Marshall is a 78 year old woman with history of stage IIa ER positive HER2 positive left-sided invasive ductal carcinoma diagnosed in July 2023 status post lumpectomy followed by adjuvant chemotherapy, maintenance Herceptin, adjuvant radiation, with antiestrogen therapy with Anastrozole.    Patricia Marshall continues on Herceptin and anastrozole.  She is tolerating both of these well.  She will proceed with echocardiogram in May.  Her labs indicate a mild thrombocytopenia.  We will continue to monitor this.  It could be a slowly resolving side effect from her previous chemotherapy.  Her biggest issue is this left breast lymphedema.  She understandably needs new compression bras and her physical therapist who is treating her has written scripts for this.  She will let us know if there are any insurance forms or letter as we need to write to her insurance company to cover these as they are tremendously needed for her breast lymphedema to be able to improve.  We will see Patricia Marshall back in 3 weeks for labs, follow-up, and her next infusion.

## 2022-07-16 NOTE — Patient Instructions (Signed)

## 2022-07-16 NOTE — Patient Instructions (Signed)
[facility]  Discharge Instructions: Thank you for choosing [facility] to provide your oncology and hematology care.   If you have a lab appointment with the Cancer Center, please go directly to the Cancer Center and check in at the registration area.   Wear comfortable clothing and clothing appropriate for easy access to any Portacath or PICC line.   We strive to give you quality time with your provider. You may need to reschedule your appointment if you arrive late (15 or more minutes).  Arriving late affects you and other patients whose appointments are after yours.  Also, if you miss three or more appointments without notifying the office, you may be dismissed from the clinic at the provider's discretion.      For prescription refill requests, have your pharmacy contact our office and allow 72 hours for refills to be completed.    Today you received the following chemotherapy and/or immunotherapy agents: Trastuzumab      To help prevent nausea and vomiting after your treatment, we encourage you to take your nausea medication as directed.  BELOW ARE SYMPTOMS THAT SHOULD BE REPORTED IMMEDIATELY: *FEVER GREATER THAN 100.4 F (38 C) OR HIGHER *CHILLS OR SWEATING *NAUSEA AND VOMITING THAT IS NOT CONTROLLED WITH YOUR NAUSEA MEDICATION *UNUSUAL SHORTNESS OF BREATH *UNUSUAL BRUISING OR BLEEDING *URINARY PROBLEMS (pain or burning when urinating, or frequent urination) *BOWEL PROBLEMS (unusual diarrhea, constipation, pain near the anus) TENDERNESS IN MOUTH AND THROAT WITH OR WITHOUT PRESENCE OF ULCERS (sore throat, sores in mouth, or a toothache) UNUSUAL RASH, SWELLING OR PAIN  UNUSUAL VAGINAL DISCHARGE OR ITCHING   Items with * indicate a potential emergency and should be followed up as soon as possible or go to the Emergency Department if any problems should occur.  Please show the CHEMOTHERAPY ALERT CARD or IMMUNOTHERAPY ALERT CARD at  check-in to the Emergency Department and triage nurse.  Should you have questions after your visit or need to cancel or reschedule your appointment, please contact [facility]  Dept: [phone]  and follow the prompts.  Office hours are 8:00 a.m. to 4:30 p.m. Monday - Friday. Please note that voicemails left after 4:00 p.m. may not be returned until the following business day.  We are closed weekends and major holidays. You have access to a nurse at all times for urgent questions. Please call the main number to the clinic Dept: [phone] and follow the prompts.   For any non-urgent questions, you may also contact your provider using MyChart. We now offer e-Visits for anyone 18 and older to request care online for non-urgent symptoms. For details visit [email or web address].   Also download the MyChart app! Go to the app store, search "MyChart", open the app, select [facility], and log in with your MyChart username and password.  

## 2022-07-16 NOTE — Progress Notes (Signed)
[facility] Cancer Follow up:    [name], NP [address] [place] [address]   DIAGNOSIS:  Cancer Staging  Malignant neoplasm of upper-inner quadrant of left breast in female, estrogen receptor positive (HCC) Staging form: Breast, AJCC 8th Edition - Pathologic: Stage IIA (pT2, pN0, cM0, G3, ER+, PR-, HER2+) - Signed by Rachel Moulds, MD on 11/26/2021 Histologic grading system: 3 grade system   SUMMARY OF ONCOLOGIC HISTORY: Oncology History  Malignant neoplasm of upper-inner quadrant of left breast in female, estrogen receptor positive (HCC)  09/20/2021 Mammogram   Screening mammogram showed extremely dense breasts and a possible mass in the left breast warranting further evaluation.  Breast density category is D Diagnostic mammogram of the breast showed indeterminate mass in the 11:00 location of the left breast.  Indeterminate mass in the lower anterior left axilla.   10/04/2021 Pathology Results   Pathology showed grade 2 invasive ductal carcinoma, left axillary soft tissue needle core biopsy showed benign breast tissue with patchy changes prognostics from the breast tumor showed ER 90% positive strong staining PR 0%, negative, Ki-67 of 30% and HER2 positive for IHC 3+   10/19/2021 Initial Diagnosis   Malignant neoplasm of upper-inner quadrant of left breast in female, estrogen receptor positive (HCC)   10/23/2021 Cancer Staging   Staging form: Breast, AJCC 8th Edition - Pathologic: Stage IIA (pT2, pN0, cM0, G3, ER+, PR-, HER2+) - Signed by Rachel Moulds, MD on 11/26/2021 Histologic grading system: 3 grade system   11/10/2021 Genetic Testing   Negative hereditary cancer genetic testing: no pathogenic variants detected in [facility] CancerNext-Expanded +RNA Panel.  Report date is November 10, 2021.   The CancerNext-Expanded gene panel offered by [facility] and includes sequencing, rearrangement, and RNA analysis for the following 77 genes: AIP, ALK, APC,  ATM, AXIN2, BAP1, BARD1, BLM, BMPR1A, BRCA1, BRCA2, BRIP1, CDC73, CDH1, CDK4, CDKN1B, CDKN2A, CHEK2, CTNNA1, DICER1, FANCC, FH, FLCN, GALNT12, KIF1B, LZTR1, MAX, MEN1, MET, MLH1, MSH2, MSH3, MSH6, MUTYH, NBN, NF1, NF2, NTHL1, PALB2, PHOX2B, PMS2, POT1, PRKAR1A, PTCH1, PTEN, RAD51C, RAD51D, RB1, RECQL, RET, SDHA, SDHAF2, SDHB, SDHC, SDHD, SMAD4, SMARCA4, SMARCB1, SMARCE1, STK11, SUFU, TMEM127, TP53, TSC1, TSC2, VHL and XRCC2 (sequencing and deletion/duplication); EGFR, EGLN1, HOXB13, KIT, MITF, PDGFRA, POLD1, and POLE (sequencing only); EPCAM and GREM1 (deletion/duplication only).    11/15/2021 Surgery   Left breast lumpectomy: 2.4 cm grade 3 invasive ductal carcinoma, margins -2 sentinel lymph nodes negative.   12/10/2021 -  Chemotherapy   Patient is on Treatment Plan : BREAST Paclitaxel + Trastuzumab q7d / Trastuzumab q21d     03/26/2022 - 04/22/2022 Radiation Therapy   03/26/2022 through 04/22/2022 Site Technique Total Dose (Gy) Dose per Fx (Gy) Completed Fx Beam Energies  Breast, Left: Breast_L 3D 42.56/42.56 2.66 16/16 6X  Breast, Left: Breast_L_Bst 3D 8/8 2 4/4 6X      CURRENT THERAPY: Herceptin/anastrozole  INTERVAL HISTORY: Alleen Borne 77 y.o. female returns for follow-up of her left-sided breast cancer currently on treatment with Herceptin.  She is tolerating this well.  Her most recent echocardiogram occurred on April 29, 2022 demonstrating a left ventricular ejection fraction of 60 to 65%.  Her next echocardiogram is scheduled on Jul 31, 2022.  She also underwent bone density testing on May 20, 2022 that demonstrated osteopenia with a T-score of -2.3 in the right femur.  Jamaris continues on anastrozole daily with good tolerance.  Biggest issue Lajoyce has had since her last visit with [place] has been breast lymphedema.  She is seeing physical therapy.  Her main concern around this is that her compression bra no longer fits as the lymphedema has caused swelling that makes her current  compression bra act more like a tourniquet then the support to help decrease her breast swelling.   Patient Active Problem List   Diagnosis Date Noted   Acute deep vein thrombosis (DVT) of left lower extremity (HCC) 04/23/2022   Port-A-Cath in place 02/04/2022   Genetic testing 12/07/2021   Malignant neoplasm of upper-inner quadrant of left breast in female, estrogen receptor positive (HCC) 10/19/2021   Piriformis syndrome of left side 07/12/2015   Degenerative arthritis of left knee 07/12/2015   BEE STING REACTION, LOCAL 10/24/2006    is allergic to other, ampicillin, and anesthesia [id number] [propofol].  MEDICAL HISTORY: Past Medical History:  Diagnosis Date   Arthritis    Breast cancer (HCC)    Chicken pox    High cholesterol    PONV (postoperative nausea and vomiting)    Vitamin D deficiency     SURGICAL HISTORY: Past Surgical History:  Procedure Laterality Date   APPENDECTOMY  1959   BACK SURGERY     1996   BREAST LUMPECTOMY WITH AXILLARY LYMPH NODE BIOPSY Left 11/15/2021   Procedure: LEFT BREAST LUMPECTOMY WITH AXILLARY SENTINEL LYMPH NODE BIOPSY;  Surgeon: Emelia Loron, MD;  Location: [facility];  Service: General;  Laterality: Left;   PORTACATH PLACEMENT Right 11/15/2021   Procedure: INSERTION PORT-A-CATH;  Surgeon: Emelia Loron, MD;  Location: [facility];  Service: General;  Laterality: Right;   TONSILLECTOMY AND ADENOIDECTOMY  1954    SOCIAL HISTORY: Social History   Socioeconomic History   Marital status: Married    Spouse name: Not on file   Number of children: Not on file   Years of education: Not on file   Highest education level: Not on file  Occupational History   Not on file  Tobacco Use   Smoking status: Never   Smokeless tobacco: Never  Vaping Use   Vaping Use: Never used  Substance and Sexual Activity   Alcohol use: Yes    Alcohol/week: 1.0 standard drink of alcohol    Types: 1 Glasses of wine per week    Drug use: Never   Sexual activity: Not on file  Other Topics Concern   Not on file  Social History Narrative   Married    Retired    International aid/development worker of Corporate investment banker Strain: Low Risk  (09/03/2021)   Overall Financial Resource Strain (CARDIA)    Difficulty of Paying Living Expenses: Not hard at all  Food Insecurity: No Food Insecurity (09/03/2021)   Hunger Vital Sign    Worried About Running Out of Food in the Last Year: Never true    Ran Out of Food in the Last Year: Never true  Transportation Needs: No Transportation Needs (09/03/2021)   PRAPARE - Administrator, Civil Service (Medical): No    Lack of Transportation (Non-Medical): No  Physical Activity: Sufficiently Active (09/03/2021)   Exercise Vital Sign    Days of Exercise per Week: 4 days    Minutes of Exercise per Session: 120 min  Stress: No Stress Concern Present (09/03/2021)   [employer] of Occupational Health - Occupational Stress Questionnaire    Feeling of Stress : Not at all  Social Connections: Socially Integrated (09/03/2021)   Social Connection and Isolation Panel [NHANES]    Frequency of Communication with  Friends and Family: More than three times a week    Frequency of Social Gatherings with Friends and Family: More than three times a week    Attends Religious Services: More than 4 times per year    Active Member of [employer] or Organizations: Yes    Attends Engineer, structural: More than 4 times per year    Marital Status: Married  Catering manager Violence: Not At Risk (09/03/2021)   Humiliation, Afraid, Rape, and Kick questionnaire    Fear of Current or Ex-Partner: No    Emotionally Abused: No    Physically Abused: No    Sexually Abused: No    FAMILY HISTORY: Family History  Problem Relation Age of Onset   Stomach cancer Mother 65   Heart attack Father 40   Cancer Sister        dx mid 55s; unknown primary w/ mets; treated like ovarian cancer   Heart attack  Brother 80   Heart attack Brother 57    Review of Systems  Constitutional:  Negative for appetite change, chills, fatigue, fever and unexpected weight change.  HENT:   Negative for hearing loss, lump/mass and trouble swallowing.   Eyes:  Negative for eye problems and icterus.  Respiratory:  Negative for chest tightness, cough and shortness of breath.   Cardiovascular:  Negative for chest pain, leg swelling and palpitations.  Gastrointestinal:  Negative for abdominal distention, abdominal pain, constipation, diarrhea, nausea and vomiting.  Endocrine: Negative for hot flashes.  Genitourinary:  Negative for difficulty urinating.   Musculoskeletal:  Negative for arthralgias.  Skin:  Negative for itching and rash.  Neurological:  Negative for dizziness, extremity weakness, headaches and numbness.  Hematological:  Negative for adenopathy. Does not bruise/bleed easily.  Psychiatric/Behavioral:  Negative for depression. The patient is not nervous/anxious.       PHYSICAL EXAMINATION   Onc Performance Status - 07/16/22 0900       KPS SCALE   KPS % SCORE Able to carry on normal activity, minor s/s of disease             Vitals:   07/16/22 0936  BP: 138/63  Pulse: (!) 59  Resp: 18  Temp: 97.8 F (36.6 C)  SpO2: 100%    Physical Exam Constitutional:      General: She is not in acute distress.    Appearance: Normal appearance. She is not toxic-appearing.  HENT:     Head: Normocephalic and atraumatic.  Eyes:     General: No scleral icterus. Cardiovascular:     Rate and Rhythm: Normal rate and regular rhythm.     Pulses: Normal pulses.     Heart sounds: Normal heart sounds.  Pulmonary:     Effort: Pulmonary effort is normal.     Breath sounds: Normal breath sounds.  Chest:     Comments: Left breast + swelling and breast lymphedema present, no sign of local recurrence, s/p lumpectomy and radiation Abdominal:     General: Abdomen is flat. Bowel sounds are normal. There is  no distension.     Palpations: Abdomen is soft.     Tenderness: There is no abdominal tenderness.  Musculoskeletal:        General: No swelling.     Cervical back: Neck supple.  Lymphadenopathy:     Cervical: No cervical adenopathy.  Skin:    General: Skin is warm and dry.     Findings: No rash.  Neurological:     General: No focal  deficit present.     Mental Status: She is alert.  Psychiatric:        Mood and Affect: Mood normal.        Behavior: Behavior normal.     LABORATORY DATA:  CBC    Component Value Date/Time   WBC 4.6 07/16/2022 0917   RBC 4.04 07/16/2022 0917   HGB 11.9 (L) 07/16/2022 0917   HGB 12.1 06/25/2022 0820   HCT 35.4 (L) 07/16/2022 0917   HCT 36.8 06/26/2022 1120   PLT 114 (L) 07/16/2022 0917   PLT 116 (L) 06/25/2022 0820   MCV 87.6 07/16/2022 0917   MCH 29.5 07/16/2022 0917   MCHC 33.6 07/16/2022 0917   RDW 12.5 07/16/2022 0917   LYMPHSABS 1.1 07/16/2022 0917   MONOABS 0.4 07/16/2022 0917   EOSABS 0.0 07/16/2022 0917   BASOSABS 0.0 07/16/2022 0917    CMP     Component Value Date/Time   NA 139 06/25/2022 0820   K 4.5 06/25/2022 0820   CL 105 06/25/2022 0820   CO2 28 06/25/2022 0820   GLUCOSE 101 (H) 06/25/2022 0820   BUN 19 06/25/2022 0820   CREATININE 0.70 06/25/2022 0820   CREATININE 0.76 11/03/2019 0921   CALCIUM 9.7 06/25/2022 0820   PROT 6.4 (L) 06/25/2022 0820   ALBUMIN 4.1 06/25/2022 0820   AST 15 06/25/2022 0820   ALT 11 06/25/2022 0820   ALKPHOS 85 06/25/2022 0820   BILITOT 0.6 06/25/2022 0820   GFRNONAA >60 06/25/2022 0820   GFRNONAA 78 11/03/2019 0921   GFRAA 91 11/03/2019 0921     ASSESSMENT and THERAPY PLAN:   Malignant neoplasm of upper-inner quadrant of left breast in female, estrogen receptor positive (HCC) Tationa is a 77 year old woman with history of stage IIa ER positive HER2 positive left-sided invasive ductal carcinoma diagnosed in July 2023 status post lumpectomy followed by adjuvant chemotherapy,  maintenance Herceptin, adjuvant radiation, with antiestrogen therapy with Anastrozole.    Olanna continues on Herceptin and anastrozole.  She is tolerating both of these well.  She will proceed with echocardiogram in May.  Her labs indicate a mild thrombocytopenia.  We will continue to monitor this.  It could be a slowly resolving side effect from her previous chemotherapy.  Her biggest issue is this left breast lymphedema.  She understandably needs new compression bras and her physical therapist who is treating her has written scripts for this.  She will let us know if there are any insurance forms or letter as we need to write to her insurance company to cover these as they are tremendously needed for her breast lymphedema to be able to improve.  We will see Varina back in 3 weeks for labs, follow-up, and her next infusion.    All questions were answered. The patient knows to call the clinic with any problems, questions or concerns. We can certainly see the patient much sooner if necessary.  Total encounter time:20 minutes*in face-to-face visit time, chart review, lab review, care coordination, order entry, and documentation of the encounter time.  Lillard Anes, NP 07/16/22 10:27 AM Medical Oncology and Hematology [facility] [address] [place], [place] [address] Tel. [phone]    Fax. [phone]  *Total Encounter Time as defined by the Centers for Medicare and Medicaid Services includes, in addition to the face-to-face time of a patient visit (documented in the note above) non-face-to-face time: obtaining and reviewing outside history, ordering and reviewing medications, tests or procedures, care coordination (communications with other  health care professionals or caregivers) and documentation in the medical record.

## 2022-07-17 ENCOUNTER — Encounter: Payer: Self-pay | Admitting: Physical Therapy

## 2022-07-17 ENCOUNTER — Ambulatory Visit: Payer: Medicare HMO | Attending: General Surgery | Admitting: Physical Therapy

## 2022-07-17 DIAGNOSIS — Z483 Aftercare following surgery for neoplasm: Secondary | ICD-10-CM | POA: Diagnosis not present

## 2022-07-17 DIAGNOSIS — R293 Abnormal posture: Secondary | ICD-10-CM | POA: Insufficient documentation

## 2022-07-17 DIAGNOSIS — Z170 Estrogen receptor positive status [ER+]: Secondary | ICD-10-CM | POA: Insufficient documentation

## 2022-07-17 DIAGNOSIS — C50212 Malignant neoplasm of upper-inner quadrant of left female breast: Secondary | ICD-10-CM | POA: Insufficient documentation

## 2022-07-17 DIAGNOSIS — I890 Lymphedema, not elsewhere classified: Secondary | ICD-10-CM | POA: Insufficient documentation

## 2022-07-17 DIAGNOSIS — C50912 Malignant neoplasm of unspecified site of left female breast: Secondary | ICD-10-CM | POA: Diagnosis not present

## 2022-07-17 NOTE — Therapy (Signed)
OUTPATIENT PHYSICAL THERAPY  UPPER EXTREMITY ONCOLOGY TREATMENT  Patient Name: Patricia Marshall MRN: [id number] DOB:02-21-1946, 77 y.o., female Today's Date: 07/17/2022  END OF SESSION:  PT End of Session - 07/17/22 0908     Visit Number 6    Number of Visits 9    Date for PT Re-Evaluation 07/29/22    PT Start Time 0903    PT Stop Time 0956    PT Time Calculation (min) 53 min    Activity Tolerance Patient tolerated treatment well    Behavior During Therapy WFL for tasks assessed/performed              Past Medical History:  Diagnosis Date   Arthritis    Breast cancer (HCC)    Chicken pox    High cholesterol    PONV (postoperative nausea and vomiting)    Vitamin D deficiency    Past Surgical History:  Procedure Laterality Date   APPENDECTOMY  1959   BACK SURGERY     1996   BREAST LUMPECTOMY WITH AXILLARY LYMPH NODE BIOPSY Left 11/15/2021   Procedure: LEFT BREAST LUMPECTOMY WITH AXILLARY SENTINEL LYMPH NODE BIOPSY;  Surgeon: Emelia Loron, MD;  Location: [facility];  Service: General;  Laterality: Left;   PORTACATH PLACEMENT Right 11/15/2021   Procedure: INSERTION PORT-A-CATH;  Surgeon: Emelia Loron, MD;  Location: [facility];  Service: General;  Laterality: Right;   TONSILLECTOMY AND ADENOIDECTOMY  1954   Patient Active Problem List   Diagnosis Date Noted   Acute deep vein thrombosis (DVT) of left lower extremity (HCC) 04/23/2022   Port-A-Cath in place 02/04/2022   Genetic testing 12/07/2021   Malignant neoplasm of upper-inner quadrant of left breast in female, estrogen receptor positive (HCC) 10/19/2021   Piriformis syndrome of left side 07/12/2015   Degenerative arthritis of left knee 07/12/2015   BEE STING REACTION, LOCAL 10/24/2006    PCP: [name] NP   REFERRING PROVIDER: Rachel Moulds, MD   REFERRING DIAG:  C50.212,Z17.0 (ICD-10-CM) - Malignant neoplasm of upper-inner quadrant of left breast in female,  estrogen receptor positive    THERAPY DIAG:  Lymphedema, not elsewhere classified  Aftercare following surgery for neoplasm  Abnormal posture  Malignant neoplasm of upper-inner quadrant of left breast in female, estrogen receptor positive (HCC)  ONSET DATE: Feb 2024  Rationale for Evaluation and Treatment: Rehabilitation  SUBJECTIVE:                                                                                                                                                                                           SUBJECTIVE STATEMENT: I have an  appointment to get a new compression bra at 3:30. I found this tank top that I have been wearing. It does not hurt.   PERTINENT HISTORY: Patient was diagnosed on 09/20/2021 with left grade 2 invasive ductal carcinoma breast cancer. She underwent a left lumpectomy and sentinel node biopsy (2 negative nodes) on 11/15/2021. 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 No  PRECAUTIONS: Other: L UE lymphedema risk, DVT (11/19/21)  WEIGHT BEARING RESTRICTIONS: No  FALLS:  Has patient fallen in last 6 months? No  LIVING ENVIRONMENT: Lives with: lives with their spouse Lives in: House/apartment Stairs: Yes; Internal: about 14 (has 2 sets)  steps; on left going up and External: 3 steps; none Has following equipment at home: None  OCCUPATION: retired  LEISURE: pt went to the gym today and began lifting 3 lb weights, she has been lifting 3 lbs at home, walks 30 min  HAND DOMINANCE: right   PRIOR LEVEL OF FUNCTION: Independent  PATIENT GOALS: to get the swelling down   OBJECTIVE:  COGNITION: Overall cognitive status: Within functional limits for tasks assessed   PALPATION: Fibrosis palpable in inferior breast  OBSERVATIONS / OTHER ASSESSMENTS: L breast about 30% larger than R, slightly darker in color post radiation, fibrosis present in  inferior breast  POSTURE: forward head, rounded shoulders  UPPER EXTREMITY AROM/PROM: WFL  UPPER EXTREMITY STRENGTH:   Surgery type/Date: Left lumpectomy with SLNB 11/15/2021  Number of lymph nodes removed: 0/2  Current/past treatment (chemo, radiation, hormone therapy): Completed chemo and radiation. Infusions of Herceptin for a year   LYMPHEDEMA ASSESSMENTS:   LANDMARK RIGHT  eval  At axilla    15 cm proximal to olecranon process 24.7  10 cm proximal to olecranon process 23  Olecranon process 21.5  15 cm proximal to ulnar styloid process 22  10 cm proximal to ulnar styloid process 19  Just proximal to ulnar styloid process 14.5  Across hand at thumb web space 18.3  At base of 2nd digit 5.8  (Blank rows = not tested)  LANDMARK LEFT  eval  At axilla    15 cm proximal to olecranon process 24  10 cm proximal to olecranon process 22.4  Olecranon process 21.5  15 cm proximal to ulnar styloid process 21.4  10 cm proximal to ulnar styloid process 18.6  Just proximal to ulnar styloid process 14.1  Across hand at thumb web space 17.5  At base of 2nd digit 5.6  (Blank rows = not tested)   BREAST COMPLAINTS: 35   TODAY'S TREATMENT:                                                                                                                                          DATE:  07/17/22: In supine: Short neck, 5 diaphragmatic  breaths, R axillary nodes and establishment of interaxillary pathway, L inguinal nodes and establishment of axilloinguinal pathway, then L breast moving fluid towards pathways spending extra time in any areas of fibrosis then retracing all steps.    07/15/22: In supine: Short neck, 5 diaphragmatic breaths, R axillary nodes and establishment of interaxillary pathway, L inguinal nodes and establishment of axilloinguinal pathway, then L breast moving fluid towards pathways spending extra time in any areas of fibrosis then retracing all steps. Had pt return  demonstrate steps again today and gave v/c to keep fingers together, use light pressure and go slowly. Issued rx for a new compression bra.   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 Educated pt throughout and had her return demonstrate each step. Pt was very familiar with the sequence today and required much less cueing for the pathways. She still required mod v/c for the breast.    07/09/22: In supine: Short neck, 5 diaphragmatic breaths, R axillary nodes and establishment of interaxillary pathway, L inguinal nodes and establishment of axilloinguinal pathway, then L breast moving fluid towards pathways spending extra time in any areas of fibrosis then retracing all steps. Educated pt throughout and had her return demonstrate each step up until the breast itself. Pt required v/c for proper skin stretch technique. Educated pt about availability of different compression bras that may work better for her since she can not tolerate her current compression bra.   07/04/22: In supine: Short neck, Rt axilla and Lt inguinal nodes, superficial and deep abdominals, anterior inter-axillary and Lt axillo-inguinal anastomosis, and Lt breast focusing on areas of swelling redirecting towards anastomosis. Educated pt throughout on each step, anatomy and physiology of the lymphatic system, importance of proper skin stretch and sequence.   07/01/22: cut 1/2 inch grey foam for pt to wear in her bra for additional compression in inferior portion with fibrosis    PATIENT EDUCATION:  Education details: anatomy and physiology of the lymphatic system, how to manage  lymphedema Person educated: Patient Education method: Explanation Education comprehension: verbalized understanding  HOME EXERCISE PROGRAM: Wear compression bra during waking hours  ASSESSMENT:  CLINICAL IMPRESSION: Pt found a tube top to try and wear instead of her compression bra. There is some compression so educated pt to also try placing foam at inferior breast. Pt has an appointment at [facility] today to get new compression bras that will be more comfortable. Continued with MLD to L breast to decrease swelling.    OBJECTIVE IMPAIRMENTS: decreased knowledge of condition, decreased knowledge of use of DME, increased edema, postural dysfunction, and pain.   ACTIVITY LIMITATIONS:  none  PARTICIPATION LIMITATIONS:  none  PERSONAL FACTORS:  none  are also affecting patient's functional outcome.   REHAB POTENTIAL: Good  CLINICAL DECISION MAKING: Stable/uncomplicated  EVALUATION COMPLEXITY: Low  GOALS: Goals reviewed with patient? Yes  SHORT TERM GOALS=LONG TERM GOALS Target date: 07/29/22  Pt will be independent in self MLD for long term management of lymphedema. Baseline: Goal status: INITIAL  2.  Pt will be able to independently manage her lymphedema with self MLD and her compression bra.  Baseline:  Goal status: INITIAL  3.  Pt will report a 50% improvement in feeling of discomfort and heaviness in L breast to allow improved comfort.  Baseline:  Goal status: INITIAL    PLAN:  PT FREQUENCY: 2x/week  PT DURATION: 4 weeks  PLANNED INTERVENTIONS: Therapeutic exercises, Therapeutic activity, Patient/Family education, Self Care, Orthotic/Fit training, Manual lymph drainage, Compression bandaging, scar mobilization, Vasopneumatic device, and Manual therapy  PLAN FOR NEXT SESSION: cont MLD to L breast and instruct pt, how was foam, how does her compression bra fit  [employer], PT 07/17/2022, 10:04 AM

## 2022-07-31 ENCOUNTER — Encounter: Payer: Self-pay | Admitting: Physical Therapy

## 2022-07-31 ENCOUNTER — Ambulatory Visit (HOSPITAL_COMMUNITY)
Admission: RE | Admit: 2022-07-31 | Discharge: 2022-07-31 | Disposition: A | Discharge status: Home / Self Care | Payer: Medicare HMO | Source: Ambulatory Visit | Attending: Hematology and Oncology | Admitting: Hematology and Oncology

## 2022-07-31 ENCOUNTER — Ambulatory Visit: Payer: Medicare HMO | Admitting: Physical Therapy

## 2022-07-31 DIAGNOSIS — Z170 Estrogen receptor positive status [ER+]: Secondary | ICD-10-CM

## 2022-07-31 DIAGNOSIS — Z483 Aftercare following surgery for neoplasm: Secondary | ICD-10-CM | POA: Diagnosis not present

## 2022-07-31 DIAGNOSIS — C50212 Malignant neoplasm of upper-inner quadrant of left female breast: Secondary | ICD-10-CM

## 2022-07-31 DIAGNOSIS — Z79811 Long term (current) use of aromatase inhibitors: Secondary | ICD-10-CM | POA: Insufficient documentation

## 2022-07-31 DIAGNOSIS — R293 Abnormal posture: Secondary | ICD-10-CM

## 2022-07-31 DIAGNOSIS — I890 Lymphedema, not elsewhere classified: Secondary | ICD-10-CM | POA: Diagnosis not present

## 2022-07-31 DIAGNOSIS — Z0189 Encounter for other specified special examinations: Secondary | ICD-10-CM

## 2022-07-31 DIAGNOSIS — Z08 Encounter for follow-up examination after completed treatment for malignant neoplasm: Secondary | ICD-10-CM | POA: Insufficient documentation

## 2022-07-31 LAB — ECHOCARDIOGRAM COMPLETE
AR max vel: 2.54 cm2
AV Area VTI: 2.42 cm2
AV Area mean vel: 2.39 cm2
AV Mean grad: 4 mmHg
AV Peak grad: 6.9 mmHg
Ao pk vel: 1.31 m/s
Area-P 1/2: 3.58 cm2
S' Lateral: 2.6 cm
Single Plane A4C EF: 65.4 %

## 2022-07-31 NOTE — Progress Notes (Signed)
  Echocardiogram 2D Echocardiogram has been performed.  Patricia Marshall 07/31/2022, 9:50 AM

## 2022-07-31 NOTE — Therapy (Signed)
OUTPATIENT PHYSICAL THERAPY  UPPER EXTREMITY ONCOLOGY TREATMENT  Patient Name: Patricia Marshall MRN: [id number] DOB:1946/01/20, 77 y.o., female Today's Date: 07/31/2022  END OF SESSION:  PT End of Session - 07/31/22 1503     Visit Number 7    Number of Visits 9    Date for PT Re-Evaluation 07/29/22    PT Start Time 1503    PT Stop Time 1602    PT Time Calculation (min) 59 min    Activity Tolerance Patient tolerated treatment well    Behavior During Therapy WFL for tasks assessed/performed              Past Medical History:  Diagnosis Date   Arthritis    Breast cancer (HCC)    Chicken pox    High cholesterol    PONV (postoperative nausea and vomiting)    Vitamin D deficiency    Past Surgical History:  Procedure Laterality Date   APPENDECTOMY  1959   BACK SURGERY     1996   BREAST LUMPECTOMY WITH AXILLARY LYMPH NODE BIOPSY Left 11/15/2021   Procedure: LEFT BREAST LUMPECTOMY WITH AXILLARY SENTINEL LYMPH NODE BIOPSY;  Surgeon: Emelia Loron, MD;  Location: [facility];  Service: General;  Laterality: Left;   PORTACATH PLACEMENT Right 11/15/2021   Procedure: INSERTION PORT-A-CATH;  Surgeon: Emelia Loron, MD;  Location: [facility];  Service: General;  Laterality: Right;   TONSILLECTOMY AND ADENOIDECTOMY  1954   Patient Active Problem List   Diagnosis Date Noted   Acute deep vein thrombosis (DVT) of left lower extremity (HCC) 04/23/2022   Port-A-Cath in place 02/04/2022   Genetic testing 12/07/2021   Malignant neoplasm of upper-inner quadrant of left breast in female, estrogen receptor positive (HCC) 10/19/2021   Piriformis syndrome of left side 07/12/2015   Degenerative arthritis of left knee 07/12/2015   BEE STING REACTION, LOCAL 10/24/2006    PCP: [name] NP   REFERRING PROVIDER: Rachel Moulds, MD   REFERRING DIAG:  C50.212,Z17.0 (ICD-10-CM) - Malignant neoplasm of upper-inner quadrant of left breast in female,  estrogen receptor positive    THERAPY DIAG:  Lymphedema, not elsewhere classified  Aftercare following surgery for neoplasm  Abnormal posture  Malignant neoplasm of upper-inner quadrant of left breast in female, estrogen receptor positive (HCC)  ONSET DATE: Feb 2024  Rationale for Evaluation and Treatment: Rehabilitation  SUBJECTIVE:                                                                                                                                                                                           SUBJECTIVE STATEMENT: The new compression  bra is just as tight feeling. I wore in the [employer] and they found another one that would work better. I did the massage and wore the bra on vacation.   PERTINENT HISTORY: Patient was diagnosed on 09/20/2021 with left grade 2 invasive ductal carcinoma breast cancer. She underwent a left lumpectomy and sentinel node biopsy (2 negative nodes) on 11/15/2021. 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 No  PRECAUTIONS: Other: L UE lymphedema risk, DVT (11/19/21)  WEIGHT BEARING RESTRICTIONS: No  FALLS:  Has patient fallen in last 6 months? No  LIVING ENVIRONMENT: Lives with: lives with their spouse Lives in: House/apartment Stairs: Yes; Internal: about 14 (has 2 sets)  steps; on left going up and External: 3 steps; none Has following equipment at home: None  OCCUPATION: retired  LEISURE: pt went to the gym today and began lifting 3 lb weights, she has been lifting 3 lbs at home, walks 30 min  HAND DOMINANCE: right   PRIOR LEVEL OF FUNCTION: Independent  PATIENT GOALS: to get the swelling down   OBJECTIVE:  COGNITION: Overall cognitive status: Within functional limits for tasks assessed   PALPATION: Fibrosis palpable in inferior breast  OBSERVATIONS / OTHER ASSESSMENTS: L breast about 30% larger than R, slightly  darker in color post radiation, fibrosis present in inferior breast  POSTURE: forward head, rounded shoulders  UPPER EXTREMITY AROM/PROM: WFL  UPPER EXTREMITY STRENGTH:   Surgery type/Date: Left lumpectomy with SLNB 11/15/2021  Number of lymph nodes removed: 0/2  Current/past treatment (chemo, radiation, hormone therapy): Completed chemo and radiation. Infusions of Herceptin for a year   LYMPHEDEMA ASSESSMENTS:   LANDMARK RIGHT  eval  At axilla    15 cm proximal to olecranon process 24.7  10 cm proximal to olecranon process 23  Olecranon process 21.5  15 cm proximal to ulnar styloid process 22  10 cm proximal to ulnar styloid process 19  Just proximal to ulnar styloid process 14.5  Across hand at thumb web space 18.3  At base of 2nd digit 5.8  (Blank rows = not tested)  LANDMARK LEFT  eval  At axilla    15 cm proximal to olecranon process 24  10 cm proximal to olecranon process 22.4  Olecranon process 21.5  15 cm proximal to ulnar styloid process 21.4  10 cm proximal to ulnar styloid process 18.6  Just proximal to ulnar styloid process 14.1  Across hand at thumb web space 17.5  At base of 2nd digit 5.6  (Blank rows = not tested)   BREAST COMPLAINTS: 35   TODAY'S TREATMENT:                                                                                                                                          DATE:  07/31/22: In supine: Short neck, 5 diaphragmatic breaths, R axillary nodes and establishment of interaxillary pathway, L inguinal nodes and establishment of axilloinguinal pathway, then L breast moving fluid towards pathways spending extra time in any areas of fibrosis then retracing all steps. Discussed need to continue to wear compression bra and do daily MLD. Also educated pt to wear her compression bra if she flies. Educated pt about lymphedema risk reduction practices and issued handout.    07/17/22: In supine: Short neck, 5 diaphragmatic breaths, R  axillary nodes and establishment of interaxillary pathway, L inguinal nodes and establishment of axilloinguinal pathway, then L breast moving fluid towards pathways spending extra time in any areas of fibrosis then retracing all steps.    07/15/22: In supine: Short neck, 5 diaphragmatic breaths, R axillary nodes and establishment of interaxillary pathway, L inguinal nodes and establishment of axilloinguinal pathway, then L breast moving fluid towards pathways spending extra time in any areas of fibrosis then retracing all steps. Had pt return demonstrate steps again today and gave v/c to keep fingers together, use light pressure and go slowly. Issued rx for a new compression bra.   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 Educated pt throughout and had her return demonstrate each step. Pt was very familiar with the sequence today and required much less cueing for the pathways. She still required mod v/c for the breast.    07/09/22: In supine: Short neck, 5 diaphragmatic breaths, R axillary nodes and establishment of interaxillary pathway, L inguinal nodes and establishment of axilloinguinal pathway, then L breast moving fluid towards pathways spending extra time in any areas of fibrosis then retracing all steps. Educated pt throughout and had her return demonstrate each step up until the breast itself. Pt required v/c for proper skin stretch technique. Educated pt about availability of different compression bras that may work better for her since she can not tolerate her current compression bra.   07/04/22: In supine: Short neck, Rt axilla and Lt inguinal nodes,  superficial and deep abdominals, anterior inter-axillary and Lt axillo-inguinal anastomosis, and Lt breast focusing on areas of swelling redirecting towards anastomosis. Educated pt throughout on each step, anatomy and physiology of the lymphatic system, importance of proper skin stretch and sequence.   07/01/22: cut 1/2 inch grey foam for pt to wear in her bra for additional compression in inferior portion with fibrosis    PATIENT EDUCATION:  Education details: lymphedema risk reduction practices Person educated: Patient Education method: Explanation and Handouts Education comprehension: verbalized understanding  HOME EXERCISE PROGRAM: Wear compression bra during waking hours  ASSESSMENT:  CLINICAL IMPRESSION: Pt has obtained a compression bra that she is able to tolerate. She has been able to independently manage her L breast lymphedema over the last two weeks. She still have heaviness in her breast but is independent in self MLD and wearing compression. She will be discharged at this time. She will continue Ldex screenings every 3 months for the first 2 years post op.    OBJECTIVE IMPAIRMENTS: decreased knowledge of condition, decreased knowledge of use of DME, increased edema, postural dysfunction, and pain.   ACTIVITY LIMITATIONS:  none  PARTICIPATION LIMITATIONS:  none  PERSONAL FACTORS:  none  are also affecting patient's functional outcome.   REHAB POTENTIAL: Good  CLINICAL DECISION MAKING: Stable/uncomplicated  EVALUATION COMPLEXITY: Low  GOALS: Goals reviewed with patient? Yes  SHORT TERM GOALS=LONG TERM GOALS Target date: 07/29/22  Pt will be independent in self MLD for long term management of lymphedema. Baseline: Goal status: MET 07/31/22 - pt feels independent with this  2.  Pt will be able to independently manage her lymphedema with self MLD and her compression bra.  Baseline:  Goal status: MET 07/31/22- pt is independent with this  3.  Pt will report a 50%  improvement in feeling of discomfort and heaviness in L breast to allow improved comfort.  Baseline:  Goal status: NOT MET 07/31/22: 10% improvement    PLAN:  PT FREQUENCY: 2x/week  PT DURATION: 4 weeks  PLANNED INTERVENTIONS: Therapeutic exercises, Therapeutic activity, Patient/Family education, Self Care, Orthotic/Fit training, Manual lymph drainage, Compression bandaging, scar mobilization, Vasopneumatic device, and Manual therapy  PLAN FOR NEXT SESSION: d/c this visit  Leonette Most, PT 07/31/2022, 5:10 PM   PHYSICAL THERAPY DISCHARGE SUMMARY  Visits from Start of Care: 7  Current functional level related to goals / functional outcomes: See above, pt independent in self MLD and has a compression bra   Remaining deficits: Still has heaviness in L breast   Education / Equipment: Self MLD, lymphedema, lymphedema risk reduction, compression   Patient agrees to discharge. Patient goals were partially met. Patient is being discharged due to meeting the stated rehab goals.  Milagros Loll [place], [place] 07/31/22 5:14 PM

## 2022-08-02 DIAGNOSIS — C50912 Malignant neoplasm of unspecified site of left female breast: Secondary | ICD-10-CM | POA: Diagnosis not present

## 2022-08-02 NOTE — Telephone Encounter (Signed)
FYI

## 2022-08-05 ENCOUNTER — Ambulatory Visit: Payer: Medicare HMO | Admitting: Physical Therapy

## 2022-08-06 ENCOUNTER — Encounter: Payer: Self-pay | Admitting: *Deleted

## 2022-08-06 ENCOUNTER — Inpatient Hospital Stay: Payer: Medicare HMO | Attending: Hematology and Oncology | Admitting: Adult Health

## 2022-08-06 ENCOUNTER — Other Ambulatory Visit: Payer: Self-pay

## 2022-08-06 ENCOUNTER — Inpatient Hospital Stay: Payer: Medicare HMO

## 2022-08-06 ENCOUNTER — Encounter: Payer: Self-pay | Admitting: Adult Health

## 2022-08-06 VITALS — BP 133/61 | HR 67 | Temp 98.1°F | Resp 18 | Ht 65.0 in | Wt 124.8 lb

## 2022-08-06 DIAGNOSIS — M1712 Unilateral primary osteoarthritis, left knee: Secondary | ICD-10-CM | POA: Diagnosis not present

## 2022-08-06 DIAGNOSIS — Z170 Estrogen receptor positive status [ER+]: Secondary | ICD-10-CM | POA: Diagnosis not present

## 2022-08-06 DIAGNOSIS — I890 Lymphedema, not elsewhere classified: Secondary | ICD-10-CM | POA: Insufficient documentation

## 2022-08-06 DIAGNOSIS — Z5112 Encounter for antineoplastic immunotherapy: Secondary | ICD-10-CM | POA: Insufficient documentation

## 2022-08-06 DIAGNOSIS — Z79811 Long term (current) use of aromatase inhibitors: Secondary | ICD-10-CM | POA: Diagnosis not present

## 2022-08-06 DIAGNOSIS — Z79899 Other long term (current) drug therapy: Secondary | ICD-10-CM | POA: Insufficient documentation

## 2022-08-06 DIAGNOSIS — C50212 Malignant neoplasm of upper-inner quadrant of left female breast: Secondary | ICD-10-CM | POA: Insufficient documentation

## 2022-08-06 DIAGNOSIS — Z95828 Presence of other vascular implants and grafts: Secondary | ICD-10-CM

## 2022-08-06 LAB — CMP (CANCER CENTER ONLY)
ALT: 10 U/L (ref 0–44)
AST: 15 U/L (ref 15–41)
Albumin: 4.2 g/dL (ref 3.5–5.0)
Alkaline Phosphatase: 85 U/L (ref 38–126)
Anion gap: 6 (ref 5–15)
BUN: 21 mg/dL (ref 8–23)
CO2: 27 mmol/L (ref 22–32)
Calcium: 9.1 mg/dL (ref 8.9–10.3)
Chloride: 105 mmol/L (ref 98–111)
Creatinine: 0.63 mg/dL (ref 0.44–1.00)
GFR, Estimated: >60 mL/min (ref >60)
Glucose, Bld: 115 mg/dL — ABNORMAL HIGH (ref 70–99)
Potassium: 4.4 mmol/L (ref 3.5–5.1)
Sodium: 138 mmol/L (ref 135–145)
Total Bilirubin: 0.5 mg/dL (ref 0.3–1.2)
Total Protein: 6.5 g/dL (ref 6.5–8.1)

## 2022-08-06 LAB — CBC WITH DIFFERENTIAL/PLATELET
Abs Immature Granulocytes: 0.01 10*3/uL (ref 0.00–0.07)
Basophils Absolute: 0 10*3/uL (ref 0.0–0.1)
Basophils Relative: 1 %
Eosinophils Absolute: 0.1 10*3/uL (ref 0.0–0.5)
Eosinophils Relative: 2 %
HCT: 34.6 % — ABNORMAL LOW (ref 36.0–46.0)
Hemoglobin: 12.1 g/dL (ref 12.0–15.0)
Immature Granulocytes: 0 %
Lymphocytes Relative: 25 %
Lymphs Abs: 1.1 10*3/uL (ref 0.7–4.0)
MCH: 30.5 pg (ref 26.0–34.0)
MCHC: 35 g/dL (ref 30.0–36.0)
MCV: 87.2 fL (ref 80.0–100.0)
Monocytes Absolute: 0.4 10*3/uL (ref 0.1–1.0)
Monocytes Relative: 9 %
Neutro Abs: 2.9 10*3/uL (ref 1.7–7.7)
Neutrophils Relative %: 63 %
Platelets: 111 10*3/uL — ABNORMAL LOW (ref 150–400)
RBC: 3.97 MIL/uL (ref 3.87–5.11)
RDW: 12.2 % (ref 11.5–15.5)
WBC: 4.6 10*3/uL (ref 4.0–10.5)
nRBC: 0 % (ref 0.0–0.2)

## 2022-08-06 MED ORDER — SODIUM CHLORIDE 0.9% FLUSH
10.0000 mL | INTRAVENOUS | Status: DC | PRN
  Administered 2022-08-06: 10 mL

## 2022-08-06 MED ORDER — LORATADINE 10 MG PO TABS
10.0000 mg | ORAL_TABLET | Freq: Once | ORAL | Status: AC
  Administered 2022-08-06: 10 mg via ORAL
  Filled 2022-08-06: qty 1

## 2022-08-06 MED ORDER — TRASTUZUMAB-ANNS CHEMO 150 MG IV SOLR
6.0000 mg/kg | Freq: Once | INTRAVENOUS | Status: AC
  Administered 2022-08-06: 336 mg via INTRAVENOUS
  Filled 2022-08-06: qty 16

## 2022-08-06 MED ORDER — SODIUM CHLORIDE 0.9% FLUSH
10.0000 mL | Freq: Once | INTRAVENOUS | Status: AC
  Administered 2022-08-06: 10 mL

## 2022-08-06 MED ORDER — HEPARIN SOD (PORK) LOCK FLUSH 100 UNIT/ML IV SOLN
500.0000 [IU] | Freq: Once | INTRAVENOUS | Status: AC | PRN
  Administered 2022-08-06: 500 [IU]

## 2022-08-06 MED ORDER — ACETAMINOPHEN 325 MG PO TABS
650.0000 mg | ORAL_TABLET | Freq: Once | ORAL | Status: AC
  Administered 2022-08-06: 650 mg via ORAL
  Filled 2022-08-06: qty 2

## 2022-08-06 MED ORDER — SODIUM CHLORIDE 0.9 % IV SOLN: Freq: Once | INTRAVENOUS | Status: AC

## 2022-08-06 NOTE — Progress Notes (Signed)
Amb referral to nutrition ordered per Pt request, Ok per NP.

## 2022-08-06 NOTE — Patient Instructions (Signed)
[facility]  Discharge Instructions: Thank you for choosing [facility] to provide your oncology and hematology care.   If you have a lab appointment with the Cancer Center, please go directly to the Cancer Center and check in at the registration area.   Wear comfortable clothing and clothing appropriate for easy access to any Portacath or PICC line.   We strive to give you quality time with your provider. You may need to reschedule your appointment if you arrive late (15 or more minutes).  Arriving late affects you and other patients whose appointments are after yours.  Also, if you miss three or more appointments without notifying the office, you may be dismissed from the clinic at the provider's discretion.      For prescription refill requests, have your pharmacy contact our office and allow 72 hours for refills to be completed.    Today you received the following chemotherapy and/or immunotherapy agents: Trastuzumab      To help prevent nausea and vomiting after your treatment, we encourage you to take your nausea medication as directed.  BELOW ARE SYMPTOMS THAT SHOULD BE REPORTED IMMEDIATELY: *FEVER GREATER THAN 100.4 F (38 C) OR HIGHER *CHILLS OR SWEATING *NAUSEA AND VOMITING THAT IS NOT CONTROLLED WITH YOUR NAUSEA MEDICATION *UNUSUAL SHORTNESS OF BREATH *UNUSUAL BRUISING OR BLEEDING *URINARY PROBLEMS (pain or burning when urinating, or frequent urination) *BOWEL PROBLEMS (unusual diarrhea, constipation, pain near the anus) TENDERNESS IN MOUTH AND THROAT WITH OR WITHOUT PRESENCE OF ULCERS (sore throat, sores in mouth, or a toothache) UNUSUAL RASH, SWELLING OR PAIN  UNUSUAL VAGINAL DISCHARGE OR ITCHING   Items with * indicate a potential emergency and should be followed up as soon as possible or go to the Emergency Department if any problems should occur.  Please show the CHEMOTHERAPY ALERT CARD or IMMUNOTHERAPY ALERT CARD at  check-in to the Emergency Department and triage nurse.  Should you have questions after your visit or need to cancel or reschedule your appointment, please contact [facility]  Dept: [phone]  and follow the prompts.  Office hours are 8:00 a.m. to 4:30 p.m. Monday - Friday. Please note that voicemails left after 4:00 p.m. may not be returned until the following business day.  We are closed weekends and major holidays. You have access to a nurse at all times for urgent questions. Please call the main number to the clinic Dept: [phone] and follow the prompts.   For any non-urgent questions, you may also contact your provider using MyChart. We now offer e-Visits for anyone 18 and older to request care online for non-urgent symptoms. For details visit [email or web address].   Also download the MyChart app! Go to the app store, search "MyChart", open the app, select [facility], and log in with your MyChart username and password.  

## 2022-08-06 NOTE — Assessment & Plan Note (Signed)
Patricia Marshall is a 77 year old woman with stage IIa ER positive, HER2 positive invasive ductal carcinoma of the left breast diagnosed in August 2023 status post left breast lumpectomy followed by adjuvant chemotherapy with Taxol and Herceptin, adjuvant radiation therapy, maintenance trastuzumab, and anastrozole.  Current treatment: Herceptin and anastrozole  Stage IIa left breast invasive ductal carcinoma: She continues on treatment with anastrozole and Herceptin.  She is tolerating this treatment well.  She will continue it. Left breast lymphedema: She does not have any signs of recurrence or infection on her breast exam.  I recommended that she continue to massage her breast.  She has completed physical therapy. Myalgias: This could be related to her anastrozole.  She is drinking plenty of water.  I recommended that she resume her magnesium supplement in the evening to see if this may help. Nutrition questions: We placed a referral to the [facility] cancer [facility] nutrition so that they can follow-up with [facility] during her treatment appointments. At risk for heart failure: Her most recent echo was normal and stable compared to her previous 1.  Her next echocardiogram is due in August 2024.  Patricia Marshall will return in 3 weeks for follow-up with Dr. Al Pimple and her next infusion.

## 2022-08-06 NOTE — Progress Notes (Signed)
[facility] Cancer Follow up:    Patricia Frees, NP [address] [place] [address]   DIAGNOSIS:  Cancer Staging  Malignant neoplasm of upper-inner quadrant of left breast in female, estrogen receptor positive (HCC) Staging form: Breast, AJCC 8th Edition - Pathologic: Stage IIA (pT2, pN0, cM0, G3, ER+, PR-, HER2+) - Signed by Patricia Moulds, MD on 11/26/2021 Histologic grading system: 3 grade system   SUMMARY OF ONCOLOGIC HISTORY: Oncology History  Malignant neoplasm of upper-inner quadrant of left breast in female, estrogen receptor positive (HCC)  09/20/2021 Mammogram   Screening mammogram showed extremely dense breasts and a possible mass in the left breast warranting further evaluation.  Breast density category is D Diagnostic mammogram of the breast showed indeterminate mass in the 11:00 location of the left breast.  Indeterminate mass in the lower anterior left axilla.   10/04/2021 Pathology Results   Pathology showed grade 2 invasive ductal carcinoma, left axillary soft tissue needle core biopsy showed benign breast tissue with patchy changes prognostics from the breast tumor showed ER 90% positive strong staining PR 0%, negative, Ki-67 of 30% and HER2 positive for IHC 3+   10/19/2021 Initial Diagnosis   Malignant neoplasm of upper-inner quadrant of left breast in female, estrogen receptor positive (HCC)   10/23/2021 Cancer Staging   Staging form: Breast, AJCC 8th Edition - Pathologic: Stage IIA (pT2, pN0, cM0, G3, ER+, PR-, HER2+) - Signed by Patricia Moulds, MD on 11/26/2021 Histologic grading system: 3 grade system   11/10/2021 Genetic Testing   Negative hereditary cancer genetic testing: no pathogenic variants detected in [facility] CancerNext-Expanded +RNA Panel.  Report date is November 10, 2021.   The CancerNext-Expanded gene panel offered by [facility] and includes sequencing, rearrangement, and RNA analysis for the following 77 genes: AIP, ALK, APC,  ATM, AXIN2, BAP1, BARD1, BLM, BMPR1A, BRCA1, BRCA2, BRIP1, CDC73, CDH1, CDK4, CDKN1B, CDKN2A, CHEK2, CTNNA1, DICER1, FANCC, FH, FLCN, GALNT12, KIF1B, LZTR1, MAX, MEN1, MET, MLH1, MSH2, MSH3, MSH6, MUTYH, NBN, NF1, NF2, NTHL1, PALB2, PHOX2B, PMS2, POT1, PRKAR1A, PTCH1, PTEN, RAD51C, RAD51D, RB1, RECQL, RET, SDHA, SDHAF2, SDHB, SDHC, SDHD, SMAD4, SMARCA4, SMARCB1, SMARCE1, STK11, SUFU, TMEM127, TP53, TSC1, TSC2, VHL and XRCC2 (sequencing and deletion/duplication); EGFR, EGLN1, HOXB13, KIT, MITF, PDGFRA, POLD1, and POLE (sequencing only); EPCAM and GREM1 (deletion/duplication only).    11/15/2021 Surgery   Left breast lumpectomy: 2.4 cm grade 3 invasive ductal carcinoma, margins -2 sentinel lymph nodes negative.   12/10/2021 -  Chemotherapy   Patient is on Treatment Plan : BREAST Paclitaxel + Trastuzumab q7d / Trastuzumab q21d     03/26/2022 - 04/22/2022 Radiation Therapy   03/26/2022 through 04/22/2022 Site Technique Total Dose (Gy) Dose per Fx (Gy) Completed Fx Beam Energies  Breast, Left: Breast_L 3D 42.56/42.56 2.66 16/16 6X  Breast, Left: Breast_L_Bst 3D 8/8 2 4/4 6X      CURRENT THERAPY: Anastrozole/Herceptin  INTERVAL HISTORY: Patricia Marshall 77 y.o. female returns for f/u prior to receiving Herceptin.  She is tolerating this well.  Her most recent echo occurred on 07/31/2022 and demonstrated a LVEF of 60-65%.    She also underwent bone density testing on May 20, 2022 that demonstrated osteopenia with a T-score of -2.3 in the right femur.  Patricia Marshall continues on anastrozole daily with good tolerance. She is experiencing muscle aches particularly at night and wonders if this could be related to the Anastrozole.   Breast lymphedema--she was c/o of this at her last appt, and since then she notes her  breast gets red after wearing her compression bra.  She has continued left breast heaviness and was discharged from PT after 7 treatments on 07/31/2022.  Patricia Marshall would also like a referral to see the [facility]  dietician, and my nurse placed this referral earlier.    Patient Active Problem List   Diagnosis Date Noted   Acute deep vein thrombosis (DVT) of left lower extremity (HCC) 04/23/2022   Port-A-Cath in place 02/04/2022   Genetic testing 12/07/2021   Malignant neoplasm of upper-inner quadrant of left breast in female, estrogen receptor positive (HCC) 10/19/2021   Piriformis syndrome of left side 07/12/2015   Degenerative arthritis of left knee 07/12/2015   BEE STING REACTION, LOCAL 10/24/2006    is allergic to other, ampicillin, and anesthesia [id number] [propofol].  MEDICAL HISTORY: Past Medical History:  Diagnosis Date   Arthritis    Breast cancer (HCC)    Chicken pox    High cholesterol    PONV (postoperative nausea and vomiting)    Vitamin D deficiency     SURGICAL HISTORY: Past Surgical History:  Procedure Laterality Date   APPENDECTOMY  1959   BACK SURGERY     1996   BREAST LUMPECTOMY WITH AXILLARY LYMPH NODE BIOPSY Left 11/15/2021   Procedure: LEFT BREAST LUMPECTOMY WITH AXILLARY SENTINEL LYMPH NODE BIOPSY;  Surgeon: Patricia Loron, MD;  Location: [facility];  Service: General;  Laterality: Left;   PORTACATH PLACEMENT Right 11/15/2021   Procedure: INSERTION PORT-A-CATH;  Surgeon: Patricia Loron, MD;  Location: [facility];  Service: General;  Laterality: Right;   TONSILLECTOMY AND ADENOIDECTOMY  1954    SOCIAL HISTORY: Social History   Socioeconomic History   Marital status: Married    Spouse name: Not on file   Number of children: Not on file   Years of education: Not on file   Highest education level: Not on file  Occupational History   Not on file  Tobacco Use   Smoking status: Never   Smokeless tobacco: Never  Vaping Use   Vaping Use: Never used  Substance and Sexual Activity   Alcohol use: Yes    Alcohol/week: 1.0 standard drink of alcohol    Types: 1 Glasses of wine per week   Drug use: Never   Sexual activity: Not  on file  Other Topics Concern   Not on file  Social History Narrative   Married    Retired    International aid/development worker of Corporate investment banker Strain: Low Risk  (09/03/2021)   Overall Financial Resource Strain (CARDIA)    Difficulty of Paying Living Expenses: Not hard at all  Food Insecurity: No Food Insecurity (09/03/2021)   Hunger Vital Sign    Worried About Running Out of Food in the Last Year: Never true    Ran Out of Food in the Last Year: Never true  Transportation Needs: No Transportation Needs (09/03/2021)   PRAPARE - Administrator, Civil Service (Medical): No    Lack of Transportation (Non-Medical): No  Physical Activity: Sufficiently Active (09/03/2021)   Exercise Vital Sign    Days of Exercise per Week: 4 days    Minutes of Exercise per Session: 120 min  Stress: No Stress Concern Present (09/03/2021)   [employer] of Occupational Health - Occupational Stress Questionnaire    Feeling of Stress : Not at all  Social Connections: Socially Integrated (09/03/2021)   Social Connection and Isolation Panel [NHANES]    Frequency of Communication with  Friends and Family: More than three times a week    Frequency of Social Gatherings with Friends and Family: More than three times a week    Attends Religious Services: More than 4 times per year    Active Member of [employer] or Organizations: Yes    Attends Engineer, structural: More than 4 times per year    Marital Status: Married  Catering manager Violence: Not At Risk (09/03/2021)   Humiliation, Afraid, Rape, and Kick questionnaire    Fear of Current or Ex-Partner: No    Emotionally Abused: No    Physically Abused: No    Sexually Abused: No    FAMILY HISTORY: Family History  Problem Relation Age of Onset   Stomach cancer Mother 34   Heart attack Father 27   Cancer Sister        dx mid 59s; unknown primary w/ mets; treated like ovarian cancer   Heart attack Brother 18   Heart attack Brother 71     Review of Systems  Constitutional:  Negative for appetite change, chills, fatigue, fever and unexpected weight change.  HENT:   Negative for hearing loss, lump/mass and trouble swallowing.   Eyes:  Negative for eye problems and icterus.  Respiratory:  Negative for chest tightness, cough and shortness of breath.   Cardiovascular:  Negative for chest pain, leg swelling and palpitations.  Gastrointestinal:  Negative for abdominal distention, abdominal pain, constipation, diarrhea, nausea and vomiting.  Endocrine: Negative for hot flashes.  Genitourinary:  Negative for difficulty urinating.   Musculoskeletal:  Positive for myalgias. Negative for arthralgias.  Skin:  Negative for itching and rash.  Neurological:  Negative for dizziness, extremity weakness, headaches and numbness.  Hematological:  Negative for adenopathy. Does not bruise/bleed easily.  Psychiatric/Behavioral:  Negative for depression. The patient is not nervous/anxious.       PHYSICAL EXAMINATION   Onc Performance Status - 08/06/22 1001       ECOG Perf Status   ECOG Perf Status Restricted in physically strenuous activity but ambulatory and able to carry out work of a light or sedentary nature, e.g., light house work, office work      KPS SCALE   KPS % SCORE Able to carry on normal activity, minor s/s of disease             Vitals:   08/06/22 0959  BP: 133/61  Pulse: 67  Resp: 18  Temp: 98.1 F (36.7 C)  SpO2: 100%    Physical Exam Constitutional:      General: She is not in acute distress.    Appearance: Normal appearance. She is not toxic-appearing.  HENT:     Head: Normocephalic and atraumatic.  Eyes:     General: No scleral icterus. Cardiovascular:     Rate and Rhythm: Normal rate and regular rhythm.     Pulses: Normal pulses.     Heart sounds: Normal heart sounds.  Pulmonary:     Effort: Pulmonary effort is normal.     Breath sounds: Normal breath sounds.  Chest:     Comments: Right  breast is benign, left breast s/p lumpectomy and radiation, breast lymphedema noted, slight swelling and erythema, no warmth noted.  Mild TTP, no exquisite TTP Abdominal:     General: Abdomen is flat. Bowel sounds are normal. There is no distension.     Palpations: Abdomen is soft.     Tenderness: There is no abdominal tenderness.  Musculoskeletal:  General: No swelling.     Cervical back: Neck supple.  Lymphadenopathy:     Cervical: No cervical adenopathy.  Skin:    General: Skin is warm and dry.     Findings: No rash.  Neurological:     General: No focal deficit present.     Mental Status: She is alert.  Psychiatric:        Mood and Affect: Mood normal.        Behavior: Behavior normal.     LABORATORY DATA:  CBC    Component Value Date/Time   WBC 4.6 08/06/2022 0937   RBC 3.97 08/06/2022 0937   HGB 12.1 08/06/2022 0937   HGB 12.1 06/25/2022 0820   HCT 34.6 (L) 08/06/2022 0937   HCT 36.8 06/26/2022 1120   PLT 111 (L) 08/06/2022 0937   PLT 116 (L) 06/25/2022 0820   MCV 87.2 08/06/2022 0937   MCH 30.5 08/06/2022 0937   MCHC 35.0 08/06/2022 0937   RDW 12.2 08/06/2022 0937   LYMPHSABS 1.1 08/06/2022 0937   MONOABS 0.4 08/06/2022 0937   EOSABS 0.1 08/06/2022 0937   BASOSABS 0.0 08/06/2022 0937    CMP     Component Value Date/Time   NA 139 06/25/2022 0820   K 4.5 06/25/2022 0820   CL 105 06/25/2022 0820   CO2 28 06/25/2022 0820   GLUCOSE 101 (H) 06/25/2022 0820   BUN 19 06/25/2022 0820   CREATININE 0.70 06/25/2022 0820   CREATININE 0.76 11/03/2019 0921   CALCIUM 9.7 06/25/2022 0820   PROT 6.4 (L) 06/25/2022 0820   ALBUMIN 4.1 06/25/2022 0820   AST 15 06/25/2022 0820   ALT 11 06/25/2022 0820   ALKPHOS 85 06/25/2022 0820   BILITOT 0.6 06/25/2022 0820   GFRNONAA >60 06/25/2022 0820   GFRNONAA 78 11/03/2019 0921   GFRAA 91 11/03/2019 0921       ASSESSMENT and THERAPY PLAN:   Malignant neoplasm of upper-inner quadrant of left breast in female,  estrogen receptor positive (HCC) Patricia Marshall is a 77 year old woman with stage IIa ER positive, HER2 positive invasive ductal carcinoma of the left breast diagnosed in August 2023 status post left breast lumpectomy followed by adjuvant chemotherapy with Taxol and Herceptin, adjuvant radiation therapy, maintenance trastuzumab, and anastrozole.  Current treatment: Herceptin and anastrozole  Stage IIa left breast invasive ductal carcinoma: She continues on treatment with anastrozole and Herceptin.  She is tolerating this treatment well.  She will continue it. Left breast lymphedema: She does not have any signs of recurrence or infection on her breast exam.  I recommended that she continue to massage her breast.  She has completed physical therapy. Myalgias: This could be related to her anastrozole.  She is drinking plenty of water.  I recommended that she resume her magnesium supplement in the evening to see if this may help. Nutrition questions: We placed a referral to the [facility] health cancer Center nutrition so that they can follow-up with [facility] during her treatment appointments. At risk for heart failure: Her most recent echo was normal and stable compared to her previous 1.  Her next echocardiogram is due in August 2024.  Patricia Marshall will return in 3 weeks for follow-up with Dr. Al Pimple and her next infusion.    All questions were answered. The patient knows to call the clinic with any problems, questions or concerns. We can certainly see the patient much sooner if necessary.  Total encounter time:30 minutes*in face-to-face visit time, chart review, lab review, care coordination, order entry,  and documentation of the encounter time.    Lillard Anes, NP 08/06/22 10:32 AM Medical Oncology and Hematology [facility] [address] [place], [place] [address] Tel. [phone]    Fax. [phone]  *Total Encounter Time as defined by the Centers for Medicare and Medicaid Services includes, in  addition to the face-to-face time of a patient visit (documented in the note above) non-face-to-face time: obtaining and reviewing outside history, ordering and reviewing medications, tests or procedures, care coordination (communications with other health care professionals or caregivers) and documentation in the medical record.

## 2022-08-13 ENCOUNTER — Telehealth: Payer: Self-pay | Admitting: Adult Health

## 2022-08-13 NOTE — Telephone Encounter (Signed)
Scheduled appointment per scheduling message. Patient is aware of the made appointment. 

## 2022-08-22 ENCOUNTER — Inpatient Hospital Stay: Payer: Medicare HMO | Attending: Hematology and Oncology | Admitting: Nutrition

## 2022-08-22 DIAGNOSIS — Z5112 Encounter for antineoplastic immunotherapy: Secondary | ICD-10-CM | POA: Insufficient documentation

## 2022-08-22 DIAGNOSIS — C50212 Malignant neoplasm of upper-inner quadrant of left female breast: Secondary | ICD-10-CM | POA: Insufficient documentation

## 2022-08-22 DIAGNOSIS — Z170 Estrogen receptor positive status [ER+]: Secondary | ICD-10-CM | POA: Insufficient documentation

## 2022-08-22 DIAGNOSIS — G629 Polyneuropathy, unspecified: Secondary | ICD-10-CM | POA: Insufficient documentation

## 2022-08-22 DIAGNOSIS — Z79811 Long term (current) use of aromatase inhibitors: Secondary | ICD-10-CM | POA: Insufficient documentation

## 2022-08-22 DIAGNOSIS — Z79899 Other long term (current) drug therapy: Secondary | ICD-10-CM | POA: Insufficient documentation

## 2022-08-22 DIAGNOSIS — D696 Thrombocytopenia, unspecified: Secondary | ICD-10-CM | POA: Insufficient documentation

## 2022-08-22 NOTE — Progress Notes (Signed)
77 year old female diagnosed with breast cancer and followed by Dr. Al Pimple. She is receiving anastrozole/Herceptin.  Past medical history includes DVT, vitamin D deficiency, hypercholesterolemia.  Medications include Arimidex, vitamin D.  Labs include glucose 115 and platelets 111 on May 21.  Height: 5 feet 5 inches. Weight: 126 pounds. Usual body weight: Approximately 125 pounds. BMI: 28.97.  Patient is interested in additional information about healthy eating during and after breast cancer treatment.  States she generally has always eaten a very healthy diet.  Would like more information on high-protein foods.  She has low platelets and wonders if there are foods that can help increase platelets.  Nutrition diagnosis:  Food and nutrition related knowledge deficit related to breast cancer and associated treatments as evidenced by no prior need for nutrition related information.  Intervention: Educated on consuming a healthy plant-based diet with adequate protein. Reviewed high-protein foods. Encouraged weight maintenance. Provided nutrition facts sheets.  Monitoring, evaluation, goals: Patient will tolerate plant-based diet to minimize risk for recurrence.  Nutrition diagnosis resolved.  No follow-up scheduled.  **Disclaimer: This note was dictated with voice recognition software. Similar sounding words can inadvertently be transcribed and this note may contain transcription errors which may not have been corrected upon publication of note.**

## 2022-08-24 ENCOUNTER — Telehealth: Payer: Self-pay | Admitting: Hematology and Oncology

## 2022-08-26 ENCOUNTER — Inpatient Hospital Stay: Payer: Medicare HMO | Admitting: Hematology and Oncology

## 2022-08-26 ENCOUNTER — Inpatient Hospital Stay: Payer: Medicare HMO

## 2022-08-26 ENCOUNTER — Other Ambulatory Visit: Payer: Self-pay

## 2022-08-26 DIAGNOSIS — C50212 Malignant neoplasm of upper-inner quadrant of left female breast: Secondary | ICD-10-CM

## 2022-08-26 DIAGNOSIS — Z170 Estrogen receptor positive status [ER+]: Secondary | ICD-10-CM | POA: Diagnosis not present

## 2022-08-26 DIAGNOSIS — Z79899 Other long term (current) drug therapy: Secondary | ICD-10-CM | POA: Diagnosis not present

## 2022-08-26 DIAGNOSIS — D696 Thrombocytopenia, unspecified: Secondary | ICD-10-CM | POA: Diagnosis not present

## 2022-08-26 DIAGNOSIS — Z5112 Encounter for antineoplastic immunotherapy: Secondary | ICD-10-CM | POA: Diagnosis not present

## 2022-08-26 DIAGNOSIS — G629 Polyneuropathy, unspecified: Secondary | ICD-10-CM | POA: Diagnosis not present

## 2022-08-26 DIAGNOSIS — Z95828 Presence of other vascular implants and grafts: Secondary | ICD-10-CM

## 2022-08-26 DIAGNOSIS — Z79811 Long term (current) use of aromatase inhibitors: Secondary | ICD-10-CM | POA: Diagnosis not present

## 2022-08-26 LAB — CMP (CANCER CENTER ONLY)
ALT: 9 U/L (ref 0–44)
AST: 14 U/L — ABNORMAL LOW (ref 15–41)
Albumin: 4.2 g/dL (ref 3.5–5.0)
Alkaline Phosphatase: 84 U/L (ref 38–126)
Anion gap: 6 (ref 5–15)
BUN: 23 mg/dL (ref 8–23)
CO2: 28 mmol/L (ref 22–32)
Calcium: 9.7 mg/dL (ref 8.9–10.3)
Chloride: 104 mmol/L (ref 98–111)
Creatinine: 0.68 mg/dL (ref 0.44–1.00)
GFR, Estimated: >60 mL/min (ref >60)
Glucose, Bld: 104 mg/dL — ABNORMAL HIGH (ref 70–99)
Potassium: 4.5 mmol/L (ref 3.5–5.1)
Sodium: 138 mmol/L (ref 135–145)
Total Bilirubin: 0.7 mg/dL (ref 0.3–1.2)
Total Protein: 6.9 g/dL (ref 6.5–8.1)

## 2022-08-26 LAB — CBC WITH DIFFERENTIAL/PLATELET
Abs Immature Granulocytes: 0.01 10*3/uL (ref 0.00–0.07)
Basophils Absolute: 0 10*3/uL (ref 0.0–0.1)
Basophils Relative: 0 %
Eosinophils Absolute: 0.1 10*3/uL (ref 0.0–0.5)
Eosinophils Relative: 2 %
HCT: 36.2 % (ref 36.0–46.0)
Hemoglobin: 12.2 g/dL (ref 12.0–15.0)
Immature Granulocytes: 0 %
Lymphocytes Relative: 21 %
Lymphs Abs: 1 10*3/uL (ref 0.7–4.0)
MCH: 29.4 pg (ref 26.0–34.0)
MCHC: 33.7 g/dL (ref 30.0–36.0)
MCV: 87.2 fL (ref 80.0–100.0)
Monocytes Absolute: 0.5 10*3/uL (ref 0.1–1.0)
Monocytes Relative: 10 %
Neutro Abs: 3.3 10*3/uL (ref 1.7–7.7)
Neutrophils Relative %: 67 %
Platelets: 114 10*3/uL — ABNORMAL LOW (ref 150–400)
RBC: 4.15 MIL/uL (ref 3.87–5.11)
RDW: 12.4 % (ref 11.5–15.5)
WBC: 4.8 10*3/uL (ref 4.0–10.5)
nRBC: 0 % (ref 0.0–0.2)

## 2022-08-26 MED ORDER — LORATADINE 10 MG PO TABS
10.0000 mg | ORAL_TABLET | Freq: Every day | ORAL | Status: DC
  Administered 2022-08-26: 10 mg via ORAL
  Filled 2022-08-26: qty 1

## 2022-08-26 MED ORDER — HEPARIN SOD (PORK) LOCK FLUSH 100 UNIT/ML IV SOLN
500.0000 [IU] | Freq: Once | INTRAVENOUS | Status: AC | PRN
  Administered 2022-08-26: 500 [IU]

## 2022-08-26 MED ORDER — SODIUM CHLORIDE 0.9% FLUSH
10.0000 mL | Freq: Once | INTRAVENOUS | Status: AC
  Administered 2022-08-26: 10 mL

## 2022-08-26 MED ORDER — ACETAMINOPHEN 325 MG PO TABS
650.0000 mg | ORAL_TABLET | Freq: Once | ORAL | Status: AC
  Administered 2022-08-26: 650 mg via ORAL
  Filled 2022-08-26: qty 2

## 2022-08-26 MED ORDER — SODIUM CHLORIDE 0.9% FLUSH
10.0000 mL | INTRAVENOUS | Status: DC | PRN
  Administered 2022-08-26: 10 mL

## 2022-08-26 MED ORDER — TRASTUZUMAB-ANNS CHEMO 150 MG IV SOLR
6.0000 mg/kg | Freq: Once | INTRAVENOUS | Status: AC
  Administered 2022-08-26: 336 mg via INTRAVENOUS
  Filled 2022-08-26: qty 16

## 2022-08-26 MED ORDER — SODIUM CHLORIDE 0.9 % IV SOLN: Freq: Once | INTRAVENOUS | Status: AC

## 2022-08-26 NOTE — Patient Instructions (Signed)
[facility]  Discharge Instructions: Thank you for choosing [facility] to provide your oncology and hematology care.   If you have a lab appointment with the Cancer Center, please go directly to the Cancer Center and check in at the registration area.   Wear comfortable clothing and clothing appropriate for easy access to any Portacath or PICC line.   We strive to give you quality time with your provider. You may need to reschedule your appointment if you arrive late (15 or more minutes).  Arriving late affects you and other patients whose appointments are after yours.  Also, if you miss three or more appointments without notifying the office, you may be dismissed from the clinic at the provider's discretion.      For prescription refill requests, have your pharmacy contact our office and allow 72 hours for refills to be completed.    Today you received the following chemotherapy and/or immunotherapy agents: Kanjinti.      To help prevent nausea and vomiting after your treatment, we encourage you to take your nausea medication as directed.  BELOW ARE SYMPTOMS THAT SHOULD BE REPORTED IMMEDIATELY: *FEVER GREATER THAN 100.4 F (38 C) OR HIGHER *CHILLS OR SWEATING *NAUSEA AND VOMITING THAT IS NOT CONTROLLED WITH YOUR NAUSEA MEDICATION *UNUSUAL SHORTNESS OF BREATH *UNUSUAL BRUISING OR BLEEDING *URINARY PROBLEMS (pain or burning when urinating, or frequent urination) *BOWEL PROBLEMS (unusual diarrhea, constipation, pain near the anus) TENDERNESS IN MOUTH AND THROAT WITH OR WITHOUT PRESENCE OF ULCERS (sore throat, sores in mouth, or a toothache) UNUSUAL RASH, SWELLING OR PAIN  UNUSUAL VAGINAL DISCHARGE OR ITCHING   Items with * indicate a potential emergency and should be followed up as soon as possible or go to the Emergency Department if any problems should occur.  Please show the CHEMOTHERAPY ALERT CARD or IMMUNOTHERAPY ALERT CARD at  check-in to the Emergency Department and triage nurse.  Should you have questions after your visit or need to cancel or reschedule your appointment, please contact [facility]  Dept: [phone]  and follow the prompts.  Office hours are 8:00 a.m. to 4:30 p.m. Monday - Friday. Please note that voicemails left after 4:00 p.m. may not be returned until the following business day.  We are closed weekends and major holidays. You have access to a nurse at all times for urgent questions. Please call the main number to the clinic Dept: [phone] and follow the prompts.   For any non-urgent questions, you may also contact your provider using MyChart. We now offer e-Visits for anyone 18 and older to request care online for non-urgent symptoms. For details visit [email or web address].   Also download the MyChart app! Go to the app store, search "MyChart", open the app, select [facility], and log in with your MyChart username and password.   

## 2022-08-26 NOTE — Assessment & Plan Note (Signed)
This is a very pleasant 77 year old postmenopausal female patient with newly diagnosed left breast upper inner quadrant invasive ductal carcinoma, grade 2, ER +90% strong staining, PR 0% negative, HER2 positive by IHC 3+, Ki-67 of 30% referred to breast [facility] for additional recommendations.   She is tolerating adjuvant chemotherapy very well except for mildly worsening neuropathy. She completed adjuvant Taxol and Herceptin, omitted the last couple dose of paclitaxel given worsening neuropathy.  She is now on adjuvant Herceptin.  She is also tolerating anastrozole extremely well except for some bony aches intermittently which are overall tolerable.  No major concerns or adverse effects reported today.  No major concerns on physical exam.  She would like to wait on the mammogram since her left breast this is still sore and would like to do it in September when she is done with the Herceptin. No concerns on physical exam. She has chronic left lower extremity swelling from DVT and post thrombotic syndrome.  No change. Mild thrombocytopenia, no clear etiology, we will continue to monitor. Return to clinic every 3 weeks, okay to alternate between me and the APP team.

## 2022-08-26 NOTE — Progress Notes (Signed)
[name] Cancer Follow up:    Patricia Frees, NP [address] [place] [address]   DIAGNOSIS:  Cancer Staging  Malignant neoplasm of upper-inner quadrant of left breast in female, estrogen receptor positive (HCC) Staging form: Breast, AJCC 8th Edition - Pathologic: Stage IIA (pT2, pN0, cM0, G3, ER+, PR-, HER2+) - Signed by Rachel Moulds, MD on 11/26/2021 Histologic grading system: 3 grade system   SUMMARY OF ONCOLOGIC HISTORY: Oncology History  Malignant neoplasm of upper-inner quadrant of left breast in female, estrogen receptor positive (HCC)  09/20/2021 Mammogram   Screening mammogram showed extremely dense breasts and a possible mass in the left breast warranting further evaluation.  Breast density category is D Diagnostic mammogram of the breast showed indeterminate mass in the 11:00 location of the left breast.  Indeterminate mass in the lower anterior left axilla.   10/04/2021 Pathology Results   Pathology showed grade 2 invasive ductal carcinoma, left axillary soft tissue needle core biopsy showed benign breast tissue with patchy changes prognostics from the breast tumor showed ER 90% positive strong staining PR 0%, negative, Ki-67 of 30% and HER2 positive for IHC 3+   10/19/2021 Initial Diagnosis   Malignant neoplasm of upper-inner quadrant of left breast in female, estrogen receptor positive (HCC)   10/23/2021 Cancer Staging   Staging form: Breast, AJCC 8th Edition - Pathologic: Stage IIA (pT2, pN0, cM0, G3, ER+, PR-, HER2+) - Signed by Rachel Moulds, MD on 11/26/2021 Histologic grading system: 3 grade system   11/10/2021 Genetic Testing   Negative hereditary cancer genetic testing: no pathogenic variants detected in [facility] CancerNext-Expanded +RNA Panel.  Report date is November 10, 2021.   The CancerNext-Expanded gene panel offered by [facility] and includes sequencing, rearrangement, and RNA analysis for the following 77 genes: AIP, ALK, APC,  ATM, AXIN2, BAP1, BARD1, BLM, BMPR1A, BRCA1, BRCA2, BRIP1, CDC73, CDH1, CDK4, CDKN1B, CDKN2A, CHEK2, CTNNA1, DICER1, FANCC, FH, FLCN, GALNT12, KIF1B, LZTR1, MAX, MEN1, MET, MLH1, MSH2, MSH3, MSH6, MUTYH, NBN, NF1, NF2, NTHL1, PALB2, PHOX2B, PMS2, POT1, PRKAR1A, PTCH1, PTEN, RAD51C, RAD51D, RB1, RECQL, RET, SDHA, SDHAF2, SDHB, SDHC, SDHD, SMAD4, SMARCA4, SMARCB1, SMARCE1, STK11, SUFU, TMEM127, TP53, TSC1, TSC2, VHL and XRCC2 (sequencing and deletion/duplication); EGFR, EGLN1, HOXB13, KIT, MITF, PDGFRA, POLD1, and POLE (sequencing only); EPCAM and GREM1 (deletion/duplication only).    11/15/2021 Surgery   Left breast lumpectomy: 2.4 cm grade 3 invasive ductal carcinoma, margins -2 sentinel lymph nodes negative.   12/10/2021 -  Chemotherapy   Patient is on Treatment Plan : BREAST Paclitaxel + Trastuzumab q7d / Trastuzumab q21d     03/26/2022 - 04/22/2022 Radiation Therapy   03/26/2022 through 04/22/2022 Site Technique Total Dose (Gy) Dose per Fx (Gy) Completed Fx Beam Energies  Breast, Left: Breast_L 3D 42.56/42.56 2.66 16/16 6X  Breast, Left: Breast_L_Bst 3D 8/8 2 4/4 6X      CURRENT THERAPY: Anastrozole/Herceptin  INTERVAL HISTORY:  Patricia Marshall 77 y.o. female returns for f/u prior to receiving Herceptin.  She is tolerating this well.  Her most recent echo occurred on 07/31/2022 and demonstrated a LVEF of 60-65%.    She also underwent bone density testing on May 20, 2022 that demonstrated osteopenia with a T-score of -2.3 in the right femur.  Patricia Marshall continues on anastrozole daily with good tolerance. She is experiencing muscle aches particularly at night and wonders if this could be related to the Anastrozole.   Breast lymphedema--she is doing exercises recommended by the PT  She has overall been doing  quite well.  She is recovering well, neuropathy is intermittently bothersome.  She is able to do all her activities of daily living.  She reports some intermittent swelling in the breasts and the  erythema which she wonders if likely related to the compression bra since it gets better when she does not use them.  She is not willing to do her mammogram in July, she states the breast is a bit too sore hence she would like to defer to September.  Rest of the pertinent 10 point ROS reviewed and negative  Patient Active Problem List   Diagnosis Date Noted   Acute deep vein thrombosis (DVT) of left lower extremity (HCC) 04/23/2022   Port-A-Cath in place 02/04/2022   Genetic testing 12/07/2021   Malignant neoplasm of upper-inner quadrant of left breast in female, estrogen receptor positive (HCC) 10/19/2021   Piriformis syndrome of left side 07/12/2015   Degenerative arthritis of left knee 07/12/2015   BEE STING REACTION, LOCAL 10/24/2006    is allergic to other, ampicillin, and anesthesia [id number] [propofol].  MEDICAL HISTORY: Past Medical History:  Diagnosis Date   Arthritis    Breast cancer (HCC)    Chicken pox    High cholesterol    PONV (postoperative nausea and vomiting)    Vitamin D deficiency     SURGICAL HISTORY: Past Surgical History:  Procedure Laterality Date   APPENDECTOMY  1959   BACK SURGERY     1996   BREAST LUMPECTOMY WITH AXILLARY LYMPH NODE BIOPSY Left 11/15/2021   Procedure: LEFT BREAST LUMPECTOMY WITH AXILLARY SENTINEL LYMPH NODE BIOPSY;  Surgeon: Emelia Loron, MD;  Location: [facility];  Service: General;  Laterality: Left;   PORTACATH PLACEMENT Right 11/15/2021   Procedure: INSERTION PORT-A-CATH;  Surgeon: Emelia Loron, MD;  Location: [facility];  Service: General;  Laterality: Right;   TONSILLECTOMY AND ADENOIDECTOMY  1954    SOCIAL HISTORY: Social History   Socioeconomic History   Marital status: Married    Spouse name: Not on file   Number of children: Not on file   Years of education: Not on file   Highest education level: Not on file  Occupational History   Not on file  Tobacco Use   Smoking status:  Never   Smokeless tobacco: Never  Vaping Use   Vaping Use: Never used  Substance and Sexual Activity   Alcohol use: Yes    Alcohol/week: 1.0 standard drink of alcohol    Types: 1 Glasses of wine per week   Drug use: Never   Sexual activity: Not on file  Other Topics Concern   Not on file  Social History Narrative   Married    Retired    International aid/development worker of Corporate investment banker Strain: Low Risk  (09/03/2021)   Overall Financial Resource Strain (CARDIA)    Difficulty of Paying Living Expenses: Not hard at all  Food Insecurity: No Food Insecurity (09/03/2021)   Hunger Vital Sign    Worried About Running Out of Food in the Last Year: Never true    Ran Out of Food in the Last Year: Never true  Transportation Needs: No Transportation Needs (09/03/2021)   PRAPARE - Administrator, Civil Service (Medical): No    Lack of Transportation (Non-Medical): No  Physical Activity: Sufficiently Active (09/03/2021)   Exercise Vital Sign    Days of Exercise per Week: 4 days    Minutes of Exercise per Session: 120 min  Stress: No Stress Concern Present (09/03/2021)   [employer] of Occupational Health - Occupational Stress Questionnaire    Feeling of Stress : Not at all  Social Connections: Socially Integrated (09/03/2021)   Social Connection and Isolation Panel [NHANES]    Frequency of Communication with Friends and Family: More than three times a week    Frequency of Social Gatherings with Friends and Family: More than three times a week    Attends Religious Services: More than 4 times per year    Active Member of [employer] or Organizations: Yes    Attends Engineer, structural: More than 4 times per year    Marital Status: Married  Catering manager Violence: Not At Risk (09/03/2021)   Humiliation, Afraid, Rape, and Kick questionnaire    Fear of Current or Ex-Partner: No    Emotionally Abused: No    Physically Abused: No    Sexually Abused: No    FAMILY  HISTORY: Family History  Problem Relation Age of Onset   Stomach cancer Mother 50   Heart attack Father 73   Cancer Sister        dx mid 21s; unknown primary w/ mets; treated like ovarian cancer   Heart attack Brother 58   Heart attack Brother 55    Review of Systems  Constitutional:  Negative for appetite change, chills, fatigue, fever and unexpected weight change.  HENT:   Negative for hearing loss, lump/mass and trouble swallowing.   Eyes:  Negative for eye problems and icterus.  Respiratory:  Negative for chest tightness, cough and shortness of breath.   Cardiovascular:  Negative for chest pain, leg swelling and palpitations.  Gastrointestinal:  Negative for abdominal distention, abdominal pain, constipation, diarrhea, nausea and vomiting.  Endocrine: Negative for hot flashes.  Genitourinary:  Negative for difficulty urinating.   Musculoskeletal:  Negative for arthralgias and myalgias.  Skin:  Negative for itching and rash.  Neurological:  Negative for dizziness, extremity weakness, headaches and numbness.  Hematological:  Negative for adenopathy. Does not bruise/bleed easily.  Psychiatric/Behavioral:  Negative for depression. The patient is not nervous/anxious.       PHYSICAL EXAMINATION     Vitals:   08/26/22 0906  BP: 131/61  Pulse: 64  Resp: 16  Temp: 98.4 F (36.9 C)  SpO2: 99%    Physical Exam Constitutional:      General: She is not in acute distress.    Appearance: Normal appearance. She is not toxic-appearing.  HENT:     Head: Normocephalic and atraumatic.  Eyes:     General: No scleral icterus. Cardiovascular:     Rate and Rhythm: Normal rate and regular rhythm.     Pulses: Normal pulses.     Heart sounds: Normal heart sounds.  Pulmonary:     Effort: Pulmonary effort is normal.     Breath sounds: Normal breath sounds.  Chest:     Comments: Right breast is benign, left breast s/p lumpectomy and radiation, breast lymphedema noted, slight swelling  and erythema, no warmth noted.   Abdominal:     General: Abdomen is flat. Bowel sounds are normal. There is no distension.     Palpations: Abdomen is soft.     Tenderness: There is no abdominal tenderness.  Musculoskeletal:        General: No swelling.     Cervical back: Neck supple.  Lymphadenopathy:     Cervical: No cervical adenopathy.  Skin:    General: Skin is warm and dry.  Findings: No rash.  Neurological:     General: No focal deficit present.     Mental Status: She is alert.  Psychiatric:        Mood and Affect: Mood normal.        Behavior: Behavior normal.     LABORATORY DATA:  CBC    Component Value Date/Time   WBC 4.8 08/26/2022 0822   RBC 4.15 08/26/2022 0822   HGB 12.2 08/26/2022 0822   HGB 12.1 06/25/2022 0820   HCT 36.2 08/26/2022 0822   HCT 36.8 06/26/2022 1120   PLT 114 (L) 08/26/2022 0822   PLT 116 (L) 06/25/2022 0820   MCV 87.2 08/26/2022 0822   MCH 29.4 08/26/2022 0822   MCHC 33.7 08/26/2022 0822   RDW 12.4 08/26/2022 0822   LYMPHSABS 1.0 08/26/2022 0822   MONOABS 0.5 08/26/2022 0822   EOSABS 0.1 08/26/2022 0822   BASOSABS 0.0 08/26/2022 0822    CMP     Component Value Date/Time   NA 138 08/26/2022 0822   K 4.5 08/26/2022 0822   CL 104 08/26/2022 0822   CO2 28 08/26/2022 0822   GLUCOSE 104 (H) 08/26/2022 0822   BUN 23 08/26/2022 0822   CREATININE 0.68 08/26/2022 0822   CREATININE 0.76 11/03/2019 0921   CALCIUM 9.7 08/26/2022 0822   PROT 6.9 08/26/2022 0822   ALBUMIN 4.2 08/26/2022 0822   AST 14 (L) 08/26/2022 0822   ALT 9 08/26/2022 0822   ALKPHOS 84 08/26/2022 0822   BILITOT 0.7 08/26/2022 0822   GFRNONAA >60 08/26/2022 0822   GFRNONAA 78 11/03/2019 0921   GFRAA 91 11/03/2019 0921       ASSESSMENT and THERAPY PLAN:   Malignant neoplasm of upper-inner quadrant of left breast in female, estrogen receptor positive (HCC) This is a very pleasant 77 year old postmenopausal female patient with newly diagnosed left breast  upper inner quadrant invasive ductal carcinoma, grade 2, ER +90% strong staining, PR 0% negative, HER2 positive by IHC 3+, Ki-67 of 30% referred to breast [facility] for additional recommendations.   She is tolerating adjuvant chemotherapy very well except for mildly worsening neuropathy. She completed adjuvant Taxol and Herceptin, omitted the last couple dose of paclitaxel given worsening neuropathy.  She is now on adjuvant Herceptin.  She is also tolerating anastrozole extremely well except for some bony aches intermittently which are overall tolerable.  No major concerns or adverse effects reported today.  No major concerns on physical exam.  She would like to wait on the mammogram since her left breast this is still sore and would like to do it in September when she is done with the Herceptin. No concerns on physical exam. She has chronic left lower extremity swelling from DVT and post thrombotic syndrome.  No change. Mild thrombocytopenia, no clear etiology, we will continue to monitor. Return to clinic every 3 weeks, okay to alternate between me and the APP team.    All questions were answered. The patient knows to call the clinic with any problems, questions or concerns. We can certainly see the patient much sooner if necessary.  Total encounter time:30 minutes*in face-to-face visit time, chart review, lab review, care coordination, order entry, and documentation of the encounter time.  *Total Encounter Time as defined by the Centers for Medicare and Medicaid Services includes, in addition to the face-to-face time of a patient visit (documented in the note above) non-face-to-face time: obtaining and reviewing outside history, ordering and reviewing medications, tests or procedures, care coordination (communications with  other health care professionals or caregivers) and documentation in the medical record.

## 2022-09-02 ENCOUNTER — Ambulatory Visit: Payer: Medicare HMO | Attending: General Surgery

## 2022-09-02 VITALS — Wt 126.2 lb

## 2022-09-02 DIAGNOSIS — Z483 Aftercare following surgery for neoplasm: Secondary | ICD-10-CM | POA: Insufficient documentation

## 2022-09-02 NOTE — Therapy (Signed)
OUTPATIENT PHYSICAL THERAPY SOZO SCREENING NOTE   Patient Name: Patricia Marshall MRN: [id number] DOB:1945/03/27, 77 y.o., female Today's Date: 09/02/2022  PCP: [name], NP REFERRING PROVIDER: Emelia Loron, MD   PT End of Session - 09/02/22 1522     Visit Number 7   # unchanged due to screen only   PT Start Time 1520    PT Stop Time 1530    PT Time Calculation (min) 10 min    Activity Tolerance Patient tolerated treatment well    Behavior During Therapy WFL for tasks assessed/performed             Past Medical History:  Diagnosis Date   Arthritis    Breast cancer (HCC)    Chicken pox    High cholesterol    PONV (postoperative nausea and vomiting)    Vitamin D deficiency    Past Surgical History:  Procedure Laterality Date   APPENDECTOMY  1959   BACK SURGERY     1996   BREAST LUMPECTOMY WITH AXILLARY LYMPH NODE BIOPSY Left 11/15/2021   Procedure: LEFT BREAST LUMPECTOMY WITH AXILLARY SENTINEL LYMPH NODE BIOPSY;  Surgeon: Emelia Loron, MD;  Location: [facility];  Service: General;  Laterality: Left;   PORTACATH PLACEMENT Right 11/15/2021   Procedure: INSERTION PORT-A-CATH;  Surgeon: Emelia Loron, MD;  Location: [facility];  Service: General;  Laterality: Right;   TONSILLECTOMY AND ADENOIDECTOMY  1954   Patient Active Problem List   Diagnosis Date Noted   Acute deep vein thrombosis (DVT) of left lower extremity (HCC) 04/23/2022   Port-A-Cath in place 02/04/2022   Genetic testing 12/07/2021   Malignant neoplasm of upper-inner quadrant of left breast in female, estrogen receptor positive (HCC) 10/19/2021   Piriformis syndrome of left side 07/12/2015   Degenerative arthritis of left knee 07/12/2015   BEE STING REACTION, LOCAL 10/24/2006    REFERRING DIAG: left breast cancer at risk for lymphedema  THERAPY DIAG:  Aftercare following surgery for neoplasm  PERTINENT HISTORY: Patient was diagnosed on 09/20/2021  with left grade 2 invasive ductal carcinoma breast cancer. She underwent a left lumpectomy and sentinel node biopsy (2 negative nodes) on 11/15/2021. 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 "My Lt breast feels so much better since doing physical therapy and learning MLD. My breast doesn't feel like a lead weight anymore which is great."    PAIN:  Are you having pain? No  SOZO SCREENING: Patient was assessed today using the SOZO machine to determine the lymphedema index score. This was compared to her baseline score. It was determined that she is within the recommended range when compared to her baseline and no further action is needed at this time. She will continue SOZO screenings. These are done every 3 months for 2 years post operatively followed by every 6 months for 2 years, and then annually.   L-DEX FLOWSHEETS - 09/02/22 1500       L-DEX LYMPHEDEMA SCREENING   Measurement Type Unilateral    L-DEX MEASUREMENT EXTREMITY Upper Extremity    POSITION  Standing    DOMINANT SIDE Right    At Risk Side Left    BASELINE SCORE (UNILATERAL) -3    L-DEX SCORE (UNILATERAL) -1.7    VALUE CHANGE (UNILAT) 1.3  Patricia Marshall, PTA 09/02/2022, 3:30 PM

## 2022-09-11 ENCOUNTER — Ambulatory Visit (INDEPENDENT_AMBULATORY_CARE_PROVIDER_SITE_OTHER): Payer: Medicare HMO

## 2022-09-11 VITALS — BP 120/60 | HR 62 | Temp 98.8°F | Ht 65.0 in | Wt 124.0 lb

## 2022-09-11 DIAGNOSIS — Z0000 Encounter for general adult medical examination without abnormal findings: Secondary | ICD-10-CM

## 2022-09-11 NOTE — Patient Instructions (Addendum)
Patricia Marshall , Thank you for taking time to come for your Medicare Wellness Visit. I appreciate your ongoing commitment to your health goals. Please review the following plan we discussed and let me know if I can assist you in the future.   These are the goals we discussed:  Goals       Patient Stated (pt-stated)      I would like to maintain my current diet and level of fitness.        This is a list of the screening recommended for you and due dates:  Health Maintenance  Topic Date Due   DTaP/Tdap/Td vaccine (1 - Tdap) Never done   COVID-19 Vaccine (4 - 2023-24 season) 09/27/2022*   Zoster (Shingles) Vaccine (1 of 2) 12/12/2022*   Pneumonia Vaccine (1 of 1 - PCV) 09/11/2023*   Colon Cancer Screening  09/11/2023*   Flu Shot  10/17/2022   Medicare Annual Wellness Visit  09/11/2023   DEXA scan (bone density measurement)  Completed   Hepatitis C Screening  Completed   HPV Vaccine  Aged Out  *Topic was postponed. The date shown is not the original due date.    Advanced directives: Please bring a copy of your health care power of attorney and living will to the office to be added to your chart at your convenience.   Conditions/risks identified: None  Next appointment: Follow up in one year for your annual wellness visit    Preventive Care 65 Years and Older, Female Preventive care refers to lifestyle choices and visits with your health care provider that can promote health and wellness. What does preventive care include? A yearly physical exam. This is also called an annual well check. Dental exams once or twice a year. Routine eye exams. Ask your health care provider how often you should have your eyes checked. Personal lifestyle choices, including: Daily care of your teeth and gums. Regular physical activity. Eating a healthy diet. Avoiding tobacco and drug use. Limiting alcohol use. Practicing safe sex. Taking low-dose aspirin every day. Taking vitamin and mineral  supplements as recommended by your health care provider. What happens during an annual well check? The services and screenings done by your health care provider during your annual well check will depend on your age, overall health, lifestyle risk factors, and family history of disease. Counseling  Your health care provider may ask you questions about your: Alcohol use. Tobacco use. Drug use. Emotional well-being. Home and relationship well-being. Sexual activity. Eating habits. History of falls. Memory and ability to understand (cognition). Work and work Astronomer. Reproductive health. Screening  You may have the following tests or measurements: Height, weight, and BMI. Blood pressure. Lipid and cholesterol levels. These may be checked every 5 years, or more frequently if you are over 11 years old. Skin check. Lung cancer screening. You may have this screening every year starting at age 69 if you have a 30-pack-year history of smoking and currently smoke or have quit within the past 15 years. Fecal occult blood test (FOBT) of the stool. You may have this test every year starting at age 33. Flexible sigmoidoscopy or colonoscopy. You may have a sigmoidoscopy every 5 years or a colonoscopy every 10 years starting at age 16. Hepatitis C blood test. Hepatitis B blood test. Sexually transmitted disease (STD) testing. Diabetes screening. This is done by checking your blood sugar (glucose) after you have not eaten for a while (fasting). You may have this done every 1-3 years. Bone  density scan. This is done to screen for osteoporosis. You may have this done starting at age 63. Mammogram. This may be done every 1-2 years. Talk to your health care provider about how often you should have regular mammograms. Talk with your health care provider about your test results, treatment options, and if necessary, the need for more tests. Vaccines  Your health care provider may recommend certain  vaccines, such as: Influenza vaccine. This is recommended every year. Tetanus, diphtheria, and acellular pertussis (Tdap, Td) vaccine. You may need a Td booster every 10 years. Zoster vaccine. You may need this after age 5. Pneumococcal 13-valent conjugate (PCV13) vaccine. One dose is recommended after age 33. Pneumococcal polysaccharide (PPSV23) vaccine. One dose is recommended after age 60. Talk to your health care provider about which screenings and vaccines you need and how often you need them. This information is not intended to replace advice given to you by your health care provider. Make sure you discuss any questions you have with your health care provider. Document Released: 03/31/2015 Document Revised: 11/22/2015 Document Reviewed: 01/03/2015 Elsevier Interactive Patient Education  2017 [place] Prevention in the Home Falls can cause injuries. They can happen to people of all ages. There are many things you can do to make your home safe and to help prevent falls. What can I do on the outside of my home? Regularly fix the edges of walkways and driveways and fix any cracks. Remove anything that might make you trip as you walk through a door, such as a raised step or threshold. Trim any bushes or trees on the path to your home. Use bright outdoor lighting. Clear any walking paths of anything that might make someone trip, such as rocks or tools. Regularly check to see if handrails are loose or broken. Make sure that both sides of any steps have handrails. Any raised decks and porches should have guardrails on the edges. Have any leaves, snow, or ice cleared regularly. Use sand or salt on walking paths during winter. Clean up any spills in your garage right away. This includes oil or grease spills. What can I do in the bathroom? Use night lights. Install grab bars by the toilet and in the tub and shower. Do not use towel bars as grab bars. Use non-skid mats or decals in  the tub or shower. If you need to sit down in the shower, use a plastic, non-slip stool. Keep the floor dry. Clean up any water that spills on the floor as soon as it happens. Remove soap buildup in the tub or shower regularly. Attach bath mats securely with double-sided non-slip rug tape. Do not have throw rugs and other things on the floor that can make you trip. What can I do in the bedroom? Use night lights. Make sure that you have a light by your bed that is easy to reach. Do not use any sheets or blankets that are too big for your bed. They should not hang down onto the floor. Have a firm chair that has side arms. You can use this for support while you get dressed. Do not have throw rugs and other things on the floor that can make you trip. What can I do in the kitchen? Clean up any spills right away. Avoid walking on wet floors. Keep items that you use a lot in easy-to-reach places. If you need to reach something above you, use a strong step stool that has a grab bar. Keep  electrical cords out of the way. Do not use floor polish or wax that makes floors slippery. If you must use wax, use non-skid floor wax. Do not have throw rugs and other things on the floor that can make you trip. What can I do with my stairs? Do not leave any items on the stairs. Make sure that there are handrails on both sides of the stairs and use them. Fix handrails that are broken or loose. Make sure that handrails are as long as the stairways. Check any carpeting to make sure that it is firmly attached to the stairs. Fix any carpet that is loose or worn. Avoid having throw rugs at the top or bottom of the stairs. If you do have throw rugs, attach them to the floor with carpet tape. Make sure that you have a light switch at the top of the stairs and the bottom of the stairs. If you do not have them, ask someone to add them for you. What else can I do to help prevent falls? Wear shoes that: Do not have high  heels. Have rubber bottoms. Are comfortable and fit you well. Are closed at the toe. Do not wear sandals. If you use a stepladder: Make sure that it is fully opened. Do not climb a closed stepladder. Make sure that both sides of the stepladder are locked into place. Ask someone to hold it for you, if possible. Clearly mark and make sure that you can see: Any grab bars or handrails. First and last steps. Where the edge of each step is. Use tools that help you move around (mobility aids) if they are needed. These include: Canes. Walkers. Scooters. Crutches. Turn on the lights when you go into a dark area. Replace any light bulbs as soon as they burn out. Set up your furniture so you have a clear path. Avoid moving your furniture around. If any of your floors are uneven, fix them. If there are any pets around you, be aware of where they are. Review your medicines with your doctor. Some medicines can make you feel dizzy. This can increase your chance of falling. Ask your doctor what other things that you can do to help prevent falls. This information is not intended to replace advice given to you by your health care provider. Make sure you discuss any questions you have with your health care provider. Document Released: 12/29/2008 Document Revised: 08/10/2015 Document Reviewed: 04/08/2014 Elsevier Interactive Patient Education  2017 [employer].

## 2022-09-11 NOTE — Progress Notes (Signed)
Subjective:   Patricia Marshall is a 77 y.o. female who presents for Medicare Annual (Subsequent) preventive examination.  Visit Complete: In person  Patient Medicare AWV questionnaire was completed by the patient on 09/10/22; I have confirmed that all information answered by patient is correct and no changes since this date.  Review of Systems     Cardiac Risk Factors include: advanced age (>65men, >71 women)     Objective:    Today's Vitals   09/11/22 1516  BP: 120/60  Pulse: 62  Temp: 98.8 F (37.1 C)  TempSrc: Oral  SpO2: 97%  Weight: 124 lb (56.2 kg)  Height: 5\' 5"  (1.651 m)   Body mass index is 20.63 kg/m.     09/11/2022    3:38 PM 08/06/2022   10:53 AM 07/01/2022    4:10 PM 04/23/2022   12:07 PM 03/19/2022    9:10 AM 02/18/2022    1:14 PM 01/21/2022   12:03 PM  Advanced Directives  Does Patient Have a Medical Advance Directive? Yes Yes Yes Yes Yes Yes Yes  Type of Estate agent of [place];Living will Healthcare Power of [place];Living will Healthcare Power of [place];Living will Healthcare Power of [place];Living will Living will;Healthcare Power of [employer] Power of [place];Living will  Does patient want to make changes to medical advance directive?   No - Patient declined No - Patient declined  No - Patient declined   Copy of Healthcare Power of Attorney in Chart? No - copy requested No - copy requested No - copy requested        Current Medications (verified) Outpatient Encounter Medications as of 09/11/2022  Medication Sig   anastrozole (ARIMIDEX) 1 MG tablet Take 1 tablet (1 mg total) by mouth daily.   Cholecalciferol (VITAMIN D) 50 MCG (2000 UT) tablet Take 2,000 Units by mouth daily.   lidocaine-prilocaine (EMLA) cream Apply to affected area once   No facility-administered encounter medications on file as of 09/11/2022.    Allergies (verified) Other, Ampicillin, and Anesthesia s-[id number] [propofol]   History: Past  Medical History:  Diagnosis Date   Arthritis    Breast cancer (HCC)    Chicken pox    High cholesterol    PONV (postoperative nausea and vomiting)    Vitamin D deficiency    Past Surgical History:  Procedure Laterality Date   APPENDECTOMY  1959   BACK SURGERY     1996   BREAST LUMPECTOMY WITH AXILLARY LYMPH NODE BIOPSY Left 11/15/2021   Procedure: LEFT BREAST LUMPECTOMY WITH AXILLARY SENTINEL LYMPH NODE BIOPSY;  Surgeon: Emelia Loron, MD;  Location: [facility];  Service: General;  Laterality: Left;   PORTACATH PLACEMENT Right 11/15/2021   Procedure: INSERTION PORT-A-CATH;  Surgeon: Emelia Loron, MD;  Location: [facility];  Service: General;  Laterality: Right;   TONSILLECTOMY AND ADENOIDECTOMY  1954   Family History  Problem Relation Age of Onset   Stomach cancer Mother 23   Heart attack Father 68   Cancer Sister        dx mid 18s; unknown primary w/ mets; treated like ovarian cancer   Heart attack Brother 74   Heart attack Brother 48   Social History   Socioeconomic History   Marital status: Married    Spouse name: Not on file   Number of children: Not on file   Years of education: Not on file   Highest education level: Not on file  Occupational History   Not  on file  Tobacco Use   Smoking status: Never   Smokeless tobacco: Never  Vaping Use   Vaping Use: Never used  Substance and Sexual Activity   Alcohol use: Yes    Alcohol/week: 1.0 standard drink of alcohol    Types: 1 Glasses of wine per week   Drug use: Never   Sexual activity: Not on file  Other Topics Concern   Not on file  Social History Narrative   Married    Retired    International aid/development worker of Corporate investment banker Strain: Low Risk  (09/11/2022)   Overall Financial Resource Strain (CARDIA)    Difficulty of Paying Living Expenses: Not hard at all  Food Insecurity: No Food Insecurity (09/11/2022)   Hunger Vital Sign    Worried About Running Out of Food  in the Last Year: Never true    Ran Out of Food in the Last Year: Never true  Transportation Needs: No Transportation Needs (09/11/2022)   PRAPARE - Administrator, Civil Service (Medical): No    Lack of Transportation (Non-Medical): No  Physical Activity: Insufficiently Active (09/11/2022)   Exercise Vital Sign    Days of Exercise per Week: 3 days    Minutes of Exercise per Session: 30 min  Stress: No Stress Concern Present (09/11/2022)   [employer] of Occupational Health - Occupational Stress Questionnaire    Feeling of Stress : Not at all  Social Connections: Socially Integrated (09/11/2022)   Social Connection and Isolation Panel [NHANES]    Frequency of Communication with Friends and Family: More than three times a week    Frequency of Social Gatherings with Friends and Family: More than three times a week    Attends Religious Services: More than 4 times per year    Active Member of [employer] or Organizations: Yes    Attends Engineer, structural: More than 4 times per year    Marital Status: Married    Tobacco Counseling Counseling given: Not Answered   Clinical Intake:  Pre-visit preparation completed: Yes  Pain : No/denies pain     BMI - recorded: 20.63 Nutritional Status: BMI of 19-24  Normal Nutritional Risks: None Diabetes: No  How often do you need to have someone help you when you read instructions, pamphlets, or other written materials from your doctor or pharmacy?: 2 - Rarely (Husband assist)  Interpreter Needed?: No  Information entered by :: Theresa Mulligan LPN   Activities of Daily Living    09/11/2022    3:37 PM 09/10/2022    5:33 PM  In your present state of health, do you have any difficulty performing the following activities:  Hearing? 0 0  Vision? 0 0  Difficulty concentrating or making decisions? 0 0  Walking or climbing stairs? 0 0  Dressing or bathing? 0 0  Doing errands, shopping? 0 0  Preparing Food and eating ? N  N  Using the Toilet? N N  In the past six months, have you accidently leaked urine? N N  Do you have problems with loss of bowel control? N N  Managing your Medications? N N  Managing your Finances? N N  Housekeeping or managing your Housekeeping? N N    Patient Care Team: [name], NP as PCP - General (Family Medicine) Emelia Loron, MD as Consulting Physician (General Surgery) Rachel Moulds, MD as Consulting Physician (Hematology and Oncology) Dorothy Puffer, MD as Consulting Physician (Radiation Oncology) Donnelly Angelica, RN as Oncology  Nurse Navigator Lu Duffel, Margretta Ditty, RN as Oncology Nurse Navigator  Indicate any recent Medical Services you may have received from other than [facility] providers in the past year (date may be approximate).     Assessment:   This is a routine wellness examination for [facility].  Hearing/Vision screen Hearing Screening - Comments:: Denies hearing difficulties   Vision Screening - Comments:: Wears rx glasses - up to date with routine eye exams with  [facility]  Dietary issues and exercise activities discussed:     Goals Addressed               This Visit's Progress     Patient Stated (pt-stated)        I would like to maintain my current diet and level of fitness.       Depression Screen    09/11/2022    3:21 PM 09/03/2021   11:32 AM 07/09/2021   10:15 AM 06/08/2020    2:19 PM 11/03/2019    8:34 AM  PHQ 2/9 Scores  PHQ - 2 Score 0 0 0 0 0  PHQ- 9 Score  0 3      Fall Risk    09/11/2022    3:37 PM 09/10/2022    5:33 PM 10/09/2021    8:25 AM 09/03/2021   11:35 AM 07/09/2021   10:15 AM  Fall Risk   Falls in the past year? 0 0 0 0 0  Number falls in past yr: 0 0 0 0 0  Injury with Fall? 0 0 0 0 0  Risk for fall due to : No Fall Risks  No Fall Risks No Fall Risks No Fall Risks  Follow up Falls prevention discussed  Falls evaluation completed  Falls evaluation completed    MEDICARE RISK AT HOME:  Medicare Risk at Home -  09/11/22 1541     Any stairs in or around the home? Yes    If so, are there any without handrails? No    Home free of loose throw rugs in walkways, pet beds, electrical cords, etc? Yes    Adequate lighting in your home to reduce risk of falls? Yes    Life alert? No    Use of a cane, walker or w/c? No    Grab bars in the bathroom? No    Shower chair or bench in shower? No    Elevated toilet seat or a handicapped toilet? No             TIMED UP AND GO:  Was the test performed?  Yes  Length of time to ambulate 10 feet: 10 sec Gait steady and fast without use of assistive device    Cognitive Function:        09/11/2022    3:38 PM 09/03/2021   11:36 AM  6CIT Screen  What Year? 0 points 0 points  What month? 0 points 0 points  What time? 0 points 0 points  Count back from 20 0 points 0 points  Months in reverse 0 points 0 points  Repeat phrase 0 points 0 points  Total Score 0 points 0 points    Immunizations Immunization History  Administered Date(s) Administered   Fluad Quad(high Dose 65+) 12/31/2018, 02/22/2021, 11/30/2021   PFIZER(Purple Top)SARS-COV-2 Vaccination 05/02/2019, 05/31/2019, 03/24/2020    TDAP status: Due, Education has been provided regarding the importance of this vaccine. Advised may receive this vaccine at local pharmacy or Health Dept. Aware to provide a copy of the  vaccination record if obtained from local pharmacy or Health Dept. Verbalized acceptance and understanding.  Flu Vaccine status: Up to date  Pneumococcal vaccine status: Due, Education has been provided regarding the importance of this vaccine. Advised may receive this vaccine at local pharmacy or Health Dept. Aware to provide a copy of the vaccination record if obtained from local pharmacy or Health Dept. Verbalized acceptance and understanding.  Covid-19 vaccine status: Completed vaccines  Qualifies for Shingles Vaccine? Yes   Zostavax completed No   Shingrix Completed?: No.     Education has been provided regarding the importance of this vaccine. Patient has been advised to call insurance company to determine out of pocket expense if they have not yet received this vaccine. Advised may also receive vaccine at local pharmacy or Health Dept. Verbalized acceptance and understanding.  Screening Tests Health Maintenance  Topic Date Due   DTaP/Tdap/Td (1 - Tdap) Never done   COVID-19 Vaccine (4 - 2023-24 season) 09/27/2022 (Originally 11/16/2021)   Zoster Vaccines- Shingrix (1 of 2) 12/12/2022 (Originally 12/20/1964)   Pneumonia Vaccine 15+ Years old (1 of 1 - PCV) 09/11/2023 (Originally 12/21/2011)   Colonoscopy  09/11/2023 (Originally 08/05/2022)   INFLUENZA VACCINE  10/17/2022   Medicare Annual Wellness (AWV)  09/11/2023   DEXA SCAN  Completed   Hepatitis C Screening  Completed   HPV VACCINES  Aged Out    Health Maintenance  Health Maintenance Due  Topic Date Due   DTaP/Tdap/Td (1 - Tdap) Never done    Colorectal cancer screening: Referral to GI placed Deferred. Pt aware the office will call re: appt.  Mammogram status: No longer required due to Age.  Bone Density status: Completed 05/20/22. Results reflect: Bone density results: OSTEOPOROSIS. Repeat every   years.  Lung Cancer Screening: (Low Dose CT Chest recommended if Age 95-80 years, 20 pack-year currently smoking OR have quit w/in 15years.) does not qualify.     Additional Screening:  Hepatitis C Screening: does qualify; Completed 06/26/22  Vision Screening: Recommended annual ophthalmology exams for early detection of glaucoma and other disorders of the eye. Is the patient up to date with their annual eye exam?  Yes  Who is the provider or what is the name of the office in which the patient attends annual eye exams? [facility] If pt is not established with a provider, would they like to be referred to a provider to establish care? No .   Dental Screening: Recommended annual dental exams for proper  oral hygiene    Community Resource Referral / Chronic Care Management:  CRR required this visit?  No   CCM required this visit?  No     Plan:     I have personally reviewed and noted the following in the patient's chart:   Medical and social history Use of alcohol, tobacco or illicit drugs  Current medications and supplements including opioid prescriptions. Patient is not currently taking opioid prescriptions. Functional ability and status Nutritional status Physical activity Advanced directives List of other physicians Hospitalizations, surgeries, and ER visits in previous 12 months Vitals Screenings to include cognitive, depression, and falls Referrals and appointments  In addition, I have reviewed and discussed with patient certain preventive protocols, quality metrics, and best practice recommendations. A written personalized care plan for preventive services as well as general preventive health recommendations were provided to patient.     Tillie Rung, LPN   [id number]   After Visit Summary: Given  Nurse Notes: None

## 2022-09-12 ENCOUNTER — Other Ambulatory Visit: Payer: Self-pay

## 2022-09-16 ENCOUNTER — Ambulatory Visit: Payer: Medicare HMO | Admitting: Hematology and Oncology

## 2022-09-16 ENCOUNTER — Ambulatory Visit: Payer: Medicare HMO

## 2022-09-17 ENCOUNTER — Inpatient Hospital Stay (HOSPITAL_BASED_OUTPATIENT_CLINIC_OR_DEPARTMENT_OTHER): Payer: Medicare HMO | Admitting: Adult Health

## 2022-09-17 ENCOUNTER — Encounter: Payer: Self-pay | Admitting: Adult Health

## 2022-09-17 ENCOUNTER — Inpatient Hospital Stay: Payer: Medicare HMO

## 2022-09-17 ENCOUNTER — Other Ambulatory Visit: Payer: Self-pay

## 2022-09-17 ENCOUNTER — Inpatient Hospital Stay: Payer: Medicare HMO | Attending: Hematology and Oncology

## 2022-09-17 VITALS — BP 135/75 | HR 62 | Resp 16

## 2022-09-17 DIAGNOSIS — Z79811 Long term (current) use of aromatase inhibitors: Secondary | ICD-10-CM | POA: Diagnosis not present

## 2022-09-17 DIAGNOSIS — G629 Polyneuropathy, unspecified: Secondary | ICD-10-CM | POA: Diagnosis not present

## 2022-09-17 DIAGNOSIS — Z5112 Encounter for antineoplastic immunotherapy: Secondary | ICD-10-CM | POA: Insufficient documentation

## 2022-09-17 DIAGNOSIS — Z923 Personal history of irradiation: Secondary | ICD-10-CM | POA: Diagnosis not present

## 2022-09-17 DIAGNOSIS — Z9221 Personal history of antineoplastic chemotherapy: Secondary | ICD-10-CM | POA: Insufficient documentation

## 2022-09-17 DIAGNOSIS — M1712 Unilateral primary osteoarthritis, left knee: Secondary | ICD-10-CM | POA: Diagnosis not present

## 2022-09-17 DIAGNOSIS — C50212 Malignant neoplasm of upper-inner quadrant of left female breast: Secondary | ICD-10-CM

## 2022-09-17 DIAGNOSIS — Z170 Estrogen receptor positive status [ER+]: Secondary | ICD-10-CM | POA: Diagnosis not present

## 2022-09-17 DIAGNOSIS — Z95828 Presence of other vascular implants and grafts: Secondary | ICD-10-CM

## 2022-09-17 LAB — CBC WITH DIFFERENTIAL/PLATELET
Abs Immature Granulocytes: 0.01 10*3/uL (ref 0.00–0.07)
Basophils Absolute: 0 10*3/uL (ref 0.0–0.1)
Basophils Relative: 1 %
Eosinophils Absolute: 0.1 10*3/uL (ref 0.0–0.5)
Eosinophils Relative: 2 %
HCT: 37.3 % (ref 36.0–46.0)
Hemoglobin: 12.4 g/dL (ref 12.0–15.0)
Immature Granulocytes: 0 %
Lymphocytes Relative: 24 %
Lymphs Abs: 1.1 10*3/uL (ref 0.7–4.0)
MCH: 29.6 pg (ref 26.0–34.0)
MCHC: 33.2 g/dL (ref 30.0–36.0)
MCV: 89 fL (ref 80.0–100.0)
Monocytes Absolute: 0.5 10*3/uL (ref 0.1–1.0)
Monocytes Relative: 10 %
Neutro Abs: 2.9 10*3/uL (ref 1.7–7.7)
Neutrophils Relative %: 63 %
Platelets: 101 10*3/uL — ABNORMAL LOW (ref 150–400)
RBC: 4.19 MIL/uL (ref 3.87–5.11)
RDW: 12.2 % (ref 11.5–15.5)
WBC: 4.5 10*3/uL (ref 4.0–10.5)
nRBC: 0 % (ref 0.0–0.2)

## 2022-09-17 MED ORDER — ACETAMINOPHEN 325 MG PO TABS
650.0000 mg | ORAL_TABLET | Freq: Once | ORAL | Status: AC
  Administered 2022-09-17: 650 mg via ORAL
  Filled 2022-09-17: qty 2

## 2022-09-17 MED ORDER — HEPARIN SOD (PORK) LOCK FLUSH 100 UNIT/ML IV SOLN
500.0000 [IU] | Freq: Once | INTRAVENOUS | Status: AC | PRN
  Administered 2022-09-17: 500 [IU]

## 2022-09-17 MED ORDER — SODIUM CHLORIDE 0.9% FLUSH
10.0000 mL | Freq: Once | INTRAVENOUS | Status: AC
  Administered 2022-09-17: 10 mL

## 2022-09-17 MED ORDER — SODIUM CHLORIDE 0.9% FLUSH
10.0000 mL | INTRAVENOUS | Status: DC | PRN
  Administered 2022-09-17: 10 mL

## 2022-09-17 MED ORDER — SODIUM CHLORIDE 0.9 % IV SOLN: Freq: Once | INTRAVENOUS | Status: AC

## 2022-09-17 MED ORDER — TRASTUZUMAB-ANNS CHEMO 150 MG IV SOLR
6.0000 mg/kg | Freq: Once | INTRAVENOUS | Status: AC
  Administered 2022-09-17: 336 mg via INTRAVENOUS
  Filled 2022-09-17: qty 16

## 2022-09-17 MED ORDER — LORATADINE 10 MG PO TABS
10.0000 mg | ORAL_TABLET | Freq: Every day | ORAL | Status: DC
  Administered 2022-09-17: 10 mg via ORAL
  Filled 2022-09-17: qty 1

## 2022-09-17 NOTE — Progress Notes (Signed)
[facility] Cancer Follow up:    [name], NP [address] [place] [address]   DIAGNOSIS:  Cancer Staging  Malignant neoplasm of upper-inner quadrant of left breast in female, estrogen receptor positive (HCC) Staging form: Breast, AJCC 8th Edition - Pathologic: Stage IIA (pT2, pN0, cM0, G3, ER+, PR-, HER2+) - Signed by Rachel Moulds, MD on 11/26/2021 Histologic grading system: 3 grade system   SUMMARY OF ONCOLOGIC HISTORY: Oncology History  Malignant neoplasm of upper-inner quadrant of left breast in female, estrogen receptor positive (HCC)  09/20/2021 Mammogram   Screening mammogram showed extremely dense breasts and a possible mass in the left breast warranting further evaluation.  Breast density category is D Diagnostic mammogram of the breast showed indeterminate mass in the 11:00 location of the left breast.  Indeterminate mass in the lower anterior left axilla.   10/04/2021 Pathology Results   Pathology showed grade 2 invasive ductal carcinoma, left axillary soft tissue needle core biopsy showed benign breast tissue with patchy changes prognostics from the breast tumor showed ER 90% positive strong staining PR 0%, negative, Ki-67 of 30% and HER2 positive for IHC 3+   10/19/2021 Initial Diagnosis   Malignant neoplasm of upper-inner quadrant of left breast in female, estrogen receptor positive (HCC)   10/23/2021 Cancer Staging   Staging form: Breast, AJCC 8th Edition - Pathologic: Stage IIA (pT2, pN0, cM0, G3, ER+, PR-, HER2+) - Signed by Rachel Moulds, MD on 11/26/2021 Histologic grading system: 3 grade system   11/10/2021 Genetic Testing   Negative hereditary cancer genetic testing: no pathogenic variants detected in [facility] CancerNext-Expanded +RNA Panel.  Report date is November 10, 2021.   The CancerNext-Expanded gene panel offered by [facility] and includes sequencing, rearrangement, and RNA analysis for the following 77 genes: AIP, ALK, APC,  ATM, AXIN2, BAP1, BARD1, BLM, BMPR1A, BRCA1, BRCA2, BRIP1, CDC73, CDH1, CDK4, CDKN1B, CDKN2A, CHEK2, CTNNA1, DICER1, FANCC, FH, FLCN, GALNT12, KIF1B, LZTR1, MAX, MEN1, MET, MLH1, MSH2, MSH3, MSH6, MUTYH, NBN, NF1, NF2, NTHL1, PALB2, PHOX2B, PMS2, POT1, PRKAR1A, PTCH1, PTEN, RAD51C, RAD51D, RB1, RECQL, RET, SDHA, SDHAF2, SDHB, SDHC, SDHD, SMAD4, SMARCA4, SMARCB1, SMARCE1, STK11, SUFU, TMEM127, TP53, TSC1, TSC2, VHL and XRCC2 (sequencing and deletion/duplication); EGFR, EGLN1, HOXB13, KIT, MITF, PDGFRA, POLD1, and POLE (sequencing only); EPCAM and GREM1 (deletion/duplication only).    11/15/2021 Surgery   Left breast lumpectomy: 2.4 cm grade 3 invasive ductal carcinoma, margins -2 sentinel lymph nodes negative.   12/10/2021 -  Chemotherapy   Patient is on Treatment Plan : BREAST Paclitaxel + Trastuzumab q7d / Trastuzumab q21d     03/26/2022 - 04/22/2022 Radiation Therapy   03/26/2022 through 04/22/2022 Site Technique Total Dose (Gy) Dose per Fx (Gy) Completed Fx Beam Energies  Breast, Left: Breast_L 3D 42.56/42.56 2.66 16/16 6X  Breast, Left: Breast_L_Bst 3D 8/8 2 4/4 6X    05/2022 -  Anti-estrogen oral therapy   Anastrozole daily     CURRENT THERAPY: Herceptin/Anastrozole  INTERVAL HISTORY: Patricia Marshall 77 y.o. female returns for follow-up of her history of breast cancer on maintenance therapy with Herceptin and anastrozole.  She is tolerating both of these treatments quite well.  She does have some arthralgias and is taking magnesium in the evening.  She is unsure if she can take a multivitamin as well.  Her most recent echocardiogram occurred on Jul 31, 2022 demonstrating a left ventricular ejection fraction of 60 to 65% and a normal global longitudinal strain.  She also underwent bone density testing on  May 20, 2022 demonstrating osteopenia with a T-score -2.3 in the right femur.  She continues to do massage to her breast lymphedema and her breast is improving.  She is considering  decreasing the amount of time that she wears her compression bra.  Patient Active Problem List   Diagnosis Date Noted   Acute deep vein thrombosis (DVT) of left lower extremity (HCC) 04/23/2022   Port-A-Cath in place 02/04/2022   Genetic testing 12/07/2021   Malignant neoplasm of upper-inner quadrant of left breast in female, estrogen receptor positive (HCC) 10/19/2021   Piriformis syndrome of left side 07/12/2015   Degenerative arthritis of left knee 07/12/2015   BEE STING REACTION, LOCAL 10/24/2006    is allergic to other, ampicillin, and anesthesia [id number] [propofol].  MEDICAL HISTORY: Past Medical History:  Diagnosis Date   Arthritis    Breast cancer (HCC)    Chicken pox    High cholesterol    PONV (postoperative nausea and vomiting)    Vitamin D deficiency     SURGICAL HISTORY: Past Surgical History:  Procedure Laterality Date   APPENDECTOMY  1959   BACK SURGERY     1996   BREAST LUMPECTOMY WITH AXILLARY LYMPH NODE BIOPSY Left 11/15/2021   Procedure: LEFT BREAST LUMPECTOMY WITH AXILLARY SENTINEL LYMPH NODE BIOPSY;  Surgeon: Emelia Loron, MD;  Location: [facility];  Service: General;  Laterality: Left;   PORTACATH PLACEMENT Right 11/15/2021   Procedure: INSERTION PORT-A-CATH;  Surgeon: Emelia Loron, MD;  Location: [facility];  Service: General;  Laterality: Right;   TONSILLECTOMY AND ADENOIDECTOMY  1954    SOCIAL HISTORY: Social History   Socioeconomic History   Marital status: Married    Spouse name: Not on file   Number of children: Not on file   Years of education: Not on file   Highest education level: Not on file  Occupational History   Not on file  Tobacco Use   Smoking status: Never   Smokeless tobacco: Never  Vaping Use   Vaping Use: Never used  Substance and Sexual Activity   Alcohol use: Yes    Alcohol/week: 1.0 standard drink of alcohol    Types: 1 Glasses of wine per week   Drug use: Never   Sexual  activity: Not on file  Other Topics Concern   Not on file  Social History Narrative   Married    Retired    International aid/development worker of Corporate investment banker Strain: Low Risk  (09/11/2022)   Overall Financial Resource Strain (CARDIA)    Difficulty of Paying Living Expenses: Not hard at all  Food Insecurity: No Food Insecurity (09/11/2022)   Hunger Vital Sign    Worried About Running Out of Food in the Last Year: Never true    Ran Out of Food in the Last Year: Never true  Transportation Needs: No Transportation Needs (09/11/2022)   PRAPARE - Administrator, Civil Service (Medical): No    Lack of Transportation (Non-Medical): No  Physical Activity: Insufficiently Active (09/11/2022)   Exercise Vital Sign    Days of Exercise per Week: 3 days    Minutes of Exercise per Session: 30 min  Stress: No Stress Concern Present (09/11/2022)   [employer] of Occupational Health - Occupational Stress Questionnaire    Feeling of Stress : Not at all  Social Connections: Socially Integrated (09/11/2022)   Social Connection and Isolation Panel [NHANES]    Frequency of Communication with Friends and Family:  More than three times a week    Frequency of Social Gatherings with Friends and Family: More than three times a week    Attends Religious Services: More than 4 times per year    Active Member of Clubs or Organizations: Yes    Attends Engineer, structural: More than 4 times per year    Marital Status: Married  Catering manager Violence: Not At Risk (09/11/2022)   Humiliation, Afraid, Rape, and Kick questionnaire    Fear of Current or Ex-Partner: No    Emotionally Abused: No    Physically Abused: No    Sexually Abused: No    FAMILY HISTORY: Family History  Problem Relation Age of Onset   Stomach cancer Mother 6   Heart attack Father 61   Cancer Sister        dx mid 60s; unknown primary w/ mets; treated like ovarian cancer   Heart attack Brother 63   Heart attack  Brother 66    Review of Systems  Constitutional:  Negative for appetite change, chills, fatigue, fever and unexpected weight change.  HENT:   Negative for hearing loss, lump/mass and trouble swallowing.   Eyes:  Negative for eye problems and icterus.  Respiratory:  Positive for cough (Very occasional, nonproductive.). Negative for chest tightness and shortness of breath.   Cardiovascular:  Negative for chest pain, leg swelling and palpitations.  Gastrointestinal:  Negative for abdominal distention, abdominal pain, constipation, diarrhea, nausea and vomiting.  Endocrine: Negative for hot flashes.  Genitourinary:  Negative for difficulty urinating.   Musculoskeletal:  Positive for arthralgias.  Skin:  Negative for itching and rash.  Neurological:  Negative for dizziness, extremity weakness, headaches and numbness.  Hematological:  Negative for adenopathy. Does not bruise/bleed easily.  Psychiatric/Behavioral:  Negative for depression. The patient is not nervous/anxious.       PHYSICAL EXAMINATION   Onc Performance Status - 09/17/22 0905       ECOG Perf Status   ECOG Perf Status Restricted in physically strenuous activity but ambulatory and able to carry out work of a light or sedentary nature, e.g., light house work, office work      KPS SCALE   KPS % SCORE Able to carry on normal activity, minor s/s of disease             Vitals:   09/17/22 0830  BP: (!) 144/64  Pulse: 63  Resp: 16  Temp: 98.6 F (37 C)  SpO2: 100%    Physical Exam Constitutional:      General: She is not in acute distress.    Appearance: Normal appearance. She is not toxic-appearing.  HENT:     Head: Normocephalic and atraumatic.     Mouth/Throat:     Mouth: Mucous membranes are moist.     Pharynx: Oropharynx is clear. No oropharyngeal exudate or posterior oropharyngeal erythema.  Eyes:     General: No scleral icterus. Cardiovascular:     Rate and Rhythm: Normal rate and regular rhythm.      Pulses: Normal pulses.     Heart sounds: Normal heart sounds.  Pulmonary:     Effort: Pulmonary effort is normal.     Breath sounds: Normal breath sounds.  Chest:     Comments: Left breast status postlumpectomy and radiation there is some swelling present but minimal erythema.  It appears improved from prior. Abdominal:     General: Abdomen is flat. Bowel sounds are normal. There is no distension.  Palpations: Abdomen is soft.     Tenderness: There is no abdominal tenderness.  Musculoskeletal:        General: No swelling.     Cervical back: Neck supple.  Lymphadenopathy:     Cervical: No cervical adenopathy.  Skin:    General: Skin is warm and dry.     Findings: No rash.  Neurological:     General: No focal deficit present.     Mental Status: She is alert.  Psychiatric:        Mood and Affect: Mood normal.        Behavior: Behavior normal.     LABORATORY DATA:  CBC    Component Value Date/Time   WBC 4.5 09/17/2022 0812   RBC 4.19 09/17/2022 0812   HGB 12.4 09/17/2022 0812   HGB 12.1 06/25/2022 0820   HCT 37.3 09/17/2022 0812   HCT 36.8 06/26/2022 1120   PLT 101 (L) 09/17/2022 0812   PLT 116 (L) 06/25/2022 0820   MCV 89.0 09/17/2022 0812   MCH 29.6 09/17/2022 0812   MCHC 33.2 09/17/2022 0812   RDW 12.2 09/17/2022 0812   LYMPHSABS 1.1 09/17/2022 0812   MONOABS 0.5 09/17/2022 0812   EOSABS 0.1 09/17/2022 0812   BASOSABS 0.0 09/17/2022 0812    CMP     Component Value Date/Time   NA 138 08/26/2022 0822   K 4.5 08/26/2022 0822   CL 104 08/26/2022 0822   CO2 28 08/26/2022 0822   GLUCOSE 104 (H) 08/26/2022 0822   BUN 23 08/26/2022 0822   CREATININE 0.68 08/26/2022 0822   CREATININE 0.76 11/03/2019 0921   CALCIUM 9.7 08/26/2022 0822   PROT 6.9 08/26/2022 0822   ALBUMIN 4.2 08/26/2022 0822   AST 14 (L) 08/26/2022 0822   ALT 9 08/26/2022 0822   ALKPHOS 84 08/26/2022 0822   BILITOT 0.7 08/26/2022 0822   GFRNONAA >60 08/26/2022 0822   GFRNONAA 78 11/03/2019  0921   GFRAA 91 11/03/2019 0921         ASSESSMENT and THERAPY PLAN:   Malignant neoplasm of upper-inner quadrant of left breast in female, estrogen receptor positive (HCC) Patricia Marshall is a 77 year old woman with stage IIa ER positive, HER2 positive invasive ductal carcinoma of the left breast diagnosed in August 2023 status post left breast lumpectomy followed by adjuvant chemotherapy with Taxol and Herceptin, adjuvant radiation therapy, maintenance trastuzumab, and anastrozole.  Current treatment: Herceptin and anastrozole  Stage IIa left breast invasive ductal carcinoma: She continues on treatment with anastrozole and Herceptin.  She is tolerating this treatment well.  She will continue it. Left breast lymphedema: Recommended that she continue to do the exercises.  It is slowly improving and I informed her that sometimes it can take patients as long as a year to have their lymphedema improved. Myalgias: Discussed potentially using Tiger balm and a very small amount on her leg.  Will continue increased water intake and the magnesium in the evening.  I recommended that she also take a multivitamin. Intermittent cough.  This is not happening every day and her lung exam is normal.  I offered a chest x-ray however she wants to wait on doing this. At risk for heart failure: Her most recent echo was normal and stable.  Her next echocardiogram is due in August 2024.  Sherisa will return in 3 weeks for follow-up with Dr. Al Pimple and her next infusion.  All questions were answered. The patient knows to call the clinic with any problems, questions or  concerns. We can certainly see the patient much sooner if necessary.  Total encounter time:20 minutes*in face-to-face visit time, chart review, lab review, care coordination, order entry, and documentation of the encounter time.    Lillard Anes, NP 09/17/22 9:51 AM Medical Oncology and Hematology [facility] [address] [place],  [place] [address] Tel. [phone]    Fax. [phone]  *Total Encounter Time as defined by the Centers for Medicare and Medicaid Services includes, in addition to the face-to-face time of a patient visit (documented in the note above) non-face-to-face time: obtaining and reviewing outside history, ordering and reviewing medications, tests or procedures, care coordination (communications with other health care professionals or caregivers) and documentation in the medical record.

## 2022-09-17 NOTE — Assessment & Plan Note (Signed)
Jacilyn is a 77 year old woman with stage IIa ER positive, HER2 positive invasive ductal carcinoma of the left breast diagnosed in August 2023 status post left breast lumpectomy followed by adjuvant chemotherapy with Taxol and Herceptin, adjuvant radiation therapy, maintenance trastuzumab, and anastrozole.  Current treatment: Herceptin and anastrozole  Stage IIa left breast invasive ductal carcinoma: She continues on treatment with anastrozole and Herceptin.  She is tolerating this treatment well.  She will continue it. Left breast lymphedema: Recommended that she continue to do the exercises.  It is slowly improving and I informed her that sometimes it can take patients as long as a year to have their lymphedema improved. Myalgias: Discussed potentially using Tiger balm and a very small amount on her leg.  Will continue increased water intake and the magnesium in the evening.  I recommended that she also take a multivitamin. Intermittent cough.  This is not happening every day and her lung exam is normal.  I offered a chest x-ray however she wants to wait on doing this. At risk for heart failure: Her most recent echo was normal and stable.  Her next echocardiogram is due in August 2024.  Jeanenne will return in 3 weeks for follow-up with Dr. Al Pimple and her next infusion.

## 2022-09-17 NOTE — Patient Instructions (Signed)
[facility]   Discharge Instructions: Thank you for choosing [facility] to provide your oncology and hematology care.   If you have a lab appointment with the Cancer Center, please go directly to the Cancer Center and check in at the registration area.   Wear comfortable clothing and clothing appropriate for easy access to any Portacath or PICC line.   We strive to give you quality time with your provider. You may need to reschedule your appointment if you arrive late (15 or more minutes).  Arriving late affects you and other patients whose appointments are after yours.  Also, if you miss three or more appointments without notifying the office, you may be dismissed from the clinic at the provider's discretion.      For prescription refill requests, have your pharmacy contact our office and allow 72 hours for refills to be completed.    Today you received the following chemotherapy and/or immunotherapy agents: Trastuzumab (Herceptin)      To help prevent nausea and vomiting after your treatment, we encourage you to take your nausea medication as directed.  BELOW ARE SYMPTOMS THAT SHOULD BE REPORTED IMMEDIATELY: *FEVER GREATER THAN 100.4 F (38 C) OR HIGHER *CHILLS OR SWEATING *NAUSEA AND VOMITING THAT IS NOT CONTROLLED WITH YOUR NAUSEA MEDICATION *UNUSUAL SHORTNESS OF BREATH *UNUSUAL BRUISING OR BLEEDING *URINARY PROBLEMS (pain or burning when urinating, or frequent urination) *BOWEL PROBLEMS (unusual diarrhea, constipation, pain near the anus) TENDERNESS IN MOUTH AND THROAT WITH OR WITHOUT PRESENCE OF ULCERS (sore throat, sores in mouth, or a toothache) UNUSUAL RASH, SWELLING OR PAIN  UNUSUAL VAGINAL DISCHARGE OR ITCHING   Items with * indicate a potential emergency and should be followed up as soon as possible or go to the Emergency Department if any problems should occur.  Please show the CHEMOTHERAPY ALERT CARD or IMMUNOTHERAPY  ALERT CARD at check-in to the Emergency Department and triage nurse.  Should you have questions after your visit or need to cancel or reschedule your appointment, please contact [facility]  Dept: [phone]  and follow the prompts.  Office hours are 8:00 a.m. to 4:30 p.m. Monday - Friday. Please note that voicemails left after 4:00 p.m. may not be returned until the following business day.  We are closed weekends and major holidays. You have access to a nurse at all times for urgent questions. Please call the main number to the clinic Dept: [phone] and follow the prompts.   For any non-urgent questions, you may also contact your provider using MyChart. We now offer e-Visits for anyone 18 and older to request care online for non-urgent symptoms. For details visit [email or web address].   Also download the MyChart app! Go to the app store, search "MyChart", open the app, select [facility], and log in with your MyChart username and password.   

## 2022-09-23 ENCOUNTER — Encounter: Payer: Self-pay | Admitting: *Deleted

## 2022-10-08 ENCOUNTER — Other Ambulatory Visit: Payer: Self-pay

## 2022-10-08 ENCOUNTER — Inpatient Hospital Stay: Payer: Medicare HMO

## 2022-10-08 ENCOUNTER — Encounter: Payer: Self-pay | Admitting: Hematology and Oncology

## 2022-10-08 ENCOUNTER — Inpatient Hospital Stay: Payer: Medicare HMO | Admitting: Hematology and Oncology

## 2022-10-08 DIAGNOSIS — Z170 Estrogen receptor positive status [ER+]: Secondary | ICD-10-CM

## 2022-10-08 DIAGNOSIS — C50212 Malignant neoplasm of upper-inner quadrant of left female breast: Secondary | ICD-10-CM | POA: Diagnosis not present

## 2022-10-08 DIAGNOSIS — Z95828 Presence of other vascular implants and grafts: Secondary | ICD-10-CM

## 2022-10-08 DIAGNOSIS — Z923 Personal history of irradiation: Secondary | ICD-10-CM | POA: Diagnosis not present

## 2022-10-08 DIAGNOSIS — G629 Polyneuropathy, unspecified: Secondary | ICD-10-CM | POA: Diagnosis not present

## 2022-10-08 DIAGNOSIS — M1712 Unilateral primary osteoarthritis, left knee: Secondary | ICD-10-CM | POA: Diagnosis not present

## 2022-10-08 DIAGNOSIS — Z9221 Personal history of antineoplastic chemotherapy: Secondary | ICD-10-CM | POA: Diagnosis not present

## 2022-10-08 DIAGNOSIS — Z5112 Encounter for antineoplastic immunotherapy: Secondary | ICD-10-CM | POA: Diagnosis not present

## 2022-10-08 DIAGNOSIS — Z79811 Long term (current) use of aromatase inhibitors: Secondary | ICD-10-CM | POA: Diagnosis not present

## 2022-10-08 LAB — CBC WITH DIFFERENTIAL/PLATELET
Abs Immature Granulocytes: 0.02 10*3/uL (ref 0.00–0.07)
Basophils Absolute: 0 10*3/uL (ref 0.0–0.1)
Basophils Relative: 0 %
Eosinophils Absolute: 0 10*3/uL (ref 0.0–0.5)
Eosinophils Relative: 1 %
HCT: 35.3 % — ABNORMAL LOW (ref 36.0–46.0)
Hemoglobin: 12.1 g/dL (ref 12.0–15.0)
Immature Granulocytes: 0 %
Lymphocytes Relative: 22 %
Lymphs Abs: 1.1 10*3/uL (ref 0.7–4.0)
MCH: 29.6 pg (ref 26.0–34.0)
MCHC: 34.3 g/dL (ref 30.0–36.0)
MCV: 86.3 fL (ref 80.0–100.0)
Monocytes Absolute: 0.5 10*3/uL (ref 0.1–1.0)
Monocytes Relative: 10 %
Neutro Abs: 3.3 10*3/uL (ref 1.7–7.7)
Neutrophils Relative %: 67 %
Platelets: 100 10*3/uL — ABNORMAL LOW (ref 150–400)
RBC: 4.09 MIL/uL (ref 3.87–5.11)
RDW: 11.7 % (ref 11.5–15.5)
WBC: 5.1 10*3/uL (ref 4.0–10.5)
nRBC: 0 % (ref 0.0–0.2)

## 2022-10-08 LAB — CMP (CANCER CENTER ONLY)
ALT: 8 U/L (ref 0–44)
AST: 12 U/L — ABNORMAL LOW (ref 15–41)
Albumin: 4.1 g/dL (ref 3.5–5.0)
Alkaline Phosphatase: 83 U/L (ref 38–126)
Anion gap: 7 (ref 5–15)
BUN: 18 mg/dL (ref 8–23)
CO2: 28 mmol/L (ref 22–32)
Calcium: 9.7 mg/dL (ref 8.9–10.3)
Chloride: 103 mmol/L (ref 98–111)
Creatinine: 0.6 mg/dL (ref 0.44–1.00)
GFR, Estimated: >60 mL/min (ref >60)
Glucose, Bld: 99 mg/dL (ref 70–99)
Potassium: 4.4 mmol/L (ref 3.5–5.1)
Sodium: 138 mmol/L (ref 135–145)
Total Bilirubin: 0.5 mg/dL (ref 0.3–1.2)
Total Protein: 6.6 g/dL (ref 6.5–8.1)

## 2022-10-08 MED ORDER — LORATADINE 10 MG PO TABS
10.0000 mg | ORAL_TABLET | Freq: Once | ORAL | Status: AC
  Administered 2022-10-08: 10 mg via ORAL
  Filled 2022-10-08: qty 1

## 2022-10-08 MED ORDER — SODIUM CHLORIDE 0.9% FLUSH
10.0000 mL | Freq: Once | INTRAVENOUS | Status: AC
  Administered 2022-10-08: 10 mL

## 2022-10-08 MED ORDER — SODIUM CHLORIDE 0.9 % IV SOLN: Freq: Once | INTRAVENOUS | Status: AC

## 2022-10-08 MED ORDER — HEPARIN SOD (PORK) LOCK FLUSH 100 UNIT/ML IV SOLN
500.0000 [IU] | Freq: Once | INTRAVENOUS | Status: AC | PRN
  Administered 2022-10-08: 500 [IU]

## 2022-10-08 MED ORDER — ACETAMINOPHEN 325 MG PO TABS
650.0000 mg | ORAL_TABLET | Freq: Once | ORAL | Status: AC
  Administered 2022-10-08: 650 mg via ORAL
  Filled 2022-10-08: qty 2

## 2022-10-08 MED ORDER — SODIUM CHLORIDE 0.9% FLUSH
10.0000 mL | INTRAVENOUS | Status: DC | PRN
  Administered 2022-10-08: 10 mL

## 2022-10-08 MED ORDER — TRASTUZUMAB-ANNS CHEMO 150 MG IV SOLR
6.0000 mg/kg | Freq: Once | INTRAVENOUS | Status: AC
  Administered 2022-10-08: 336 mg via INTRAVENOUS
  Filled 2022-10-08: qty 16

## 2022-10-08 NOTE — Progress Notes (Signed)
[facility] Cancer Follow up:    Patricia Frees, NP [address] [place] [address]   DIAGNOSIS:  Cancer Staging  Malignant neoplasm of upper-inner quadrant of left breast in female, estrogen receptor positive (HCC) Staging form: Breast, AJCC 8th Edition - Pathologic: Stage IIA (pT2, pN0, cM0, G3, ER+, PR-, HER2+) - Signed by Rachel Moulds, MD on 11/26/2021 Histologic grading system: 3 grade system   SUMMARY OF ONCOLOGIC HISTORY: Oncology History  Malignant neoplasm of upper-inner quadrant of left breast in female, estrogen receptor positive (HCC)  09/20/2021 Mammogram   Screening mammogram showed extremely dense breasts and a possible mass in the left breast warranting further evaluation.  Breast density category is D Diagnostic mammogram of the breast showed indeterminate mass in the 11:00 location of the left breast.  Indeterminate mass in the lower anterior left axilla.   10/04/2021 Pathology Results   Pathology showed grade 2 invasive ductal carcinoma, left axillary soft tissue needle core biopsy showed benign breast tissue with patchy changes prognostics from the breast tumor showed ER 90% positive strong staining PR 0%, negative, Ki-67 of 30% and HER2 positive for IHC 3+   10/19/2021 Initial Diagnosis   Malignant neoplasm of upper-inner quadrant of left breast in female, estrogen receptor positive (HCC)   10/23/2021 Cancer Staging   Staging form: Breast, AJCC 8th Edition - Pathologic: Stage IIA (pT2, pN0, cM0, G3, ER+, PR-, HER2+) - Signed by Rachel Moulds, MD on 11/26/2021 Histologic grading system: 3 grade system   11/10/2021 Genetic Testing   Negative hereditary cancer genetic testing: no pathogenic variants detected in [facility] CancerNext-Expanded +RNA Panel.  Report date is November 10, 2021.   The CancerNext-Expanded gene panel offered by [facility] and includes sequencing, rearrangement, and RNA analysis for the following 77 genes: AIP, ALK, APC,  ATM, AXIN2, BAP1, BARD1, BLM, BMPR1A, BRCA1, BRCA2, BRIP1, CDC73, CDH1, CDK4, CDKN1B, CDKN2A, CHEK2, CTNNA1, DICER1, FANCC, FH, FLCN, GALNT12, KIF1B, LZTR1, MAX, MEN1, MET, MLH1, MSH2, MSH3, MSH6, MUTYH, NBN, NF1, NF2, NTHL1, PALB2, PHOX2B, PMS2, POT1, PRKAR1A, PTCH1, PTEN, RAD51C, RAD51D, RB1, RECQL, RET, SDHA, SDHAF2, SDHB, SDHC, SDHD, SMAD4, SMARCA4, SMARCB1, SMARCE1, STK11, SUFU, TMEM127, TP53, TSC1, TSC2, VHL and XRCC2 (sequencing and deletion/duplication); EGFR, EGLN1, HOXB13, KIT, MITF, PDGFRA, POLD1, and POLE (sequencing only); EPCAM and GREM1 (deletion/duplication only).    11/15/2021 Surgery   Left breast lumpectomy: 2.4 cm grade 3 invasive ductal carcinoma, margins -2 sentinel lymph nodes negative.   12/10/2021 -  Chemotherapy   Patient is on Treatment Plan : BREAST Paclitaxel + Trastuzumab q7d / Trastuzumab q21d     03/26/2022 - 04/22/2022 Radiation Therapy   03/26/2022 through 04/22/2022 Site Technique Total Dose (Gy) Dose per Fx (Gy) Completed Fx Beam Energies  Breast, Left: Breast_L 3D 42.56/42.56 2.66 16/16 6X  Breast, Left: Breast_L_Bst 3D 8/8 2 4/4 6X     05/2022 -  Anti-estrogen oral therapy   Anastrozole daily     CURRENT THERAPY: Herceptin/Anastrozole  INTERVAL HISTORY:  Patricia Marshall 77 y.o. female returns for follow-up of her history of breast cancer on maintenance therapy with Herceptin and anastrozole.  She is tolerating both of these treatments quite well.    Her last echocardiogram occurred on Jul 31, 2022 demonstrating a left ventricular ejection fraction of 60 to 65% and a normal global longitudinal strain.  She also underwent bone density testing on May 20, 2022 demonstrating osteopenia with a T-score -2.3 in the right femur.  She had a few questions written today to  review, First of all neuropathy is better. LLE swelling has improving as well. She has noted some tenderness below the left rib cage, this is waxing and waning, Mr Zhong wonders if is related to  compression bra. She continues with the breast massages, finds it useful but minimally. She is taking some magnesium glycinate for bilateral knee aches. Rest of the pertinent 10 point ROS reviewed and neg.  Patient Active Problem List   Diagnosis Date Noted   Acute deep vein thrombosis (DVT) of left lower extremity (HCC) 04/23/2022   Port-A-Cath in place 02/04/2022   Genetic testing 12/07/2021   Malignant neoplasm of upper-inner quadrant of left breast in female, estrogen receptor positive (HCC) 10/19/2021   Piriformis syndrome of left side 07/12/2015   Degenerative arthritis of left knee 07/12/2015   BEE STING REACTION, LOCAL 10/24/2006    is allergic to other, ampicillin, and anesthesia [id number] [propofol].  MEDICAL HISTORY: Past Medical History:  Diagnosis Date   Arthritis    Breast cancer (HCC)    Chicken pox    High cholesterol    PONV (postoperative nausea and vomiting)    Vitamin D deficiency     SURGICAL HISTORY: Past Surgical History:  Procedure Laterality Date   APPENDECTOMY  1959   BACK SURGERY     1996   BREAST LUMPECTOMY WITH AXILLARY LYMPH NODE BIOPSY Left 11/15/2021   Procedure: LEFT BREAST LUMPECTOMY WITH AXILLARY SENTINEL LYMPH NODE BIOPSY;  Surgeon: Emelia Loron, MD;  Location: [facility];  Service: General;  Laterality: Left;   PORTACATH PLACEMENT Right 11/15/2021   Procedure: INSERTION PORT-A-CATH;  Surgeon: Emelia Loron, MD;  Location: [facility];  Service: General;  Laterality: Right;   TONSILLECTOMY AND ADENOIDECTOMY  1954    SOCIAL HISTORY: Social History   Socioeconomic History   Marital status: Married    Spouse name: Not on file   Number of children: Not on file   Years of education: Not on file   Highest education level: Not on file  Occupational History   Not on file  Tobacco Use   Smoking status: Never   Smokeless tobacco: Never  Vaping Use   Vaping status: Never Used  Substance and Sexual  Activity   Alcohol use: Yes    Alcohol/week: 1.0 standard drink of alcohol    Types: 1 Glasses of wine per week   Drug use: Never   Sexual activity: Not on file  Other Topics Concern   Not on file  Social History Narrative   Married    Retired    International aid/development worker of Corporate investment banker Strain: Low Risk  (09/11/2022)   Overall Financial Resource Strain (CARDIA)    Difficulty of Paying Living Expenses: Not hard at all  Food Insecurity: No Food Insecurity (09/11/2022)   Hunger Vital Sign    Worried About Running Out of Food in the Last Year: Never true    Ran Out of Food in the Last Year: Never true  Transportation Needs: No Transportation Needs (09/11/2022)   PRAPARE - Administrator, Civil Service (Medical): No    Lack of Transportation (Non-Medical): No  Physical Activity: Insufficiently Active (09/11/2022)   Exercise Vital Sign    Days of Exercise per Week: 3 days    Minutes of Exercise per Session: 30 min  Stress: No Stress Concern Present (09/11/2022)   [employer] of Occupational Health - Occupational Stress Questionnaire    Feeling of Stress : Not at  all  Social Connections: Socially Integrated (09/11/2022)   Social Connection and Isolation Panel [NHANES]    Frequency of Communication with Friends and Family: More than three times a week    Frequency of Social Gatherings with Friends and Family: More than three times a week    Attends Religious Services: More than 4 times per year    Active Member of [employer] or Organizations: Yes    Attends Engineer, structural: More than 4 times per year    Marital Status: Married  Catering manager Violence: Not At Risk (09/11/2022)   Humiliation, Afraid, Rape, and Kick questionnaire    Fear of Current or Ex-Partner: No    Emotionally Abused: No    Physically Abused: No    Sexually Abused: No    FAMILY HISTORY: Family History  Problem Relation Age of Onset   Stomach cancer Mother 18   Heart attack  Father 32   Cancer Sister        dx mid 25s; unknown primary w/ mets; treated like ovarian cancer   Heart attack Brother 15   Heart attack Brother 33    PHYSICAL EXAMINATION    Vitals:   10/08/22 1148  BP: (!) 142/62  Pulse: 66  Resp: 16  Temp: 97.7 F (36.5 C)  SpO2: 100%    Physical Exam Constitutional:      General: She is not in acute distress.    Appearance: Normal appearance. She is not toxic-appearing.  HENT:     Head: Normocephalic and atraumatic.     Mouth/Throat:     Mouth: Mucous membranes are moist.     Pharynx: Oropharynx is clear. No oropharyngeal exudate or posterior oropharyngeal erythema.  Eyes:     General: No scleral icterus. Cardiovascular:     Rate and Rhythm: Normal rate and regular rhythm.     Pulses: Normal pulses.     Heart sounds: Normal heart sounds.  Pulmonary:     Effort: Pulmonary effort is normal.     Breath sounds: Normal breath sounds.  Chest:     Comments: Post surgical swelling in the left breast, small seroma, swelling mostly focused in the inferior portion of the left breast No new masses. No palpable concerns in the area of tenderness. No regional adenopathy. Abdominal:     General: Abdomen is flat. Bowel sounds are normal. There is no distension.     Palpations: Abdomen is soft.     Tenderness: There is no abdominal tenderness.  Musculoskeletal:        General: No swelling.     Cervical back: Neck supple.  Lymphadenopathy:     Cervical: No cervical adenopathy.  Skin:    General: Skin is warm and dry.     Findings: No rash.  Neurological:     General: No focal deficit present.     Mental Status: She is alert.  Psychiatric:        Mood and Affect: Mood normal.        Behavior: Behavior normal.     LABORATORY DATA:  CBC    Component Value Date/Time   WBC 5.1 10/08/2022 1116   RBC 4.09 10/08/2022 1116   HGB 12.1 10/08/2022 1116   HGB 12.1 06/25/2022 0820   HCT 35.3 (L) 10/08/2022 1116   HCT 36.8 06/26/2022 1120    PLT 100 (L) 10/08/2022 1116   PLT 116 (L) 06/25/2022 0820   MCV 86.3 10/08/2022 1116   MCH 29.6 10/08/2022 1116   MCHC  34.3 10/08/2022 1116   RDW 11.7 10/08/2022 1116   LYMPHSABS 1.1 10/08/2022 1116   MONOABS 0.5 10/08/2022 1116   EOSABS 0.0 10/08/2022 1116   BASOSABS 0.0 10/08/2022 1116    CMP     Component Value Date/Time   NA 138 10/08/2022 1116   K 4.4 10/08/2022 1116   CL 103 10/08/2022 1116   CO2 28 10/08/2022 1116   GLUCOSE 99 10/08/2022 1116   BUN 18 10/08/2022 1116   CREATININE 0.60 10/08/2022 1116   CREATININE 0.76 11/03/2019 0921   CALCIUM 9.7 10/08/2022 1116   PROT 6.6 10/08/2022 1116   ALBUMIN 4.1 10/08/2022 1116   AST 12 (L) 10/08/2022 1116   ALT 8 10/08/2022 1116   ALKPHOS 83 10/08/2022 1116   BILITOT 0.5 10/08/2022 1116   GFRNONAA >60 10/08/2022 1116   GFRNONAA 78 11/03/2019 0921   GFRAA 91 11/03/2019 0921      ASSESSMENT and THERAPY PLAN:   Malignant neoplasm of upper-inner quadrant of left breast in female, estrogen receptor positive (HCC) This is a very pleasant 77 year old postmenopausal female patient with newly diagnosed left breast upper inner quadrant invasive ductal carcinoma, grade 2, ER +90% strong staining, PR 0% negative, HER2 positive by IHC 3+, Ki-67 of 30% referred to breast [facility] for additional recommendations.   She is tolerating adjuvant herceptin well. Most recent ECHO satisfactory. She completed adjuvant Taxol and Herceptin, omitted the last couple dose of paclitaxel given worsening neuropathy. Neuropathy is better, No concerns on physical exam except for post surgical changes and post radiation changes. She wants to defer mammogram till September. This should be reasonable. She has chronic left lower extremity swelling from DVT and post thrombotic syndrome. This has improved overall. She is very pleased. Mild thrombocytopenia, no clear etiology, we will continue to monitor. We are continuing to monitor this. Once again, most of her  issues are quite tolerable, so we will continue to monitor. Return to clinic every 3 weeks, okay to alternate between me and the APP team.   All questions were answered. The patient knows to call the clinic with any problems, questions or concerns. We can certainly see the patient much sooner if necessary.  Total encounter time:30 minutes*in face-to-face visit time, chart review, lab review, care coordination, order entry, and documentation of the encounter time.   *Total Encounter Time as defined by the Centers for Medicare and Medicaid Services includes, in addition to the face-to-face time of a patient visit (documented in the note above) non-face-to-face time: obtaining and reviewing outside history, ordering and reviewing medications, tests or procedures, care coordination (communications with other health care professionals or caregivers) and documentation in the medical record.

## 2022-10-08 NOTE — Assessment & Plan Note (Addendum)
This is a very pleasant 77 year old postmenopausal female patient with newly diagnosed left breast upper inner quadrant invasive ductal carcinoma, grade 2, ER +90% strong staining, PR 0% negative, HER2 positive by IHC 3+, Ki-67 of 30% referred to breast [facility] for additional recommendations.   She is tolerating adjuvant herceptin well. Most recent ECHO satisfactory. She completed adjuvant Taxol and Herceptin, omitted the last couple dose of paclitaxel given worsening neuropathy. Neuropathy is better, No concerns on physical exam except for post surgical changes and post radiation changes. She wants to defer mammogram till September. This should be reasonable. She has chronic left lower extremity swelling from DVT and post thrombotic syndrome. This has improved overall. She is very pleased. Mild thrombocytopenia, no clear etiology, we will continue to monitor. We are continuing to monitor this. Once again, most of her issues are quite tolerable, so we will continue to monitor. Return to clinic every 3 weeks, okay to alternate between me and the APP team.

## 2022-10-08 NOTE — Patient Instructions (Signed)

## 2022-10-14 ENCOUNTER — Other Ambulatory Visit: Payer: Self-pay | Admitting: General Surgery

## 2022-10-29 ENCOUNTER — Inpatient Hospital Stay: Payer: Medicare HMO | Attending: Hematology and Oncology | Admitting: Adult Health

## 2022-10-29 ENCOUNTER — Encounter: Payer: Self-pay | Admitting: Adult Health

## 2022-10-29 ENCOUNTER — Inpatient Hospital Stay: Payer: Medicare HMO

## 2022-10-29 ENCOUNTER — Other Ambulatory Visit: Payer: Self-pay

## 2022-10-29 ENCOUNTER — Ambulatory Visit (HOSPITAL_BASED_OUTPATIENT_CLINIC_OR_DEPARTMENT_OTHER)
Admission: RE | Admit: 2022-10-29 | Discharge: 2022-10-29 | Disposition: A | Discharge status: Home / Self Care | Payer: Medicare HMO | Source: Ambulatory Visit | Attending: Adult Health | Admitting: Adult Health

## 2022-10-29 VITALS — BP 134/73 | HR 60 | Temp 98.2°F | Wt 125.2 lb

## 2022-10-29 DIAGNOSIS — Z170 Estrogen receptor positive status [ER+]: Secondary | ICD-10-CM

## 2022-10-29 DIAGNOSIS — Z5112 Encounter for antineoplastic immunotherapy: Secondary | ICD-10-CM | POA: Diagnosis not present

## 2022-10-29 DIAGNOSIS — M1712 Unilateral primary osteoarthritis, left knee: Secondary | ICD-10-CM | POA: Insufficient documentation

## 2022-10-29 DIAGNOSIS — M7989 Other specified soft tissue disorders: Secondary | ICD-10-CM

## 2022-10-29 DIAGNOSIS — C50212 Malignant neoplasm of upper-inner quadrant of left female breast: Secondary | ICD-10-CM | POA: Diagnosis not present

## 2022-10-29 DIAGNOSIS — Z923 Personal history of irradiation: Secondary | ICD-10-CM | POA: Insufficient documentation

## 2022-10-29 DIAGNOSIS — Z79811 Long term (current) use of aromatase inhibitors: Secondary | ICD-10-CM | POA: Insufficient documentation

## 2022-10-29 DIAGNOSIS — Z95828 Presence of other vascular implants and grafts: Secondary | ICD-10-CM

## 2022-10-29 LAB — CMP (CANCER CENTER ONLY)
ALT: 11 U/L (ref 0–44)
AST: 16 U/L (ref 15–41)
Albumin: 4.2 g/dL (ref 3.5–5.0)
Alkaline Phosphatase: 92 U/L (ref 38–126)
Anion gap: 6 (ref 5–15)
BUN: 21 mg/dL (ref 8–23)
CO2: 28 mmol/L (ref 22–32)
Calcium: 9.3 mg/dL (ref 8.9–10.3)
Chloride: 104 mmol/L (ref 98–111)
Creatinine: 0.68 mg/dL (ref 0.44–1.00)
GFR, Estimated: >60 mL/min (ref >60)
Glucose, Bld: 99 mg/dL (ref 70–99)
Potassium: 4.6 mmol/L (ref 3.5–5.1)
Sodium: 138 mmol/L (ref 135–145)
Total Bilirubin: 0.5 mg/dL (ref 0.3–1.2)
Total Protein: 6.9 g/dL (ref 6.5–8.1)

## 2022-10-29 LAB — CBC WITH DIFFERENTIAL/PLATELET
Abs Immature Granulocytes: 0.01 10*3/uL (ref 0.00–0.07)
Basophils Absolute: 0 10*3/uL (ref 0.0–0.1)
Basophils Relative: 1 %
Eosinophils Absolute: 0.1 10*3/uL (ref 0.0–0.5)
Eosinophils Relative: 1 %
HCT: 37.4 % (ref 36.0–46.0)
Hemoglobin: 12.6 g/dL (ref 12.0–15.0)
Immature Granulocytes: 0 %
Lymphocytes Relative: 26 %
Lymphs Abs: 1.3 10*3/uL (ref 0.7–4.0)
MCH: 29 pg (ref 26.0–34.0)
MCHC: 33.7 g/dL (ref 30.0–36.0)
MCV: 86.2 fL (ref 80.0–100.0)
Monocytes Absolute: 0.5 10*3/uL (ref 0.1–1.0)
Monocytes Relative: 10 %
Neutro Abs: 3.1 10*3/uL (ref 1.7–7.7)
Neutrophils Relative %: 62 %
Platelets: 110 10*3/uL — ABNORMAL LOW (ref 150–400)
RBC: 4.34 MIL/uL (ref 3.87–5.11)
RDW: 12 % (ref 11.5–15.5)
WBC: 5.1 10*3/uL (ref 4.0–10.5)
nRBC: 0 % (ref 0.0–0.2)

## 2022-10-29 MED ORDER — TRASTUZUMAB-ANNS CHEMO 150 MG IV SOLR
6.0000 mg/kg | Freq: Once | INTRAVENOUS | Status: AC
  Administered 2022-10-29: 336 mg via INTRAVENOUS
  Filled 2022-10-29: qty 16

## 2022-10-29 MED ORDER — HEPARIN SOD (PORK) LOCK FLUSH 100 UNIT/ML IV SOLN
500.0000 [IU] | Freq: Once | INTRAVENOUS | Status: AC | PRN
  Administered 2022-10-29: 500 [IU]

## 2022-10-29 MED ORDER — ACETAMINOPHEN 325 MG PO TABS
650.0000 mg | ORAL_TABLET | Freq: Once | ORAL | Status: AC
  Administered 2022-10-29: 650 mg via ORAL
  Filled 2022-10-29: qty 2

## 2022-10-29 MED ORDER — SODIUM CHLORIDE 0.9% FLUSH
10.0000 mL | Freq: Once | INTRAVENOUS | Status: AC
  Administered 2022-10-29: 10 mL

## 2022-10-29 MED ORDER — SODIUM CHLORIDE 0.9 % IV SOLN: Freq: Once | INTRAVENOUS | Status: AC

## 2022-10-29 MED ORDER — LORATADINE 10 MG PO TABS
10.0000 mg | ORAL_TABLET | Freq: Once | ORAL | Status: AC
  Administered 2022-10-29: 10 mg via ORAL
  Filled 2022-10-29: qty 1

## 2022-10-29 MED ORDER — SODIUM CHLORIDE 0.9% FLUSH
10.0000 mL | INTRAVENOUS | Status: DC | PRN
  Administered 2022-10-29: 10 mL

## 2022-10-29 NOTE — Progress Notes (Signed)
[facility] Cancer Follow up:    Patricia Frees, NP [address] [place] [address]   DIAGNOSIS:  Cancer Staging  Malignant neoplasm of upper-inner quadrant of left breast in female, estrogen receptor positive (HCC) Staging form: Breast, AJCC 8th Edition - Pathologic: Stage IIA (pT2, pN0, cM0, G3, ER+, PR-, HER2+) - Signed by Rachel Moulds, MD on 11/26/2021 Histologic grading system: 3 grade system   SUMMARY OF ONCOLOGIC HISTORY: Oncology History  Malignant neoplasm of upper-inner quadrant of left breast in female, estrogen receptor positive (HCC)  09/20/2021 Mammogram   Screening mammogram showed extremely dense breasts and a possible mass in the left breast warranting further evaluation.  Breast density category is D Diagnostic mammogram of the breast showed indeterminate mass in the 11:00 location of the left breast.  Indeterminate mass in the lower anterior left axilla.   10/04/2021 Pathology Results   Pathology showed grade 2 invasive ductal carcinoma, left axillary soft tissue needle core biopsy showed benign breast tissue with patchy changes prognostics from the breast tumor showed ER 90% positive strong staining PR 0%, negative, Ki-67 of 30% and HER2 positive for IHC 3+   10/19/2021 Initial Diagnosis   Malignant neoplasm of upper-inner quadrant of left breast in female, estrogen receptor positive (HCC)   10/23/2021 Cancer Staging   Staging form: Breast, AJCC 8th Edition - Pathologic: Stage IIA (pT2, pN0, cM0, G3, ER+, PR-, HER2+) - Signed by Rachel Moulds, MD on 11/26/2021 Histologic grading system: 3 grade system   11/10/2021 Genetic Testing   Negative hereditary cancer genetic testing: no pathogenic variants detected in [facility] CancerNext-Expanded +RNA Panel.  Report date is November 10, 2021.   The CancerNext-Expanded gene panel offered by [facility] and includes sequencing, rearrangement, and RNA analysis for the following 77 genes: AIP, ALK, APC,  ATM, AXIN2, BAP1, BARD1, BLM, BMPR1A, BRCA1, BRCA2, BRIP1, CDC73, CDH1, CDK4, CDKN1B, CDKN2A, CHEK2, CTNNA1, DICER1, FANCC, FH, FLCN, GALNT12, KIF1B, LZTR1, MAX, MEN1, MET, MLH1, MSH2, MSH3, MSH6, MUTYH, NBN, NF1, NF2, NTHL1, PALB2, PHOX2B, PMS2, POT1, PRKAR1A, PTCH1, PTEN, RAD51C, RAD51D, RB1, RECQL, RET, SDHA, SDHAF2, SDHB, SDHC, SDHD, SMAD4, SMARCA4, SMARCB1, SMARCE1, STK11, SUFU, TMEM127, TP53, TSC1, TSC2, VHL and XRCC2 (sequencing and deletion/duplication); EGFR, EGLN1, HOXB13, KIT, MITF, PDGFRA, POLD1, and POLE (sequencing only); EPCAM and GREM1 (deletion/duplication only).    11/15/2021 Surgery   Left breast lumpectomy: 2.4 cm grade 3 invasive ductal carcinoma, margins -2 sentinel lymph nodes negative.   12/10/2021 -  Chemotherapy   Patient is on Treatment Plan : BREAST Paclitaxel + Trastuzumab q7d / Trastuzumab q21d     03/26/2022 - 04/22/2022 Radiation Therapy   03/26/2022 through 04/22/2022 Site Technique Total Dose (Gy) Dose per Fx (Gy) Completed Fx Beam Energies  Breast, Left: Breast_L 3D 42.56/42.56 2.66 16/16 6X  Breast, Left: Breast_L_Bst 3D 8/8 2 4/4 6X     05/2022 -  Anti-estrogen oral therapy   Anastrozole daily     CURRENT THERAPY: Herceptin and anastrozole.  INTERVAL HISTORY: Patricia Marshall 77 y.o. female returns for follow-up prior to receiving Herceptin therapy.  She is tolerating Herceptin every 3 weeks and anastrozole daily without any difficulty.  Her most recent echocardiogram occurred on Jul 31, 2022 demonstrating a left ventricular ejection fraction of 60 to 65%.  Her global longitudinal strain was normal.  Her next echocardiogram is scheduled October 31, 2022.  Patricia Marshall's most recent bone density testing occurred on May 20, 2022 demonstrating osteopenia with a T-score -2.3 in the right femur.  She  is scheduled for a bilateral breast diagnostic mammogram on January 07, 2023.  Patricia Marshall is doing well on her treatment.  She tells me that she is scheduled for port removal in  September 2024 with Dr. Dwain Sarna along with her mammogram in October 2024.  She notes that she has left leg swelling that is getting slightly worse.  This is the site of her previous left leg DVT that she took Eliquis for for 6 months and then stopped.  She denies any other significant issues other than being hot sometimes.   Patient Active Problem List   Diagnosis Date Noted   Acute deep vein thrombosis (DVT) of left lower extremity (HCC) 04/23/2022   Port-A-Cath in place 02/04/2022   Genetic testing 12/07/2021   Malignant neoplasm of upper-inner quadrant of left breast in female, estrogen receptor positive (HCC) 10/19/2021   Piriformis syndrome of left side 07/12/2015   Degenerative arthritis of left knee 07/12/2015   BEE STING REACTION, LOCAL 10/24/2006    is allergic to other, ampicillin, and anesthesia [id number] [propofol].  MEDICAL HISTORY: Past Medical History:  Diagnosis Date   Arthritis    Breast cancer (HCC)    Chicken pox    High cholesterol    PONV (postoperative nausea and vomiting)    Vitamin D deficiency     SURGICAL HISTORY: Past Surgical History:  Procedure Laterality Date   APPENDECTOMY  1959   BACK SURGERY     1996   BREAST LUMPECTOMY WITH AXILLARY LYMPH NODE BIOPSY Left 11/15/2021   Procedure: LEFT BREAST LUMPECTOMY WITH AXILLARY SENTINEL LYMPH NODE BIOPSY;  Surgeon: Emelia Loron, MD;  Location: [facility];  Service: General;  Laterality: Left;   PORTACATH PLACEMENT Right 11/15/2021   Procedure: INSERTION PORT-A-CATH;  Surgeon: Emelia Loron, MD;  Location: [facility];  Service: General;  Laterality: Right;   TONSILLECTOMY AND ADENOIDECTOMY  1954    SOCIAL HISTORY: Social History   Socioeconomic History   Marital status: Married    Spouse name: Not on file   Number of children: Not on file   Years of education: Not on file   Highest education level: Not on file  Occupational History   Not on file  Tobacco Use    Smoking status: Never   Smokeless tobacco: Never  Vaping Use   Vaping status: Never Used  Substance and Sexual Activity   Alcohol use: Yes    Alcohol/week: 1.0 standard drink of alcohol    Types: 1 Glasses of wine per week   Drug use: Never   Sexual activity: Not on file  Other Topics Concern   Not on file  Social History Narrative   Married    Retired    International aid/development worker of Corporate investment banker Strain: Low Risk  (09/11/2022)   Overall Financial Resource Strain (CARDIA)    Difficulty of Paying Living Expenses: Not hard at all  Food Insecurity: No Food Insecurity (09/11/2022)   Hunger Vital Sign    Worried About Running Out of Food in the Last Year: Never true    Ran Out of Food in the Last Year: Never true  Transportation Needs: No Transportation Needs (09/11/2022)   PRAPARE - Administrator, Civil Service (Medical): No    Lack of Transportation (Non-Medical): No  Physical Activity: Insufficiently Active (09/11/2022)   Exercise Vital Sign    Days of Exercise per Week: 3 days    Minutes of Exercise per Session: 30 min  Stress:  No Stress Concern Present (09/11/2022)   [employer] of Occupational Health - Occupational Stress Questionnaire    Feeling of Stress : Not at all  Social Connections: Socially Integrated (09/11/2022)   Social Connection and Isolation Panel [NHANES]    Frequency of Communication with Friends and Family: More than three times a week    Frequency of Social Gatherings with Friends and Family: More than three times a week    Attends Religious Services: More than 4 times per year    Active Member of [employer] or Organizations: Yes    Attends Engineer, structural: More than 4 times per year    Marital Status: Married  Catering manager Violence: Not At Risk (09/11/2022)   Humiliation, Afraid, Rape, and Kick questionnaire    Fear of Current or Ex-Partner: No    Emotionally Abused: No    Physically Abused: No    Sexually Abused:  No    FAMILY HISTORY: Family History  Problem Relation Age of Onset   Stomach cancer Mother 64   Heart attack Father 71   Cancer Sister        dx mid 45s; unknown primary w/ mets; treated like ovarian cancer   Heart attack Brother 25   Heart attack Brother 47    Review of Systems  Constitutional:  Negative for appetite change, chills, fatigue, fever and unexpected weight change.  HENT:   Negative for hearing loss, lump/mass and trouble swallowing.   Eyes:  Negative for eye problems and icterus.  Respiratory:  Negative for chest tightness, cough and shortness of breath.   Cardiovascular:  Positive for leg swelling. Negative for chest pain and palpitations.  Gastrointestinal:  Negative for abdominal distention, abdominal pain, constipation, diarrhea, nausea and vomiting.  Endocrine: Positive for hot flashes.  Genitourinary:  Negative for difficulty urinating.   Musculoskeletal:  Negative for arthralgias.  Skin:  Negative for itching and rash.  Neurological:  Negative for dizziness, extremity weakness, headaches and numbness.  Hematological:  Negative for adenopathy. Does not bruise/bleed easily.  Psychiatric/Behavioral:  Negative for depression. The patient is not nervous/anxious.       PHYSICAL EXAMINATION    Vitals:   10/29/22 1050  BP: 134/73  Pulse: 60  Temp: 98.2 F (36.8 C)  SpO2: 100%    Physical Exam Constitutional:      General: She is not in acute distress.    Appearance: Normal appearance. She is not toxic-appearing.  HENT:     Head: Normocephalic and atraumatic.     Mouth/Throat:     Mouth: Mucous membranes are moist.     Pharynx: Oropharynx is clear. No oropharyngeal exudate or posterior oropharyngeal erythema.  Eyes:     General: No scleral icterus. Cardiovascular:     Rate and Rhythm: Normal rate and regular rhythm.     Pulses: Normal pulses.     Heart sounds: Normal heart sounds.  Pulmonary:     Effort: Pulmonary effort is normal.     Breath  sounds: Normal breath sounds.  Chest:     Comments: Left breast status postlumpectomy and radiation no sign of local recurrence right breast is benign Abdominal:     General: Abdomen is flat. Bowel sounds are normal. There is no distension.     Palpations: Abdomen is soft.     Tenderness: There is no abdominal tenderness.  Musculoskeletal:        General: No swelling.     Cervical back: Neck supple.  Lymphadenopathy:  Cervical: No cervical adenopathy.  Skin:    General: Skin is warm and dry.     Findings: No rash.  Neurological:     General: No focal deficit present.     Mental Status: She is alert.  Psychiatric:        Mood and Affect: Mood normal.        Behavior: Behavior normal.     LABORATORY DATA:  CBC    Component Value Date/Time   WBC 5.1 10/29/2022 1026   RBC 4.34 10/29/2022 1026   HGB 12.6 10/29/2022 1026   HGB 12.1 06/25/2022 0820   HCT 37.4 10/29/2022 1026   HCT 36.8 06/26/2022 1120   PLT 110 (L) 10/29/2022 1026   PLT 116 (L) 06/25/2022 0820   MCV 86.2 10/29/2022 1026   MCH 29.0 10/29/2022 1026   MCHC 33.7 10/29/2022 1026   RDW 12.0 10/29/2022 1026   LYMPHSABS 1.3 10/29/2022 1026   MONOABS 0.5 10/29/2022 1026   EOSABS 0.1 10/29/2022 1026   BASOSABS 0.0 10/29/2022 1026    CMP     Component Value Date/Time   NA 138 10/29/2022 1026   K 4.6 10/29/2022 1026   CL 104 10/29/2022 1026   CO2 28 10/29/2022 1026   GLUCOSE 99 10/29/2022 1026   BUN 21 10/29/2022 1026   CREATININE 0.68 10/29/2022 1026   CREATININE 0.76 11/03/2019 0921   CALCIUM 9.3 10/29/2022 1026   PROT 6.9 10/29/2022 1026   ALBUMIN 4.2 10/29/2022 1026   AST 16 10/29/2022 1026   ALT 11 10/29/2022 1026   ALKPHOS 92 10/29/2022 1026   BILITOT 0.5 10/29/2022 1026   GFRNONAA >60 10/29/2022 1026   GFRNONAA 78 11/03/2019 0921   GFRAA 91 11/03/2019 0921       ASSESSMENT and THERAPY PLAN:   Malignant neoplasm of upper-inner quadrant of left breast in female, estrogen receptor  positive (HCC) Patricia Marshall is a 77 year old woman with stage IIa ER positive, HER2 positive invasive ductal carcinoma of the left breast diagnosed in August 2023 status post left breast lumpectomy followed by adjuvant chemotherapy with Taxol and Herceptin, adjuvant radiation therapy, maintenance trastuzumab, and anastrozole.  Current treatment: Herceptin and anastrozole  Stage IIa left breast invasive ductal carcinoma: She continues on treatment with anastrozole and Herceptin.  She is tolerating this treatment well.  She will continue it. Left breast lymphedema Continue breast massage Left leg swelling: will repeat doppler to ensure her previous DVT has not recurred.   At risk for heart failure: Her most recent echo was normal and stable.  Her next echocardiogram is scheduled on 10/31/2022.   Patricia Marshall will return in 3 weeks for follow-up with Dr. Al Pimple and her next infusion.  I will see her back in 3 months for her SCP visit.     All questions were answered. The patient knows to call the clinic with any problems, questions or concerns. We can certainly see the patient much sooner if necessary.  Total encounter time:45 minutes*in face-to-face visit time, chart review, lab review, care coordination, order entry, and documentation of the encounter time.    Lillard Anes, NP 10/29/22 5:06 PM Medical Oncology and Hematology [facility] [address] [place], [place] [address] Tel. [phone]    Fax. [phone]  *Total Encounter Time as defined by the Centers for Medicare and Medicaid Services includes, in addition to the face-to-face time of a patient visit (documented in the note above) non-face-to-face time: obtaining and reviewing outside history, ordering and reviewing medications, tests  or procedures, care coordination (communications with other health care professionals or caregivers) and documentation in the medical record.

## 2022-10-29 NOTE — Assessment & Plan Note (Signed)
Patricia Marshall is a 77 year old woman with stage IIa ER positive, HER2 positive invasive ductal carcinoma of the left breast diagnosed in August 2023 status post left breast lumpectomy followed by adjuvant chemotherapy with Taxol and Herceptin, adjuvant radiation therapy, maintenance trastuzumab, and anastrozole.  Current treatment: Herceptin and anastrozole  Stage IIa left breast invasive ductal carcinoma: She continues on treatment with anastrozole and Herceptin.  She is tolerating this treatment well.  She will continue it. Left breast lymphedema Continue breast massage Left leg swelling: will repeat doppler to ensure her previous DVT has not recurred.   At risk for heart failure: Her most recent echo was normal and stable.  Her next echocardiogram is scheduled on 10/31/2022.   [name] will return in 3 weeks for follow-up with Dr. Al Pimple and her next infusion.  I will see her back in 3 months for her SCP visit.

## 2022-10-29 NOTE — Patient Instructions (Signed)
[facility]  Discharge Instructions: Thank you for choosing [facility] to provide your oncology and hematology care.   If you have a lab appointment with the Cancer Center, please go directly to the Cancer Center and check in at the registration area.   Wear comfortable clothing and clothing appropriate for easy access to any Portacath or PICC line.   We strive to give you quality time with your provider. You may need to reschedule your appointment if you arrive late (15 or more minutes).  Arriving late affects you and other patients whose appointments are after yours.  Also, if you miss three or more appointments without notifying the office, you may be dismissed from the clinic at the provider's discretion.      For prescription refill requests, have your pharmacy contact our office and allow 72 hours for refills to be completed.    Today you received the following chemotherapy and/or immunotherapy agents: Kanjinti.      To help prevent nausea and vomiting after your treatment, we encourage you to take your nausea medication as directed.  BELOW ARE SYMPTOMS THAT SHOULD BE REPORTED IMMEDIATELY: *FEVER GREATER THAN 100.4 F (38 C) OR HIGHER *CHILLS OR SWEATING *NAUSEA AND VOMITING THAT IS NOT CONTROLLED WITH YOUR NAUSEA MEDICATION *UNUSUAL SHORTNESS OF BREATH *UNUSUAL BRUISING OR BLEEDING *URINARY PROBLEMS (pain or burning when urinating, or frequent urination) *BOWEL PROBLEMS (unusual diarrhea, constipation, pain near the anus) TENDERNESS IN MOUTH AND THROAT WITH OR WITHOUT PRESENCE OF ULCERS (sore throat, sores in mouth, or a toothache) UNUSUAL RASH, SWELLING OR PAIN  UNUSUAL VAGINAL DISCHARGE OR ITCHING   Items with * indicate a potential emergency and should be followed up as soon as possible or go to the Emergency Department if any problems should occur.  Please show the CHEMOTHERAPY ALERT CARD or IMMUNOTHERAPY ALERT CARD at  check-in to the Emergency Department and triage nurse.  Should you have questions after your visit or need to cancel or reschedule your appointment, please contact [facility]  Dept: [phone]  and follow the prompts.  Office hours are 8:00 a.m. to 4:30 p.m. Monday - Friday. Please note that voicemails left after 4:00 p.m. may not be returned until the following business day.  We are closed weekends and major holidays. You have access to a nurse at all times for urgent questions. Please call the main number to the clinic Dept: [phone] and follow the prompts.   For any non-urgent questions, you may also contact your provider using MyChart. We now offer e-Visits for anyone 18 and older to request care online for non-urgent symptoms. For details visit [email or web address].   Also download the MyChart app! Go to the app store, search "MyChart", open the app, select [facility], and log in with your MyChart username and password.   

## 2022-10-30 ENCOUNTER — Telehealth: Payer: Self-pay | Admitting: Adult Health

## 2022-10-30 NOTE — Telephone Encounter (Signed)
 Left patient a message in regards to scheduled appointment times/dates

## 2022-10-31 ENCOUNTER — Ambulatory Visit (HOSPITAL_COMMUNITY)
Admission: RE | Admit: 2022-10-31 | Discharge: 2022-10-31 | Disposition: A | Discharge status: Home / Self Care | Payer: Medicare HMO | Source: Ambulatory Visit | Attending: Hematology and Oncology | Admitting: Hematology and Oncology

## 2022-10-31 DIAGNOSIS — Z5181 Encounter for therapeutic drug level monitoring: Secondary | ICD-10-CM | POA: Diagnosis not present

## 2022-10-31 DIAGNOSIS — Z0189 Encounter for other specified special examinations: Secondary | ICD-10-CM

## 2022-10-31 DIAGNOSIS — Z01818 Encounter for other preprocedural examination: Secondary | ICD-10-CM | POA: Insufficient documentation

## 2022-10-31 DIAGNOSIS — Z79811 Long term (current) use of aromatase inhibitors: Secondary | ICD-10-CM | POA: Insufficient documentation

## 2022-10-31 DIAGNOSIS — Z170 Estrogen receptor positive status [ER+]: Secondary | ICD-10-CM | POA: Diagnosis not present

## 2022-10-31 DIAGNOSIS — C50212 Malignant neoplasm of upper-inner quadrant of left female breast: Secondary | ICD-10-CM | POA: Diagnosis not present

## 2022-10-31 LAB — ECHOCARDIOGRAM COMPLETE
Area-P 1/2: 4.31 cm2
Calc EF: 55.2 %
S' Lateral: 2.9 cm
Single Plane A2C EF: 54.1 %
Single Plane A4C EF: 56.6 %

## 2022-10-31 NOTE — Progress Notes (Signed)
  Echocardiogram 2D Echocardiogram has been performed.  Janalyn Harder 10/31/2022, 1:42 PM

## 2022-11-19 ENCOUNTER — Inpatient Hospital Stay: Payer: Medicare HMO | Attending: Hematology and Oncology | Admitting: Hematology and Oncology

## 2022-11-19 ENCOUNTER — Inpatient Hospital Stay: Payer: Medicare HMO

## 2022-11-19 ENCOUNTER — Encounter: Payer: Self-pay | Admitting: *Deleted

## 2022-11-19 ENCOUNTER — Encounter: Payer: Self-pay | Admitting: Hematology and Oncology

## 2022-11-19 DIAGNOSIS — Z170 Estrogen receptor positive status [ER+]: Secondary | ICD-10-CM

## 2022-11-19 DIAGNOSIS — C50212 Malignant neoplasm of upper-inner quadrant of left female breast: Secondary | ICD-10-CM | POA: Insufficient documentation

## 2022-11-19 DIAGNOSIS — Z5112 Encounter for antineoplastic immunotherapy: Secondary | ICD-10-CM | POA: Insufficient documentation

## 2022-11-19 DIAGNOSIS — Z95828 Presence of other vascular implants and grafts: Secondary | ICD-10-CM

## 2022-11-19 DIAGNOSIS — Z79811 Long term (current) use of aromatase inhibitors: Secondary | ICD-10-CM | POA: Diagnosis not present

## 2022-11-19 LAB — CMP (CANCER CENTER ONLY)
ALT: 10 U/L (ref 0–44)
AST: 16 U/L (ref 15–41)
Albumin: 4.2 g/dL (ref 3.5–5.0)
Alkaline Phosphatase: 87 U/L (ref 38–126)
Anion gap: 5 (ref 5–15)
BUN: 18 mg/dL (ref 8–23)
CO2: 29 mmol/L (ref 22–32)
Calcium: 9.7 mg/dL (ref 8.9–10.3)
Chloride: 105 mmol/L (ref 98–111)
Creatinine: 0.67 mg/dL (ref 0.44–1.00)
GFR, Estimated: >60 mL/min (ref >60)
Glucose, Bld: 95 mg/dL (ref 70–99)
Potassium: 4.4 mmol/L (ref 3.5–5.1)
Sodium: 139 mmol/L (ref 135–145)
Total Bilirubin: 0.6 mg/dL (ref 0.3–1.2)
Total Protein: 6.7 g/dL (ref 6.5–8.1)

## 2022-11-19 LAB — CBC WITH DIFFERENTIAL/PLATELET
Abs Immature Granulocytes: 0.01 10*3/uL (ref 0.00–0.07)
Basophils Absolute: 0 10*3/uL (ref 0.0–0.1)
Basophils Relative: 1 %
Eosinophils Absolute: 0.1 10*3/uL (ref 0.0–0.5)
Eosinophils Relative: 2 %
HCT: 36.5 % (ref 36.0–46.0)
Hemoglobin: 12.4 g/dL (ref 12.0–15.0)
Immature Granulocytes: 0 %
Lymphocytes Relative: 25 %
Lymphs Abs: 1.1 10*3/uL (ref 0.7–4.0)
MCH: 29.5 pg (ref 26.0–34.0)
MCHC: 34 g/dL (ref 30.0–36.0)
MCV: 86.7 fL (ref 80.0–100.0)
Monocytes Absolute: 0.5 10*3/uL (ref 0.1–1.0)
Monocytes Relative: 11 %
Neutro Abs: 2.6 10*3/uL (ref 1.7–7.7)
Neutrophils Relative %: 61 %
Platelets: 97 10*3/uL — ABNORMAL LOW (ref 150–400)
RBC: 4.21 MIL/uL (ref 3.87–5.11)
RDW: 12.5 % (ref 11.5–15.5)
WBC: 4.3 10*3/uL (ref 4.0–10.5)
nRBC: 0 % (ref 0.0–0.2)

## 2022-11-19 LAB — TECHNOLOGIST SMEAR REVIEW

## 2022-11-19 MED ORDER — SODIUM CHLORIDE 0.9% FLUSH
10.0000 mL | Freq: Once | INTRAVENOUS | Status: AC
  Administered 2022-11-19: 10 mL

## 2022-11-19 MED ORDER — HEPARIN SOD (PORK) LOCK FLUSH 100 UNIT/ML IV SOLN: 500.0000 [IU] | Freq: Once | INTRAVENOUS | Status: DC | PRN

## 2022-11-19 MED ORDER — SODIUM CHLORIDE 0.9 % IV SOLN: Freq: Once | INTRAVENOUS | Status: AC

## 2022-11-19 MED ORDER — LORATADINE 10 MG PO TABS
10.0000 mg | ORAL_TABLET | Freq: Once | ORAL | Status: AC
  Administered 2022-11-19: 10 mg via ORAL
  Filled 2022-11-19: qty 1

## 2022-11-19 MED ORDER — ACETAMINOPHEN 325 MG PO TABS
650.0000 mg | ORAL_TABLET | Freq: Once | ORAL | Status: AC
  Administered 2022-11-19: 650 mg via ORAL
  Filled 2022-11-19: qty 2

## 2022-11-19 MED ORDER — TRASTUZUMAB-ANNS CHEMO 150 MG IV SOLR
6.0000 mg/kg | Freq: Once | INTRAVENOUS | Status: AC
  Administered 2022-11-19: 336 mg via INTRAVENOUS
  Filled 2022-11-19: qty 16

## 2022-11-19 MED ORDER — SODIUM CHLORIDE 0.9% FLUSH
10.0000 mL | INTRAVENOUS | Status: DC | PRN
  Administered 2022-11-19: 10 mL

## 2022-11-19 NOTE — Progress Notes (Signed)
[name] Cancer Follow up:    [name], NP [address] [place] [address]   DIAGNOSIS:  Cancer Staging  Malignant neoplasm of upper-inner quadrant of left breast in female, estrogen receptor positive (HCC) Staging form: Breast, AJCC 8th Edition - Pathologic: Stage IIA (pT2, pN0, cM0, G3, ER+, PR-, HER2+) - Signed by Rachel Moulds, MD on 11/26/2021 Histologic grading system: 3 grade system   SUMMARY OF ONCOLOGIC HISTORY: Oncology History  Malignant neoplasm of upper-inner quadrant of left breast in female, estrogen receptor positive (HCC)  09/20/2021 Mammogram   Screening mammogram showed extremely dense breasts and a possible mass in the left breast warranting further evaluation.  Breast density category is D Diagnostic mammogram of the breast showed indeterminate mass in the 11:00 location of the left breast.  Indeterminate mass in the lower anterior left axilla.   10/04/2021 Pathology Results   Pathology showed grade 2 invasive ductal carcinoma, left axillary soft tissue needle core biopsy showed benign breast tissue with patchy changes prognostics from the breast tumor showed ER 90% positive strong staining PR 0%, negative, Ki-67 of 30% and HER2 positive for IHC 3+   10/19/2021 Initial Diagnosis   Malignant neoplasm of upper-inner quadrant of left breast in female, estrogen receptor positive (HCC)   10/23/2021 Cancer Staging   Staging form: Breast, AJCC 8th Edition - Pathologic: Stage IIA (pT2, pN0, cM0, G3, ER+, PR-, HER2+) - Signed by Rachel Moulds, MD on 11/26/2021 Histologic grading system: 3 grade system   11/10/2021 Genetic Testing   Negative hereditary cancer genetic testing: no pathogenic variants detected in [facility] CancerNext-Expanded +RNA Panel.  Report date is November 10, 2021.   The CancerNext-Expanded gene panel offered by [facility] and includes sequencing, rearrangement, and RNA analysis for the following 77 genes: AIP, ALK, APC,  ATM, AXIN2, BAP1, BARD1, BLM, BMPR1A, BRCA1, BRCA2, BRIP1, CDC73, CDH1, CDK4, CDKN1B, CDKN2A, CHEK2, CTNNA1, DICER1, FANCC, FH, FLCN, GALNT12, KIF1B, LZTR1, MAX, MEN1, MET, MLH1, MSH2, MSH3, MSH6, MUTYH, NBN, NF1, NF2, NTHL1, PALB2, PHOX2B, PMS2, POT1, PRKAR1A, PTCH1, PTEN, RAD51C, RAD51D, RB1, RECQL, RET, SDHA, SDHAF2, SDHB, SDHC, SDHD, SMAD4, SMARCA4, SMARCB1, SMARCE1, STK11, SUFU, TMEM127, TP53, TSC1, TSC2, VHL and XRCC2 (sequencing and deletion/duplication); EGFR, EGLN1, HOXB13, KIT, MITF, PDGFRA, POLD1, and POLE (sequencing only); EPCAM and GREM1 (deletion/duplication only).    11/15/2021 Surgery   Left breast lumpectomy: 2.4 cm grade 3 invasive ductal carcinoma, margins -2 sentinel lymph nodes negative.   12/10/2021 -  Chemotherapy   Patient is on Treatment Plan : BREAST Paclitaxel + Trastuzumab q7d / Trastuzumab q21d     03/26/2022 - 04/22/2022 Radiation Therapy   03/26/2022 through 04/22/2022 Site Technique Total Dose (Gy) Dose per Fx (Gy) Completed Fx Beam Energies  Breast, Left: Breast_L 3D 42.56/42.56 2.66 16/16 6X  Breast, Left: Breast_L_Bst 3D 8/8 2 4/4 6X     05/2022 -  Anti-estrogen oral therapy   Anastrozole daily     CURRENT THERAPY: Herceptin/Anastrozole  INTERVAL HISTORY:  Patricia Marshall 77 y.o. female returns for follow-up of her history of breast cancer on maintenance therapy with Herceptin and anastrozole.  She is tolerating both of these treatments quite well.    Her last echocardiogram occurred on Jul 31, 2022 demonstrating a left ventricular ejection fraction of 60 to 65% and a normal global longitudinal strain.  She also underwent bone density testing on May 20, 2022 demonstrating osteopenia with a T-score -2.3 in the right femur.  She had a few questions written today to  review, First of all neuropathy is better. LLE swelling has improving as well. She has noted some tenderness below the left rib cage, this is waxing and waning, Mr Clasby wonders if is related to  compression bra. She continues with the breast massages, finds it useful but minimally. She is taking some magnesium glycinate for bilateral knee aches. Rest of the pertinent 10 point ROS reviewed and neg.  Patient Active Problem List   Diagnosis Date Noted   Acute deep vein thrombosis (DVT) of left lower extremity (HCC) 04/23/2022   Port-A-Cath in place 02/04/2022   Genetic testing 12/07/2021   Malignant neoplasm of upper-inner quadrant of left breast in female, estrogen receptor positive (HCC) 10/19/2021   Piriformis syndrome of left side 07/12/2015   Degenerative arthritis of left knee 07/12/2015   BEE STING REACTION, LOCAL 10/24/2006    is allergic to other, ampicillin, and anesthesia [id number] [propofol].  MEDICAL HISTORY: Past Medical History:  Diagnosis Date   Arthritis    Breast cancer (HCC)    Chicken pox    High cholesterol    PONV (postoperative nausea and vomiting)    Vitamin D deficiency     SURGICAL HISTORY: Past Surgical History:  Procedure Laterality Date   APPENDECTOMY  1959   BACK SURGERY     1996   BREAST LUMPECTOMY WITH AXILLARY LYMPH NODE BIOPSY Left 11/15/2021   Procedure: LEFT BREAST LUMPECTOMY WITH AXILLARY SENTINEL LYMPH NODE BIOPSY;  Surgeon: Emelia Loron, MD;  Location: [facility];  Service: General;  Laterality: Left;   PORTACATH PLACEMENT Right 11/15/2021   Procedure: INSERTION PORT-A-CATH;  Surgeon: Emelia Loron, MD;  Location: [facility];  Service: General;  Laterality: Right;   TONSILLECTOMY AND ADENOIDECTOMY  1954    SOCIAL HISTORY: Social History   Socioeconomic History   Marital status: Married    Spouse name: Not on file   Number of children: Not on file   Years of education: Not on file   Highest education level: Not on file  Occupational History   Not on file  Tobacco Use   Smoking status: Never   Smokeless tobacco: Never  Vaping Use   Vaping status: Never Used  Substance and Sexual  Activity   Alcohol use: Yes    Alcohol/week: 1.0 standard drink of alcohol    Types: 1 Glasses of wine per week   Drug use: Never   Sexual activity: Not on file  Other Topics Concern   Not on file  Social History Narrative   Married    Retired    International aid/development worker of Corporate investment banker Strain: Low Risk  (09/11/2022)   Overall Financial Resource Strain (CARDIA)    Difficulty of Paying Living Expenses: Not hard at all  Food Insecurity: No Food Insecurity (09/11/2022)   Hunger Vital Sign    Worried About Running Out of Food in the Last Year: Never true    Ran Out of Food in the Last Year: Never true  Transportation Needs: No Transportation Needs (09/11/2022)   PRAPARE - Administrator, Civil Service (Medical): No    Lack of Transportation (Non-Medical): No  Physical Activity: Insufficiently Active (09/11/2022)   Exercise Vital Sign    Days of Exercise per Week: 3 days    Minutes of Exercise per Session: 30 min  Stress: No Stress Concern Present (09/11/2022)   [employer] of Occupational Health - Occupational Stress Questionnaire    Feeling of Stress : Not at  all  Social Connections: Socially Integrated (09/11/2022)   Social Connection and Isolation Panel [NHANES]    Frequency of Communication with Friends and Family: More than three times a week    Frequency of Social Gatherings with Friends and Family: More than three times a week    Attends Religious Services: More than 4 times per year    Active Member of [employer] or Organizations: Yes    Attends Engineer, structural: More than 4 times per year    Marital Status: Married  Catering manager Violence: Not At Risk (09/11/2022)   Humiliation, Afraid, Rape, and Kick questionnaire    Fear of Current or Ex-Partner: No    Emotionally Abused: No    Physically Abused: No    Sexually Abused: No    FAMILY HISTORY: Family History  Problem Relation Age of Onset   Stomach cancer Mother 62   Heart attack  Father 69   Cancer Sister        dx mid 24s; unknown primary w/ mets; treated like ovarian cancer   Heart attack Brother 39   Heart attack Brother 89    PHYSICAL EXAMINATION    Vitals:   11/19/22 0923  BP: (!) 157/61  Pulse: (!) 58  Resp: 18  Temp: 97.7 F (36.5 C)  SpO2: 100%     Physical Exam Constitutional:      General: She is not in acute distress.    Appearance: Normal appearance. She is not toxic-appearing.  HENT:     Head: Normocephalic and atraumatic.     Mouth/Throat:     Mouth: Mucous membranes are moist.     Pharynx: Oropharynx is clear. No oropharyngeal exudate or posterior oropharyngeal erythema.  Eyes:     General: No scleral icterus. Cardiovascular:     Rate and Rhythm: Normal rate and regular rhythm.     Pulses: Normal pulses.     Heart sounds: Normal heart sounds.  Pulmonary:     Effort: Pulmonary effort is normal.     Breath sounds: Normal breath sounds.  Chest:     Comments: Post surgical swelling in the left breast, small seroma, swelling mostly focused in the inferior portion of the left breast No new masses. No palpable concerns in the area of tenderness. No regional adenopathy. Abdominal:     General: Abdomen is flat. Bowel sounds are normal. There is no distension.     Palpations: Abdomen is soft.     Tenderness: There is no abdominal tenderness.  Musculoskeletal:        General: No swelling.     Cervical back: Neck supple.  Lymphadenopathy:     Cervical: No cervical adenopathy.  Skin:    General: Skin is warm and dry.     Findings: No rash.  Neurological:     General: No focal deficit present.     Mental Status: She is alert.  Psychiatric:        Mood and Affect: Mood normal.        Behavior: Behavior normal.     LABORATORY DATA:  CBC    Component Value Date/Time   WBC 4.3 11/19/2022 0902   RBC 4.21 11/19/2022 0902   HGB 12.4 11/19/2022 0902   HGB 12.1 06/25/2022 0820   HCT 36.5 11/19/2022 0902   HCT 36.8 06/26/2022  1120   PLT 97 (L) 11/19/2022 0902   PLT 116 (L) 06/25/2022 0820   MCV 86.7 11/19/2022 0902   MCH 29.5 11/19/2022 0902  MCHC 34.0 11/19/2022 0902   RDW 12.5 11/19/2022 0902   LYMPHSABS 1.1 11/19/2022 0902   MONOABS 0.5 11/19/2022 0902   EOSABS 0.1 11/19/2022 0902   BASOSABS 0.0 11/19/2022 0902    CMP     Component Value Date/Time   NA 139 11/19/2022 0902   K 4.4 11/19/2022 0902   CL 105 11/19/2022 0902   CO2 29 11/19/2022 0902   GLUCOSE 95 11/19/2022 0902   BUN 18 11/19/2022 0902   CREATININE 0.67 11/19/2022 0902   CREATININE 0.76 11/03/2019 0921   CALCIUM 9.7 11/19/2022 0902   PROT 6.7 11/19/2022 0902   ALBUMIN 4.2 11/19/2022 0902   AST 16 11/19/2022 0902   ALT 10 11/19/2022 0902   ALKPHOS 87 11/19/2022 0902   BILITOT 0.6 11/19/2022 0902   GFRNONAA >60 11/19/2022 0902   GFRNONAA 78 11/03/2019 0921   GFRAA 91 11/03/2019 0921      ASSESSMENT and THERAPY PLAN:   Malignant neoplasm of upper-inner quadrant of left breast in female, estrogen receptor positive (HCC) Patricia Marshall is a 77 year old woman with stage IIa ER positive, HER2 positive invasive ductal carcinoma of the left breast diagnosed in August 2023 status post left breast lumpectomy followed by adjuvant chemotherapy with Taxol and Herceptin, adjuvant radiation therapy, maintenance trastuzumab, and anastrozole.  Current treatment: Herceptin and anastrozole  Stage IIa left breast invasive ductal carcinoma: She continues on treatment with anastrozole and Herceptin. Today is last dose of herceptin, we will repeat ECHO in 6 months for early March. Continue anastrozole Left breast lymphedema Continue breast massage Left leg swelling: Most recent doppler with no evidence of DVT. No further need for this monitoring. Thrombocytopenia, repeat CBC, ANA, TSH and monitor the thrombocytopenia. She denies any bleeding issues. Last work up didn't reveal any nutritional def, hemolysis, hepatitis work up. We will also evaluate for  hypothyroidism and other autoimmune work on her next labs. We have discussed about BMB if there is further deterioration, right now her platelets are averaging about 100K. Bone density showed osteopenia. Continue weigth bearing exercises, Vit D and calcium as tolerated.  Telephone visit in 4 weeks, FU in person in approximately 6 months.    All questions were answered. The patient knows to call the clinic with any problems, questions or concerns. We can certainly see the patient much sooner if necessary.  Total encounter time:40 minutes*in face-to-face visit time, chart review, lab review, care coordination, order entry, and documentation of the encounter time.   *Total Encounter Time as defined by the Centers for Medicare and Medicaid Services includes, in addition to the face-to-face time of a patient visit (documented in the note above) non-face-to-face time: obtaining and reviewing outside history, ordering and reviewing medications, tests or procedures, care coordination (communications with other health care professionals or caregivers) and documentation in the medical record.

## 2022-11-19 NOTE — Patient Instructions (Signed)

## 2022-11-19 NOTE — Assessment & Plan Note (Signed)
Patricia Marshall is a 77 year old woman with stage IIa ER positive, HER2 positive invasive ductal carcinoma of the left breast diagnosed in August 2023 status post left breast lumpectomy followed by adjuvant chemotherapy with Taxol and Herceptin, adjuvant radiation therapy, maintenance trastuzumab, and anastrozole.  Current treatment: Herceptin and anastrozole  Stage IIa left breast invasive ductal carcinoma: She continues on treatment with anastrozole and Herceptin. Today is last dose of herceptin, we will repeat ECHO in 6 months for early March. Continue anastrozole Left breast lymphedema Continue breast massage Left leg swelling: Most recent doppler with no evidence of DVT. No further need for this monitoring. Thrombocytopenia, repeat CBC, ANA, TSH and monitor the thrombocytopenia. She denies any bleeding issues. Last work up didn't reveal any nutritional def, hemolysis, hepatitis work up. We will also evaluate for hypothyroidism and other autoimmune work on her next labs. We have discussed about BMB if there is further deterioration, right now her platelets are averaging about 100K. Bone density showed osteopenia. Continue weigth bearing exercises, Vit D and calcium as tolerated.  Telephone visit in 4 weeks, FU in person in approximately 6 months.

## 2022-11-25 ENCOUNTER — Ambulatory Visit (HOSPITAL_BASED_OUTPATIENT_CLINIC_OR_DEPARTMENT_OTHER): Admit: 2022-11-25 | Payer: Medicare HMO | Admitting: General Surgery

## 2022-11-25 ENCOUNTER — Encounter (HOSPITAL_BASED_OUTPATIENT_CLINIC_OR_DEPARTMENT_OTHER): Payer: Self-pay

## 2022-11-25 ENCOUNTER — Ambulatory Visit: Payer: Medicare HMO | Attending: General Surgery

## 2022-11-25 VITALS — Wt 126.1 lb

## 2022-11-25 DIAGNOSIS — Z483 Aftercare following surgery for neoplasm: Secondary | ICD-10-CM | POA: Insufficient documentation

## 2022-11-25 SURGERY — REMOVAL PORT-A-CATH
Anesthesia: Monitor Anesthesia Care

## 2022-11-25 NOTE — Therapy (Signed)
  OUTPATIENT PHYSICAL THERAPY SOZO SCREENING NOTE   Patient Name: Patricia Marshall MRN: [id number] DOB:Nov 24, 1945, 77 y.o., female Today's Date: 11/25/2022  PCP: [name], NP REFERRING PROVIDER: Emelia Loron, MD   PT End of Session - 11/25/22 1544     Visit Number 7   # unchanged due to screen only   PT Start Time 1541    PT Stop Time 1546    PT Time Calculation (min) 5 min    Activity Tolerance Patient tolerated treatment well    Behavior During Therapy WFL for tasks assessed/performed             Past Medical History:  Diagnosis Date   Arthritis    Breast cancer (HCC)    Chicken pox    High cholesterol    PONV (postoperative nausea and vomiting)    Vitamin D deficiency    Past Surgical History:  Procedure Laterality Date   APPENDECTOMY  1959   BACK SURGERY     1996   BREAST LUMPECTOMY WITH AXILLARY LYMPH NODE BIOPSY Left 11/15/2021   Procedure: LEFT BREAST LUMPECTOMY WITH AXILLARY SENTINEL LYMPH NODE BIOPSY;  Surgeon: Emelia Loron, MD;  Location: [facility];  Service: General;  Laterality: Left;   PORTACATH PLACEMENT Right 11/15/2021   Procedure: INSERTION PORT-A-CATH;  Surgeon: Emelia Loron, MD;  Location: [facility];  Service: General;  Laterality: Right;   TONSILLECTOMY AND ADENOIDECTOMY  1954   Patient Active Problem List   Diagnosis Date Noted   Acute deep vein thrombosis (DVT) of left lower extremity (HCC) 04/23/2022   Port-A-Cath in place 02/04/2022   Genetic testing 12/07/2021   Malignant neoplasm of upper-inner quadrant of left breast in female, estrogen receptor positive (HCC) 10/19/2021   Piriformis syndrome of left side 07/12/2015   Degenerative arthritis of left knee 07/12/2015   BEE STING REACTION, LOCAL 10/24/2006    REFERRING DIAG: left breast cancer at risk for lymphedema  THERAPY DIAG:  Aftercare following surgery for neoplasm  PERTINENT HISTORY: Patient was diagnosed on 09/20/2021 with  left grade 2 invasive ductal carcinoma breast cancer. She underwent a left lumpectomy and sentinel node biopsy (2 negative nodes) on 11/15/2021. 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   PAIN:  Are you having pain? No  SOZO SCREENING: Patient was assessed today using the SOZO machine to determine the lymphedema index score. This was compared to her baseline score. It was determined that she is within the recommended range when compared to her baseline and no further action is needed at this time. She will continue SOZO screenings. These are done every 3 months for 2 years post operatively followed by every 6 months for 2 years, and then annually.   L-DEX FLOWSHEETS - 11/25/22 1500       L-DEX LYMPHEDEMA SCREENING   Measurement Type Unilateral    L-DEX MEASUREMENT EXTREMITY Upper Extremity    POSITION  Standing    DOMINANT SIDE Right    At Risk Side Left    BASELINE SCORE (UNILATERAL) -3    L-DEX SCORE (UNILATERAL) -2.8    VALUE CHANGE (UNILAT) 0.2               Hermenia Bers, PTA 11/25/2022, 3:55 PM

## 2022-11-28 DIAGNOSIS — Z452 Encounter for adjustment and management of vascular access device: Secondary | ICD-10-CM | POA: Diagnosis not present

## 2022-12-31 ENCOUNTER — Inpatient Hospital Stay: Payer: Medicare HMO

## 2022-12-31 ENCOUNTER — Inpatient Hospital Stay: Payer: Medicare HMO | Attending: Hematology and Oncology

## 2022-12-31 ENCOUNTER — Telehealth: Payer: Self-pay

## 2022-12-31 ENCOUNTER — Other Ambulatory Visit: Payer: Self-pay

## 2022-12-31 ENCOUNTER — Inpatient Hospital Stay (HOSPITAL_BASED_OUTPATIENT_CLINIC_OR_DEPARTMENT_OTHER): Payer: Medicare HMO | Admitting: Physician Assistant

## 2022-12-31 ENCOUNTER — Ambulatory Visit (HOSPITAL_BASED_OUTPATIENT_CLINIC_OR_DEPARTMENT_OTHER)
Admission: RE | Admit: 2022-12-31 | Discharge: 2022-12-31 | Disposition: A | Discharge status: Home / Self Care | Payer: Medicare HMO | Source: Ambulatory Visit | Attending: Physician Assistant | Admitting: Physician Assistant

## 2022-12-31 ENCOUNTER — Encounter: Payer: Self-pay | Admitting: Hematology and Oncology

## 2022-12-31 ENCOUNTER — Other Ambulatory Visit (HOSPITAL_COMMUNITY): Payer: Self-pay

## 2022-12-31 VITALS — BP 162/70 | HR 61 | Temp 97.9°F | Resp 16 | Wt 125.9 lb

## 2022-12-31 DIAGNOSIS — Z79811 Long term (current) use of aromatase inhibitors: Secondary | ICD-10-CM | POA: Insufficient documentation

## 2022-12-31 DIAGNOSIS — C50212 Malignant neoplasm of upper-inner quadrant of left female breast: Secondary | ICD-10-CM | POA: Insufficient documentation

## 2022-12-31 DIAGNOSIS — M7989 Other specified soft tissue disorders: Secondary | ICD-10-CM

## 2022-12-31 DIAGNOSIS — Z79899 Other long term (current) drug therapy: Secondary | ICD-10-CM | POA: Insufficient documentation

## 2022-12-31 DIAGNOSIS — M1712 Unilateral primary osteoarthritis, left knee: Secondary | ICD-10-CM | POA: Insufficient documentation

## 2022-12-31 DIAGNOSIS — Z170 Estrogen receptor positive status [ER+]: Secondary | ICD-10-CM | POA: Insufficient documentation

## 2022-12-31 DIAGNOSIS — I824Z1 Acute embolism and thrombosis of unspecified deep veins of right distal lower extremity: Secondary | ICD-10-CM

## 2022-12-31 LAB — CBC WITH DIFFERENTIAL/PLATELET
Abs Immature Granulocytes: 0.01 10*3/uL (ref 0.00–0.07)
Basophils Absolute: 0.1 10*3/uL (ref 0.0–0.1)
Basophils Relative: 1 %
Eosinophils Absolute: 0.1 10*3/uL (ref 0.0–0.5)
Eosinophils Relative: 1 %
HCT: 37.9 % (ref 36.0–46.0)
Hemoglobin: 12.7 g/dL (ref 12.0–15.0)
Immature Granulocytes: 0 %
Lymphocytes Relative: 24 %
Lymphs Abs: 1.2 10*3/uL (ref 0.7–4.0)
MCH: 29.4 pg (ref 26.0–34.0)
MCHC: 33.5 g/dL (ref 30.0–36.0)
MCV: 87.7 fL (ref 80.0–100.0)
Monocytes Absolute: 0.4 10*3/uL (ref 0.1–1.0)
Monocytes Relative: 9 %
Neutro Abs: 3.3 10*3/uL (ref 1.7–7.7)
Neutrophils Relative %: 65 %
Platelets: 137 10*3/uL — ABNORMAL LOW (ref 150–400)
RBC: 4.32 MIL/uL (ref 3.87–5.11)
RDW: 12.3 % (ref 11.5–15.5)
WBC: 5.1 10*3/uL (ref 4.0–10.5)
nRBC: 0 % (ref 0.0–0.2)

## 2022-12-31 MED ORDER — APIXABAN (ELIQUIS) VTE STARTER PACK (10MG AND 5MG)
ORAL_TABLET | ORAL | 0 refills | Status: DC
Start: 2022-12-31 — End: 2023-03-26
  Filled 2022-12-31: qty 74, 30d supply, fill #0

## 2022-12-31 NOTE — Progress Notes (Signed)
Right lower extremity venous duplex has been completed. Preliminary results can be found in CV Proc through chart review.  Results were given to [facility] PA.  12/31/22 2:11 PM Olen Cordial RVT

## 2022-12-31 NOTE — Progress Notes (Signed)
Symptom Management Consult Note [facility]    Patient Care Team: [name], NP as PCP - General (Family Medicine) Emelia Loron, MD as Consulting Physician (General Surgery) Rachel Moulds, MD as Consulting Physician (Hematology and Oncology) Dorothy Puffer, MD as Consulting Physician (Radiation Oncology)    Name / MRN / DOB: Patricia Marshall  [id number]  Feb 06, 1946   Date of visit: 12/31/2022   Chief Complaint/Reason for visit: right leg pain and swelling   Current Therapy: Maintenance Herceptin and Anastrozole  Last treatment:  Day 1   Cycle 16 on 11/19/22   ASSESSMENT & PLAN: Patient is a 77 y.o. female with oncologic history of Malignant neoplasm of upper-inner quadrant of left breast in female, estrogen receptor positive followed by Dr. Al Pimple.  I have viewed most recent oncology note and lab work.    #Malignant neoplasm of upper-inner quadrant of left breast in female, estrogen receptor positive  -Appointment for labs today that was scheduled at previous appointment. - Next appointment with oncologist is 01/02/23   #Right leg pain -Acute. Exam with positive Homans sign. Clear lung exam. VSS. -[place] is positive for acute DVT involving right peroneal veins. Patient denying cardiac or respiratory symptoms. -CBC with platelets 137k, up from previous x 1 month ago. -Chart review shows she had acute left DVT seen on 11/19/21 after surgery. She completed 6 months of anticoagulation in March 2024. -Discussed plan with oncologist who agrees with starting eliquis. Starter pack sent to pharmacy. Discussed bleeding precautions.  Strict ED precautions discussed should symptoms worsen.   Heme/Onc History: Oncology History  Malignant neoplasm of upper-inner quadrant of left breast in female, estrogen receptor positive (HCC)  09/20/2021 Mammogram   Screening mammogram showed extremely dense breasts and a possible mass in the left breast warranting further  evaluation.  Breast density category is D Diagnostic mammogram of the breast showed indeterminate mass in the 11:00 location of the left breast.  Indeterminate mass in the lower anterior left axilla.   10/04/2021 Pathology Results   Pathology showed grade 2 invasive ductal carcinoma, left axillary soft tissue needle core biopsy showed benign breast tissue with patchy changes prognostics from the breast tumor showed ER 90% positive strong staining PR 0%, negative, Ki-67 of 30% and HER2 positive for IHC 3+   10/19/2021 Initial Diagnosis   Malignant neoplasm of upper-inner quadrant of left breast in female, estrogen receptor positive (HCC)   10/23/2021 Cancer Staging   Staging form: Breast, AJCC 8th Edition - Pathologic: Stage IIA (pT2, pN0, cM0, G3, ER+, PR-, HER2+) - Signed by Rachel Moulds, MD on 11/26/2021 Histologic grading system: 3 grade system   11/10/2021 Genetic Testing   Negative hereditary cancer genetic testing: no pathogenic variants detected in [facility] CancerNext-Expanded +RNA Panel.  Report date is November 10, 2021.   The CancerNext-Expanded gene panel offered by [facility] and includes sequencing, rearrangement, and RNA analysis for the following 77 genes: AIP, ALK, APC, ATM, AXIN2, BAP1, BARD1, BLM, BMPR1A, BRCA1, BRCA2, BRIP1, CDC73, CDH1, CDK4, CDKN1B, CDKN2A, CHEK2, CTNNA1, DICER1, FANCC, FH, FLCN, GALNT12, KIF1B, LZTR1, MAX, MEN1, MET, MLH1, MSH2, MSH3, MSH6, MUTYH, NBN, NF1, NF2, NTHL1, PALB2, PHOX2B, PMS2, POT1, PRKAR1A, PTCH1, PTEN, RAD51C, RAD51D, RB1, RECQL, RET, SDHA, SDHAF2, SDHB, SDHC, SDHD, SMAD4, SMARCA4, SMARCB1, SMARCE1, STK11, SUFU, TMEM127, TP53, TSC1, TSC2, VHL and XRCC2 (sequencing and deletion/duplication); EGFR, EGLN1, HOXB13, KIT, MITF, PDGFRA, POLD1, and POLE (sequencing only); EPCAM and GREM1 (deletion/duplication only).    11/15/2021 Surgery   Left breast lumpectomy:  Symptom Management Consult Note [facility]    Patient Care Team: [name], NP as PCP - General (Family Medicine) Emelia Loron, MD as Consulting Physician (General Surgery) Rachel Moulds, MD as Consulting Physician (Hematology and Oncology) Dorothy Puffer, MD as Consulting Physician (Radiation Oncology)    Name / MRN / DOB: Patricia Marshall  [id number]  Feb 06, 1946   Date of visit: 12/31/2022   Chief Complaint/Reason for visit: right leg pain and swelling   Current Therapy: Maintenance Herceptin and Anastrozole  Last treatment:  Day 1   Cycle 16 on 11/19/22   ASSESSMENT & PLAN: Patient is a 77 y.o. female with oncologic history of Malignant neoplasm of upper-inner quadrant of left breast in female, estrogen receptor positive followed by Dr. Al Pimple.  I have viewed most recent oncology note and lab work.    #Malignant neoplasm of upper-inner quadrant of left breast in female, estrogen receptor positive  -Appointment for labs today that was scheduled at previous appointment. - Next appointment with oncologist is 01/02/23   #Right leg pain -Acute. Exam with positive Homans sign. Clear lung exam. VSS. -[place] is positive for acute DVT involving right peroneal veins. Patient denying cardiac or respiratory symptoms. -CBC with platelets 137k, up from previous x 1 month ago. -Chart review shows she had acute left DVT seen on 11/19/21 after surgery. She completed 6 months of anticoagulation in March 2024. -Discussed plan with oncologist who agrees with starting eliquis. Starter pack sent to pharmacy. Discussed bleeding precautions.  Strict ED precautions discussed should symptoms worsen.   Heme/Onc History: Oncology History  Malignant neoplasm of upper-inner quadrant of left breast in female, estrogen receptor positive (HCC)  09/20/2021 Mammogram   Screening mammogram showed extremely dense breasts and a possible mass in the left breast warranting further  evaluation.  Breast density category is D Diagnostic mammogram of the breast showed indeterminate mass in the 11:00 location of the left breast.  Indeterminate mass in the lower anterior left axilla.   10/04/2021 Pathology Results   Pathology showed grade 2 invasive ductal carcinoma, left axillary soft tissue needle core biopsy showed benign breast tissue with patchy changes prognostics from the breast tumor showed ER 90% positive strong staining PR 0%, negative, Ki-67 of 30% and HER2 positive for IHC 3+   10/19/2021 Initial Diagnosis   Malignant neoplasm of upper-inner quadrant of left breast in female, estrogen receptor positive (HCC)   10/23/2021 Cancer Staging   Staging form: Breast, AJCC 8th Edition - Pathologic: Stage IIA (pT2, pN0, cM0, G3, ER+, PR-, HER2+) - Signed by Rachel Moulds, MD on 11/26/2021 Histologic grading system: 3 grade system   11/10/2021 Genetic Testing   Negative hereditary cancer genetic testing: no pathogenic variants detected in [facility] Cancer[facility]-Expanded +RNA Panel.  Report date is November 10, 2021.   The CancerNext-Expanded gene panel offered by [facility] and includes sequencing, rearrangement, and RNA analysis for the following 77 genes: AIP, ALK, APC, ATM, AXIN2, BAP1, BARD1, BLM, BMPR1A, BRCA1, BRCA2, BRIP1, CDC73, CDH1, CDK4, CDKN1B, CDKN2A, CHEK2, CTNNA1, DICER1, FANCC, FH, FLCN, GALNT12, KIF1B, LZTR1, MAX, MEN1, MET, MLH1, MSH2, MSH3, MSH6, MUTYH, NBN, NF1, NF2, NTHL1, PALB2, PHOX2B, PMS2, POT1, PRKAR1A, PTCH1, PTEN, RAD51C, RAD51D, RB1, RECQL, RET, SDHA, SDHAF2, SDHB, SDHC, SDHD, SMAD4, SMARCA4, SMARCB1, SMARCE1, STK11, SUFU, TMEM127, TP53, TSC1, TSC2, VHL and XRCC2 (sequencing and deletion/duplication); EGFR, EGLN1, HOXB13, KIT, MITF, PDGFRA, POLD1, and POLE (sequencing only); EPCAM and GREM1 (deletion/duplication only).    11/15/2021 Surgery   Left breast lumpectomy:  Resp: 16  Temp: 97.9 F (36.6 C)  TempSrc: Oral  SpO2: 100%  Weight: 125 lb 14.4 oz (57.1 kg)   Physical Exam Vitals and nursing note reviewed.  Constitutional:      Appearance: She is not ill-appearing or toxic-appearing.  HENT:     Head: Normocephalic.  Eyes:     Conjunctiva/sclera: Conjunctivae normal.  Cardiovascular:     Rate and Rhythm: Normal rate and regular rhythm.     Pulses: Normal pulses.     Heart sounds: Normal heart sounds.  Pulmonary:     Effort: Pulmonary effort is normal.     Breath sounds: Normal breath sounds.  Abdominal:     General: There is no distension.  Musculoskeletal:     Cervical back: Normal range of motion.     Comments: Positive Homans sign right lower extremity. Right lower extremity edema, no palpable cords. Faint erythema on right shin, no open wounds   Skin:    General: Skin is warm and dry.     Findings: No bruising.  Neurological:     Mental Status: She is alert.     Comments: Ambulatory with normal gait        LABORATORY DATA: I have reviewed the data as listed    Latest Ref Rng & Units 12/31/2022    2:12 PM 11/19/2022    9:02 AM 10/29/2022   10:26 AM  CBC  WBC 4.0 - 10.5 K/uL 5.1  4.3  5.1   Hemoglobin 12.0 - 15.0 g/dL 16.1  09.6  04.5   Hematocrit 36.0 - 46.0 % 37.9  36.5  37.4   Platelets 150 - 400 K/uL 137  97  110         Latest Ref Rng & Units 11/19/2022    9:02 AM 10/29/2022   10:26 AM 10/08/2022   11:16 AM  CMP  Glucose 70 - 99 mg/dL 95  99  99   BUN 8 - 23 mg/dL 18  21  18    Creatinine 0.44 - 1.00 mg/dL 4.09  8.11   9.14   Sodium 135 - 145 mmol/L 139  138  138   Potassium 3.5 - 5.1 mmol/L 4.4  4.6  4.4   Chloride 98 - 111 mmol/L 105  104  103   CO2 22 - 32 mmol/L 29  28  28    Calcium 8.9 - 10.3 mg/dL 9.7  9.3  9.7   Total Protein 6.5 - 8.1 g/dL 6.7  6.9  6.6   Total Bilirubin 0.3 - 1.2 mg/dL 0.6  0.5  0.5   Alkaline Phos 38 - 126 U/L 87  92  83   AST 15 - 41 U/L 16  16  12    ALT 0 - 44 U/L 10  11  8         RADIOGRAPHIC STUDIES (from last 24 hours if applicable) I have personally reviewed the radiological images as listed and agreed with the findings in the report. VAS [place] LOWER EXTREMITY VENOUS (DVT)  Result Date: 12/31/2022  Lower Venous DVT Study Patient Name:  Patricia Marshall  Date of Exam:   12/31/2022 Medical Rec #: [id number]             Accession #:    [id number] Date of Birth: 1945/08/27             Patient Gender: F Patient Age:   14 years Exam Location:  [facility] Procedure:      VAS  Symptom Management Consult Note [facility]    Patient Care Team: [name], NP as PCP - General (Family Medicine) Emelia Loron, MD as Consulting Physician (General Surgery) Rachel Moulds, MD as Consulting Physician (Hematology and Oncology) Dorothy Puffer, MD as Consulting Physician (Radiation Oncology)    Name / MRN / DOB: Patricia Marshall  [id number]  Feb 06, 1946   Date of visit: 12/31/2022   Chief Complaint/Reason for visit: right leg pain and swelling   Current Therapy: Maintenance Herceptin and Anastrozole  Last treatment:  Day 1   Cycle 16 on 11/19/22   ASSESSMENT & PLAN: Patient is a 77 y.o. female with oncologic history of Malignant neoplasm of upper-inner quadrant of left breast in female, estrogen receptor positive followed by Dr. Al Pimple.  I have viewed most recent oncology note and lab work.    #Malignant neoplasm of upper-inner quadrant of left breast in female, estrogen receptor positive  -Appointment for labs today that was scheduled at previous appointment. - Next appointment with oncologist is 01/02/23   #Right leg pain -Acute. Exam with positive Homans sign. Clear lung exam. VSS. -[place] is positive for acute DVT involving right peroneal veins. Patient denying cardiac or respiratory symptoms. -CBC with platelets 137k, up from previous x 1 month ago. -Chart review shows she had acute left DVT seen on 11/19/21 after surgery. She completed 6 months of anticoagulation in March 2024. -Discussed plan with oncologist who agrees with starting eliquis. Starter pack sent to pharmacy. Discussed bleeding precautions.  Strict ED precautions discussed should symptoms worsen.   Heme/Onc History: Oncology History  Malignant neoplasm of upper-inner quadrant of left breast in female, estrogen receptor positive (HCC)  09/20/2021 Mammogram   Screening mammogram showed extremely dense breasts and a possible mass in the left breast warranting further  evaluation.  Breast density category is D Diagnostic mammogram of the breast showed indeterminate mass in the 11:00 location of the left breast.  Indeterminate mass in the lower anterior left axilla.   10/04/2021 Pathology Results   Pathology showed grade 2 invasive ductal carcinoma, left axillary soft tissue needle core biopsy showed benign breast tissue with patchy changes prognostics from the breast tumor showed ER 90% positive strong staining PR 0%, negative, Ki-67 of 30% and HER2 positive for IHC 3+   10/19/2021 Initial Diagnosis   Malignant neoplasm of upper-inner quadrant of left breast in female, estrogen receptor positive (HCC)   10/23/2021 Cancer Staging   Staging form: Breast, AJCC 8th Edition - Pathologic: Stage IIA (pT2, pN0, cM0, G3, ER+, PR-, HER2+) - Signed by Rachel Moulds, MD on 11/26/2021 Histologic grading system: 3 grade system   11/10/2021 Genetic Testing   Negative hereditary cancer genetic testing: no pathogenic variants detected in [facility] Cancer[facility]-Expanded +RNA Panel.  Report date is November 10, 2021.   The CancerNext-Expanded gene panel offered by [facility] and includes sequencing, rearrangement, and RNA analysis for the following 77 genes: AIP, ALK, APC, ATM, AXIN2, BAP1, BARD1, BLM, BMPR1A, BRCA1, BRCA2, BRIP1, CDC73, CDH1, CDK4, CDKN1B, CDKN2A, CHEK2, CTNNA1, DICER1, FANCC, FH, FLCN, GALNT12, KIF1B, LZTR1, MAX, MEN1, MET, MLH1, MSH2, MSH3, MSH6, MUTYH, NBN, NF1, NF2, NTHL1, PALB2, PHOX2B, PMS2, POT1, PRKAR1A, PTCH1, PTEN, RAD51C, RAD51D, RB1, RECQL, RET, SDHA, SDHAF2, SDHB, SDHC, SDHD, SMAD4, SMARCA4, SMARCB1, SMARCE1, STK11, SUFU, TMEM127, TP53, TSC1, TSC2, VHL and XRCC2 (sequencing and deletion/duplication); EGFR, EGLN1, HOXB13, KIT, MITF, PDGFRA, POLD1, and POLE (sequencing only); EPCAM and GREM1 (deletion/duplication only).    11/15/2021 Surgery   Left breast lumpectomy:  Symptom Management Consult Note [facility]    Patient Care Team: [name], NP as PCP - General (Family Medicine) Emelia Loron, MD as Consulting Physician (General Surgery) Rachel Moulds, MD as Consulting Physician (Hematology and Oncology) Dorothy Puffer, MD as Consulting Physician (Radiation Oncology)    Name / MRN / DOB: Patricia Marshall  [id number]  Feb 06, 1946   Date of visit: 12/31/2022   Chief Complaint/Reason for visit: right leg pain and swelling   Current Therapy: Maintenance Herceptin and Anastrozole  Last treatment:  Day 1   Cycle 16 on 11/19/22   ASSESSMENT & PLAN: Patient is a 77 y.o. female with oncologic history of Malignant neoplasm of upper-inner quadrant of left breast in female, estrogen receptor positive followed by Dr. Al Pimple.  I have viewed most recent oncology note and lab work.    #Malignant neoplasm of upper-inner quadrant of left breast in female, estrogen receptor positive  -Appointment for labs today that was scheduled at previous appointment. - Next appointment with oncologist is 01/02/23   #Right leg pain -Acute. Exam with positive Homans sign. Clear lung exam. VSS. -[place] is positive for acute DVT involving right peroneal veins. Patient denying cardiac or respiratory symptoms. -CBC with platelets 137k, up from previous x 1 month ago. -Chart review shows she had acute left DVT seen on 11/19/21 after surgery. She completed 6 months of anticoagulation in March 2024. -Discussed plan with oncologist who agrees with starting eliquis. Starter pack sent to pharmacy. Discussed bleeding precautions.  Strict ED precautions discussed should symptoms worsen.   Heme/Onc History: Oncology History  Malignant neoplasm of upper-inner quadrant of left breast in female, estrogen receptor positive (HCC)  09/20/2021 Mammogram   Screening mammogram showed extremely dense breasts and a possible mass in the left breast warranting further  evaluation.  Breast density category is D Diagnostic mammogram of the breast showed indeterminate mass in the 11:00 location of the left breast.  Indeterminate mass in the lower anterior left axilla.   10/04/2021 Pathology Results   Pathology showed grade 2 invasive ductal carcinoma, left axillary soft tissue needle core biopsy showed benign breast tissue with patchy changes prognostics from the breast tumor showed ER 90% positive strong staining PR 0%, negative, Ki-67 of 30% and HER2 positive for IHC 3+   10/19/2021 Initial Diagnosis   Malignant neoplasm of upper-inner quadrant of left breast in female, estrogen receptor positive (HCC)   10/23/2021 Cancer Staging   Staging form: Breast, AJCC 8th Edition - Pathologic: Stage IIA (pT2, pN0, cM0, G3, ER+, PR-, HER2+) - Signed by Rachel Moulds, MD on 11/26/2021 Histologic grading system: 3 grade system   11/10/2021 Genetic Testing   Negative hereditary cancer genetic testing: no pathogenic variants detected in [facility] Cancer[facility]-Expanded +RNA Panel.  Report date is November 10, 2021.   The CancerNext-Expanded gene panel offered by [facility] and includes sequencing, rearrangement, and RNA analysis for the following 77 genes: AIP, ALK, APC, ATM, AXIN2, BAP1, BARD1, BLM, BMPR1A, BRCA1, BRCA2, BRIP1, CDC73, CDH1, CDK4, CDKN1B, CDKN2A, CHEK2, CTNNA1, DICER1, FANCC, FH, FLCN, GALNT12, KIF1B, LZTR1, MAX, MEN1, MET, MLH1, MSH2, MSH3, MSH6, MUTYH, NBN, NF1, NF2, NTHL1, PALB2, PHOX2B, PMS2, POT1, PRKAR1A, PTCH1, PTEN, RAD51C, RAD51D, RB1, RECQL, RET, SDHA, SDHAF2, SDHB, SDHC, SDHD, SMAD4, SMARCA4, SMARCB1, SMARCE1, STK11, SUFU, TMEM127, TP53, TSC1, TSC2, VHL and XRCC2 (sequencing and deletion/duplication); EGFR, EGLN1, HOXB13, KIT, MITF, PDGFRA, POLD1, and POLE (sequencing only); EPCAM and GREM1 (deletion/duplication only).    11/15/2021 Surgery   Left breast lumpectomy:  Symptom Management Consult Note [facility]    Patient Care Team: [name], NP as PCP - General (Family Medicine) Emelia Loron, MD as Consulting Physician (General Surgery) Rachel Moulds, MD as Consulting Physician (Hematology and Oncology) Dorothy Puffer, MD as Consulting Physician (Radiation Oncology)    Name / MRN / DOB: Patricia Marshall  [id number]  Feb 06, 1946   Date of visit: 12/31/2022   Chief Complaint/Reason for visit: right leg pain and swelling   Current Therapy: Maintenance Herceptin and Anastrozole  Last treatment:  Day 1   Cycle 16 on 11/19/22   ASSESSMENT & PLAN: Patient is a 77 y.o. female with oncologic history of Malignant neoplasm of upper-inner quadrant of left breast in female, estrogen receptor positive followed by Dr. Al Pimple.  I have viewed most recent oncology note and lab work.    #Malignant neoplasm of upper-inner quadrant of left breast in female, estrogen receptor positive  -Appointment for labs today that was scheduled at previous appointment. - Next appointment with oncologist is 01/02/23   #Right leg pain -Acute. Exam with positive Homans sign. Clear lung exam. VSS. -[place] is positive for acute DVT involving right peroneal veins. Patient denying cardiac or respiratory symptoms. -CBC with platelets 137k, up from previous x 1 month ago. -Chart review shows she had acute left DVT seen on 11/19/21 after surgery. She completed 6 months of anticoagulation in March 2024. -Discussed plan with oncologist who agrees with starting eliquis. Starter pack sent to pharmacy. Discussed bleeding precautions.  Strict ED precautions discussed should symptoms worsen.   Heme/Onc History: Oncology History  Malignant neoplasm of upper-inner quadrant of left breast in female, estrogen receptor positive (HCC)  09/20/2021 Mammogram   Screening mammogram showed extremely dense breasts and a possible mass in the left breast warranting further  evaluation.  Breast density category is D Diagnostic mammogram of the breast showed indeterminate mass in the 11:00 location of the left breast.  Indeterminate mass in the lower anterior left axilla.   10/04/2021 Pathology Results   Pathology showed grade 2 invasive ductal carcinoma, left axillary soft tissue needle core biopsy showed benign breast tissue with patchy changes prognostics from the breast tumor showed ER 90% positive strong staining PR 0%, negative, Ki-67 of 30% and HER2 positive for IHC 3+   10/19/2021 Initial Diagnosis   Malignant neoplasm of upper-inner quadrant of left breast in female, estrogen receptor positive (HCC)   10/23/2021 Cancer Staging   Staging form: Breast, AJCC 8th Edition - Pathologic: Stage IIA (pT2, pN0, cM0, G3, ER+, PR-, HER2+) - Signed by Rachel Moulds, MD on 11/26/2021 Histologic grading system: 3 grade system   11/10/2021 Genetic Testing   Negative hereditary cancer genetic testing: no pathogenic variants detected in [facility] Cancer[facility]-Expanded +RNA Panel.  Report date is November 10, 2021.   The CancerNext-Expanded gene panel offered by [facility] and includes sequencing, rearrangement, and RNA analysis for the following 77 genes: AIP, ALK, APC, ATM, AXIN2, BAP1, BARD1, BLM, BMPR1A, BRCA1, BRCA2, BRIP1, CDC73, CDH1, CDK4, CDKN1B, CDKN2A, CHEK2, CTNNA1, DICER1, FANCC, FH, FLCN, GALNT12, KIF1B, LZTR1, MAX, MEN1, MET, MLH1, MSH2, MSH3, MSH6, MUTYH, NBN, NF1, NF2, NTHL1, PALB2, PHOX2B, PMS2, POT1, PRKAR1A, PTCH1, PTEN, RAD51C, RAD51D, RB1, RECQL, RET, SDHA, SDHAF2, SDHB, SDHC, SDHD, SMAD4, SMARCA4, SMARCB1, SMARCE1, STK11, SUFU, TMEM127, TP53, TSC1, TSC2, VHL and XRCC2 (sequencing and deletion/duplication); EGFR, EGLN1, HOXB13, KIT, MITF, PDGFRA, POLD1, and POLE (sequencing only); EPCAM and GREM1 (deletion/duplication only).    11/15/2021 Surgery   Left breast lumpectomy:

## 2022-12-31 NOTE — Telephone Encounter (Signed)
Pt called and states she has RLE edema/redness/swelling. She has a hx of DVT to her LLE. Per MD, pt needs to be seen by [facility]. Order placed for RLE doppler per PA and she was scheduled for doppler at 2 PM then f/u with Jae Dire, PA at 3, following her 1:30 PM lab appt. Pt is in agreement with this plan.

## 2023-01-01 LAB — ANTINUCLEAR ANTIBODIES, IFA: ANA Ab, IFA: NEGATIVE

## 2023-01-01 LAB — TSH: TSH: 2.459 u[IU]/mL (ref 0.350–4.500)

## 2023-01-02 ENCOUNTER — Inpatient Hospital Stay: Payer: Medicare HMO | Admitting: Hematology and Oncology

## 2023-01-02 DIAGNOSIS — Z170 Estrogen receptor positive status [ER+]: Secondary | ICD-10-CM | POA: Diagnosis not present

## 2023-01-02 DIAGNOSIS — C50212 Malignant neoplasm of upper-inner quadrant of left female breast: Secondary | ICD-10-CM

## 2023-01-02 NOTE — Progress Notes (Signed)
[facility] Cancer Follow up:    [name], NP [address] [place] [address]   DIAGNOSIS:  Cancer Staging  Malignant neoplasm of upper-inner quadrant of left breast in female, estrogen receptor positive (HCC) Staging form: Breast, AJCC 8th Edition - Pathologic: Stage IIA (pT2, pN0, cM0, G3, ER+, PR-, HER2+) - Signed by Rachel Moulds, MD on 11/26/2021 Histologic grading system: 3 grade system   SUMMARY OF ONCOLOGIC HISTORY: Oncology History  Malignant neoplasm of upper-inner quadrant of left breast in female, estrogen receptor positive (HCC)  09/20/2021 Mammogram   Screening mammogram showed extremely dense breasts and a possible mass in the left breast warranting further evaluation.  Breast density category is D Diagnostic mammogram of the breast showed indeterminate mass in the 11:00 location of the left breast.  Indeterminate mass in the lower anterior left axilla.   10/04/2021 Pathology Results   Pathology showed grade 2 invasive ductal carcinoma, left axillary soft tissue needle core biopsy showed benign breast tissue with patchy changes prognostics from the breast tumor showed ER 90% positive strong staining PR 0%, negative, Ki-67 of 30% and HER2 positive for IHC 3+   10/19/2021 Initial Diagnosis   Malignant neoplasm of upper-inner quadrant of left breast in female, estrogen receptor positive (HCC)   10/23/2021 Cancer Staging   Staging form: Breast, AJCC 8th Edition - Pathologic: Stage IIA (pT2, pN0, cM0, G3, ER+, PR-, HER2+) - Signed by Rachel Moulds, MD on 11/26/2021 Histologic grading system: 3 grade system   11/10/2021 Genetic Testing   Negative hereditary cancer genetic testing: no pathogenic variants detected in [facility] CancerNext-Expanded +RNA Panel.  Report date is November 10, 2021.   The CancerNext-Expanded gene panel offered by [facility] and includes sequencing, rearrangement, and RNA analysis for the following 77 genes: AIP, ALK, APC,  ATM, AXIN2, BAP1, BARD1, BLM, BMPR1A, BRCA1, BRCA2, BRIP1, CDC73, CDH1, CDK4, CDKN1B, CDKN2A, CHEK2, CTNNA1, DICER1, FANCC, FH, FLCN, GALNT12, KIF1B, LZTR1, MAX, MEN1, MET, MLH1, MSH2, MSH3, MSH6, MUTYH, NBN, NF1, NF2, NTHL1, PALB2, PHOX2B, PMS2, POT1, PRKAR1A, PTCH1, PTEN, RAD51C, RAD51D, RB1, RECQL, RET, SDHA, SDHAF2, SDHB, SDHC, SDHD, SMAD4, SMARCA4, SMARCB1, SMARCE1, STK11, SUFU, TMEM127, TP53, TSC1, TSC2, VHL and XRCC2 (sequencing and deletion/duplication); EGFR, EGLN1, HOXB13, KIT, MITF, PDGFRA, POLD1, and POLE (sequencing only); EPCAM and GREM1 (deletion/duplication only).    11/15/2021 Surgery   Left breast lumpectomy: 2.4 cm grade 3 invasive ductal carcinoma, margins -2 sentinel lymph nodes negative.   12/10/2021 -  Chemotherapy   Patient is on Treatment Plan : BREAST Paclitaxel + Trastuzumab q7d / Trastuzumab q21d     03/26/2022 - 04/22/2022 Radiation Therapy   03/26/2022 through 04/22/2022 Site Technique Total Dose (Gy) Dose per Fx (Gy) Completed Fx Beam Energies  Breast, Left: Breast_L 3D 42.56/42.56 2.66 16/16 6X  Breast, Left: Breast_L_Bst 3D 8/8 2 4/4 6X     05/2022 -  Anti-estrogen oral therapy   Anastrozole daily     CURRENT THERAPY: Herceptin/Anastrozole  INTERVAL HISTORY:  Patricia Marshall 77 y.o. female returns for telephone follow up since the new diagnosis of DVT. She recently called about RLE pain and tenderness, skin changes, had Vasc US which showed DVT of peroneal vein, hence restarted on Eliquis. She had a few more questions hence scheduled this apt. She is hoping to go to [facility] this weekend. She also has a GI apt this November and a colonoscopy likely this Dec, she was wondering if this need to be rescheduled now that she is back on Eliquis.  She also wonders why she is getting DVT's recurrently. She did not report any chest pain, SOB. Rest of the pertinent 10 point ROS reviewed and neg.   Patient Active Problem List   Diagnosis Date Noted   Acute deep vein  thrombosis (DVT) of left lower extremity (HCC) 04/23/2022   Port-A-Cath in place 02/04/2022   Genetic testing 12/07/2021   Malignant neoplasm of upper-inner quadrant of left breast in female, estrogen receptor positive (HCC) 10/19/2021   Piriformis syndrome of left side 07/12/2015   Degenerative arthritis of left knee 07/12/2015   BEE STING REACTION, LOCAL 10/24/2006    is allergic to other, ampicillin, and anesthesia s-i-40 [propofol].  MEDICAL HISTORY: Past Medical History:  Diagnosis Date   Arthritis    Breast cancer (HCC)    Chicken pox    High cholesterol    PONV (postoperative nausea and vomiting)    Vitamin D deficiency     SURGICAL HISTORY: Past Surgical History:  Procedure Laterality Date   APPENDECTOMY  1959   BACK SURGERY     1996   BREAST LUMPECTOMY WITH AXILLARY LYMPH NODE BIOPSY Left 11/15/2021   Procedure: LEFT BREAST LUMPECTOMY WITH AXILLARY SENTINEL LYMPH NODE BIOPSY;  Surgeon: Emelia Loron, MD;  Location: [facility];  Service: General;  Laterality: Left;   PORTACATH PLACEMENT Right 11/15/2021   Procedure: INSERTION PORT-A-CATH;  Surgeon: Emelia Loron, MD;  Location: [facility];  Service: General;  Laterality: Right;   TONSILLECTOMY AND ADENOIDECTOMY  1954    SOCIAL HISTORY: Social History   Socioeconomic History   Marital status: Married    Spouse name: Not on file   Number of children: Not on file   Years of education: Not on file   Highest education level: Not on file  Occupational History   Not on file  Tobacco Use   Smoking status: Never   Smokeless tobacco: Never  Vaping Use   Vaping status: Never Used  Substance and Sexual Activity   Alcohol use: Yes    Alcohol/week: 1.0 standard drink of alcohol    Types: 1 Glasses of wine per week   Drug use: Never   Sexual activity: Not on file  Other Topics Concern   Not on file  Social History Narrative   Married    Retired    International aid/development worker of  Corporate investment banker Strain: Low Risk  (09/11/2022)   Overall Financial Resource Strain (CARDIA)    Difficulty of Paying Living Expenses: Not hard at all  Food Insecurity: No Food Insecurity (09/11/2022)   Hunger Vital Sign    Worried About Running Out of Food in the Last Year: Never true    Ran Out of Food in the Last Year: Never true  Transportation Needs: No Transportation Needs (09/11/2022)   PRAPARE - Administrator, Civil Service (Medical): No    Lack of Transportation (Non-Medical): No  Physical Activity: Insufficiently Active (09/11/2022)   Exercise Vital Sign    Days of Exercise per Week: 3 days    Minutes of Exercise per Session: 30 min  Stress: No Stress Concern Present (09/11/2022)   [employer] of Occupational Health - Occupational Stress Questionnaire    Feeling of Stress : Not at all  Social Connections: Socially Integrated (09/11/2022)   Social Connection and Isolation Panel [NHANES]    Frequency of Communication with Friends and Family: More than three times a week    Frequency of Social Gatherings with Friends  and Family: More than three times a week    Attends Religious Services: More than 4 times per year    Active Member of Clubs or Organizations: Yes    Attends Banker Meetings: More than 4 times per year    Marital Status: Married  Catering manager Violence: Not At Risk (09/11/2022)   Humiliation, Afraid, Rape, and Kick questionnaire    Fear of Current or Ex-Partner: No    Emotionally Abused: No    Physically Abused: No    Sexually Abused: No    FAMILY HISTORY: Family History  Problem Relation Age of Onset   Stomach cancer Mother 94   Heart attack Father 13   Cancer Sister        dx mid 63s; unknown primary w/ mets; treated like ovarian cancer   Heart attack Brother 79   Heart attack Brother 33    PHYSICAL EXAMINATION  There were no vitals taken for this visit.  She sounded well today Rest of the PE deferred,  telephone visit.   LABORATORY DATA:  CBC    Component Value Date/Time   WBC 5.1 12/31/2022 1412   RBC 4.32 12/31/2022 1412   HGB 12.7 12/31/2022 1412   HGB 12.1 06/25/2022 0820   HCT 37.9 12/31/2022 1412   HCT 36.8 06/26/2022 1120   PLT 137 (L) 12/31/2022 1412   PLT 116 (L) 06/25/2022 0820   MCV 87.7 12/31/2022 1412   MCH 29.4 12/31/2022 1412   MCHC 33.5 12/31/2022 1412   RDW 12.3 12/31/2022 1412   LYMPHSABS 1.2 12/31/2022 1412   MONOABS 0.4 12/31/2022 1412   EOSABS 0.1 12/31/2022 1412   BASOSABS 0.1 12/31/2022 1412    CMP     Component Value Date/Time   NA 139 11/19/2022 0902   K 4.4 11/19/2022 0902   CL 105 11/19/2022 0902   CO2 29 11/19/2022 0902   GLUCOSE 95 11/19/2022 0902   BUN 18 11/19/2022 0902   CREATININE 0.67 11/19/2022 0902   CREATININE 0.76 11/03/2019 0921   CALCIUM 9.7 11/19/2022 0902   PROT 6.7 11/19/2022 0902   ALBUMIN 4.2 11/19/2022 0902   AST 16 11/19/2022 0902   ALT 10 11/19/2022 0902   ALKPHOS 87 11/19/2022 0902   BILITOT 0.6 11/19/2022 0902   GFRNONAA >60 11/19/2022 0902   GFRNONAA 78 11/03/2019 0921   GFRAA 91 11/03/2019 0921    ASSESSMENT and THERAPY PLAN:   Malignant neoplasm of upper-inner quadrant of left breast in female, estrogen receptor positive (HCC) Patricia Marshall is a 77 year old woman with stage IIa ER positive, HER2 positive invasive ductal carcinoma of the left breast diagnosed in August 2023 status post left breast lumpectomy followed by adjuvant chemotherapy with Taxol and Herceptin, adjuvant radiation therapy, maintenance trastuzumab, and anastrozole.  Current treatment: Herceptin and anastrozole  Stage IIa left breast invasive ductal carcinoma: She had adj Taxol and herceptin followed by herceptin maintenance and is now on anastrozole. Right leg swelling : Acute DVT peroneal vein, restart eliquis. Will pursue hypercoag work up. At this point, I might recommend to continue this indefinitely. Thrombocytopenia, improved, near  normal.We have discussed about BMB if there is worsening of thrombocytopenia. Bone density showed osteopenia. Continue weigth bearing exercises, Vit D and calcium as tolerated.  FU with me in Dec.     All questions were answered. The patient knows to call the clinic with any problems, questions or concerns. We can certainly see the patient much sooner if necessary.  Total encounter time:15 minutes  I connected with  Alleen Borne on 01/02/23 by a telephone application and verified that I am speaking with the correct person using two identifiers.   I discussed the limitations of evaluation and management by telemedicine. The patient expressed understanding and agreed to proceed.   *Total Encounter Time as defined by the Centers for Medicare and Medicaid Services includes, in addition to the face-to-face time of a patient visit (documented in the note above) non-face-to-face time: obtaining and reviewing outside history, ordering and reviewing medications, tests or procedures, care coordination (communications with other health care professionals or caregivers) and documentation in the medical record.

## 2023-01-02 NOTE — Assessment & Plan Note (Addendum)
Patricia Marshall is a 77 year old woman with stage IIa ER positive, HER2 positive invasive ductal carcinoma of the left breast diagnosed in August 2023 status post left breast lumpectomy followed by adjuvant chemotherapy with Taxol and Herceptin, adjuvant radiation therapy, maintenance trastuzumab, and anastrozole.  Current treatment: Herceptin and anastrozole  Stage IIa left breast invasive ductal carcinoma: She had adj Taxol and herceptin followed by herceptin maintenance and is now on anastrozole. Right leg swelling : Acute DVT peroneal vein, restart eliquis. Will pursue hypercoag work up. At this point, I might recommend to continue this indefinitely. Thrombocytopenia, improved, near normal.We have discussed about BMB if there is worsening of thrombocytopenia. Bone density showed osteopenia. Continue weigth bearing exercises, Vit D and calcium as tolerated.  FU with me in Dec.

## 2023-01-06 ENCOUNTER — Telehealth: Payer: Self-pay

## 2023-01-06 NOTE — Telephone Encounter (Signed)
Pt LVM to inquire if she needed to "stop taking my blood thinner" prior to her dental cleaning appt on Thursday.

## 2023-01-07 ENCOUNTER — Telehealth: Payer: Self-pay | Admitting: *Deleted

## 2023-01-07 NOTE — Telephone Encounter (Signed)
This RN spoke with pt per her call regarding if it is ok to proceed with dental cleaning scheduled for this Thursday- her dental hygienist -asked for verification due to being on Eliquis.  Informed pt per md no restrictions with medication and dental cleaning.

## 2023-01-08 ENCOUNTER — Encounter: Payer: Self-pay | Admitting: Hematology and Oncology

## 2023-01-09 ENCOUNTER — Encounter: Payer: Self-pay | Admitting: Hematology and Oncology

## 2023-01-15 ENCOUNTER — Encounter: Payer: Self-pay | Admitting: Adult Health

## 2023-01-15 ENCOUNTER — Ambulatory Visit (INDEPENDENT_AMBULATORY_CARE_PROVIDER_SITE_OTHER): Payer: Medicare HMO | Admitting: Adult Health

## 2023-01-15 VITALS — BP 110/60 | HR 60 | Temp 98.0°F | Ht 65.0 in | Wt 124.0 lb

## 2023-01-15 DIAGNOSIS — E785 Hyperlipidemia, unspecified: Secondary | ICD-10-CM

## 2023-01-15 DIAGNOSIS — C50212 Malignant neoplasm of upper-inner quadrant of left female breast: Secondary | ICD-10-CM | POA: Diagnosis not present

## 2023-01-15 DIAGNOSIS — E559 Vitamin D deficiency, unspecified: Secondary | ICD-10-CM

## 2023-01-15 DIAGNOSIS — M2550 Pain in unspecified joint: Secondary | ICD-10-CM | POA: Diagnosis not present

## 2023-01-15 DIAGNOSIS — I82432 Acute embolism and thrombosis of left popliteal vein: Secondary | ICD-10-CM | POA: Diagnosis not present

## 2023-01-15 DIAGNOSIS — Z0000 Encounter for general adult medical examination without abnormal findings: Secondary | ICD-10-CM

## 2023-01-15 DIAGNOSIS — Z170 Estrogen receptor positive status [ER+]: Secondary | ICD-10-CM

## 2023-01-15 LAB — LIPID PANEL
Cholesterol: 252 mg/dL — ABNORMAL HIGH (ref 0–200)
HDL: 68.2 mg/dL (ref >39.00)
LDL Cholesterol: 165 mg/dL — ABNORMAL HIGH (ref 0–99)
NonHDL: 183.79
Total CHOL/HDL Ratio: 4
Triglycerides: 92 mg/dL (ref 0.0–149.0)
VLDL: 18.4 mg/dL (ref 0.0–40.0)

## 2023-01-15 LAB — COMPREHENSIVE METABOLIC PANEL
ALT: 14 U/L (ref 0–35)
AST: 22 U/L (ref 0–37)
Albumin: 4.7 g/dL (ref 3.5–5.2)
Alkaline Phosphatase: 98 U/L (ref 39–117)
BUN: 19 mg/dL (ref 6–23)
CO2: 25 meq/L (ref 19–32)
Calcium: 10.2 mg/dL (ref 8.4–10.5)
Chloride: 102 meq/L (ref 96–112)
Creatinine, Ser: 0.75 mg/dL (ref 0.40–1.20)
GFR: 77.53 mL/min (ref >60.00)
Glucose, Bld: 85 mg/dL (ref 70–99)
Potassium: 4.2 meq/L (ref 3.5–5.1)
Sodium: 138 meq/L (ref 135–145)
Total Bilirubin: 0.8 mg/dL (ref 0.2–1.2)
Total Protein: 7.9 g/dL (ref 6.0–8.3)

## 2023-01-15 LAB — CBC WITH DIFFERENTIAL/PLATELET
Basophils Absolute: 0 10*3/uL (ref 0.0–0.1)
Basophils Relative: 0.7 % (ref 0.0–3.0)
Eosinophils Absolute: 0.1 10*3/uL (ref 0.0–0.7)
Eosinophils Relative: 1.2 % (ref 0.0–5.0)
HCT: 42.9 % (ref 36.0–46.0)
Hemoglobin: 13.8 g/dL (ref 12.0–15.0)
Lymphocytes Relative: 29.7 % (ref 12.0–46.0)
Lymphs Abs: 1.4 10*3/uL (ref 0.7–4.0)
MCHC: 32.2 g/dL (ref 30.0–36.0)
MCV: 88.2 fL (ref 78.0–100.0)
Monocytes Absolute: 0.4 10*3/uL (ref 0.1–1.0)
Monocytes Relative: 9.5 % (ref 3.0–12.0)
Neutro Abs: 2.7 10*3/uL (ref 1.4–7.7)
Neutrophils Relative %: 58.9 % (ref 43.0–77.0)
Platelets: 158 10*3/uL (ref 150.0–400.0)
RBC: 4.86 Mil/uL (ref 3.87–5.11)
RDW: 13 % (ref 11.5–15.5)
WBC: 4.6 10*3/uL (ref 4.0–10.5)

## 2023-01-15 LAB — VITAMIN D 25 HYDROXY (VIT D DEFICIENCY, FRACTURES): VITD: 35.9 ng/mL (ref 30.00–100.00)

## 2023-01-15 NOTE — Progress Notes (Addendum)
Subjective:    Patient ID: Patricia Marshall, female    DOB: 1946/02/11, 77 y.o.   MRN: [id number]  HPI Patient presents for yearly preventative medicine examination. He is a pleasant 77 year old female who  has a past medical history of Arthritis, Breast cancer (HCC), Chicken pox, High cholesterol, PONV (postoperative nausea and vomiting), and Vitamin D deficiency.  Breast Cancer -she was diagnosed on July 6th 2023 with INVASIVE DUCTAL CARCINOMA, HER 2 + of left breast.  She had a left breast lumpectomy in August 2023.  Currently managed with Anastrozole. She does reports that she is suffering from multiple joint pain. She would like to go to PT   Vitamin D Deficiency - takes OTC vitamin D supplements  Last vitamin D Last vitamin D Lab Results  Component Value Date   VD25OH 43.33 10/09/2021    Hyperlipidemia -not currently on medication.  It was recommended that she start a statin in 2021 due to 10-year cardiac risk score of 12%. Lab Results  Component Value Date   CHOL 217 (H) 10/09/2021   HDL 63.90 10/09/2021   LDLCALC 135 (H) 10/09/2021   TRIG 94.0 10/09/2021   CHOLHDL 3 10/09/2021   History of DVT -most recently had pain in her right leg.  Ultrasound was consistent with acute deep vein thrombosis.  She was restarted on Eliquis indefinitely.  All immunizations and health maintenance protocols were reviewed with the patient and needed orders were placed.  Appropriate screening laboratory values were ordered for the patient including screening of hyperlipidemia, renal function and hepatic function.   Medication reconciliation,  past medical history, social history, problem list and allergies were reviewed in detail with the patient  Goals were established with regard to weight loss, exercise, and  diet in compliance with medications  Review of Systems  Constitutional: Negative.   HENT: Negative.    Eyes: Negative.   Respiratory: Negative.    Cardiovascular:  Positive  for leg swelling.  Gastrointestinal: Negative.   Endocrine: Negative.   Genitourinary: Negative.   Musculoskeletal:  Positive for arthralgias.  Skin: Negative.   Allergic/Immunologic: Negative.   Neurological: Negative.   Hematological: Negative.   Psychiatric/Behavioral: Negative.     Past Medical History:  Diagnosis Date   Arthritis    Breast cancer (HCC)    Chicken pox    High cholesterol    PONV (postoperative nausea and vomiting)    Vitamin D deficiency     Social History   Socioeconomic History   Marital status: Married    Spouse name: Not on file   Number of children: Not on file   Years of education: Not on file   Highest education level: Not on file  Occupational History   Not on file  Tobacco Use   Smoking status: Never   Smokeless tobacco: Never  Vaping Use   Vaping status: Never Used  Substance and Sexual Activity   Alcohol use: Yes    Alcohol/week: 1.0 standard drink of alcohol    Types: 1 Glasses of wine per week   Drug use: Never   Sexual activity: Not on file  Other Topics Concern   Not on file  Social History Narrative   Married    Retired    International aid/development worker of Health   Financial Resource Strain: Low Risk  (09/11/2022)   Overall Financial Resource Strain (CARDIA)    Difficulty of Paying Living Expenses: Not hard at all  Food Insecurity: No Food Insecurity (09/11/2022)  Hunger Vital Sign    Worried About Running Out of Food in the Last Year: Never true    Ran Out of Food in the Last Year: Never true  Transportation Needs: No Transportation Needs (09/11/2022)   PRAPARE - Administrator, Civil Service (Medical): No    Lack of Transportation (Non-Medical): No  Physical Activity: Insufficiently Active (09/11/2022)   Exercise Vital Sign    Days of Exercise per Week: 3 days    Minutes of Exercise per Session: 30 min  Stress: No Stress Concern Present (09/11/2022)   [employer] of Occupational Health - Occupational Stress  Questionnaire    Feeling of Stress : Not at all  Social Connections: Socially Integrated (09/11/2022)   Social Connection and Isolation Panel [NHANES]    Frequency of Communication with Friends and Family: More than three times a week    Frequency of Social Gatherings with Friends and Family: More than three times a week    Attends Religious Services: More than 4 times per year    Active Member of [employer] or Organizations: Yes    Attends Banker Meetings: More than 4 times per year    Marital Status: Married  Catering manager Violence: Not At Risk (09/11/2022)   Humiliation, Afraid, Rape, and Kick questionnaire    Fear of Current or Ex-Partner: No    Emotionally Abused: No    Physically Abused: No    Sexually Abused: No    Past Surgical History:  Procedure Laterality Date   APPENDECTOMY  1959   BACK SURGERY     1996   BREAST LUMPECTOMY WITH AXILLARY LYMPH NODE BIOPSY Left 11/15/2021   Procedure: LEFT BREAST LUMPECTOMY WITH AXILLARY SENTINEL LYMPH NODE BIOPSY;  Surgeon: Emelia Loron, MD;  Location: [facility];  Service: General;  Laterality: Left;   PORTACATH PLACEMENT Right 11/15/2021   Procedure: INSERTION PORT-A-CATH;  Surgeon: Emelia Loron, MD;  Location: [facility];  Service: General;  Laterality: Right;   TONSILLECTOMY AND ADENOIDECTOMY  1954    Family History  Problem Relation Age of Onset   Stomach cancer Mother 61   Heart attack Father 8   Cancer Sister        dx mid 36s; unknown primary w/ mets; treated like ovarian cancer   Heart attack Brother 55   Heart attack Brother 97    Allergies  Allergen Reactions   Other Other (See Comments)    SPICEY FOODS- gagging,cough,burning sensation   Ampicillin Other (See Comments)    Drug fever Did it involve swelling of the face/tongue/throat, SOB, or low BP? No Did it involve sudden or severe rash/hives, skin peeling, or any reaction on the inside of your mouth or nose?  No Did you need to seek medical attention at a hospital or doctor's office? No When did it last happen? Over 10 years ago If all above answers are "NO", may proceed with cephalosporin use.   Anesthesia S-I-40 [Propofol] Nausea And Vomiting    Current Outpatient Medications on File Prior to Visit  Medication Sig Dispense Refill   anastrozole (ARIMIDEX) 1 MG tablet Take 1 tablet (1 mg total) by mouth daily. 90 tablet 3   APIXABAN (ELIQUIS) VTE STARTER PACK (10MG  AND 5MG ) Take as directed on package: start with two-5mg  tablets twice daily for 7 days. On day 8, switch to one-5mg  tablet twice daily. 74 each 0   Cholecalciferol (VITAMIN D) 50 MCG (2000 UT) tablet Take 2,000 Units by mouth  daily.     lidocaine-prilocaine (EMLA) cream Apply to affected area once 30 g 3   MAGNESIUM GLYCINATE PO Take 200 mg by mouth daily.     No current facility-administered medications on file prior to visit.    There were no vitals taken for this visit.      Objective:   Physical Exam Vitals and nursing note reviewed.  Constitutional:      General: She is not in acute distress.    Appearance: Normal appearance. She is not ill-appearing.  HENT:     Head: Normocephalic and atraumatic.     Right Ear: Tympanic membrane, ear canal and external ear normal. There is no impacted cerumen.     Left Ear: Tympanic membrane, ear canal and external ear normal. There is no impacted cerumen.     Nose: Nose normal. No congestion or rhinorrhea.     Mouth/Throat:     Mouth: Mucous membranes are moist.     Pharynx: Oropharynx is clear.  Eyes:     Extraocular Movements: Extraocular movements intact.     Conjunctiva/sclera: Conjunctivae normal.     Pupils: Pupils are equal, round, and reactive to light.  Neck:     Vascular: No carotid bruit.  Cardiovascular:     Rate and Rhythm: Normal rate and regular rhythm.     Pulses: Normal pulses.     Heart sounds: No murmur heard.    No friction rub. No gallop.  Pulmonary:      Effort: Pulmonary effort is normal.     Breath sounds: Normal breath sounds.  Abdominal:     General: Abdomen is flat. Bowel sounds are normal. There is no distension.     Palpations: Abdomen is soft. There is no mass.     Tenderness: There is no abdominal tenderness. There is no guarding or rebound.     Hernia: No hernia is present.  Musculoskeletal:        General: Normal range of motion.     Cervical back: Normal range of motion and neck supple.     Right lower leg: Edema (trace pitting edema noted) present.  Lymphadenopathy:     Cervical: No cervical adenopathy.  Skin:    General: Skin is warm and dry.     Capillary Refill: Capillary refill takes less than 2 seconds.  Neurological:     General: No focal deficit present.     Mental Status: She is alert and oriented to person, place, and time.  Psychiatric:        Mood and Affect: Mood normal.        Behavior: Behavior normal.        Thought Content: Thought content normal.        Judgment: Judgment normal.       Assessment & Plan:  1. Routine general medical examination at a health care facility Today patient counseled on age appropriate routine health concerns for screening and prevention, each reviewed and up to date or declined. Immunizations reviewed and up to date or declined. Labs ordered and reviewed. Risk factors for depression reviewed and negative. Hearing function and visual acuity are intact. ADLs screened and addressed as needed. Functional ability and level of safety reviewed and appropriate. Education, counseling and referrals performed based on assessed risks today. Patient provided with a copy of personalized plan for preventive services. - Follow up in one year or sooner if needed  - Continue to eat healthy and stay active   2. Malignant neoplasm  of upper-inner quadrant of left breast in female, estrogen receptor positive (HCC) - Per oncology  - CBC with Differential/Platelet; Future - Comprehensive  metabolic panel; Future - Lipid panel; Future - Lipid panel - Comprehensive metabolic panel - CBC with Differential/Platelet  3. Vitamin D deficiency  - VITAMIN D 25 Hydroxy (Vit-D Deficiency, Fractures); Future - VITAMIN D 25 Hydroxy (Vit-D Deficiency, Fractures)  4. Hyperlipidemia, unspecified hyperlipidemia type -  Likely need to start a statin  - CBC with Differential/Platelet; Future - Comprehensive metabolic panel; Future - Lipid panel; Future - Lipid panel - Comprehensive metabolic panel - CBC with Differential/Platelet  5. Acute deep vein thrombosis (DVT) of popliteal vein of left lower extremity (HCC) - Per oncology  - CBC with Differential/Platelet; Future - Comprehensive metabolic panel; Future - Lipid panel; Future - Lipid panel - Comprehensive metabolic panel - CBC with Differential/Platelet  6. Multiple joint pain  - Ambulatory referral to Physical Therapy

## 2023-01-15 NOTE — Patient Instructions (Signed)
It was great seeing you today   We will follow up with you regarding your lab work   Please let me know if you need anything   Someone from PT will call you to schedule you

## 2023-01-17 ENCOUNTER — Telehealth: Payer: Self-pay | Admitting: *Deleted

## 2023-01-17 NOTE — Telephone Encounter (Signed)
This RN returned VM from pt regarding if ok to proceed with PT per discussion with her primary care visit due to recent DVT.  Obtained number verified VM- message left ok to proceed with PT as ordered for joint discomfort- concern states to avoid deep tissue massages.

## 2023-01-20 ENCOUNTER — Telehealth: Payer: Self-pay

## 2023-01-20 NOTE — Telephone Encounter (Signed)
This RN called pt to inform her that per Dr. Al Pimple she is OK to proceed with PT of arms, shoulders, and hip flexors. Pt also wanted to know if she would be able to continue lymphedema massages on her upper arm and this RN informed her she is OK to proceed.   Pt inquired about receiving the flu vaccination and if it is recommended. This RN assured pt that she is OK to receive her flu vaccine and if she decides to receive both the flu and COVID vaccine, then to do so at least a couple weeks a part. Pt verbalized understanding.    Pt inquired about continuing walking as a form of exercise and this RN reassured her that it is OK to continue to walk. She also wanted to know if it would be OK to start her -statin as recommended by her PCP. This RN states that she should be OK to proceed with starting on her statin with her current medications prescribed by Dr. Al Pimple but if she would like she can call us back with the name of the medication and we can double check for her peace of mind. Pt verbalized understanding and stated that she would just wait until her next visit to discuss with [place].    This RN informed pt to give [place] a call if she has any other questions or concerns.

## 2023-01-20 NOTE — Telephone Encounter (Signed)
This RN called pt to gather information regarding PT request that pt states she discussed with her PCP. Pt states that she would like to receive PT in order to help with muscle aches. She states it is mostly in her arms and shoulders, but has now spread to her hip flexors. This RN states that she will relay the message to Dr. Al Pimple to see if it is OK to proceed with PT because due to her most recent DVT, Dr. Al Pimple does not advise engaging in too much manipulation of the legs. This RN states that she will give her a call back once she discusses with Dr. Al Pimple. Pt verbalized understanding.

## 2023-01-23 ENCOUNTER — Other Ambulatory Visit (HOSPITAL_COMMUNITY): Payer: Self-pay

## 2023-01-23 DIAGNOSIS — D2262 Melanocytic nevi of left upper limb, including shoulder: Secondary | ICD-10-CM | POA: Diagnosis not present

## 2023-01-23 DIAGNOSIS — L821 Other seborrheic keratosis: Secondary | ICD-10-CM | POA: Diagnosis not present

## 2023-01-23 DIAGNOSIS — L814 Other melanin hyperpigmentation: Secondary | ICD-10-CM | POA: Diagnosis not present

## 2023-01-23 DIAGNOSIS — D1801 Hemangioma of skin and subcutaneous tissue: Secondary | ICD-10-CM | POA: Diagnosis not present

## 2023-01-23 DIAGNOSIS — L570 Actinic keratosis: Secondary | ICD-10-CM | POA: Diagnosis not present

## 2023-01-25 DIAGNOSIS — R69 Illness, unspecified: Secondary | ICD-10-CM | POA: Diagnosis not present

## 2023-01-27 ENCOUNTER — Encounter: Payer: Self-pay | Admitting: Rehabilitative and Restorative Service Providers"

## 2023-01-27 ENCOUNTER — Ambulatory Visit: Payer: Medicare HMO | Attending: Adult Health | Admitting: Rehabilitative and Restorative Service Providers"

## 2023-01-27 ENCOUNTER — Other Ambulatory Visit: Payer: Self-pay

## 2023-01-27 DIAGNOSIS — Z483 Aftercare following surgery for neoplasm: Secondary | ICD-10-CM

## 2023-01-27 DIAGNOSIS — R293 Abnormal posture: Secondary | ICD-10-CM | POA: Diagnosis not present

## 2023-01-27 DIAGNOSIS — M2550 Pain in unspecified joint: Secondary | ICD-10-CM | POA: Insufficient documentation

## 2023-01-27 DIAGNOSIS — C50212 Malignant neoplasm of upper-inner quadrant of left female breast: Secondary | ICD-10-CM | POA: Diagnosis not present

## 2023-01-27 DIAGNOSIS — Z170 Estrogen receptor positive status [ER+]: Secondary | ICD-10-CM | POA: Diagnosis not present

## 2023-01-27 DIAGNOSIS — R252 Cramp and spasm: Secondary | ICD-10-CM

## 2023-01-27 DIAGNOSIS — M6281 Muscle weakness (generalized): Secondary | ICD-10-CM | POA: Diagnosis not present

## 2023-01-27 DIAGNOSIS — I890 Lymphedema, not elsewhere classified: Secondary | ICD-10-CM

## 2023-01-27 NOTE — Patient Instructions (Addendum)
     [facility] Welcome to [facility]! Here you will find all the information you will need regarding your pool therapy. If you have further questions at any time, please call our office at [phone]. After completing your initial evaluation in the [facility], you may be eligible to complete a portion of your therapy in the pool. A typical week of therapy will consist of 1-2 typical physical therapy visits at our [facility] location and an additional session of therapy in the pool located at the [facility] at [address]. [address], [place] [address]. The phone number at the pool site is [phone]. Please call this number if you are running late or need to cancel your appointment.  Aquatic therapy will be offered on Wednesday mornings and Friday afternoons. Each session will last approximately 45 minutes. All scheduling and payments for aquatic therapy sessions, including cancelations, will be done through our [facility] location.  To be eligible for aquatic therapy, these criteria must be met: You must be able to independently change in the locker room and get to the pool deck. A caregiver can come with you to help if needed. There are benches for a caregiver to sit on next to the pool. No one with an open wound is permitted in the pool.  Handicap parking is available in the front and there is a drop off option for even closer accessibility. Please arrive 15 minutes prior to your appointment to prepare for your pool session. You must sign in at the front desk upon your arrival. Please be sure to attend to any toileting needs prior to entering the pool. Locker rooms for changing are available.  There is direct access to the pool deck from the locker room. You can lock your belongings in a locker or bring them with you poolside. Your therapist will greet you on the pool deck. There may be other swimmers in the pool at the  same time but your session is one-on-one with the therapist.   

## 2023-01-27 NOTE — Therapy (Signed)
OUTPATIENT PHYSICAL THERAPY EVALUATION NOTE   Patient Name: Patricia Marshall MRN: [id number] DOB:01-11-1946, 77 y.o., female Today's Date: 01/27/2023  END OF SESSION:  PT End of Session - 01/27/23 1108     Visit Number 1    Date for PT Re-Evaluation 03/21/23    Authorization Type [employer] Medicare    Progress Note Due on Visit 10    PT Start Time 1100    PT Stop Time 1140    PT Time Calculation (min) 40 min    Activity Tolerance Patient tolerated treatment well    Behavior During Therapy WFL for tasks assessed/performed             Past Medical History:  Diagnosis Date   Arthritis    Breast cancer (HCC)    Chicken pox    High cholesterol    PONV (postoperative nausea and vomiting)    Vitamin D deficiency    Past Surgical History:  Procedure Laterality Date   APPENDECTOMY  1959   BACK SURGERY     1996   BREAST LUMPECTOMY WITH AXILLARY LYMPH NODE BIOPSY Left 11/15/2021   Procedure: LEFT BREAST LUMPECTOMY WITH AXILLARY SENTINEL LYMPH NODE BIOPSY;  Surgeon: Emelia Loron, MD;  Location: [facility];  Service: General;  Laterality: Left;   PORTACATH PLACEMENT Right 11/15/2021   Procedure: INSERTION PORT-A-CATH;  Surgeon: Emelia Loron, MD;  Location: [facility];  Service: General;  Laterality: Right;   TONSILLECTOMY AND ADENOIDECTOMY  1954   Patient Active Problem List   Diagnosis Date Noted   Acute deep vein thrombosis (DVT) of left lower extremity (HCC) 04/23/2022   Port-A-Cath in place 02/04/2022   Genetic testing 12/07/2021   Malignant neoplasm of upper-inner quadrant of left breast in female, estrogen receptor positive (HCC) 10/19/2021   Piriformis syndrome of left side 07/12/2015   Degenerative arthritis of left knee 07/12/2015   BEE STING REACTION, LOCAL 10/24/2006    PCP: [name], NP  REFERRING PROVIDER: [name], NP  REFERRING DIAG: M25.50 (ICD-10-CM) - Multiple joint pain  THERAPY DIAG:  Muscle  weakness (generalized) - Plan: PT plan of care cert/re-cert  Cramp and spasm - Plan: PT plan of care cert/re-cert  Aftercare following surgery for neoplasm - Plan: PT plan of care cert/re-cert  Lymphedema, not elsewhere classified - Plan: PT plan of care cert/re-cert  Abnormal posture - Plan: PT plan of care cert/re-cert  Malignant neoplasm of upper-inner quadrant of left breast in female, estrogen receptor positive (HCC) - Plan: PT plan of care cert/re-cert  Rationale for Evaluation and Treatment: Rehabilitation  ONSET DATE: Since 2023, but has been getting worse since September 2024  SUBJECTIVE:  SUBJECTIVE STATEMENT: Pt reports that her pain has been getting worse and not improving since her last chemotherapy treatment in September 2024.  Patient states that she has not been able to resume Silver Sneakers or Pickleball.  States that she used to do yoga and Zumba secondary to OA pain.  Hand dominance: Right  PERTINENT HISTORY: Recent DVTs (cleared by Dr Al Pimple that she can participate in PT) Patient was diagnosed on 09/20/2021 with left grade 2 invasive ductal carcinoma breast cancer. She underwent a left lumpectomy and sentinel node biopsy (2 negative nodes) on 11/15/2021. 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 Yes: NPRS scale: 0-6/10 Pain location: generally in upper body, especially left Pain description: intermittent, cramping Aggravating factors: unknown Relieving factors: sometimes movement  PRECAUTIONS: Other: L UE lymphedema risk, DVT (11/19/21)  RED FLAGS: None   WEIGHT BEARING RESTRICTIONS: No  FALLS:  Has patient fallen in last 6 months? No  LIVING ENVIRONMENT: Lives with: lives with  their spouse Lives in: House/apartment Stairs:  about 14 (has 2 sets)  steps; on left going up and External: 3 steps; none Has following equipment at home: None  OCCUPATION: Retired  PLOF: Independent and Leisure: Pickleball, Geophysicist/field seismologist, formerly enjoyed Psychologist, educational and yoga  PATIENT GOALS:To be able to get back to a more normal state with less pain.  NEXT MD VISIT:  SOZO screen on 02/23/1013, Dr Al Pimple on 02/25/23  OBJECTIVE:  Note: Objective measures were completed at Evaluation unless otherwise noted.  DIAGNOSTIC FINDINGS:  Bone Density on 05/20/2022: The BMD measured at Femur Total Right is 0.715 g/cm2 with a T-score of -2.3.  PATIENT SURVEYS:  Eval:  Quick Dash 27.27  COGNITION: Overall cognitive status: Within functional limits for tasks assessed     SENSATION: Denies numbness and tingling down arm  POSTURE: Slight forward rounded shoulder and forward head  UPPER EXTREMITY ROM:   01/27/2023: Right and Left shoulder flexion 140 degrees Right shoulder abduction 137 degrees Left shoulder abduction 125 degrees  UPPER EXTREMITY MMT:  01/27/2023: Right shoulder strength is WFL Left shoulder strength for flexion and abduction is WFL Left shoulder IR/ER is 4+/5 Grip strength:  right 52 lbs, left 28 lbs  LOWER EXTREMITY MMT:  01/27/2023: Right LE is WFL Left hip strength is grossly 4/5  FUNCTIONAL ASSESSMENT:  01/27/2023: 5 times sit to stand:  8.55 sec without UE support 3 minute walk test:  587 ft with RPE of 5/10 and left hip flexor pain of 6/10   TODAY'S TREATMENT:                                                                                                                                         DATE: 01/27/2023 Review of HEP and education on possible aquatic PT   PATIENT EDUCATION:  Education details: Issued HEP Person educated: Patient Education method: Explanation, Demonstration, and Handouts Education comprehension: verbalized understanding and  returned demonstration  HOME EXERCISE PROGRAM: Access Code: [email or web address] URL: [email or web address] Date: 01/27/2023 Prepared by: Clydie Braun Mckenna Boruff  Exercises - Seated Scapular Retraction  - 1 x daily - 7 x weekly - 2 sets - 10 reps - Standing Shoulder Abduction Slides at Wall  - 1 x daily - 7 x weekly - 2 sets - 10 reps - Wall Angels  - 1 x daily - 7 x weekly - 2 sets - 10 reps - Hip Flexor Stretch with Chair  - 1 x daily - 7 x weekly - 2 reps - 20 sec hold  ASSESSMENT:  CLINICAL IMPRESSION: Patient is a 77 y.o. female who was seen today for physical therapy evaluation and treatment for multiple joint pain.  Patient's PLOF is an active lifestyle and was participating in [employer] and playing Pickleball.  Patient reports that after she finished her last chemotherapy session in September 2024, she was hoping that she would start feeling better and be able to resume these activities, but states that she has still not been able to return to her prior active lifestyle. Patient with increased pain, muscle weakness, difficulty walking, decreased left shoulder ROM, and difficulty performing functional tasks.  Patient would benefit from skilled PT to address her functional impairments to allow her to return to her active lifestyle.    OBJECTIVE IMPAIRMENTS: decreased activity tolerance, decreased balance, difficulty walking, decreased ROM, decreased strength, impaired perceived functional ability, increased muscle spasms, impaired flexibility, postural dysfunction, and pain.   ACTIVITY LIMITATIONS: carrying, lifting, standing, squatting, sleeping, and stairs  PARTICIPATION LIMITATIONS: cleaning, driving, shopping, and community activity  PERSONAL FACTORS: Past/current experiences, Time since onset of injury/illness/exacerbation, and 3+ comorbidities: breast cancer s/p lumpectomy with 2 sentinel lymph nodes removed, DVTs, Reynauds  are also affecting patient's functional outcome.    REHAB POTENTIAL: Good  CLINICAL DECISION MAKING: Evolving/moderate complexity  EVALUATION COMPLEXITY: Moderate   GOALS: Goals reviewed with patient? Yes  SHORT TERM GOALS: Target date: 02/07/2023  Patient will be independent with initial HEP. Baseline: Goal status: INITIAL  2.  Patient will report at least a 25% improvement in symptoms since starting PT. Baseline:  Goal status: INITIAL   LONG TERM GOALS: Target date: 03/21/2023  Patient will be independent with advanced HEP. Baseline:  Goal status: INITIAL  2.  Patient will improve Quick DASH to no greater than 15 to demonstrate improvements functional tasks. Baseline: 27.27 Goal status: INITIAL  3.  Patient will increase her bilateral UE/LE strength to [facility] to allow her to perform all desired tasks, such as practicing her swing for Pickleball. Baseline:  Goal status: INITIAL  4.  Patient will report a decrease in pain to allow her to return to Silver Sneakers classes without increased pain. Baseline:  Goal status: INITIAL  5.  Patient will be able to ambulate for at least 20 minutes without increased pain to allow her to have community ambulation. Baseline: 3 minute walk test:  587 ft with RPE of 5/10 and left hip flexor pain of 6/10 Goal status: INITIAL    PLAN:  PT FREQUENCY: 1-2x/week  PT DURATION: 8 weeks  PLANNED INTERVENTIONS: 97164- PT Re-evaluation, 97110-Therapeutic exercises, 97530- Therapeutic activity, 97112- Neuromuscular re-education, 97535- Self Care, 13086- Manual therapy, [id number]- Gait training, [id number]- Aquatic Therapy, 97014- Electrical stimulation (unattended), [id number]- Electrical stimulation (manual), [id number]- Vasopneumatic device, [id number]- Ultrasound, [id number]- Traction (mechanical), [id number]- Ionotophoresis 4mg /ml Dexamethasone, Patient/Family education, Balance  training, Stair training, Taping, Dry Needling, Joint mobilization, Joint manipulation, Spinal manipulation, Spinal mobilization, Scar  mobilization, Cryotherapy, and Moist heat  PLAN FOR NEXT SESSION: Assess and progress HEP as indicated, strengthening, flexibility, core stability.   Reather Laurence, PT, DPT 01/27/23, 1:35 PM  [facility] [address] [place], [place] [address] Phone # [phone] Fax [phone]

## 2023-02-05 ENCOUNTER — Ambulatory Visit
Admission: RE | Admit: 2023-02-05 | Discharge: 2023-02-05 | Disposition: A | Discharge status: Home / Self Care | Payer: Medicare HMO | Source: Ambulatory Visit | Attending: Hematology and Oncology

## 2023-02-05 DIAGNOSIS — Z853 Personal history of malignant neoplasm of breast: Secondary | ICD-10-CM | POA: Diagnosis not present

## 2023-02-05 DIAGNOSIS — Z170 Estrogen receptor positive status [ER+]: Secondary | ICD-10-CM

## 2023-02-05 HISTORY — DX: Personal history of antineoplastic chemotherapy: Z92.21

## 2023-02-05 HISTORY — DX: Personal history of irradiation: Z92.3

## 2023-02-06 ENCOUNTER — Ambulatory Visit: Payer: Medicare HMO | Admitting: Rehabilitative and Restorative Service Providers"

## 2023-02-06 ENCOUNTER — Encounter: Payer: Self-pay | Admitting: Rehabilitative and Restorative Service Providers"

## 2023-02-06 DIAGNOSIS — R252 Cramp and spasm: Secondary | ICD-10-CM

## 2023-02-06 DIAGNOSIS — Z483 Aftercare following surgery for neoplasm: Secondary | ICD-10-CM | POA: Diagnosis not present

## 2023-02-06 DIAGNOSIS — M6281 Muscle weakness (generalized): Secondary | ICD-10-CM | POA: Diagnosis not present

## 2023-02-06 DIAGNOSIS — I890 Lymphedema, not elsewhere classified: Secondary | ICD-10-CM | POA: Diagnosis not present

## 2023-02-06 DIAGNOSIS — R293 Abnormal posture: Secondary | ICD-10-CM

## 2023-02-06 DIAGNOSIS — C50212 Malignant neoplasm of upper-inner quadrant of left female breast: Secondary | ICD-10-CM

## 2023-02-06 DIAGNOSIS — M2550 Pain in unspecified joint: Secondary | ICD-10-CM | POA: Diagnosis not present

## 2023-02-06 DIAGNOSIS — Z170 Estrogen receptor positive status [ER+]: Secondary | ICD-10-CM | POA: Diagnosis not present

## 2023-02-06 NOTE — Therapy (Signed)
OUTPATIENT PHYSICAL THERAPY TREATMENT NOTE   Patient Name: Patricia Marshall MRN: [id number] DOB:[birth year], 77 y.o., female Today's Date: 02/06/2023  END OF SESSION:  PT End of Session - 02/06/23 1009     Visit Number 2    Date for PT Re-Evaluation 03/21/23    Authorization Type Aetna Medicare    Progress Note Due on Visit 10    PT Start Time 1008    PT Stop Time 1050    PT Time Calculation (min) 42 min    Activity Tolerance Patient tolerated treatment well    Behavior During Therapy WFL for tasks assessed/performed             Past Medical History:  Diagnosis Date   Arthritis    Breast cancer (HCC)    Chicken pox    High cholesterol    Personal history of chemotherapy    Personal history of radiation therapy    PONV (postoperative nausea and vomiting)    Vitamin D deficiency    Past Surgical History:  Procedure Laterality Date   APPENDECTOMY  1959   BACK SURGERY     1996   BREAST LUMPECTOMY WITH AXILLARY LYMPH NODE BIOPSY Left 11/15/2021   Procedure: LEFT BREAST LUMPECTOMY WITH AXILLARY SENTINEL LYMPH NODE BIOPSY;  Surgeon: Emelia Loron, MD;  Location: [facility];  Service: General;  Laterality: Left;   PORTACATH PLACEMENT Right 11/15/2021   Procedure: INSERTION PORT-A-CATH;  Surgeon: Emelia Loron, MD;  Location: [facility];  Service: General;  Laterality: Right;   TONSILLECTOMY AND ADENOIDECTOMY  1954   Patient Active Problem List   Diagnosis Date Noted   Acute deep vein thrombosis (DVT) of left lower extremity (HCC) 04/23/2022   Port-A-Cath in place 02/04/2022   Genetic testing 12/07/2021   Malignant neoplasm of upper-inner quadrant of left breast in female, estrogen receptor positive (HCC) 10/19/2021   Piriformis syndrome of left side 07/12/2015   Degenerative arthritis of left knee 07/12/2015   BEE STING REACTION, LOCAL 10/24/2006    PCP: [name], NP  REFERRING PROVIDER: [name],  NP  REFERRING DIAG: M25.50 (ICD-10-CM) - Multiple joint pain  THERAPY DIAG:  Muscle weakness (generalized)  Cramp and spasm  Abnormal posture  Malignant neoplasm of upper-inner quadrant of left breast in female, estrogen receptor positive [facility])  Aftercare following surgery for neoplasm  Rationale for Evaluation and Treatment: Rehabilitation  ONSET DATE: Since 2023, but has been getting worse since September 2024  SUBJECTIVE:  SUBJECTIVE STATEMENT: Pt reports that some of the exercises have helped with loosening her up.  States that she is having some left knee pain this morning.  Hand dominance: Right  PERTINENT HISTORY: Recent DVTs (cleared by Dr Al Pimple that she can participate in PT) Patient was diagnosed on 09/20/2021 with left grade 2 invasive ductal carcinoma breast cancer. She underwent a left lumpectomy and sentinel node biopsy (2 negative nodes) on 11/15/2021. 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 Yes: NPRS scale: 5-6/10 Pain location: Left knee Pain description: intermittent, cramping Aggravating factors: unknown Relieving factors: sometimes movement  PRECAUTIONS: Other: L UE lymphedema risk, DVT (11/19/21)  RED FLAGS: None   WEIGHT BEARING RESTRICTIONS: No  FALLS:  Has patient fallen in last 6 months? No  LIVING ENVIRONMENT: Lives with: lives with their spouse Lives in: House/apartment Stairs:  about 14 (has 2 sets)  steps; on left going up and External: 3 steps; none Has following equipment at home: None  OCCUPATION: Retired  PLOF: Independent and Leisure: Pickleball, Geophysicist/field seismologist, formerly enjoyed Psychologist, educational and yoga  PATIENT GOALS:To be able to get back to a more normal  state with less pain.  NEXT MD VISIT:  SOZO screen on 02/23/1013, Dr Al Pimple on 02/25/23  OBJECTIVE:  Note: Objective measures were completed at Evaluation unless otherwise noted.  DIAGNOSTIC FINDINGS:  Bone Density on 05/20/2022: The BMD measured at Femur Total Right is 0.715 g/cm2 with a T-score of -2.3.  PATIENT SURVEYS:  Eval:  Quick Dash 27.27  COGNITION: Overall cognitive status: Within functional limits for tasks assessed     SENSATION: Denies numbness and tingling down arm  POSTURE: Slight forward rounded shoulder and forward head  UPPER EXTREMITY ROM:   01/27/2023: Right and Left shoulder flexion 140 degrees Right shoulder abduction 137 degrees Left shoulder abduction 125 degrees  UPPER EXTREMITY MMT:  01/27/2023: Right shoulder strength is WFL Left shoulder strength for flexion and abduction is WFL Left shoulder IR/ER is 4+/5 Grip strength:  right 52 lbs, left 28 lbs  LOWER EXTREMITY MMT:  01/27/2023: Right LE is WFL Left hip strength is grossly 4/5  FUNCTIONAL ASSESSMENT:  01/27/2023: 5 times sit to stand:  8.55 sec without UE support 3 minute walk test:  587 ft with RPE of 5/10 and left hip flexor pain of 6/10   TODAY'S TREATMENT:                                                                                                                                          DATE: 02/06/2023 Nustep level 2 x5 min with PT present to discuss status Shoulder pulleys for flexion and abduction x2 min each Standing wall angels x10 Standing facing the wall performing lower trap lift offs 2x10 Standing shoulder rows with red tband  2x10 Standing shoulder extension with red tband 2x10 Seated hamstring stretch 2x20 sec bilat Standing hip flexor stretch with foot on 2nd stair 2x20 sec bilat Supine shoulder flexion with 2# on cane 2x10 Supine shoulder chest press with 2# on cane 2x10 Right sidelying:  left shoulder abduction 2x10   DATE: 01/27/2023 Review of HEP  and education on possible aquatic PT   PATIENT EDUCATION: Education details: Issued HEP Person educated: Patient Education method: Explanation, Facilities manager, and Handouts Education comprehension: verbalized understanding and returned demonstration  HOME EXERCISE PROGRAM: Access Code: [email or web address] URL: [email or web address] Date: 02/06/2023 Prepared by: Reather Laurence  Exercises - Seated Scapular Retraction  - 1 x daily - 7 x weekly - 2 sets - 10 reps - Standing Shoulder Abduction Slides at Wall  - 1 x daily - 7 x weekly - 2 sets - 10 reps - Wall Angels  - 1 x daily - 7 x weekly - 2 sets - 10 reps - Hip Flexor Stretch with Chair  - 1 x daily - 7 x weekly - 2 reps - 20 sec hold - Standing Shoulder Row with Anchored Resistance  - 1 x daily - 7 x weekly - 2 sets - 10 reps - Shoulder extension with resistance - Neutral  - 1 x daily - 7 x weekly - 2 sets - 10 reps - Supine Shoulder Flexion Extension AAROM with Dowel  - 1 x daily - 7 x weekly - 2 sets - 10 reps - Sidelying Shoulder Abduction Palm Forward  - 1 x daily - 7 x weekly - 2 sets - 10 reps  ASSESSMENT:  CLINICAL IMPRESSION: Ms Fittipaldi presents to skilled PT for her initial visit following evaluation.  Pt reports that she can feel her shoulders loosening up with use of the shoulder pulleys.  Patient continues to have most difficulty with wall angels and difficulty with sidelying left shoulder abduction.  Patient reports feeling loosened up and feeling better by end of session.  Patient provided with updated HEP and red theraband for home use.  Patient with good response to PT session today with decreased pain reported by end of session.   OBJECTIVE IMPAIRMENTS: decreased activity tolerance, decreased balance, difficulty walking, decreased ROM, decreased strength, impaired perceived functional ability, increased muscle spasms, impaired flexibility, postural dysfunction, and pain.   ACTIVITY LIMITATIONS: carrying, lifting,  standing, squatting, sleeping, and stairs  PARTICIPATION LIMITATIONS: cleaning, driving, shopping, and community activity  PERSONAL FACTORS: Past/current experiences, Time since onset of injury/illness/exacerbation, and 3+ comorbidities: breast cancer s/p lumpectomy with 2 sentinel lymph nodes removed, DVTs, Reynauds  are also affecting patient's functional outcome.   REHAB POTENTIAL: Good  CLINICAL DECISION MAKING: Evolving/moderate complexity  EVALUATION COMPLEXITY: Moderate   GOALS: Goals reviewed with patient? Yes  SHORT TERM GOALS: Target date: 02/07/2023  Patient will be independent with initial HEP. Baseline: Goal status: Met on 02/06/2023  2.  Patient will report at least a 25% improvement in symptoms since starting PT. Baseline:  Goal status: Ongoing   LONG TERM GOALS: Target date: 03/21/2023  Patient will be independent with advanced HEP. Baseline:  Goal status: INITIAL  2.  Patient will improve Quick DASH to no greater than 15 to demonstrate improvements functional tasks. Baseline: 27.27 Goal status: INITIAL  3.  Patient will increase her bilateral UE/LE strength to [facility] to allow her to perform all desired tasks, such as practicing her swing for Pickleball. Baseline:  Goal status: INITIAL  4.  Patient will report a decrease in pain to  allow her to return to [employer] classes without increased pain. Baseline:  Goal status: INITIAL  5.  Patient will be able to ambulate for at least 20 minutes without increased pain to allow her to have community ambulation. Baseline: 3 minute walk test:  587 ft with RPE of 5/10 and left hip flexor pain of 6/10 Goal status: INITIAL    PLAN:  PT FREQUENCY: 1-2x/week  PT DURATION: 8 weeks  PLANNED INTERVENTIONS: 97164- PT Re-evaluation, 97110-Therapeutic exercises, 97530- Therapeutic activity, 97112- Neuromuscular re-education, 97535- Self Care, [address]- Manual therapy, [id number]- Gait training, [id number]- Aquatic Therapy,  97014- Electrical stimulation (unattended), [id number]- Electrical stimulation (manual), [id number]- Vasopneumatic device, [id number]- Ultrasound, [id number]- Traction (mechanical), [id number]- Ionotophoresis 4mg /ml Dexamethasone, Patient/Family education, Balance training, Stair training, Taping, Dry Needling, Joint mobilization, Joint manipulation, Spinal manipulation, Spinal mobilization, Scar mobilization, Cryotherapy, and Moist heat  PLAN FOR NEXT SESSION: Assess and progress HEP as indicated, strengthening, flexibility, core stability.   Reather Laurence, PT, DPT 02/06/23, 11:25 AM  [facility] [address], [address] [place], [place] [address] Phone # [phone] Fax [phone]

## 2023-02-17 ENCOUNTER — Encounter: Payer: Self-pay | Admitting: Rehabilitative and Restorative Service Providers"

## 2023-02-17 ENCOUNTER — Ambulatory Visit: Payer: Medicare HMO | Attending: Adult Health | Admitting: Rehabilitative and Restorative Service Providers"

## 2023-02-17 DIAGNOSIS — R252 Cramp and spasm: Secondary | ICD-10-CM | POA: Insufficient documentation

## 2023-02-17 DIAGNOSIS — R293 Abnormal posture: Secondary | ICD-10-CM | POA: Diagnosis not present

## 2023-02-17 DIAGNOSIS — M6281 Muscle weakness (generalized): Secondary | ICD-10-CM | POA: Insufficient documentation

## 2023-02-17 NOTE — Therapy (Signed)
OUTPATIENT PHYSICAL THERAPY TREATMENT NOTE   Patient Name: Patricia Marshall MRN: [id number] DOB:10-01-1945, 77 y.o., female Today's Date: 02/17/2023  END OF SESSION:  PT End of Session - 02/17/23 1059     Visit Number 3    Date for PT Re-Evaluation 03/21/23    Authorization Type [employer]    Progress Note Due on Visit 10    PT Start Time 1059    PT Stop Time 1140    PT Time Calculation (min) 41 min    Activity Tolerance Patient tolerated treatment well    Behavior During Therapy WFL for tasks assessed/performed             Past Medical History:  Diagnosis Date   Arthritis    Breast cancer (HCC)    Chicken pox    High cholesterol    Personal history of chemotherapy    Personal history of radiation therapy    PONV (postoperative nausea and vomiting)    Vitamin D deficiency    Past Surgical History:  Procedure Laterality Date   APPENDECTOMY  1959   BACK SURGERY     1996   BREAST LUMPECTOMY WITH AXILLARY LYMPH NODE BIOPSY Left 11/15/2021   Procedure: LEFT BREAST LUMPECTOMY WITH AXILLARY SENTINEL LYMPH NODE BIOPSY;  Surgeon: Emelia Loron, MD;  Location: [facility];  Service: General;  Laterality: Left;   PORTACATH PLACEMENT Right 11/15/2021   Procedure: INSERTION PORT-A-CATH;  Surgeon: Emelia Loron, MD;  Location: [facility];  Service: General;  Laterality: Right;   TONSILLECTOMY AND ADENOIDECTOMY  1954   Patient Active Problem List   Diagnosis Date Noted   Acute deep vein thrombosis (DVT) of left lower extremity (HCC) 04/23/2022   Port-A-Cath in place 02/04/2022   Genetic testing 12/07/2021   Malignant neoplasm of upper-inner quadrant of left breast in female, estrogen receptor positive (HCC) 10/19/2021   Piriformis syndrome of left side 07/12/2015   Degenerative arthritis of left knee 07/12/2015   BEE STING REACTION, LOCAL 10/24/2006    PCP: [name], NP  REFERRING PROVIDER: [name],  NP  REFERRING DIAG: M25.50 (ICD-10-CM) - Multiple joint pain  THERAPY DIAG:  Muscle weakness (generalized)  Cramp and spasm  Abnormal posture  Rationale for Evaluation and Treatment: Rehabilitation  ONSET DATE: Since 2023, but has been getting worse since September 2024  SUBJECTIVE:                                                                                                                                                                                      SUBJECTIVE STATEMENT: Pt reports that overall, she is feeling at least 40% better since  starting skilled PT.  Pt states that she was able to do some landscaping on her deck, including leaf blowing.  Patient reports that she had some hip soreness after, but the stretches helped after.  Hand dominance: Right  PERTINENT HISTORY: Recent DVTs (cleared by Dr Al Pimple that she can participate in PT) Patient was diagnosed on 09/20/2021 with left grade 2 invasive ductal carcinoma breast cancer. She underwent a left lumpectomy and sentinel node biopsy (2 negative nodes) on 11/15/2021. 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 Yes: NPRS scale: 5-6/10 Pain location: Left knee Pain description: intermittent, cramping Aggravating factors: unknown Relieving factors: sometimes movement  PRECAUTIONS: Other: L UE lymphedema risk, DVT (11/19/21)  RED FLAGS: None   WEIGHT BEARING RESTRICTIONS: No  FALLS:  Has patient fallen in last 6 months? No  LIVING ENVIRONMENT: Lives with: lives with their spouse Lives in: House/apartment Stairs:  about 14 (has 2 sets)  steps; on left going up and External: 3 steps; none Has following equipment at home: None  OCCUPATION: Retired  PLOF: Independent and Leisure: Pickleball, Geophysicist/field seismologist, formerly enjoyed Psychologist, educational and yoga  PATIENT GOALS:To be able to get back to a more normal state with less  pain.  NEXT MD VISIT:  SOZO screen on 02/23/1013, Dr Al Pimple on 02/25/23  OBJECTIVE:  Note: Objective measures were completed at Evaluation unless otherwise noted.  DIAGNOSTIC FINDINGS:  Bone Density on 05/20/2022: The BMD measured at Femur Total Right is 0.715 g/cm2 with a T-score of -2.3.  PATIENT SURVEYS:  Eval:  Quick Dash 27.27  COGNITION: Overall cognitive status: Within functional limits for tasks assessed     SENSATION: Denies numbness and tingling down arm  POSTURE: Slight forward rounded shoulder and forward head  UPPER EXTREMITY ROM:   01/27/2023: Right and Left shoulder flexion 140 degrees Right shoulder abduction 137 degrees Left shoulder abduction 125 degrees  UPPER EXTREMITY MMT:  01/27/2023: Right shoulder strength is WFL Left shoulder strength for flexion and abduction is WFL Left shoulder IR/ER is 4+/5 Grip strength:  right 52 lbs, left 28 lbs  LOWER EXTREMITY MMT:  01/27/2023: Right LE is WFL Left hip strength is grossly 4/5  FUNCTIONAL ASSESSMENT:  01/27/2023: 5 times sit to stand:  8.55 sec without UE support 3 minute walk test:  587 ft with RPE of 5/10 and left hip flexor pain of 6/10   TODAY'S TREATMENT:                                                                                                                                          DATE: 02/17/2023 Nustep level 2 x5 min with PT present to discuss status Shoulder pulleys for flexion and abduction x2 min each Standing wall angels x10 Standing facing the wall performing lower trap lift  offs 2x10 Seated hamstring stretch 2x20 sec bilat Standing hip flexor stretch with foot on 2nd stair 2x20 sec bilat Supine shoulder flexion with 2# on cane 2x10 Supine shoulder chest press with 2# on cane 2x10 Right sidelying:  left shoulder abduction 2x10   DATE: 02/06/2023 Nustep level 2 x5 min with PT present to discuss status Shoulder pulleys for flexion and abduction x2 min each Standing wall  angels x10 Standing facing the wall performing lower trap lift offs 2x10 Standing shoulder rows with red tband 2x10 Standing shoulder extension with red tband 2x10 Seated hamstring stretch 2x20 sec bilat Standing hip flexor stretch with foot on 2nd stair 2x20 sec bilat Supine shoulder flexion with 2# on cane 2x10 Supine shoulder chest press with 2# on cane 2x10 Right sidelying:  left shoulder abduction 2x10   DATE: 01/27/2023 Review of HEP and education on possible aquatic PT   PATIENT EDUCATION: Education details: Issued HEP Person educated: Patient Education method: Programmer, multimedia, Facilities manager, and Handouts Education comprehension: verbalized understanding and returned demonstration  HOME EXERCISE PROGRAM: Access Code: [email or web address] URL: [email or web address] Date: 02/06/2023 Prepared by: Reather Laurence  Exercises - Seated Scapular Retraction  - 1 x daily - 7 x weekly - 2 sets - 10 reps - Standing Shoulder Abduction Slides at Wall  - 1 x daily - 7 x weekly - 2 sets - 10 reps - Wall Angels  - 1 x daily - 7 x weekly - 2 sets - 10 reps - Hip Flexor Stretch with Chair  - 1 x daily - 7 x weekly - 2 reps - 20 sec hold - Standing Shoulder Row with Anchored Resistance  - 1 x daily - 7 x weekly - 2 sets - 10 reps - Shoulder extension with resistance - Neutral  - 1 x daily - 7 x weekly - 2 sets - 10 reps - Supine Shoulder Flexion Extension AAROM with Dowel  - 1 x daily - 7 x weekly - 2 sets - 10 reps - Sidelying Shoulder Abduction Palm Forward  - 1 x daily - 7 x weekly - 2 sets - 10 reps  ASSESSMENT:  CLINICAL IMPRESSION: Ms Pliler presents to skilled PT reporting that she is feeling a lot better with her exercises and has now met all short-term goals.  Patient reports continued compliance with HEP.  Patient required less cuing today during exercises and demonstrates overall improved posture.  Patient continues with most difficulty with Wall Angel exercise.  Still unable to  perform hamstring stretch in standing secondary to strain on back, so continued to perform in seated position.  Patient continues to require skilled PT to progress towards goal related activities.   OBJECTIVE IMPAIRMENTS: decreased activity tolerance, decreased balance, difficulty walking, decreased ROM, decreased strength, impaired perceived functional ability, increased muscle spasms, impaired flexibility, postural dysfunction, and pain.   ACTIVITY LIMITATIONS: carrying, lifting, standing, squatting, sleeping, and stairs  PARTICIPATION LIMITATIONS: cleaning, driving, shopping, and community activity  PERSONAL FACTORS: Past/current experiences, Time since onset of injury/illness/exacerbation, and 3+ comorbidities: breast cancer s/p lumpectomy with 2 sentinel lymph nodes removed, DVTs, Reynauds  are also affecting patient's functional outcome.   REHAB POTENTIAL: Good  CLINICAL DECISION MAKING: Evolving/moderate complexity  EVALUATION COMPLEXITY: Moderate   GOALS: Goals reviewed with patient? Yes  SHORT TERM GOALS: Target date: 02/07/2023  Patient will be independent with initial HEP. Baseline: Goal status: Met on 02/06/2023  2.  Patient will report at least a 25% improvement in symptoms since starting PT. Baseline:  Goal status:  Met on 02/17/2023   LONG TERM GOALS: Target date: 03/21/2023  Patient will be independent with advanced HEP. Baseline:  Goal status: Ongoing  2.  Patient will improve Quick DASH to no greater than 15 to demonstrate improvements functional tasks. Baseline: 27.27 Goal status: INITIAL  3.  Patient will increase her bilateral UE/LE strength to [facility] to allow her to perform all desired tasks, such as practicing her swing for Pickleball. Baseline:  Goal status: Ongoing  4.  Patient will report a decrease in pain to allow her to return to [employer] classes without increased pain. Baseline:  Goal status: INITIAL  5.  Patient will be able to ambulate  for at least 20 minutes without increased pain to allow her to have community ambulation. Baseline: 3 minute walk test:  587 ft with RPE of 5/10 and left hip flexor pain of 6/10 Goal status: INITIAL    PLAN:  PT FREQUENCY: 1-2x/week  PT DURATION: 8 weeks  PLANNED INTERVENTIONS: 97164- PT Re-evaluation, 97110-Therapeutic exercises, 97530- Therapeutic activity, 97112- Neuromuscular re-education, 97535- Self Care, 10960- Manual therapy, [id number]- Gait training, [id number]- Aquatic Therapy, 97014- Electrical stimulation (unattended), [id number]- Electrical stimulation (manual), [id number]- Vasopneumatic device, [id number]- Ultrasound, [id number]- Traction (mechanical), [id number]- Ionotophoresis 4mg /ml Dexamethasone, Patient/Family education, Balance training, Stair training, Taping, Dry Needling, Joint mobilization, Joint manipulation, Spinal manipulation, Spinal mobilization, Scar mobilization, Cryotherapy, and Moist heat  PLAN FOR NEXT SESSION: Assess and progress HEP as indicated, strengthening, flexibility, core stability.   Reather Laurence, PT, DPT 02/17/23, 12:00 PM  [facility] [address], [address] [place], [place] [address] Phone # [phone] Fax [phone]

## 2023-02-20 ENCOUNTER — Encounter: Payer: Self-pay | Admitting: Adult Health

## 2023-02-23 NOTE — Therapy (Signed)
OUTPATIENT PHYSICAL THERAPY TREATMENT NOTE   Patient Name: Patricia Marshall MRN: [id number] DOB:09-Mar-1946, 77 y.o., female Today's Date: 02/24/2023  END OF SESSION:  PT End of Session - 02/24/23 1446     Visit Number 4    Date for PT Re-Evaluation 03/21/23    Authorization Type [employer] Medicare    Progress Note Due on Visit 10    PT Start Time 1447    PT Stop Time 1530    PT Time Calculation (min) 43 min    Activity Tolerance Patient tolerated treatment well    Behavior During Therapy WFL for tasks assessed/performed              Past Medical History:  Diagnosis Date   Arthritis    Breast cancer (HCC)    Chicken pox    High cholesterol    Personal history of chemotherapy    Personal history of radiation therapy    PONV (postoperative nausea and vomiting)    Vitamin D deficiency    Past Surgical History:  Procedure Laterality Date   APPENDECTOMY  1959   BACK SURGERY     1996   BREAST LUMPECTOMY WITH AXILLARY LYMPH NODE BIOPSY Left 11/15/2021   Procedure: LEFT BREAST LUMPECTOMY WITH AXILLARY SENTINEL LYMPH NODE BIOPSY;  Surgeon: Emelia Loron, MD;  Location: [facility];  Service: General;  Laterality: Left;   PORTACATH PLACEMENT Right 11/15/2021   Procedure: INSERTION PORT-A-CATH;  Surgeon: Emelia Loron, MD;  Location: [facility];  Service: General;  Laterality: Right;   TONSILLECTOMY AND ADENOIDECTOMY  1954   Patient Active Problem List   Diagnosis Date Noted   Acute deep vein thrombosis (DVT) of left lower extremity (HCC) 04/23/2022   Port-A-Cath in place 02/04/2022   Genetic testing 12/07/2021   Malignant neoplasm of upper-inner quadrant of left breast in female, estrogen receptor positive (HCC) 10/19/2021   Piriformis syndrome of left side 07/12/2015   Degenerative arthritis of left knee 07/12/2015   BEE STING REACTION, LOCAL 10/24/2006    PCP: [name], NP  REFERRING PROVIDER: [name],  NP  REFERRING DIAG: M25.50 (ICD-10-CM) - Multiple joint pain  THERAPY DIAG:  Muscle weakness (generalized)  Cramp and spasm  Abnormal posture  Rationale for Evaluation and Treatment: Rehabilitation  ONSET DATE: Since 2023, but has been getting worse since September 2024  SUBJECTIVE:                                                                                                                                                                                      SUBJECTIVE STATEMENT: Sore in by lower body after working in the yard yesterday.  Hand dominance: Right  PERTINENT HISTORY: Recent DVTs (cleared by Dr Al Pimple that she can participate in PT) Patient was diagnosed on 09/20/2021 with left grade 2 invasive ductal carcinoma breast cancer. She underwent a left lumpectomy and sentinel node biopsy (2 negative nodes) on 11/15/2021. 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 Yes: NPRS scale: 5-6/10 Pain location: Left knee Pain description: intermittent, cramping Aggravating factors: unknown Relieving factors: sometimes movement  PRECAUTIONS: Other: L UE lymphedema risk, DVT (11/19/21)  RED FLAGS: None   WEIGHT BEARING RESTRICTIONS: No  FALLS:  Has patient fallen in last 6 months? No  LIVING ENVIRONMENT: Lives with: lives with their spouse Lives in: House/apartment Stairs:  about 14 (has 2 sets)  steps; on left going up and External: 3 steps; none Has following equipment at home: None  OCCUPATION: Retired  PLOF: Independent and Leisure: Pickleball, Geophysicist/field seismologist, formerly enjoyed Psychologist, educational and yoga  PATIENT GOALS:To be able to get back to a more normal state with less pain.  NEXT MD VISIT:  SOZO screen on 02/23/1013, Dr Al Pimple on 02/25/23  OBJECTIVE:  Note: Objective measures were completed at Evaluation unless otherwise noted.  DIAGNOSTIC FINDINGS:  Bone Density on  05/20/2022: The BMD measured at Femur Total Right is 0.715 g/cm2 with a T-score of -2.3.  PATIENT SURVEYS:  Eval:  Quick Dash 27.27  COGNITION: Overall cognitive status: Within functional limits for tasks assessed     SENSATION: Denies numbness and tingling down arm  POSTURE: Slight forward rounded shoulder and forward head  UPPER EXTREMITY ROM:   01/27/2023: Right and Left shoulder flexion 140 degrees Right shoulder abduction 137 degrees Left shoulder abduction 125 degrees  UPPER EXTREMITY MMT:  01/27/2023: Right shoulder strength is WFL Left shoulder strength for flexion and abduction is WFL Left shoulder IR/ER is 4+/5 Grip strength:  right 52 lbs, left 28 lbs  LOWER EXTREMITY MMT:  01/27/2023: Right LE is WFL Left hip strength is grossly 4/5  FUNCTIONAL ASSESSMENT:  01/27/2023: 5 times sit to stand:  8.55 sec without UE support 3 minute walk test:  587 ft with RPE of 5/10 and left hip flexor pain of 6/10   TODAY'S TREATMENT:                                                                                                                                          DATE: 02/24/2023 Nustep level 5 x5 min with PT present to discuss status Standing hip flexor stretch with foot on 2nd stair 2x20 sec bilat Standing hip ADDuctor stretch at counter 2x 20 sec B L psoas release in hooklying Shoulder pulleys for flexion and abduction x2 min each Standing wall angels x10 Standing facing the wall performing lower trap lift offs 2x10 Seated hamstring stretch 2x20 sec bilat Supine  shoulder flexion with 2# on cane 2x10 Supine shoulder chest press with 2# on cane 2x10 (easy) Right sidelying:  left shoulder abduction 2x10  DATE: 02/17/2023 Nustep level 2 x5 min with PT present to discuss status Shoulder pulleys for flexion and abduction x2 min each Standing wall angels x10 Standing facing the wall performing lower trap lift offs 2x10 Seated hamstring stretch 2x20 sec  bilat Standing hip flexor stretch with foot on 2nd stair 2x20 sec bilat Supine shoulder flexion with 2# on cane 2x10 Supine shoulder chest press with 2# on cane 2x10 Right sidelying:  left shoulder abduction 2x10   DATE: 02/06/2023 Nustep level 2 x5 min with PT present to discuss status Shoulder pulleys for flexion and abduction x2 min each Standing wall angels x10 Standing facing the wall performing lower trap lift offs 2x10 Standing shoulder rows with red tband 2x10 Standing shoulder extension with red tband 2x10 Seated hamstring stretch 2x20 sec bilat Standing hip flexor stretch with foot on 2nd stair 2x20 sec bilat Supine shoulder flexion with 2# on cane 2x10 Supine shoulder chest press with 2# on cane 2x10 Right sidelying:  left shoulder abduction 2x10   DATE: 01/27/2023 Review of HEP and education on possible aquatic PT   PATIENT EDUCATION: Education details: Issued HEP Person educated: Patient Education method: Programmer, multimedia, Facilities manager, and Handouts Education comprehension: verbalized understanding and returned demonstration  HOME EXERCISE PROGRAM: Access Code: [email or web address] URL: [email or web address] Date: 02/24/2023 Prepared by: Raynelle Fanning  Exercises - Seated Scapular Retraction  - 1 x daily - 7 x weekly - 2 sets - 10 reps - Standing Shoulder Abduction Slides at Wall  - 1 x daily - 7 x weekly - 2 sets - 10 reps - Wall Angels  - 1 x daily - 7 x weekly - 2 sets - 10 reps - Hip Flexor Stretch with Chair  - 1 x daily - 7 x weekly - 2 reps - 20 sec hold - Standing Shoulder Row with Anchored Resistance  - 1 x daily - 7 x weekly - 2 sets - 10 reps - Shoulder extension with resistance - Neutral  - 1 x daily - 7 x weekly - 2 sets - 10 reps - Supine Shoulder Flexion Extension AAROM with Dowel  - 1 x daily - 7 x weekly - 2 sets - 10 reps - Sidelying Shoulder Abduction Palm Forward  - 1 x daily - 7 x weekly - 2 sets - 10 reps - Side Lunge Adductor Stretch  - 2 x daily  - 7 x weekly - 1 sets - 3 reps - 30-60 sec hold - Psoas Mobilization with Small Ball  - 1 x daily - 7 x weekly - 1 sets - 1 reps - 30-60 sec hold  ASSESSMENT:  CLINICAL IMPRESSION: Charnice presents with a tight left psoas today causing her to limp a little. She states it's worse after getting out of the car. She responded well to manual psoas release and was able to normalize her gait after. PT also demonstrated self release with ball and added a B hip adductor stretch as she was very tight there. She is improving with shoulder flexibility. Wall lifts off are still challenging for her. Ahniyah continues to demonstrate potential for improvement and would benefit from continued skilled therapy to address impairments.     OBJECTIVE IMPAIRMENTS: decreased activity tolerance, decreased balance, difficulty walking, decreased ROM, decreased strength, impaired perceived functional ability, increased muscle spasms, impaired flexibility, postural dysfunction, and pain.   ACTIVITY LIMITATIONS: carrying,  lifting, standing, squatting, sleeping, and stairs  PARTICIPATION LIMITATIONS: cleaning, driving, shopping, and community activity  PERSONAL FACTORS: Past/current experiences, Time since onset of injury/illness/exacerbation, and 3+ comorbidities: breast cancer s/p lumpectomy with 2 sentinel lymph nodes removed, DVTs, Reynauds  are also affecting patient's functional outcome.   REHAB POTENTIAL: Good  CLINICAL DECISION MAKING: Evolving/moderate complexity  EVALUATION COMPLEXITY: Moderate   GOALS: Goals reviewed with patient? Yes  SHORT TERM GOALS: Target date: 02/07/2023  Patient will be independent with initial HEP. Baseline: Goal status: Met on 02/06/2023  2.  Patient will report at least a 25% improvement in symptoms since starting PT. Baseline:  Goal status: Met on 02/17/2023   LONG TERM GOALS: Target date: 03/21/2023  Patient will be independent with advanced HEP. Baseline:  Goal status:  Ongoing  2.  Patient will improve Quick DASH to no greater than 15 to demonstrate improvements functional tasks. Baseline: 27.27 Goal status: INITIAL  3.  Patient will increase her bilateral UE/LE strength to [facility] to allow her to perform all desired tasks, such as practicing her swing for Pickleball. Baseline:  Goal status: Ongoing  4.  Patient will report a decrease in pain to allow her to return to [employer] classes without increased pain. Baseline:  Goal status: INITIAL  5.  Patient will be able to ambulate for at least 20 minutes without increased pain to allow her to have community ambulation. Baseline: 3 minute walk test:  587 ft with RPE of 5/10 and left hip flexor pain of 6/10 Goal status: INITIAL    PLAN:  PT FREQUENCY: 1-2x/week  PT DURATION: 8 weeks  PLANNED INTERVENTIONS: 97164- PT Re-evaluation, 97110-Therapeutic exercises, 97530- Therapeutic activity, 97112- Neuromuscular re-education, 97535- Self Care, 10272- Manual therapy, [id number]- Gait training, [id number]- Aquatic Therapy, 97014- Electrical stimulation (unattended), [id number]- Electrical stimulation (manual), [id number]- Vasopneumatic device, [id number]- Ultrasound, [id number]- Traction (mechanical), [id number]- Ionotophoresis 4mg /ml Dexamethasone, Patient/Family education, Balance training, Stair training, Taping, Dry Needling, Joint mobilization, Joint manipulation, Spinal manipulation, Spinal mobilization, Scar mobilization, Cryotherapy, and Moist heat  PLAN FOR NEXT SESSION: Assess and progress HEP as indicated, strengthening, flexibility, core stability.   Solon Palm, PT  02/24/23, 3:36 PM  [facility] [address] [place], [place] [address] Phone # [phone] Fax [phone]

## 2023-02-24 ENCOUNTER — Ambulatory Visit: Payer: Medicare HMO | Admitting: Physical Therapy

## 2023-02-24 ENCOUNTER — Encounter: Payer: Self-pay | Admitting: Physical Therapy

## 2023-02-24 ENCOUNTER — Ambulatory Visit: Payer: Medicare HMO | Attending: General Surgery

## 2023-02-24 VITALS — Wt 124.0 lb

## 2023-02-24 DIAGNOSIS — Z483 Aftercare following surgery for neoplasm: Secondary | ICD-10-CM | POA: Insufficient documentation

## 2023-02-24 DIAGNOSIS — M6281 Muscle weakness (generalized): Secondary | ICD-10-CM

## 2023-02-24 DIAGNOSIS — R293 Abnormal posture: Secondary | ICD-10-CM | POA: Diagnosis not present

## 2023-02-24 DIAGNOSIS — R252 Cramp and spasm: Secondary | ICD-10-CM

## 2023-02-24 NOTE — Therapy (Signed)
OUTPATIENT PHYSICAL THERAPY SOZO SCREENING NOTE   Patient Name: Patricia Marshall MRN: [id number] DOB:[birth year], 77 y.o., female Today's Date: 02/24/2023  PCP: [name], NP REFERRING PROVIDER: Emelia Loron, MD   PT End of Session - 02/24/23 1551     Visit Number 4   # unchanged due to screen only   PT Start Time 1547    PT Stop Time 1553    PT Time Calculation (min) 6 min    Activity Tolerance Patient tolerated treatment well    Behavior During Therapy WFL for tasks assessed/performed             Past Medical History:  Diagnosis Date   Arthritis    Breast cancer (HCC)    Chicken pox    High cholesterol    Personal history of chemotherapy    Personal history of radiation therapy    PONV (postoperative nausea and vomiting)    Vitamin D deficiency    Past Surgical History:  Procedure Laterality Date   APPENDECTOMY  1959   BACK SURGERY     1996   BREAST LUMPECTOMY WITH AXILLARY LYMPH NODE BIOPSY Left 11/15/2021   Procedure: LEFT BREAST LUMPECTOMY WITH AXILLARY SENTINEL LYMPH NODE BIOPSY;  Surgeon: Emelia Loron, MD;  Location: [facility];  Service: General;  Laterality: Left;   PORTACATH PLACEMENT Right 11/15/2021   Procedure: INSERTION PORT-A-CATH;  Surgeon: Emelia Loron, MD;  Location: [facility];  Service: General;  Laterality: Right;   TONSILLECTOMY AND ADENOIDECTOMY  1954   Patient Active Problem List   Diagnosis Date Noted   Acute deep vein thrombosis (DVT) of left lower extremity (HCC) 04/23/2022   Port-A-Cath in place 02/04/2022   Genetic testing 12/07/2021   Malignant neoplasm of upper-inner quadrant of left breast in female, estrogen receptor positive (HCC) 10/19/2021   Piriformis syndrome of left side 07/12/2015   Degenerative arthritis of left knee 07/12/2015   BEE STING REACTION, LOCAL 10/24/2006    REFERRING DIAG: left breast cancer at risk for lymphedema  THERAPY DIAG: Aftercare following  surgery for neoplasm  PERTINENT HISTORY: Recent DVTs (cleared by Dr Al Pimple that she can participate in PT) Patient was diagnosed on 09/20/2021 with left grade 2 invasive ductal carcinoma breast cancer. She underwent a left lumpectomy and sentinel node biopsy (2 negative nodes) on 11/15/2021. 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   PAIN:  Are you having pain? No  SOZO SCREENING: Patient was assessed today using the SOZO machine to determine the lymphedema index score. This was compared to her baseline score. It was determined that she is within the recommended range when compared to her baseline and no further action is needed at this time. She will continue SOZO screenings. These are done every 3 months for 2 years post operatively followed by every 6 months for 2 years, and then annually.   L-DEX FLOWSHEETS - 02/24/23 1500       L-DEX LYMPHEDEMA SCREENING   Measurement Type Unilateral    L-DEX MEASUREMENT EXTREMITY Upper Extremity    POSITION  Standing    DOMINANT SIDE Right    At Risk Side Left    BASELINE SCORE (UNILATERAL) -3    L-DEX SCORE (UNILATERAL) -2.4    VALUE CHANGE (UNILAT) 0.6  Hermenia Bers, PTA 02/24/2023, 3:53 PM

## 2023-02-25 ENCOUNTER — Encounter: Payer: Medicare HMO | Admitting: Adult Health

## 2023-02-25 ENCOUNTER — Inpatient Hospital Stay: Payer: Medicare HMO

## 2023-02-25 ENCOUNTER — Inpatient Hospital Stay: Payer: Medicare HMO | Attending: Hematology and Oncology | Admitting: Hematology and Oncology

## 2023-02-25 VITALS — BP 129/64 | HR 61 | Temp 98.3°F | Resp 16 | Wt 125.4 lb

## 2023-02-25 DIAGNOSIS — I824Z1 Acute embolism and thrombosis of unspecified deep veins of right distal lower extremity: Secondary | ICD-10-CM

## 2023-02-25 DIAGNOSIS — Z79811 Long term (current) use of aromatase inhibitors: Secondary | ICD-10-CM | POA: Insufficient documentation

## 2023-02-25 DIAGNOSIS — Z170 Estrogen receptor positive status [ER+]: Secondary | ICD-10-CM | POA: Insufficient documentation

## 2023-02-25 DIAGNOSIS — C50212 Malignant neoplasm of upper-inner quadrant of left female breast: Secondary | ICD-10-CM | POA: Insufficient documentation

## 2023-02-25 DIAGNOSIS — Z79899 Other long term (current) drug therapy: Secondary | ICD-10-CM | POA: Diagnosis not present

## 2023-02-25 DIAGNOSIS — Z86718 Personal history of other venous thrombosis and embolism: Secondary | ICD-10-CM | POA: Insufficient documentation

## 2023-02-25 DIAGNOSIS — Z9221 Personal history of antineoplastic chemotherapy: Secondary | ICD-10-CM | POA: Diagnosis not present

## 2023-02-25 DIAGNOSIS — Z923 Personal history of irradiation: Secondary | ICD-10-CM | POA: Insufficient documentation

## 2023-02-25 LAB — CBC WITH DIFFERENTIAL/PLATELET
Abs Immature Granulocytes: 0 10*3/uL (ref 0.00–0.07)
Basophils Absolute: 0.1 10*3/uL (ref 0.0–0.1)
Basophils Relative: 1 %
Eosinophils Absolute: 0.1 10*3/uL (ref 0.0–0.5)
Eosinophils Relative: 2 %
HCT: 39.8 % (ref 36.0–46.0)
Hemoglobin: 13.2 g/dL (ref 12.0–15.0)
Immature Granulocytes: 0 %
Lymphocytes Relative: 29 %
Lymphs Abs: 1.4 10*3/uL (ref 0.7–4.0)
MCH: 29.3 pg (ref 26.0–34.0)
MCHC: 33.2 g/dL (ref 30.0–36.0)
MCV: 88.4 fL (ref 80.0–100.0)
Monocytes Absolute: 0.5 10*3/uL (ref 0.1–1.0)
Monocytes Relative: 10 %
Neutro Abs: 3 10*3/uL (ref 1.7–7.7)
Neutrophils Relative %: 58 %
Platelets: 156 10*3/uL (ref 150–400)
RBC: 4.5 MIL/uL (ref 3.87–5.11)
RDW: 12.6 % (ref 11.5–15.5)
WBC: 5 10*3/uL (ref 4.0–10.5)
nRBC: 0 % (ref 0.0–0.2)

## 2023-02-25 LAB — TSH: TSH: 2.043 u[IU]/mL (ref 0.350–4.500)

## 2023-02-25 NOTE — Progress Notes (Signed)
[facility] Cancer Follow up:    Patricia Frees, NP [address] [place] [address]   DIAGNOSIS:  Cancer Staging  Malignant neoplasm of upper-inner quadrant of left breast in female, estrogen receptor positive (HCC) Staging form: Breast, AJCC 8th Edition - Pathologic: Stage IIA (pT2, pN0, cM0, G3, ER+, PR-, HER2+) - Signed by Rachel Moulds, MD on 11/26/2021 Histologic grading system: 3 grade system   SUMMARY OF ONCOLOGIC HISTORY: Oncology History  Malignant neoplasm of upper-inner quadrant of left breast in female, estrogen receptor positive (HCC)  09/20/2021 Mammogram   Screening mammogram showed extremely dense breasts and a possible mass in the left breast warranting further evaluation.  Breast density category is D Diagnostic mammogram of the breast showed indeterminate mass in the 11:00 location of the left breast.  Indeterminate mass in the lower anterior left axilla.   10/04/2021 Pathology Results   Pathology showed grade 2 invasive ductal carcinoma, left axillary soft tissue needle core biopsy showed benign breast tissue with patchy changes prognostics from the breast tumor showed ER 90% positive strong staining PR 0%, negative, Ki-67 of 30% and HER2 positive for IHC 3+   10/19/2021 Initial Diagnosis   Malignant neoplasm of upper-inner quadrant of left breast in female, estrogen receptor positive (HCC)   10/23/2021 Cancer Staging   Staging form: Breast, AJCC 8th Edition - Pathologic: Stage IIA (pT2, pN0, cM0, G3, ER+, PR-, HER2+) - Signed by Rachel Moulds, MD on 11/26/2021 Histologic grading system: 3 grade system   11/10/2021 Genetic Testing   Negative hereditary cancer genetic testing: no pathogenic variants detected in [facility] CancerNext-Expanded +RNA Panel.  Report date is November 10, 2021.   The CancerNext-Expanded gene panel offered by [facility] and includes sequencing, rearrangement, and RNA analysis for the following 77 genes: AIP, ALK, APC,  ATM, AXIN2, BAP1, BARD1, BLM, BMPR1A, BRCA1, BRCA2, BRIP1, CDC73, CDH1, CDK4, CDKN1B, CDKN2A, CHEK2, CTNNA1, DICER1, FANCC, FH, FLCN, GALNT12, KIF1B, LZTR1, MAX, MEN1, MET, MLH1, MSH2, MSH3, MSH6, MUTYH, NBN, NF1, NF2, NTHL1, PALB2, PHOX2B, PMS2, POT1, PRKAR1A, PTCH1, PTEN, RAD51C, RAD51D, RB1, RECQL, RET, SDHA, SDHAF2, SDHB, SDHC, SDHD, SMAD4, SMARCA4, SMARCB1, SMARCE1, STK11, SUFU, TMEM127, TP53, TSC1, TSC2, VHL and XRCC2 (sequencing and deletion/duplication); EGFR, EGLN1, HOXB13, KIT, MITF, PDGFRA, POLD1, and POLE (sequencing only); EPCAM and GREM1 (deletion/duplication only).    11/15/2021 Surgery   Left breast lumpectomy: 2.4 cm grade 3 invasive ductal carcinoma, margins -2 sentinel lymph nodes negative.   12/10/2021 -  Chemotherapy   Patient is on Treatment Plan : BREAST Paclitaxel + Trastuzumab q7d / Trastuzumab q21d     03/26/2022 - 04/22/2022 Radiation Therapy   03/26/2022 through 04/22/2022 Site Technique Total Dose (Gy) Dose per Fx (Gy) Completed Fx Beam Energies  Breast, Left: Breast_L 3D 42.56/42.56 2.66 16/16 6X  Breast, Left: Breast_L_Bst 3D 8/8 2 4/4 6X     05/2022 -  Anti-estrogen oral therapy   Anastrozole daily     CURRENT THERAPY: Herceptin/Anastrozole  INTERVAL HISTORY:  Patricia Marshall 77 y.o. female returns for follow up.  The patient, with a history of breast cancer, blood clots, and arthritis, presents for a routine follow-up. She reports experiencing ocular migraines, which she describes as looking through a kaleidoscope, lasting about 15-20 minutes. She has been managing these episodes with neck massages from her husband, which seem to alleviate the symptoms. She also reports muscle aches and pains, for which she has been seeing a physical therapist. She has found the therapy beneficial, particularly for her upper  body strength.  The patient has been recommended to start Lipitor for high cholesterol, which has increased from 217 to 251. She is concerned about  potential side effects, particularly as she is already on Eliquis for her blood clots and Anastrozole for her breast cancer. She also reports having arthritis in her knee and hip flexor, which has been causing her pain.  She has a history of dense breasts and is due for a mammogram. She also has a history of polyps and is due for a colonoscopy, which has been postponed due to her recent blood clot.  Rest of the pertinent 10 point ROS reviewed and neg.   Patient Active Problem List   Diagnosis Date Noted   Acute deep vein thrombosis (DVT) of left lower extremity (HCC) 04/23/2022   Port-A-Cath in place 02/04/2022   Genetic testing 12/07/2021   Malignant neoplasm of upper-inner quadrant of left breast in female, estrogen receptor positive (HCC) 10/19/2021   Piriformis syndrome of left side 07/12/2015   Degenerative arthritis of left knee 07/12/2015   BEE STING REACTION, LOCAL 10/24/2006    is allergic to other, ampicillin, and anesthesia [id number] [propofol].  MEDICAL HISTORY: Past Medical History:  Diagnosis Date   Arthritis    Breast cancer (HCC)    Chicken pox    High cholesterol    Personal history of chemotherapy    Personal history of radiation therapy    PONV (postoperative nausea and vomiting)    Vitamin D deficiency     SURGICAL HISTORY: Past Surgical History:  Procedure Laterality Date   APPENDECTOMY  1959   BACK SURGERY     1996   BREAST LUMPECTOMY WITH AXILLARY LYMPH NODE BIOPSY Left 11/15/2021   Procedure: LEFT BREAST LUMPECTOMY WITH AXILLARY SENTINEL LYMPH NODE BIOPSY;  Surgeon: Emelia Loron, MD;  Location: [facility];  Service: General;  Laterality: Left;   PORTACATH PLACEMENT Right 11/15/2021   Procedure: INSERTION PORT-A-CATH;  Surgeon: Emelia Loron, MD;  Location: [facility];  Service: General;  Laterality: Right;   TONSILLECTOMY AND ADENOIDECTOMY  1954    SOCIAL HISTORY: Social History   Socioeconomic History    Marital status: Married    Spouse name: Not on file   Number of children: Not on file   Years of education: Not on file   Highest education level: Not on file  Occupational History   Not on file  Tobacco Use   Smoking status: Never   Smokeless tobacco: Never  Vaping Use   Vaping status: Never Used  Substance and Sexual Activity   Alcohol use: Yes    Alcohol/week: 1.0 standard drink of alcohol    Types: 1 Glasses of wine per week   Drug use: Never   Sexual activity: Not on file  Other Topics Concern   Not on file  Social History Narrative   Married    Retired    International aid/development worker of Corporate investment banker Strain: Low Risk  (09/11/2022)   Overall Financial Resource Strain (CARDIA)    Difficulty of Paying Living Expenses: Not hard at all  Food Insecurity: No Food Insecurity (09/11/2022)   Hunger Vital Sign    Worried About Running Out of Food in the Last Year: Never true    Ran Out of Food in the Last Year: Never true  Transportation Needs: No Transportation Needs (09/11/2022)   PRAPARE - Administrator, Civil Service (Medical): No    Lack of Transportation (Non-Medical): No  Physical Activity: Insufficiently Active (09/11/2022)   Exercise Vital Sign    Days of Exercise per Week: 3 days    Minutes of Exercise per Session: 30 min  Stress: No Stress Concern Present (09/11/2022)   [employer] of Occupational Health - Occupational Stress Questionnaire    Feeling of Stress : Not at all  Social Connections: Socially Integrated (09/11/2022)   Social Connection and Isolation Panel [NHANES]    Frequency of Communication with Friends and Family: More than three times a week    Frequency of Social Gatherings with Friends and Family: More than three times a week    Attends Religious Services: More than 4 times per year    Active Member of [employer] or Organizations: Yes    Attends Engineer, structural: More than 4 times per year    Marital Status: Married   Catering manager Violence: Not At Risk (09/11/2022)   Humiliation, Afraid, Rape, and Kick questionnaire    Fear of Current or Ex-Partner: No    Emotionally Abused: No    Physically Abused: No    Sexually Abused: No    FAMILY HISTORY: Family History  Problem Relation Age of Onset   Stomach cancer Mother 35   Heart attack Father 22   Cancer Sister        dx mid 74s; unknown primary w/ mets; treated like ovarian cancer   Heart attack Brother 67   Heart attack Brother 10    PHYSICAL EXAMINATION  BP 129/64 (BP Location: Right Arm, Patient Position: Sitting)   Pulse 61   Temp 98.3 F (36.8 C) (Temporal)   Resp 16   Wt 125 lb 6.4 oz (56.9 kg)   SpO2 100%   BMI 20.87 kg/m   Physical Exam Constitutional:      Appearance: Normal appearance.  Cardiovascular:     Rate and Rhythm: Normal rate and regular rhythm.  Pulmonary:     Effort: Pulmonary effort is normal.     Breath sounds: Normal breath sounds.  Chest:     Comments: Extremely dense breasts. No def palpable masses. No regional adenopathy Musculoskeletal:     Cervical back: Normal range of motion and neck supple. No rigidity.  Lymphadenopathy:     Cervical: No cervical adenopathy.  Skin:    General: Skin is warm and dry.  Neurological:     General: No focal deficit present.     Mental Status: She is alert.       LABORATORY DATA:  CBC    Component Value Date/Time   WBC 4.6 01/15/2023 0853   RBC 4.86 01/15/2023 0853   HGB 13.8 01/15/2023 0853   HGB 12.1 06/25/2022 0820   HCT 42.9 01/15/2023 0853   HCT 36.8 06/26/2022 1120   PLT 158.0 01/15/2023 0853   PLT 116 (L) 06/25/2022 0820   MCV 88.2 01/15/2023 0853   MCH 29.4 12/31/2022 1412   MCHC 32.2 01/15/2023 0853   RDW 13.0 01/15/2023 0853   LYMPHSABS 1.4 01/15/2023 0853   MONOABS 0.4 01/15/2023 0853   EOSABS 0.1 01/15/2023 0853   BASOSABS 0.0 01/15/2023 0853    CMP     Component Value Date/Time   NA 138 01/15/2023 0853   K 4.2 01/15/2023 0853    CL 102 01/15/2023 0853   CO2 25 01/15/2023 0853   GLUCOSE 85 01/15/2023 0853   BUN 19 01/15/2023 0853   CREATININE 0.75 01/15/2023 0853   CREATININE 0.67 11/19/2022 0902   CREATININE 0.76 11/03/2019 4098  CALCIUM 10.2 01/15/2023 0853   PROT 7.9 01/15/2023 0853   ALBUMIN 4.7 01/15/2023 0853   AST 22 01/15/2023 0853   AST 16 11/19/2022 0902   ALT 14 01/15/2023 0853   ALT 10 11/19/2022 0902   ALKPHOS 98 01/15/2023 0853   BILITOT 0.8 01/15/2023 0853   BILITOT 0.6 11/19/2022 0902   GFRNONAA >60 11/19/2022 0902   GFRNONAA 78 11/03/2019 0921   GFRAA 91 11/03/2019 0921    ASSESSMENT and THERAPY PLAN:   Recurrent Venous Thromboembolism History of two clots, currently on Eliquis. Discussed the risk of a third clot and the potential need for lifelong anticoagulation. -Continue Eliquis. -Draw blood to investigate potential causes of recurrent clotting.  Hypercholesterolemia LDL cholesterol increased from 135 to 165 over the past year. Patient started on Lipitor 20mg  daily. -Continue Lipitor 20mg  daily.  Arthritis and Musculoskeletal Pain Patient reports arthritis in the knee and hip flexor pain. Currently undergoing physical therapy and considering aquatic therapy. -Continue physical therapy. -Consider non-weight bearing exercises such as biking, elliptical, or water exercises. -Consider over-the-counter topical treatments such as Voltaren gel or Bengay for pain relief.  Breast Cancer Surveillance Dense breasts with alternating mammogram and MRI recommended. Discussed the option of contrast-enhanced mammogram. Pt reluctant to do MRI. She is hoping to consider contrast enhanced mammogram.  General Health Maintenance -Administer influenza vaccine. -Continue Anastrozole. -Consider multivitamin supplementation as needed. -Schedule colonoscopy for Spring 2025, with consideration for holding Eliquis 2-3 days prior to procedure. Discussed risk of VTE while off of  anticoagulation. -Follow-up in 6 months.  Time spent: 40 min  *Total Encounter Time as defined by the Centers for Medicare and Medicaid Services includes, in addition to the face-to-face time of a patient visit (documented in the note above) non-face-to-face time: obtaining and reviewing outside history, ordering and reviewing medications, tests or procedures, care coordination (communications with other health care professionals or caregivers) and documentation in the medical record.

## 2023-02-27 ENCOUNTER — Other Ambulatory Visit: Payer: Self-pay

## 2023-02-27 ENCOUNTER — Telehealth: Payer: Self-pay | Admitting: Adult Health

## 2023-02-27 MED ORDER — ATORVASTATIN CALCIUM 20 MG PO TABS: 20.0000 mg | ORAL_TABLET | Freq: Every day | ORAL | 0 refills | Status: DC

## 2023-02-27 NOTE — Telephone Encounter (Signed)
Patient notified of update  and verbalized understanding. Rx sent to pharmacy.

## 2023-02-27 NOTE — Telephone Encounter (Signed)
Pt is calling and she spoke with her oncologist and she is ok with taking lipitor. Pt would like to know the reason why lipitor 20 mg and would like a callback  [facility] [address] [place] - [address] Phone: [phone]  Fax: [phone]

## 2023-02-28 ENCOUNTER — Inpatient Hospital Stay: Payer: Medicare HMO

## 2023-02-28 ENCOUNTER — Ambulatory Visit: Payer: Medicare HMO

## 2023-02-28 DIAGNOSIS — I824Z1 Acute embolism and thrombosis of unspecified deep veins of right distal lower extremity: Secondary | ICD-10-CM

## 2023-02-28 DIAGNOSIS — C50212 Malignant neoplasm of upper-inner quadrant of left female breast: Secondary | ICD-10-CM

## 2023-02-28 LAB — CBC WITH DIFFERENTIAL/PLATELET
Abs Immature Granulocytes: 0.01 10*3/uL (ref 0.00–0.07)
Basophils Absolute: 0 10*3/uL (ref 0.0–0.1)
Basophils Relative: 1 %
Eosinophils Absolute: 0 10*3/uL (ref 0.0–0.5)
Eosinophils Relative: 1 %
HCT: 39.5 % (ref 36.0–46.0)
Hemoglobin: 13 g/dL (ref 12.0–15.0)
Immature Granulocytes: 0 %
Lymphocytes Relative: 27 %
Lymphs Abs: 1.2 10*3/uL (ref 0.7–4.0)
MCH: 29.3 pg (ref 26.0–34.0)
MCHC: 32.9 g/dL (ref 30.0–36.0)
MCV: 89 fL (ref 80.0–100.0)
Monocytes Absolute: 0.4 10*3/uL (ref 0.1–1.0)
Monocytes Relative: 8 %
Neutro Abs: 2.9 10*3/uL (ref 1.7–7.7)
Neutrophils Relative %: 63 %
Platelets: 156 10*3/uL (ref 150–400)
RBC: 4.44 MIL/uL (ref 3.87–5.11)
RDW: 12.5 % (ref 11.5–15.5)
WBC: 4.5 10*3/uL (ref 4.0–10.5)
nRBC: 0 % (ref 0.0–0.2)

## 2023-02-28 LAB — ANTITHROMBIN III: AntiThromb III Func: 114 % (ref 75–120)

## 2023-02-28 LAB — TSH: TSH: 1.791 u[IU]/mL (ref 0.350–4.500)

## 2023-03-02 LAB — BETA-2-GLYCOPROTEIN I ABS, IGG/M/A
Beta-2 Glyco I IgG: <9 GPI IgG units (ref 0–20)
Beta-2-Glycoprotein I IgA: <9 GPI IgA units (ref 0–25)
Beta-2-Glycoprotein I IgM: <9 GPI IgM units (ref 0–32)

## 2023-03-03 ENCOUNTER — Ambulatory Visit (INDEPENDENT_AMBULATORY_CARE_PROVIDER_SITE_OTHER): Payer: Medicare HMO

## 2023-03-03 DIAGNOSIS — Z23 Encounter for immunization: Secondary | ICD-10-CM

## 2023-03-03 LAB — PTT-LA MIX: PTT-LA Mix: 103.2 s — ABNORMAL HIGH (ref 0.0–40.5)

## 2023-03-03 LAB — PROTEIN S ACTIVITY: Protein S Activity: 54 % — ABNORMAL LOW (ref 63–140)

## 2023-03-03 LAB — HEXAGONAL PHASE PHOSPHOLIPID: Hexagonal Phase Phospholipid: 24 s — ABNORMAL HIGH (ref 0–11)

## 2023-03-03 LAB — DRVVT CONFIRM: dRVVT Confirm: 1.4 {ratio} — ABNORMAL HIGH (ref 0.8–1.2)

## 2023-03-03 LAB — LUPUS ANTICOAGULANT PANEL
DRVVT: 124 s — ABNORMAL HIGH (ref 0.0–47.0)
PTT Lupus Anticoagulant: 128.8 s — ABNORMAL HIGH (ref 0.0–43.5)

## 2023-03-03 LAB — PROTEIN C ACTIVITY: Protein C Activity: 89 % (ref 73–180)

## 2023-03-03 LAB — DRVVT MIX: dRVVT Mix: 67.8 s — ABNORMAL HIGH (ref 0.0–40.4)

## 2023-03-04 LAB — CARDIOLIPIN ANTIBODIES, IGG, IGM, IGA
Anticardiolipin IgA: <9 [APL'U]/mL (ref 0–11)
Anticardiolipin IgG: <9 [GPL'U]/mL (ref 0–14)
Anticardiolipin IgM: <9 [MPL'U]/mL (ref 0–12)

## 2023-03-05 LAB — PROTHROMBIN GENE MUTATION

## 2023-03-06 ENCOUNTER — Telehealth: Payer: Self-pay | Admitting: *Deleted

## 2023-03-06 ENCOUNTER — Encounter: Payer: Self-pay | Admitting: Adult Health

## 2023-03-06 NOTE — Telephone Encounter (Signed)
Copied from CRM [id number]. Topic: Clinical - Medication Question >> Mar 06, 2023 12:07 PM Sim Boast F wrote: Reason for CRM: Patient started Atorvastatin med recently, has been experiencing soreness and can barely walk. Wants to know if this is a normal side effect of the medication.

## 2023-03-06 NOTE — Telephone Encounter (Signed)
Patient notified of update  and verbalized understanding. 

## 2023-03-06 NOTE — Telephone Encounter (Signed)
Please advise 

## 2023-03-06 NOTE — Therapy (Signed)
OUTPATIENT PHYSICAL THERAPY TREATMENT NOTE   Patient Name: Patricia Marshall MRN: [id number] DOB:30-Oct-1945, 77 y.o., female Today's Date: 03/07/2023  END OF SESSION:  PT End of Session - 03/07/23 1544     Visit Number 5    Date for PT Re-Evaluation 03/21/23    Authorization Type [employer]    Progress Note Due on Visit 10    PT Start Time 1430    PT Stop Time 1510    PT Time Calculation (min) 40 min    Activity Tolerance Patient tolerated treatment well    Behavior During Therapy WFL for tasks assessed/performed               Past Medical History:  Diagnosis Date   Arthritis    Breast cancer (HCC)    Chicken pox    High cholesterol    Personal history of chemotherapy    Personal history of radiation therapy    PONV (postoperative nausea and vomiting)    Vitamin D deficiency    Past Surgical History:  Procedure Laterality Date   APPENDECTOMY  1959   BACK SURGERY     1996   BREAST LUMPECTOMY WITH AXILLARY LYMPH NODE BIOPSY Left 11/15/2021   Procedure: LEFT BREAST LUMPECTOMY WITH AXILLARY SENTINEL LYMPH NODE BIOPSY;  Surgeon: Emelia Loron, MD;  Location: [facility];  Service: General;  Laterality: Left;   PORTACATH PLACEMENT Right 11/15/2021   Procedure: INSERTION PORT-A-CATH;  Surgeon: Emelia Loron, MD;  Location:  SUR[facility]ERY CENTER;  Service: General;  Laterality: Right;   TONSILLECTOMY AND ADENOIDECTOMY  1954   Patient Active Problem List   Diagnosis Date Noted   Acute deep vein thrombosis (DVT) of left lower extremity (HCC) 04/23/2022   Port-A-Cath in place 02/04/2022   Genetic testing 12/07/2021   Malignant neoplasm of upper-inner quadrant of left breast in female, estrogen receptor positive (HCC) 10/19/2021   Piriformis syndrome of left side 07/12/2015   Degenerative arthritis of left knee 07/12/2015   BEE STING REACTION, LOCAL 10/24/2006    PCP: [name], NP  REFERRING PROVIDER: [name],  NP  REFERRING DIAG: M25.50 (ICD-10-CM) - Multiple joint pain  THERAPY DIAG:  Muscle weakness (generalized)  Cramp and spasm  Abnormal posture  Rationale for Evaluation and Treatment: Rehabilitation  ONSET DATE: Since 2023, but has been getting worse since September 2024  SUBJECTIVE:                                                                                                                                                                                      SUBJECTIVE STATEMENT: I had a very good morning today.No current pain  Hand dominance: Right  PERTINENT HISTORY: Recent DVTs (cleared by Dr Al Pimple that she can participate in PT) Patient was diagnosed on 09/20/2021 with left grade 2 invasive ductal carcinoma breast cancer. She underwent a left lumpectomy and sentinel node biopsy (2 negative nodes) on 11/15/2021. 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 No  PRECAUTIONS: Other: L UE lymphedema risk, DVT (11/19/21)  RED FLAGS: None   WEIGHT BEARING RESTRICTIONS: No  FALLS:  Has patient fallen in last 6 months? No  LIVING ENVIRONMENT: Lives with: lives with their spouse Lives in: House/apartment Stairs:  about 14 (has 2 sets)  steps; on left going up and External: 3 steps; none Has following equipment at home: None  OCCUPATION: Retired  PLOF: Independent and Leisure: Pickleball, Geophysicist/field seismologist, formerly enjoyed Psychologist, educational and yoga  PATIENT GOALS:To be able to get back to a more normal state with less pain.  NEXT MD VISIT:  SOZO screen on 02/23/1013, Dr Al Pimple on 02/25/23  OBJECTIVE:  Note: Objective measures were completed at Evaluation unless otherwise noted.  DIAGNOSTIC FINDINGS:  Bone Density on 05/20/2022: The BMD measured at Femur Total Right is 0.715 g/cm2 with a T-score of -2.3.  PATIENT SURVEYS:  Eval:  Quick Dash 27.27  COGNITION: Overall cognitive  status: Within functional limits for tasks assessed     SENSATION: Denies numbness and tingling down arm  POSTURE: Slight forward rounded shoulder and forward head  UPPER EXTREMITY ROM:   01/27/2023: Right and Left shoulder flexion 140 degrees Right shoulder abduction 137 degrees Left shoulder abduction 125 degrees  UPPER EXTREMITY MMT:  01/27/2023: Right shoulder strength is WFL Left shoulder strength for flexion and abduction is WFL Left shoulder IR/ER is 4+/5 Grip strength:  right 52 lbs, left 28 lbs  LOWER EXTREMITY MMT:  01/27/2023: Right LE is WFL Left hip strength is grossly 4/5  FUNCTIONAL ASSESSMENT:  01/27/2023: 5 times sit to stand:  8.55 sec without UE support 3 minute walk test:  587 ft with RPE of 5/10 and left hip flexor pain of 6/10   TODAY'S TREATMENT:       03/07/23:Pt arrives for aquatic physical therapy. Treatment took place in 3.5-5.5 feet of water. Water temperature was 91 degree F. Pt entered the pool via stairs reciprocally but slowly uing the rails.  . Seated water bench with 75% submersion Pt performed seated LE AROM exercises 20x in all planes with concurrent review of current status and verbal education of water principles and how we would use them. Verbal understanding by pt. 75% water walking with natural arm swing 4x in each direction. Hip kicks 10x Bil 3 ways. High knee marching 4x across pool holding yellow floats. Underwater decompression 5 min with PTA assisting with pt trunk control, 3 min of bicycle.  DATE: 02/24/2023 Nustep level 5 x5 min with PT present to discuss status Standing hip flexor stretch with foot on 2nd stair 2x20 sec bilat Standing hip ADDuctor stretch at counter 2x 20 sec B L psoas release in hooklying Shoulder pulleys for flexion and abduction x2 min each Standing wall angels x10 Standing  facing the wall performing lower trap lift offs 2x10 Seated hamstring stretch 2x20 sec bilat Supine shoulder flexion with 2# on cane 2x10 Supine shoulder chest press with 2# on cane 2x10 (easy) Right sidelying:  left shoulder abduction 2x10  DATE: 02/17/2023 Nustep level 2 x5 min with PT present to discuss status Shoulder pulleys for flexion and abduction x2 min each Standing wall angels x10 Standing facing the wall performing lower trap lift offs 2x10 Seated hamstring stretch 2x20 sec bilat Standing hip flexor stretch with foot on 2nd stair 2x20 sec bilat Supine shoulder flexion with 2# on cane 2x10 Supine shoulder chest press with 2# on cane 2x10 Right sidelying:  left shoulder abduction 2x10     PATIENT EDUCATION: Education details: Issued HEP Person educated: Patient Education method: Programmer, multimedia, Facilities manager, and Handouts Education comprehension: verbalized understanding and returned demonstration  HOME EXERCISE PROGRAM: Access Code: [email or web address] URL: [email or web address] Date: 02/24/2023 Prepared by: Raynelle Fanning  Exercises - Seated Scapular Retraction  - 1 x daily - 7 x weekly - 2 sets - 10 reps - Standing Shoulder Abduction Slides at Wall  - 1 x daily - 7 x weekly - 2 sets - 10 reps - Wall Angels  - 1 x daily - 7 x weekly - 2 sets - 10 reps - Hip Flexor Stretch with Chair  - 1 x daily - 7 x weekly - 2 reps - 20 sec hold - Standing Shoulder Row with Anchored Resistance  - 1 x daily - 7 x weekly - 2 sets - 10 reps - Shoulder extension with resistance - Neutral  - 1 x daily - 7 x weekly - 2 sets - 10 reps - Supine Shoulder Flexion Extension AAROM with Dowel  - 1 x daily - 7 x weekly - 2 sets - 10 reps - Sidelying Shoulder Abduction Palm Forward  - 1 x daily - 7 x weekly - 2 sets - 10 reps - Side Lunge Adductor Stretch  - 2 x daily - 7 x weekly - 1 sets - 3 reps - 30-60 sec hold - Psoas Mobilization with Small Ball  - 1 x daily - 7 x weekly - 1 sets - 1 reps - 30-60  sec hold  ASSESSMENT:  CLINICAL IMPRESSION:Pt tolerated her first aquatic PT session very well,; no pain and only LE fatigue near the end of the session pt was educated on water principles.      OBJECTIVE IMPAIRMENTS: decreased activity tolerance, decreased balance, difficulty walking, decreased ROM, decreased strength, impaired perceived functional ability, increased muscle spasms, impaired flexibility, postural dysfunction, and pain.   ACTIVITY LIMITATIONS: carrying, lifting, standing, squatting, sleeping, and stairs  PARTICIPATION LIMITATIONS: cleaning, driving, shopping, and community activity  PERSONAL FACTORS: Past/current experiences, Time since onset of injury/illness/exacerbation, and 3+ comorbidities: breast cancer s/p lumpectomy with 2 sentinel lymph nodes removed, DVTs, Reynauds  are also affecting patient's functional outcome.   REHAB POTENTIAL: Good  CLINICAL DECISION MAKING: Evolving/moderate complexity  EVALUATION COMPLEXITY: Moderate   GOALS: Goals reviewed with patient? Yes  SHORT TERM GOALS: Target date: 02/07/2023  Patient will be independent with initial HEP. Baseline: Goal status: Met on 02/06/2023  2.  Patient will report at  least a 25% improvement in symptoms since starting PT. Baseline:  Goal status: Met on 02/17/2023   LONG TERM GOALS: Target date: 03/21/2023  Patient will be independent with advanced HEP. Baseline:  Goal status: Ongoing  2.  Patient will improve Quick DASH to no greater than 15 to demonstrate improvements functional tasks. Baseline: 27.27 Goal status: INITIAL  3.  Patient will increase her bilateral UE/LE strength to [facility] to allow her to perform all desired tasks, such as practicing her swing for Pickleball. Baseline:  Goal status: Ongoing  4.  Patient will report a decrease in pain to allow her to return to [employer] classes without increased pain. Baseline:  Goal status: INITIAL  5.  Patient will be able to  ambulate for at least 20 minutes without increased pain to allow her to have community ambulation. Baseline: 3 minute walk test:  587 ft with RPE of 5/10 and left hip flexor pain of 6/10 Goal status: INITIAL    PLAN:  PT FREQUENCY: 1-2x/week  PT DURATION: 8 weeks  PLANNED INTERVENTIONS: 97164- PT Re-evaluation, 97110-Therapeutic exercises, 97530- Therapeutic activity, 97112- Neuromuscular re-education, 97535- Self Care, 40981- Manual therapy, [id number]- Gait training, [id number]- Aquatic Therapy, 97014- Electrical stimulation (unattended), [id number]- Electrical stimulation (manual), [id number]- Vasopneumatic device, [id number]- Ultrasound, [id number]- Traction (mechanical), [id number]- Ionotophoresis 4mg /ml Dexamethasone, Patient/Family education, Balance training, Stair training, Taping, Dry Needling, Joint mobilization, Joint manipulation, Spinal manipulation, Spinal mobilization, Scar mobilization, Cryotherapy, and Moist heat  PLAN FOR NEXT SESSION: Assess and progress HEP as indicated, strengthening, flexibility, core stability. See how pt tolerated her first aquatic session.    Ane Payment, PTA 03/07/23 3:46 PM   [facility] [address] [place], [place] [address] Phone # [phone] Fax [phone]

## 2023-03-07 ENCOUNTER — Ambulatory Visit: Payer: Medicare HMO | Admitting: Physical Therapy

## 2023-03-07 ENCOUNTER — Encounter: Payer: Self-pay | Admitting: Physical Therapy

## 2023-03-07 DIAGNOSIS — R293 Abnormal posture: Secondary | ICD-10-CM | POA: Diagnosis not present

## 2023-03-07 DIAGNOSIS — M6281 Muscle weakness (generalized): Secondary | ICD-10-CM | POA: Diagnosis not present

## 2023-03-07 DIAGNOSIS — R252 Cramp and spasm: Secondary | ICD-10-CM | POA: Diagnosis not present

## 2023-03-07 NOTE — Telephone Encounter (Signed)
Please advise 

## 2023-03-13 NOTE — Therapy (Unsigned)
OUTPATIENT PHYSICAL THERAPY TREATMENT NOTE   Patient Name: Patricia Marshall MRN: [id number] DOB:[birth year], 77 y.o., female Today's Date: 03/14/2023  END OF SESSION:  PT End of Session - 03/14/23 1425     Visit Number 6    Date for PT Re-Evaluation 03/21/23    Authorization Type [employer] Medicare    Progress Note Due on Visit 10    PT Start Time 1430    PT Stop Time 1510    PT Time Calculation (min) 40 min    Activity Tolerance Patient tolerated treatment well    Behavior During Therapy WFL for tasks assessed/performed                Past Medical History:  Diagnosis Date   Arthritis    Breast cancer (HCC)    Chicken pox    High cholesterol    Personal history of chemotherapy    Personal history of radiation therapy    PONV (postoperative nausea and vomiting)    Vitamin D deficiency    Past Surgical History:  Procedure Laterality Date   APPENDECTOMY  1959   BACK SURGERY     1996   BREAST LUMPECTOMY WITH AXILLARY LYMPH NODE BIOPSY Left 11/15/2021   Procedure: LEFT BREAST LUMPECTOMY WITH AXILLARY SENTINEL LYMPH NODE BIOPSY;  Surgeon: Emelia Loron, MD;  Location: [facility];  Service: General;  Laterality: Left;   PORTACATH PLACEMENT Right 11/15/2021   Procedure: INSERTION PORT-A-CATH;  Surgeon: Emelia Loron, MD;  Location: [facility];  Service: General;  Laterality: Right;   TONSILLECTOMY AND ADENOIDECTOMY  1954   Patient Active Problem List   Diagnosis Date Noted   Acute deep vein thrombosis (DVT) of left lower extremity (HCC) 04/23/2022   Port-A-Cath in place 02/04/2022   Genetic testing 12/07/2021   Malignant neoplasm of upper-inner quadrant of left breast in female, estrogen receptor positive (HCC) 10/19/2021   Piriformis syndrome of left side 07/12/2015   Degenerative arthritis of left knee 07/12/2015   BEE STING REACTION, LOCAL 10/24/2006    PCP: [name], NP  REFERRING PROVIDER: [name],  NP  REFERRING DIAG: M25.50 (ICD-10-CM) - Multiple joint pain  THERAPY DIAG:  Muscle weakness (generalized)  Cramp and spasm  Abnormal posture  Rationale for Evaluation and Treatment: Rehabilitation  ONSET DATE: Since 2023, but has been getting worse since September 2024  SUBJECTIVE:                                                                                                                                                                                      SUBJECTIVE STATEMENT: After my first pool session I felt kind of  sea sick. My legs were tired and sore, I was surprised because I didn't do that much.   Hand dominance: Right  PERTINENT HISTORY: Recent DVTs (cleared by Dr Al Pimple that she can participate in PT) Patient was diagnosed on 09/20/2021 with left grade 2 invasive ductal carcinoma breast cancer. She underwent a left lumpectomy and sentinel node biopsy (2 negative nodes) on 11/15/2021. 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 No  PRECAUTIONS: Other: L UE lymphedema risk, DVT (11/19/21)  RED FLAGS: None   WEIGHT BEARING RESTRICTIONS: No  FALLS:  Has patient fallen in last 6 months? No  LIVING ENVIRONMENT: Lives with: lives with their spouse Lives in: House/apartment Stairs:  about 14 (has 2 sets)  steps; on left going up and External: 3 steps; none Has following equipment at home: None  OCCUPATION: Retired  PLOF: Independent and Leisure: Pickleball, Geophysicist/field seismologist, formerly enjoyed Psychologist, educational and yoga  PATIENT GOALS:To be able to get back to a more normal state with less pain.  NEXT MD VISIT:  SOZO screen on 02/23/1013, Dr Al Pimple on 02/25/23  OBJECTIVE:  Note: Objective measures were completed at Evaluation unless otherwise noted.  DIAGNOSTIC FINDINGS:  Bone Density on 05/20/2022: The BMD measured at Femur Total Right is 0.715 g/cm2 with a T-score of  -2.3.  PATIENT SURVEYS:  Eval:  Quick Dash 27.27  COGNITION: Overall cognitive status: Within functional limits for tasks assessed     SENSATION: Denies numbness and tingling down arm  POSTURE: Slight forward rounded shoulder and forward head  UPPER EXTREMITY ROM:   01/27/2023: Right and Left shoulder flexion 140 degrees Right shoulder abduction 137 degrees Left shoulder abduction 125 degrees  UPPER EXTREMITY MMT:  01/27/2023: Right shoulder strength is WFL Left shoulder strength for flexion and abduction is WFL Left shoulder IR/ER is 4+/5 Grip strength:  right 52 lbs, left 28 lbs  LOWER EXTREMITY MMT:  01/27/2023: Right LE is WFL Left hip strength is grossly 4/5  FUNCTIONAL ASSESSMENT:  01/27/2023: 5 times sit to stand:  8.55 sec without UE support 3 minute walk test:  587 ft with RPE of 5/10 and left hip flexor pain of 6/10   TODAY'S TREATMENT:    03/14/23:   Pt arrives for aquatic physical therapy. Treatment took place in 3.5-5.5 feet of water. Water temperature was 91 degree F. Pt entered the pool via stairs reciprocally but slowly uing the rails.  . Seated water bench with 75% submersion Pt performed seated LE AROM exercises 20x in all planes with concurrent review of current status with concurrent review of status and planning for todays session. Verbal understanding by pt. 75% water walking with natural arm swing 4x in each direction. Hip kicks 10x Bil 3 ways. High knee marching 4x across pool holding yellow floats.                          03/07/23:Pt arrives for aquatic physical therapy. Treatment took place in 3.5-5.5 feet of water. Water temperature was 91 degree F. Pt entered the pool via stairs reciprocally but slowly uing the rails.  . Seated water bench with 75% submersion Pt performed seated LE AROM exercises 20x in all planes with concurrent review of current status and verbal education of water principles and how we would use them. Verbal understanding  by pt. 75% water walking with natural arm  swing 4x in each direction. Hip kicks 10x Bil 3 ways. High knee marching 4x across pool holding yellow floats. Underwater decompression 5 min with PTA assisting with pt trunk control, 3 min of bicycle.                                                                                                                                    DATE: 02/24/2023 Nustep level 5 x5 min with PT present to discuss status Standing hip flexor stretch with foot on 2nd stair 2x20 sec bilat Standing hip ADDuctor stretch at counter 2x 20 sec B L psoas release in hooklying Shoulder pulleys for flexion and abduction x2 min each Standing wall angels x10 Standing facing the wall performing lower trap lift offs 2x10 Seated hamstring stretch 2x20 sec bilat Supine shoulder flexion with 2# on cane 2x10 Supine shoulder chest press with 2# on cane 2x10 (easy) Right sidelying:  left shoulder abduction 2x10    PATIENT EDUCATION: Education details: Issued HEP Person educated: Patient Education method: Programmer, multimedia, Facilities manager, and Handouts Education comprehension: verbalized understanding and returned demonstration  HOME EXERCISE PROGRAM: Access Code: [email or web address] URL: [email or web address] Date: 02/24/2023 Prepared by: Raynelle Fanning  Exercises - Seated Scapular Retraction  - 1 x daily - 7 x weekly - 2 sets - 10 reps - Standing Shoulder Abduction Slides at Wall  - 1 x daily - 7 x weekly - 2 sets - 10 reps - Wall Angels  - 1 x daily - 7 x weekly - 2 sets - 10 reps - Hip Flexor Stretch with Chair  - 1 x daily - 7 x weekly - 2 reps - 20 sec hold - Standing Shoulder Row with Anchored Resistance  - 1 x daily - 7 x weekly - 2 sets - 10 reps - Shoulder extension with resistance - Neutral  - 1 x daily - 7 x weekly - 2 sets - 10 reps - Supine Shoulder Flexion Extension AAROM with Dowel  - 1 x daily - 7 x weekly - 2 sets - 10 reps - Sidelying Shoulder Abduction Palm Forward  - 1 x  daily - 7 x weekly - 2 sets - 10 reps - Side Lunge Adductor Stretch  - 2 x daily - 7 x weekly - 1 sets - 3 reps - 30-60 sec hold - Psoas Mobilization with Small Ball  - 1 x daily - 7 x weekly - 1 sets - 1 reps - 30-60 sec hold  ASSESSMENT:  CLINICAL IMPRESSION: Pt had motion sickness and LE soreness and fatigue post initial pool session. We eliminated the float to see if this abolished the motion sickness (which pt feels like it did) and we kept the Rx the same as last time. Pt has no issues (pain mainly) when exercising in the pool.  OBJECTIVE IMPAIRMENTS: decreased activity tolerance, decreased balance, difficulty walking, decreased ROM, decreased strength, impaired perceived functional ability, increased muscle spasms, impaired flexibility,  postural dysfunction, and pain.   ACTIVITY LIMITATIONS: carrying, lifting, standing, squatting, sleeping, and stairs  PARTICIPATION LIMITATIONS: cleaning, driving, shopping, and community activity  PERSONAL FACTORS: Past/current experiences, Time since onset of injury/illness/exacerbation, and 3+ comorbidities: breast cancer s/p lumpectomy with 2 sentinel lymph nodes removed, DVTs, Reynauds  are also affecting patient's functional outcome.   REHAB POTENTIAL: Good  CLINICAL DECISION MAKING: Evolving/moderate complexity  EVALUATION COMPLEXITY: Moderate   GOALS: Goals reviewed with patient? Yes  SHORT TERM GOALS: Target date: 02/07/2023  Patient will be independent with initial HEP. Baseline: Goal status: Met on 02/06/2023  2.  Patient will report at least a 25% improvement in symptoms since starting PT. Baseline:  Goal status: Met on 02/17/2023   LONG TERM GOALS: Target date: 03/21/2023  Patient will be independent with advanced HEP. Baseline:  Goal status: Ongoing  2.  Patient will improve Quick DASH to no greater than 15 to demonstrate improvements functional tasks. Baseline: 27.27 Goal status: INITIAL  3.  Patient will increase her  bilateral UE/LE strength to [facility] to allow her to perform all desired tasks, such as practicing her swing for Pickleball. Baseline:  Goal status: Ongoing  4.  Patient will report a decrease in pain to allow her to return to [employer] classes without increased pain. Baseline:  Goal status: INITIAL  5.  Patient will be able to ambulate for at least 20 minutes without increased pain to allow her to have community ambulation. Baseline: 3 minute walk test:  587 ft with RPE of 5/10 and left hip flexor pain of 6/10 Goal status: INITIAL    PLAN:  PT FREQUENCY: 1-2x/week  PT DURATION: 8 weeks  PLANNED INTERVENTIONS: 97164- PT Re-evaluation, 97110-Therapeutic exercises, 97530- Therapeutic activity, 97112- Neuromuscular re-education, 97535- Self Care, 82956- Manual therapy, [id number]- Gait training, [id number]- Aquatic Therapy, 97014- Electrical stimulation (unattended), [id number]- Electrical stimulation (manual), [id number]- Vasopneumatic device, [id number]- Ultrasound, [id number]- Traction (mechanical), [id number]- Ionotophoresis 4mg /ml Dexamethasone, Patient/Family education, Balance training, Stair training, Taping, Dry Needling, Joint mobilization, Joint manipulation, Spinal manipulation, Spinal mobilization, Scar mobilization, Cryotherapy, and Moist heat  PLAN FOR NEXT SESSION: Assess and progress HEP as indicated, strengthening, flexibility, core stability. See if pt did better this time.    Ane Payment, PTA 03/14/23 4:13 PM   [facility] [address] [place], [place] [address] Phone # [phone] Fax [phone]

## 2023-03-14 ENCOUNTER — Ambulatory Visit: Payer: Medicare HMO | Admitting: Physical Therapy

## 2023-03-14 ENCOUNTER — Encounter: Payer: Self-pay | Admitting: Physical Therapy

## 2023-03-14 DIAGNOSIS — M6281 Muscle weakness (generalized): Secondary | ICD-10-CM | POA: Diagnosis not present

## 2023-03-14 DIAGNOSIS — R252 Cramp and spasm: Secondary | ICD-10-CM

## 2023-03-14 DIAGNOSIS — R293 Abnormal posture: Secondary | ICD-10-CM | POA: Diagnosis not present

## 2023-03-21 ENCOUNTER — Encounter: Payer: Self-pay | Admitting: Hematology and Oncology

## 2023-03-21 ENCOUNTER — Ambulatory Visit: Payer: Medicare Other | Attending: Adult Health | Admitting: Rehabilitative and Restorative Service Providers"

## 2023-03-21 ENCOUNTER — Encounter: Payer: Self-pay | Admitting: Rehabilitative and Restorative Service Providers"

## 2023-03-21 DIAGNOSIS — C50212 Malignant neoplasm of upper-inner quadrant of left female breast: Secondary | ICD-10-CM | POA: Insufficient documentation

## 2023-03-21 DIAGNOSIS — M6281 Muscle weakness (generalized): Secondary | ICD-10-CM | POA: Insufficient documentation

## 2023-03-21 DIAGNOSIS — R252 Cramp and spasm: Secondary | ICD-10-CM | POA: Diagnosis not present

## 2023-03-21 DIAGNOSIS — R293 Abnormal posture: Secondary | ICD-10-CM | POA: Insufficient documentation

## 2023-03-21 DIAGNOSIS — Z170 Estrogen receptor positive status [ER+]: Secondary | ICD-10-CM | POA: Diagnosis not present

## 2023-03-21 DIAGNOSIS — Z483 Aftercare following surgery for neoplasm: Secondary | ICD-10-CM | POA: Diagnosis not present

## 2023-03-21 NOTE — Therapy (Signed)
 OUTPATIENT PHYSICAL THERAPY TREATMENT NOTE AND REASSESSMENT NOTE   Patient Name: Patricia Marshall MRN: [id number] DOB:07-02-1945, 78 y.o., female Today's Date: 03/21/2023  Progress Note Reporting Period 01/27/2023 to 03/21/2023  See note below for Objective Data and Assessment of Progress/Goals.     END OF SESSION:  PT End of Session - 03/21/23 1104     Visit Number 7    Date for PT Re-Evaluation 04/18/23    Authorization Type [employer]    Progress Note Due on Visit 17    PT Start Time 1055    PT Stop Time 1140    PT Time Calculation (min) 45 min    Activity Tolerance Patient tolerated treatment well    Behavior During Therapy WFL for tasks assessed/performed                Past Medical History:  Diagnosis Date   Arthritis    Breast cancer (HCC)    Chicken pox    High cholesterol    Personal history of chemotherapy    Personal history of radiation therapy    PONV (postoperative nausea and vomiting)    Vitamin D  deficiency    Past Surgical History:  Procedure Laterality Date   APPENDECTOMY  1959   BACK SURGERY     1996   BREAST LUMPECTOMY WITH AXILLARY LYMPH NODE BIOPSY Left 11/15/2021   Procedure: LEFT BREAST LUMPECTOMY WITH AXILLARY SENTINEL LYMPH NODE BIOPSY;  Surgeon: Ebbie Cough, MD;  Location: [facility];  Service: General;  Laterality: Left;   PORTACATH PLACEMENT Right 11/15/2021   Procedure: INSERTION PORT-A-CATH;  Surgeon: Ebbie Cough, MD;  Location: [facility];  Service: General;  Laterality: Right;   TONSILLECTOMY AND ADENOIDECTOMY  1954   Patient Active Problem List   Diagnosis Date Noted   Acute deep vein thrombosis (DVT) of left lower extremity (HCC) 04/23/2022   Port-A-Cath in place 02/04/2022   Genetic testing 12/07/2021   Malignant neoplasm of upper-inner quadrant of left breast in female, estrogen receptor positive (HCC) 10/19/2021   Piriformis syndrome of left side 07/12/2015    Degenerative arthritis of left knee 07/12/2015   BEE STING REACTION, LOCAL 10/24/2006    PCP: Merna Huxley, NP  REFERRING PROVIDER: Merna Huxley, NP  REFERRING DIAG: M25.50 (ICD-10-CM) - Multiple joint pain  THERAPY DIAG:  Muscle weakness (generalized) - Plan: PT plan of care cert/re-cert  Cramp and spasm - Plan: PT plan of care cert/re-cert  Abnormal posture - Plan: PT plan of care cert/re-cert  Aftercare following surgery for neoplasm - Plan: PT plan of care cert/re-cert  Malignant neoplasm of upper-inner quadrant of left breast in female, estrogen receptor positive (HCC) - Plan: PT plan of care cert/re-cert  Rationale for Evaluation and Treatment: Rehabilitation  ONSET DATE: Since 2023, but has been getting worse since September 2024  SUBJECTIVE:  SUBJECTIVE STATEMENT:  Patient reports that she is progressing and would like to continue with PT until she goes to [place]  in February.  Patient reports that her arms have gotten much stronger and she is starting to develop visible muscles in her arms.  Hand dominance: Right  PERTINENT HISTORY: Recent DVTs (cleared by Dr Loretha that she can participate in PT) Patient was diagnosed on 09/20/2021 with left grade 2 invasive ductal carcinoma breast cancer. She underwent a left lumpectomy and sentinel node biopsy (2 negative nodes) on 11/15/2021. 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 Yes NPRS scale: 4/10 Pain location: bilat knees, primarily left PAIN TYPE: aching and sharp Pain description: intermittent  Aggravating factors: increased activity, and sometimes worse at night Relieving factors: uncertain   PRECAUTIONS: Other: L UE lymphedema  risk, DVT (11/19/21)  RED FLAGS: None   WEIGHT BEARING RESTRICTIONS: No  FALLS:  Has patient fallen in last 6 months? No  LIVING ENVIRONMENT: Lives with: lives with their spouse Lives in: House/apartment Stairs:  about 14 (has 2 sets)  steps; on left going up and External: 3 steps; none Has following equipment at home: None  OCCUPATION: Retired  PLOF: Independent and Leisure: Pickleball, Geophysicist/field Seismologist, formerly enjoyed Zumba and yoga  PATIENT GOALS:To be able to get back to a more normal state with less pain.  NEXT MD VISIT:  SOZO screen on 02/23/1013, Dr Loretha on 02/25/23  OBJECTIVE:  Note: Objective measures were completed at Evaluation unless otherwise noted.  DIAGNOSTIC FINDINGS:  Bone Density on 05/20/2022: The BMD measured at Femur Total Right is 0.715 g/cm2 with a T-score of -2.3.  PATIENT SURVEYS:  Eval:  Quick Hollis 27.27  03/21/2023:  Quick Dash 13.64  COGNITION: Overall cognitive status: Within functional limits for tasks assessed     SENSATION: Denies numbness and tingling down arm  POSTURE: Slight forward rounded shoulder and forward head  UPPER EXTREMITY ROM:   01/27/2023: Right and Left shoulder flexion 140 degrees Right shoulder abduction 137 degrees Left shoulder abduction 125 degrees  UPPER EXTREMITY MMT:  01/27/2023: Right shoulder strength is WFL Left shoulder strength for flexion and abduction is WFL Left shoulder IR/ER is 4+/5 Grip strength:  right 52 lbs, left 28 lbs  03/21/2023: Grip strength:  right 60 lbs, left 40 lbs  LOWER EXTREMITY MMT:  01/27/2023: Right LE is WFL Left hip strength is grossly 4/5  FUNCTIONAL ASSESSMENT:  01/27/2023: 5 times sit to stand:  8.55 sec without UE support 3 minute walk test:  587 ft with RPE of 5/10 and left hip flexor pain of 6/10  03/21/2023: 3 minute walk test:  608 ft with RPE of 2/10 and left hip flexor pain of 4/10   TODAY'S TREATMENT:    DATE: 03/21/2023 Nustep level 5 x7 min with  PT present to discuss status Seated hamstring stretch 2x20 sec bilat Standing hip flexor stretch with one foot on second stair 2x20 sec bilat Quick DASH 13.64 Seated shoulder flexion with 2# 2x10 Seated marching with 2# 2x10   03/14/23:   Pt arrives for aquatic physical therapy. Treatment took place in 3.5-5.5 feet of water. Water temperature was 91 degree F. Pt entered the pool via stairs reciprocally but slowly uing the rails.  . Seated water bench with 75% submersion Pt performed seated LE AROM exercises 20x in all planes with concurrent review of current  status with concurrent review of status and planning for todays session. Verbal understanding by pt. 75% water walking with natural arm swing 4x in each direction. Hip kicks 10x Bil 3 ways. High knee marching 4x across pool holding yellow floats.                          03/07/23:Pt arrives for aquatic physical therapy. Treatment took place in 3.5-5.5 feet of water. Water temperature was 91 degree F. Pt entered the pool via stairs reciprocally but slowly uing the rails.  . Seated water bench with 75% submersion Pt performed seated LE AROM exercises 20x in all planes with concurrent review of current status and verbal education of water principles and how we would use them. Verbal understanding by pt. 75% water walking with natural arm swing 4x in each direction. Hip kicks 10x Bil 3 ways. High knee marching 4x across pool holding yellow floats. Underwater decompression 5 min with PTA assisting with pt trunk control, 3 min of bicycle.                                                                                                                                      PATIENT EDUCATION: Education details: Issued HEP Person educated: Patient Education method: Explanation, Facilities Manager, and Handouts Education comprehension: verbalized understanding and returned demonstration  HOME EXERCISE PROGRAM: Access Code: [email or web address] URL:  [email or web address] Date: 02/24/2023 Prepared by: Mliss  Exercises - Seated Scapular Retraction  - 1 x daily - 7 x weekly - 2 sets - 10 reps - Standing Shoulder Abduction Slides at Wall  - 1 x daily - 7 x weekly - 2 sets - 10 reps - Wall Angels  - 1 x daily - 7 x weekly - 2 sets - 10 reps - Hip Flexor Stretch with Chair  - 1 x daily - 7 x weekly - 2 reps - 20 sec hold - Standing Shoulder Row with Anchored Resistance  - 1 x daily - 7 x weekly - 2 sets - 10 reps - Shoulder extension with resistance - Neutral  - 1 x daily - 7 x weekly - 2 sets - 10 reps - Supine Shoulder Flexion Extension AAROM with Dowel  - 1 x daily - 7 x weekly - 2 sets - 10 reps - Sidelying Shoulder Abduction Palm Forward  - 1 x daily - 7 x weekly - 2 sets - 10 reps - Side Lunge Adductor Stretch  - 2 x daily - 7 x weekly - 1 sets - 3 reps - 30-60 sec hold - Psoas Mobilization with Small Ball  - 1 x daily - 7 x weekly - 1 sets - 1 reps - 30-60 sec hold  ASSESSMENT:  CLINICAL IMPRESSION:  Ms Kington presents to skilled PT reporting that she can tell that her arms have gotten a lot stronger, but she is hoping to focus  more on her leg strengthening prior to going to [place]  in February.  Patient with greatly improved grip strength and her UE strength has improved to [facility] and she is able to pick up objects easier now than previously.  Patient with a significant improvement with her DASH score, as well.  Patient has had improvement with her 3 minute walk test with less dyspnea and less pain than at initial evaluation.  Patient did require cuing with hip flexor stretch and she reported feeling a better stretch after cuing.  Patient has not yet met all goals and would benefit from skilled PT of 1-2x/week for 4 additional weeks to progress towards remaining goals to allow patient to go to [place]  with improved confidence.   OBJECTIVE IMPAIRMENTS: decreased activity tolerance, decreased balance, difficulty walking,  decreased ROM, decreased strength, impaired perceived functional ability, increased muscle spasms, impaired flexibility, postural dysfunction, and pain.   ACTIVITY LIMITATIONS: carrying, lifting, standing, squatting, sleeping, and stairs  PARTICIPATION LIMITATIONS: cleaning, driving, shopping, and community activity  PERSONAL FACTORS: Past/current experiences, Time since onset of injury/illness/exacerbation, and 3+ comorbidities: breast cancer s/p lumpectomy with 2 sentinel lymph nodes removed, DVTs, Reynauds  are also affecting patient's functional outcome.   REHAB POTENTIAL: Good  CLINICAL DECISION MAKING: Evolving/moderate complexity  EVALUATION COMPLEXITY: Moderate   GOALS: Goals reviewed with patient? Yes  SHORT TERM GOALS: Target date: 02/07/2023  Patient will be independent with initial HEP. Baseline: Goal status: Met on 02/06/2023  2.  Patient will report at least a 25% improvement in symptoms since starting PT. Baseline:  Goal status: Met on 02/17/2023   LONG TERM GOALS: Target date: 04/18/2023  Patient will be independent with advanced HEP. Baseline:  Goal status: Ongoing  2.  Patient will improve Quick DASH to no greater than 15 to demonstrate improvements functional tasks. Baseline: 27.27 Goal status: Met on 03/21/2023  3.  Patient will increase her bilateral UE/LE strength to [facility] to allow her to perform all desired tasks, such as practicing her swing for Pickleball. Baseline:  Goal status: Met on 03/21/2023  4.  Patient will report a decrease in pain to allow her to return to [employer] classes without increased pain. Baseline:  Goal status: Ongoing  5.  Patient will be able to ambulate for at least 20 minutes without increased pain to allow her to have community ambulation. Baseline: 3 minute walk test:  587 ft with RPE of 5/10 and left hip flexor pain of 6/10 Goal status: Ongoing (see above)    PLAN:  PT FREQUENCY: 1-2x/week  PT DURATION: 4  weeks  PLANNED INTERVENTIONS: 97164- PT Re-evaluation, 97110-Therapeutic exercises, 97530- Therapeutic activity, 97112- Neuromuscular re-education, 97535- Self Care, 02859- Manual therapy, [id number]- Gait training, [id number]- Aquatic Therapy, 97014- Electrical stimulation (unattended), [id number]- Electrical stimulation (manual), 97016- Vasopneumatic device, [id number]- Ultrasound, [id number]- Traction (mechanical), [id number]- Ionotophoresis 4mg /ml Dexamethasone , Patient/Family education, Balance training, Stair training, Taping, Dry Needling, Joint mobilization, Joint manipulation, Spinal manipulation, Spinal mobilization, Scar mobilization, Cryotherapy, and Moist heat  PLAN FOR NEXT SESSION: Assess and progress HEP as indicated, strengthening, flexibility, core stability   Jarrell Laming, PT, DPT 03/21/23, 11:52 AM  [facility] [address] [place], [place] [address] Phone # [phone] Fax [phone]

## 2023-03-26 ENCOUNTER — Encounter: Payer: Self-pay | Admitting: Rehabilitative and Restorative Service Providers"

## 2023-03-26 ENCOUNTER — Other Ambulatory Visit: Payer: Self-pay | Admitting: Physician Assistant

## 2023-03-26 ENCOUNTER — Ambulatory Visit: Payer: Self-pay | Admitting: Rehabilitative and Restorative Service Providers"

## 2023-03-26 DIAGNOSIS — Z170 Estrogen receptor positive status [ER+]: Secondary | ICD-10-CM | POA: Diagnosis not present

## 2023-03-26 DIAGNOSIS — C50212 Malignant neoplasm of upper-inner quadrant of left female breast: Secondary | ICD-10-CM | POA: Diagnosis not present

## 2023-03-26 DIAGNOSIS — R293 Abnormal posture: Secondary | ICD-10-CM

## 2023-03-26 DIAGNOSIS — R252 Cramp and spasm: Secondary | ICD-10-CM

## 2023-03-26 DIAGNOSIS — Z483 Aftercare following surgery for neoplasm: Secondary | ICD-10-CM

## 2023-03-26 DIAGNOSIS — M6281 Muscle weakness (generalized): Secondary | ICD-10-CM | POA: Diagnosis not present

## 2023-03-26 NOTE — Therapy (Signed)
 OUTPATIENT PHYSICAL THERAPY TREATMENT NOTE   Patient Name: Patricia Marshall MRN: [id number] DOB:06-May-1945, 78 y.o., female Today's Date: 03/26/2023    END OF SESSION:  PT End of Session - 03/26/23 0800     Visit Number 8    Date for PT Re-Evaluation 04/18/23    Authorization Type [employer] Medicare    Progress Note Due on Visit 17    PT Start Time 0755    PT Stop Time 0840    PT Time Calculation (min) 45 min    Activity Tolerance Patient tolerated treatment well    Behavior During Therapy WFL for tasks assessed/performed                Past Medical History:  Diagnosis Date   Arthritis    Breast cancer (HCC)    Chicken pox    High cholesterol    Personal history of chemotherapy    Personal history of radiation therapy    PONV (postoperative nausea and vomiting)    Vitamin D  deficiency    Past Surgical History:  Procedure Laterality Date   APPENDECTOMY  1959   BACK SURGERY     1996   BREAST LUMPECTOMY WITH AXILLARY LYMPH NODE BIOPSY Left 11/15/2021   Procedure: LEFT BREAST LUMPECTOMY WITH AXILLARY SENTINEL LYMPH NODE BIOPSY;  Surgeon: Ebbie Cough, MD;  Location: [facility];  Service: General;  Laterality: Left;   PORTACATH PLACEMENT Right 11/15/2021   Procedure: INSERTION PORT-A-CATH;  Surgeon: Ebbie Cough, MD;  Location: [facility];  Service: General;  Laterality: Right;   TONSILLECTOMY AND ADENOIDECTOMY  1954   Patient Active Problem List   Diagnosis Date Noted   Acute deep vein thrombosis (DVT) of left lower extremity (HCC) 04/23/2022   Port-A-Cath in place 02/04/2022   Genetic testing 12/07/2021   Malignant neoplasm of upper-inner quadrant of left breast in female, estrogen receptor positive (HCC) 10/19/2021   Piriformis syndrome of left side 07/12/2015   Degenerative arthritis of left knee 07/12/2015   BEE STING REACTION, LOCAL 10/24/2006    PCP: Merna Huxley, NP  REFERRING PROVIDER: Merna Huxley,  NP  REFERRING DIAG: M25.50 (ICD-10-CM) - Multiple joint pain  THERAPY DIAG:  Muscle weakness (generalized)  Cramp and spasm  Abnormal posture  Aftercare following surgery for neoplasm  Malignant neoplasm of upper-inner quadrant of left breast in female, estrogen receptor positive (HCC)  Rationale for Evaluation and Treatment: Rehabilitation  ONSET DATE: Since 2023, but has been getting worse since September 2024  SUBJECTIVE:  SUBJECTIVE STATEMENT:  Patient reports that she has been doing okay, but did have a cramp in her left quad yesterday.  Hand dominance: Right  PERTINENT HISTORY: Recent DVTs (cleared by Dr Loretha that she can participate in PT) Patient was diagnosed on 09/20/2021 with left grade 2 invasive ductal carcinoma breast cancer. She underwent a left lumpectomy and sentinel node biopsy (2 negative nodes) on 11/15/2021. 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 Yes NPRS scale: 1/10 Pain location: bilat knees, primarily left PAIN TYPE: aching and sharp Pain description: intermittent  Aggravating factors: increased activity, and sometimes worse at night Relieving factors: uncertain   PRECAUTIONS: Other: L UE lymphedema risk, DVT (11/19/21)  RED FLAGS: None   WEIGHT BEARING RESTRICTIONS: No  FALLS:  Has patient fallen in last 6 months? No  LIVING ENVIRONMENT: Lives with: lives with their spouse Lives in: House/apartment Stairs:  about 14 (has 2 sets)  steps; on left going up and External: 3 steps; none Has following equipment at home: None  OCCUPATION: Retired  PLOF: Independent and Leisure: Pickleball, Geophysicist/field Seismologist, formerly enjoyed Zumba and yoga  PATIENT GOALS:To  be able to get back to a more normal state with less pain.  NEXT MD VISIT:  SOZO screen on 02/23/1013, Dr Loretha on 02/25/23  OBJECTIVE:  Note: Objective measures were completed at Evaluation unless otherwise noted.  DIAGNOSTIC FINDINGS:  Bone Density on 05/20/2022: The BMD measured at Femur Total Right is 0.715 g/cm2 with a T-score of -2.3.  PATIENT SURVEYS:  Eval:  Quick Hollis 27.27  03/21/2023:  Quick Dash 13.64  COGNITION: Overall cognitive status: Within functional limits for tasks assessed     SENSATION: Denies numbness and tingling down arm  POSTURE: Slight forward rounded shoulder and forward head  UPPER EXTREMITY ROM:   01/27/2023: Right and Left shoulder flexion 140 degrees Right shoulder abduction 137 degrees Left shoulder abduction 125 degrees  UPPER EXTREMITY MMT:  01/27/2023: Right shoulder strength is WFL Left shoulder strength for flexion and abduction is WFL Left shoulder IR/ER is 4+/5 Grip strength:  right 52 lbs, left 28 lbs  03/21/2023: Grip strength:  right 60 lbs, left 40 lbs  LOWER EXTREMITY MMT:  01/27/2023: Right LE is WFL Left hip strength is grossly 4/5  FUNCTIONAL ASSESSMENT:  01/27/2023: 5 times sit to stand:  8.55 sec without UE support 3 minute walk test:  587 ft with RPE of 5/10 and left hip flexor pain of 6/10  03/21/2023: 3 minute walk test:  608 ft with RPE of 2/10 and left hip flexor pain of 4/10   TODAY'S TREATMENT:    DATE: 03/26/2023 Nustep level 5 x7 min with PT present to discuss status Seated hamstring stretch 2x20 sec bilat Standing hip flexor stretch with one foot on second stair 2x20 sec bilat Seated shoulder flexion with 3# 2x10 Supine shoulder flexion with 3# x10 bilat Supine bridge 2x10 Sidelying shoulder abduction with 3# x10 bilat Supine clamshell with green tband 2x10 Supine marching with green tband x10 Supine serratus punch with 3# x10 bilat   DATE: 03/21/2023 Nustep level 5 x7 min with PT present to  discuss status Seated hamstring stretch 2x20 sec bilat Standing hip flexor stretch with one foot on second stair 2x20 sec bilat Quick DASH 13.64 Seated shoulder flexion with 2# 2x10 Seated marching with 2# 2x10   03/14/23:   Pt arrives  for aquatic physical therapy. Treatment took place in 3.5-5.5 feet of water. Water temperature was 91 degree F. Pt entered the pool via stairs reciprocally but slowly uing the rails.  . Seated water bench with 75% submersion Pt performed seated LE AROM exercises 20x in all planes with concurrent review of current status with concurrent review of status and planning for todays session. Verbal understanding by pt. 75% water walking with natural arm swing 4x in each direction. Hip kicks 10x Bil 3 ways. High knee marching 4x across pool holding yellow floats.                            PATIENT EDUCATION: Education details: Issued HEP Person educated: Patient Education method: Explanation, Facilities Manager, and Handouts Education comprehension: verbalized understanding and returned demonstration  HOME EXERCISE PROGRAM: Access Code: [email or web address] URL: [email or web address] Date: 03/26/2023 Prepared by: Jarrell Blaise Palladino  Exercises - Wall Angels  - 1 x daily - 7 x weekly - 2 sets - 10 reps - Standing Shoulder Row with Anchored Resistance  - 1 x daily - 7 x weekly - 2 sets - 10 reps - Shoulder extension with resistance - Neutral  - 1 x daily - 7 x weekly - 2 sets - 10 reps - Side Lunge Adductor Stretch  - 1 x daily - 7 x weekly - 1 sets - 3 reps - 30-60 sec hold - Hip Flexor Stretch with Chair  - 1 x daily - 7 x weekly - 2 reps - 20 sec hold - Seated Hamstring Stretch  - 1 x daily - 7 x weekly - 2 reps - 20 sec hold - Supine Bridge  - 1 x daily - 7 x weekly - 2 sets - 10 reps - Hooklying Clamshell with Resistance  - 1 x daily - 7 x weekly - 2 sets - 10 reps - Supine March with Resistance Band  - 1 x daily - 7 x weekly - 1-2 sets - 10 reps - Supine Shoulder  Flexion Extension Full Range AROM  - 1 x daily - 7 x weekly - 2 sets - 10 reps - Supine Single Arm Scapular Protraction  - 1 x daily - 7 x weekly - 3 sets - 10 reps - Sidelying Shoulder Abduction Full Range of Motion  - 1 x daily - 7 x weekly - 1-2 sets - 10 reps - Psoas Mobilization with Small Ball  - 1 x daily - 7 x weekly - 1 sets - 1 reps - 30-60 sec hold  ASSESSMENT:  CLINICAL IMPRESSION:  Ms Marcon presents to skilled PT reporting that she had some left quad cramping yesterday, but is feeling better today.  Patient is progressing well and independent with previous HEP, so provided patient with updated HEP to provide more of a challenge and add in additional LE strengthening.  Patient requires cuing with hooklying marching for improved core activation to decrease strain on her back.  Patient provided with green theraband and updated handout of new HEP to continue to progress strengthening and ROM at home.  Patient continues to require skilled PT to progress towards goal related activities.   OBJECTIVE IMPAIRMENTS: decreased activity tolerance, decreased balance, difficulty walking, decreased ROM, decreased strength, impaired perceived functional ability, increased muscle spasms, impaired flexibility, postural dysfunction, and pain.   ACTIVITY LIMITATIONS: carrying, lifting, standing, squatting, sleeping, and stairs  PARTICIPATION LIMITATIONS: cleaning, driving, shopping, and community activity  PERSONAL FACTORS: Past/current experiences, Time since  onset of injury/illness/exacerbation, and 3+ comorbidities: breast cancer s/p lumpectomy with 2 sentinel lymph nodes removed, DVTs, Reynauds  are also affecting patient's functional outcome.   REHAB POTENTIAL: Good  CLINICAL DECISION MAKING: Evolving/moderate complexity  EVALUATION COMPLEXITY: Moderate   GOALS: Goals reviewed with patient? Yes  SHORT TERM GOALS: Target date: 02/07/2023  Patient will be independent with initial  HEP. Baseline: Goal status: Met on 02/06/2023  2.  Patient will report at least a 25% improvement in symptoms since starting PT. Baseline:  Goal status: Met on 02/17/2023   LONG TERM GOALS: Target date: 04/18/2023  Patient will be independent with advanced HEP. Baseline:  Goal status: Ongoing  2.  Patient will improve Quick DASH to no greater than 15 to demonstrate improvements functional tasks. Baseline: 27.27 Goal status: Met on 03/21/2023  3.  Patient will increase her bilateral UE/LE strength to [facility] to allow her to perform all desired tasks, such as practicing her swing for Pickleball. Baseline:  Goal status: Met on 03/21/2023  4.  Patient will report a decrease in pain to allow her to return to [employer] classes without increased pain. Baseline:  Goal status: Ongoing  5.  Patient will be able to ambulate for at least 20 minutes without increased pain to allow her to have community ambulation. Baseline: 3 minute walk test:  587 ft with RPE of 5/10 and left hip flexor pain of 6/10 Goal status: Ongoing (see above)    PLAN:  PT FREQUENCY: 1-2x/week  PT DURATION: 4 weeks  PLANNED INTERVENTIONS: 97164- PT Re-evaluation, 97110-Therapeutic exercises, 97530- Therapeutic activity, 97112- Neuromuscular re-education, 97535- Self Care, 02859- Manual therapy, [id number]- Gait training, [phone]- Aquatic Therapy, 97014- Electrical stimulation (unattended), [id number]- Electrical stimulation (manual), 97016- Vasopneumatic device, [id number]- Ultrasound, [id number]- Traction (mechanical), [id number]- Ionotophoresis 4mg /ml Dexamethasone , Patient/Family education, Balance training, Stair training, Taping, Dry Needling, Joint mobilization, Joint manipulation, Spinal manipulation, Spinal mobilization, Scar mobilization, Cryotherapy, and Moist heat  PLAN FOR NEXT SESSION: Assess and progress HEP as indicated, strengthening, flexibility, core stability   Jarrell Laming, PT, DPT 03/26/23, 10:02 AM  [facility] [address] [place], [place] [address] Phone # [phone] Fax [phone]

## 2023-03-27 ENCOUNTER — Other Ambulatory Visit: Payer: Self-pay | Admitting: Physician Assistant

## 2023-03-27 ENCOUNTER — Telehealth: Payer: Self-pay | Admitting: *Deleted

## 2023-03-27 NOTE — Telephone Encounter (Signed)
 This RN spoke with pt per her call asking about abnormal lab findings from draw done in December - as she is planning to go to [place]  for a month and wanted to verify if she needs to come in sooner then scheduled appt in March.  Per review with MD- labs are abnormal based on known blood clot and being on the Eliquis - no issues that need further recommendations at this time.  Appt for March is appropriate.  Informed pt - note she states prior to this ca;; she was in the kitchen and cut her finger will it eventually stop bleeding ?  This RN informed pt to hold pressure with finger kept above heart level.

## 2023-03-28 ENCOUNTER — Encounter: Payer: Self-pay | Admitting: Hematology and Oncology

## 2023-03-28 MED ORDER — APIXABAN 5 MG PO TABS: 5.0000 mg | ORAL_TABLET | Freq: Two times a day (BID) | ORAL | 6 refills | Status: DC

## 2023-04-01 ENCOUNTER — Ambulatory Visit: Payer: Medicare Other | Admitting: Rehabilitative and Restorative Service Providers"

## 2023-04-01 ENCOUNTER — Encounter: Payer: Self-pay | Admitting: Rehabilitative and Restorative Service Providers"

## 2023-04-01 DIAGNOSIS — C50212 Malignant neoplasm of upper-inner quadrant of left female breast: Secondary | ICD-10-CM | POA: Diagnosis not present

## 2023-04-01 DIAGNOSIS — R252 Cramp and spasm: Secondary | ICD-10-CM | POA: Diagnosis not present

## 2023-04-01 DIAGNOSIS — Z170 Estrogen receptor positive status [ER+]: Secondary | ICD-10-CM | POA: Diagnosis not present

## 2023-04-01 DIAGNOSIS — R293 Abnormal posture: Secondary | ICD-10-CM

## 2023-04-01 DIAGNOSIS — M6281 Muscle weakness (generalized): Secondary | ICD-10-CM | POA: Diagnosis not present

## 2023-04-01 DIAGNOSIS — Z483 Aftercare following surgery for neoplasm: Secondary | ICD-10-CM | POA: Diagnosis not present

## 2023-04-01 NOTE — Therapy (Signed)
 OUTPATIENT PHYSICAL THERAPY TREATMENT NOTE   Patient Name: Patricia Marshall MRN: [id number] DOB:Nov 06, 1945, 78 y.o., female Today's Date: 04/01/2023    END OF SESSION:  PT End of Session - 04/01/23 1137     Visit Number 9    Date for PT Re-Evaluation 04/18/23    Authorization Type [employer]    Progress Note Due on Visit 17    PT Start Time 1135    PT Stop Time 1215    PT Time Calculation (min) 40 min    Activity Tolerance Patient tolerated treatment well    Behavior During Therapy WFL for tasks assessed/performed                Past Medical History:  Diagnosis Date   Arthritis    Breast cancer (HCC)    Chicken pox    High cholesterol    Personal history of chemotherapy    Personal history of radiation therapy    PONV (postoperative nausea and vomiting)    Vitamin D  deficiency    Past Surgical History:  Procedure Laterality Date   APPENDECTOMY  1959   BACK SURGERY     1996   BREAST LUMPECTOMY WITH AXILLARY LYMPH NODE BIOPSY Left 11/15/2021   Procedure: LEFT BREAST LUMPECTOMY WITH AXILLARY SENTINEL LYMPH NODE BIOPSY;  Surgeon: Ebbie Cough, MD;  Location: [facility];  Service: General;  Laterality: Left;   PORTACATH PLACEMENT Right 11/15/2021   Procedure: INSERTION PORT-A-CATH;  Surgeon: Ebbie Cough, MD;  Location: [facility];  Service: General;  Laterality: Right;   TONSILLECTOMY AND ADENOIDECTOMY  1954   Patient Active Problem List   Diagnosis Date Noted   Acute deep vein thrombosis (DVT) of left lower extremity (HCC) 04/23/2022   Port-A-Cath in place 02/04/2022   Genetic testing 12/07/2021   Malignant neoplasm of upper-inner quadrant of left breast in female, estrogen receptor positive (HCC) 10/19/2021   Piriformis syndrome of left side 07/12/2015   Degenerative arthritis of left knee 07/12/2015   BEE STING REACTION, LOCAL 10/24/2006    PCP: Merna Huxley, NP  REFERRING PROVIDER: Merna Huxley,  NP  REFERRING DIAG: M25.50 (ICD-10-CM) - Multiple joint pain  THERAPY DIAG:  Muscle weakness (generalized)  Cramp and spasm  Abnormal posture  Rationale for Evaluation and Treatment: Rehabilitation  ONSET DATE: Since 2023, but has been getting worse since September 2024  SUBJECTIVE:                                                                                                                                                                                      SUBJECTIVE STATEMENT:  Patient reports that the 3# arm  weights were too much and she had some increased soreness after.  Hand dominance: Right  PERTINENT HISTORY: Recent DVTs (cleared by Dr Loretha that she can participate in PT) Patient was diagnosed on 09/20/2021 with left grade 2 invasive ductal carcinoma breast cancer. She underwent a left lumpectomy and sentinel node biopsy (2 negative nodes) on 11/15/2021. 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 Yes NPRS scale: 3/10 Pain location: hip flexor and right ring finger PAIN TYPE: aching and sharp Pain description: intermittent  Aggravating factors: increased activity, and sometimes worse at night Relieving factors: uncertain   PRECAUTIONS: Other: L UE lymphedema risk, DVT (11/19/21)  RED FLAGS: None   WEIGHT BEARING RESTRICTIONS: No  FALLS:  Has patient fallen in last 6 months? No  LIVING ENVIRONMENT: Lives with: lives with their spouse Lives in: House/apartment Stairs:  about 14 (has 2 sets)  steps; on left going up and External: 3 steps; none Has following equipment at home: None  OCCUPATION: Retired  PLOF: Independent and Leisure: Pickleball, Geophysicist/field Seismologist, formerly enjoyed Zumba and yoga  PATIENT GOALS:To be able to get back to a more normal state with less pain.  NEXT MD VISIT:  SOZO screen on 02/23/1013, Dr Loretha on 02/25/23  OBJECTIVE:   Note: Objective measures were completed at Evaluation unless otherwise noted.  DIAGNOSTIC FINDINGS:  Bone Density on 05/20/2022: The BMD measured at Femur Total Right is 0.715 g/cm2 with a T-score of -2.3.  PATIENT SURVEYS:  Eval:  Quick [place]is 27.27  03/21/2023:  Quick Dash 13.64  COGNITION: Overall cognitive status: Within functional limits for tasks assessed     SENSATION: Denies numbness and tingling down arm  POSTURE: Slight forward rounded shoulder and forward head  UPPER EXTREMITY ROM:   01/27/2023: Right and Left shoulder flexion 140 degrees Right shoulder abduction 137 degrees Left shoulder abduction 125 degrees  UPPER EXTREMITY MMT:  01/27/2023: Right shoulder strength is WFL Left shoulder strength for flexion and abduction is WFL Left shoulder IR/ER is 4+/5 Grip strength:  right 52 lbs, left 28 lbs  03/21/2023: Grip strength:  right 60 lbs, left 40 lbs  LOWER EXTREMITY MMT:  01/27/2023: Right LE is WFL Left hip strength is grossly 4/5  FUNCTIONAL ASSESSMENT:  01/27/2023: 5 times sit to stand:  8.55 sec without UE support 3 minute walk test:  587 ft with RPE of 5/10 and left hip flexor pain of 6/10  03/21/2023: 3 minute walk test:  608 ft with RPE of 2/10 and left hip flexor pain of 4/10   TODAY'S TREATMENT:    DATE: 04/01/2023 Nustep level 5 x6 min with PT present to discuss status Seated hamstring stretch 2x20 sec bilat Seated piriformis stretch 2x20 sec bilat Supine shoulder flexion with 1# 2x10 bilat Supine serratus punch with 1# 2x10 bilat Supine marching with yellow loop  2x10 Supine clamshell with green tband 2x10 Supine bridge 2x10 Sidelying shoulder abduction with 1# x10 bilat Supine straight leg raise 2x10 bilat Supine lower trunk rotation x10 bilat   DATE: 03/26/2023 Nustep level 5 x7 min with PT present to discuss status Seated hamstring stretch 2x20 sec bilat Standing hip flexor stretch with one foot on second stair 2x20 sec  bilat Seated shoulder flexion with 3# 2x10 Supine shoulder flexion with 3# x10 bilat Supine bridge 2x10 Sidelying shoulder abduction with 3# x10 bilat Supine clamshell with green tband 2x10  Supine marching with green tband x10 Supine serratus punch with 3# x10 bilat   DATE: 03/21/2023 Nustep level 5 x7 min with PT present to discuss status Seated hamstring stretch 2x20 sec bilat Standing hip flexor stretch with one foot on second stair 2x20 sec bilat Quick DASH 13.64 Seated shoulder flexion with 2# 2x10 Seated marching with 2# 2x10    PATIENT EDUCATION: Education details: Issued HEP Person educated: Patient Education method: Explanation, Facilities Manager, and Handouts Education comprehension: verbalized understanding and returned demonstration  HOME EXERCISE PROGRAM: Access Code: [email or web address] URL: [email or web address] Date: 03/26/2023 Prepared by: Jarrell Dazia Lippold  Exercises - Wall Angels  - 1 x daily - 7 x weekly - 2 sets - 10 reps - Standing Shoulder Row with Anchored Resistance  - 1 x daily - 7 x weekly - 2 sets - 10 reps - Shoulder extension with resistance - Neutral  - 1 x daily - 7 x weekly - 2 sets - 10 reps - Side Lunge Adductor Stretch  - 1 x daily - 7 x weekly - 1 sets - 3 reps - 30-60 sec hold - Hip Flexor Stretch with Chair  - 1 x daily - 7 x weekly - 2 reps - 20 sec hold - Seated Hamstring Stretch  - 1 x daily - 7 x weekly - 2 reps - 20 sec hold - Supine Bridge  - 1 x daily - 7 x weekly - 2 sets - 10 reps - Hooklying Clamshell with Resistance  - 1 x daily - 7 x weekly - 2 sets - 10 reps - Supine March with Resistance Band  - 1 x daily - 7 x weekly - 1-2 sets - 10 reps - Supine Shoulder Flexion Extension Full Range AROM  - 1 x daily - 7 x weekly - 2 sets - 10 reps - Supine Single Arm Scapular Protraction  - 1 x daily - 7 x weekly - 3 sets - 10 reps - Sidelying Shoulder Abduction Full Range of Motion  - 1 x daily - 7 x weekly - 1-2 sets - 10 reps - Psoas  Mobilization with Small Ball  - 1 x daily - 7 x weekly - 1 sets - 1 reps - 30-60 sec hold  ASSESSMENT:  CLINICAL IMPRESSION:  Ms Nowakowski presents to skilled PT reporting that she thought that the 3# weight was too much last night.  Able to progress through exercises during session, but changed to 1# weight with additional sets and patient reports an improved tolerance.  Patient continues to progress with LE strengthening exercises and able to add in straight leg raise exercise today.  Patient with more difficulty on her left leg than right with the new straight leg raise.  Patient will have one additional aquatics PT session next week and she is going to meet with [employer] about potentially joining the gym.  Patient is on track for discharge by end of cert period prior to her trip to [place] .   OBJECTIVE IMPAIRMENTS: decreased activity tolerance, decreased balance, difficulty walking, decreased ROM, decreased strength, impaired perceived functional ability, increased muscle spasms, impaired flexibility, postural dysfunction, and pain.   ACTIVITY LIMITATIONS: carrying, lifting, standing, squatting, sleeping, and stairs  PARTICIPATION LIMITATIONS: cleaning, driving, shopping, and community activity  PERSONAL FACTORS: Past/current experiences, Time since onset of injury/illness/exacerbation, and 3+ comorbidities: breast cancer s/p lumpectomy with 2 sentinel lymph nodes removed, DVTs, Reynauds  are also affecting patient's functional outcome.   REHAB POTENTIAL: Good  CLINICAL DECISION MAKING: Evolving/moderate complexity  EVALUATION COMPLEXITY: Moderate   GOALS: Goals reviewed with patient? Yes  SHORT TERM GOALS: Target date: 02/07/2023  Patient will be independent with initial HEP. Baseline: Goal status: Met on 02/06/2023  2.  Patient will report at least a 25% improvement in symptoms since starting PT. Baseline:  Goal status: Met on 02/17/2023   LONG TERM GOALS: Target date:  04/18/2023  Patient will be independent with advanced HEP. Baseline:  Goal status: Ongoing  2.  Patient will improve Quick DASH to no greater than 15 to demonstrate improvements functional tasks. Baseline: 27.27 Goal status: Met on 03/21/2023  3.  Patient will increase her bilateral UE/LE strength to [facility] to allow her to perform all desired tasks, such as practicing her swing for Pickleball. Baseline:  Goal status: Met on 03/21/2023  4.  Patient will report a decrease in pain to allow her to return to [employer] classes without increased pain. Baseline:  Goal status: Ongoing  5.  Patient will be able to ambulate for at least 20 minutes without increased pain to allow her to have community ambulation. Baseline: 3 minute walk test:  587 ft with RPE of 5/10 and left hip flexor pain of 6/10 Goal status: Ongoing (see above)    PLAN:  PT FREQUENCY: 1-2x/week  PT DURATION: 4 weeks  PLANNED INTERVENTIONS: 97164- PT Re-evaluation, 97110-Therapeutic exercises, 97530- Therapeutic activity, 97112- Neuromuscular re-education, 97535- Self Care, 02859- Manual therapy, [id number]- Gait training, [phone]- Aquatic Therapy, 97014- Electrical stimulation (unattended), [phone]- Electrical stimulation (manual), 97016- Vasopneumatic device, [id number]- Ultrasound, [id number]- Traction (mechanical), [id number]- Ionotophoresis 4mg /ml Dexamethasone , Patient/Family education, Balance training, Stair training, Taping, Dry Needling, Joint mobilization, Joint manipulation, Spinal manipulation, Spinal mobilization, Scar mobilization, Cryotherapy, and Moist heat  PLAN FOR NEXT SESSION: Assess and progress HEP as indicated, strengthening, flexibility, core stability, aquatics   [place], PT, DPT 04/01/23, 12:32 PM  [facility] [address] [place], [place] [address] Phone # [phone] Fax [phone]

## 2023-04-10 NOTE — Therapy (Signed)
OUTPATIENT PHYSICAL THERAPY TREATMENT NOTE   Patient Name: Patricia Marshall MRN: [id number] DOB:Sep 21, 1945, 78 y.o., female Today's Date: 04/11/2023    END OF SESSION:  PT End of Session - 04/11/23 1557     Visit Number 10    Date for PT Re-Evaluation 04/18/23    Authorization Type [employer] Medicare    Progress Note Due on Visit 17    PT Start Time 1345    PT Stop Time 1430    PT Time Calculation (min) 45 min    Activity Tolerance Patient tolerated treatment well    Behavior During Therapy WFL for tasks assessed/performed                 Past Medical History:  Diagnosis Date   Arthritis    Breast cancer (HCC)    Chicken pox    High cholesterol    Personal history of chemotherapy    Personal history of radiation therapy    PONV (postoperative nausea and vomiting)    Vitamin D deficiency    Past Surgical History:  Procedure Laterality Date   APPENDECTOMY  1959   BACK SURGERY     1996   BREAST LUMPECTOMY WITH AXILLARY LYMPH NODE BIOPSY Left 11/15/2021   Procedure: LEFT BREAST LUMPECTOMY WITH AXILLARY SENTINEL LYMPH NODE BIOPSY;  Surgeon: Emelia Loron, MD;  Location: [facility];  Service: General;  Laterality: Left;   PORTACATH PLACEMENT Right 11/15/2021   Procedure: INSERTION PORT-A-CATH;  Surgeon: Emelia Loron, MD;  Location: [facility];  Service: General;  Laterality: Right;   TONSILLECTOMY AND ADENOIDECTOMY  1954   Patient Active Problem List   Diagnosis Date Noted   Acute deep vein thrombosis (DVT) of left lower extremity (HCC) 04/23/2022   Port-A-Cath in place 02/04/2022   Genetic testing 12/07/2021   Malignant neoplasm of upper-inner quadrant of left breast in female, estrogen receptor positive (HCC) 10/19/2021   Piriformis syndrome of left side 07/12/2015   Degenerative arthritis of left knee 07/12/2015   BEE STING REACTION, LOCAL 10/24/2006    PCP: [name], NP  REFERRING PROVIDER: [name],  NP  REFERRING DIAG: M25.50 (ICD-10-CM) - Multiple joint pain  THERAPY DIAG:  Muscle weakness (generalized)  Cramp and spasm  Abnormal posture  Rationale for Evaluation and Treatment: Rehabilitation  ONSET DATE: Since 2023, but has been getting worse since September 2024  SUBJECTIVE:                                                                                                                                                                                      SUBJECTIVE STATEMENT: I joined the pool at [employer]  and have gone a few times. I really like the water. My hips are feeling much better already.  Hand dominance: Right  PERTINENT HISTORY: Recent DVTs (cleared by Dr Al Pimple that she can participate in PT) Patient was diagnosed on 09/20/2021 with left grade 2 invasive ductal carcinoma breast cancer. She underwent a left lumpectomy and sentinel node biopsy (2 negative nodes) on 11/15/2021. 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 Yes NPRS scale: 3/10 Pain location: hip flexor and right ring finger PAIN TYPE: aching and sharp Pain description: intermittent  Aggravating factors: increased activity, and sometimes worse at night Relieving factors: uncertain   PRECAUTIONS: Other: L UE lymphedema risk, DVT (11/19/21)  RED FLAGS: None   WEIGHT BEARING RESTRICTIONS: No  FALLS:  Has patient fallen in last 6 months? No  LIVING ENVIRONMENT: Lives with: lives with their spouse Lives in: House/apartment Stairs:  about 14 (has 2 sets)  steps; on left going up and External: 3 steps; none Has following equipment at home: None  OCCUPATION: Retired  PLOF: Independent and Leisure: Pickleball, Geophysicist/field seismologist, formerly enjoyed Psychologist, educational and yoga  PATIENT GOALS:To be able to get back to a more normal state with less pain.  NEXT MD VISIT:  SOZO screen on 02/23/1013, Dr Al Pimple on  02/25/23  OBJECTIVE:  Note: Objective measures were completed at Evaluation unless otherwise noted.  DIAGNOSTIC FINDINGS:  Bone Density on 05/20/2022: The BMD measured at Femur Total Right is 0.715 g/cm2 with a T-score of -2.3.  PATIENT SURVEYS:  Eval:  Quick Sharilyn Sites 27.27  03/21/2023:  Quick Dash 13.64  COGNITION: Overall cognitive status: Within functional limits for tasks assessed     SENSATION: Denies numbness and tingling down arm  POSTURE: Slight forward rounded shoulder and forward head  UPPER EXTREMITY ROM:   01/27/2023: Right and Left shoulder flexion 140 degrees Right shoulder abduction 137 degrees Left shoulder abduction 125 degrees  UPPER EXTREMITY MMT:  01/27/2023: Right shoulder strength is WFL Left shoulder strength for flexion and abduction is WFL Left shoulder IR/ER is 4+/5 Grip strength:  right 52 lbs, left 28 lbs  03/21/2023: Grip strength:  right 60 lbs, left 40 lbs  LOWER EXTREMITY MMT:  01/27/2023: Right LE is WFL Left hip strength is grossly 4/5  FUNCTIONAL ASSESSMENT:  01/27/2023: 5 times sit to stand:  8.55 sec without UE support 3 minute walk test:  587 ft with RPE of 5/10 and left hip flexor pain of 6/10  03/21/2023: 3 minute walk test:  608 ft with RPE of 2/10 and left hip flexor pain of 4/10   TODAY'S TREATMENT:    04/11/23:Pt arrives for aquatic physical therapy. Treatment took place in 3.5-5.5 feet of water. Water temperature was 92 degrees F.. Pt entered the pool via stairs independently. Pt requires buoyancy of water for support and to offload joints with strengthening exercises.  Pt utilizes viscosity of the water required for strengthening. Seated water bench with 75% submersion Pt performed seated LE AROM exercises 20x in all planes, concurrent review of her status and todays plan. We reviewed pt's entire aquatic plan including: water walking, hip kicks 3 ways, and underwater bicycle with large noodle. Pt demonstrated all exercises  correctly and had an opportunity to ask all questions. We attempted bicycel on horseback but pt could not control her trunk and always fell forward.  DATE: 04/01/2023 Nustep level 5 x6  min with PT present to discuss status Seated hamstring stretch 2x20 sec bilat Seated piriformis stretch 2x20 sec bilat Supine shoulder flexion with 1# 2x10 bilat Supine serratus punch with 1# 2x10 bilat Supine marching with yellow loop  2x10 Supine clamshell with green tband 2x10 Supine bridge 2x10 Sidelying shoulder abduction with 1# x10 bilat Supine straight leg raise 2x10 bilat Supine lower trunk rotation x10 bilat   DATE: 03/26/2023 Nustep level 5 x7 min with PT present to discuss status Seated hamstring stretch 2x20 sec bilat Standing hip flexor stretch with one foot on second stair 2x20 sec bilat Seated shoulder flexion with 3# 2x10 Supine shoulder flexion with 3# x10 bilat Supine bridge 2x10 Sidelying shoulder abduction with 3# x10 bilat Supine clamshell with green tband 2x10 Supine marching with green tband x10 Supine serratus punch with 3# x10 bilat     PATIENT EDUCATION: Education details: Issued HEP Person educated: Patient Education method: Explanation, Facilities manager, and Handouts Education comprehension: verbalized understanding and returned demonstration  HOME EXERCISE PROGRAM: Access Code: [email or web address] URL: [email or web address] Date: 03/26/2023 Prepared by: Clydie Braun Menke  Exercises - Wall Angels  - 1 x daily - 7 x weekly - 2 sets - 10 reps - Standing Shoulder Row with Anchored Resistance  - 1 x daily - 7 x weekly - 2 sets - 10 reps - Shoulder extension with resistance - Neutral  - 1 x daily - 7 x weekly - 2 sets - 10 reps - Side Lunge Adductor Stretch  - 1 x daily - 7 x weekly - 1 sets - 3 reps - 30-60 sec hold - Hip Flexor Stretch with Chair  - 1 x daily - 7 x weekly - 2 reps - 20 sec hold - Seated Hamstring Stretch  - 1 x daily - 7 x weekly - 2 reps - 20 sec  hold - Supine Bridge  - 1 x daily - 7 x weekly - 2 sets - 10 reps - Hooklying Clamshell with Resistance  - 1 x daily - 7 x weekly - 2 sets - 10 reps - Supine March with Resistance Band  - 1 x daily - 7 x weekly - 1-2 sets - 10 reps - Supine Shoulder Flexion Extension Full Range AROM  - 1 x daily - 7 x weekly - 2 sets - 10 reps - Supine Single Arm Scapular Protraction  - 1 x daily - 7 x weekly - 3 sets - 10 reps - Sidelying Shoulder Abduction Full Range of Motion  - 1 x daily - 7 x weekly - 1-2 sets - 10 reps - Psoas Mobilization with Small Ball  - 1 x daily - 7 x weekly - 1 sets - 1 reps - 30-60 sec hold  ASSESSMENT:  CLINICAL IMPRESSION: Pt returns to aquatic PT after a few weeks off. She has joined [employer] and has already been going to the pool. We reviewed her current exercises which are appropriate for her current level. Pt is independent enough to continue on her own.  OBJECTIVE IMPAIRMENTS: decreased activity tolerance, decreased balance, difficulty walking, decreased ROM, decreased strength, impaired perceived functional ability, increased muscle spasms, impaired flexibility, postural dysfunction, and pain.   ACTIVITY LIMITATIONS: carrying, lifting, standing, squatting, sleeping, and stairs  PARTICIPATION LIMITATIONS: cleaning, driving, shopping, and community activity  PERSONAL FACTORS: Past/current experiences, Time since onset of injury/illness/exacerbation, and 3+ comorbidities: breast cancer s/p lumpectomy with 2 sentinel lymph nodes removed, DVTs, Reynauds  are also affecting patient's functional outcome.   REHAB POTENTIAL: Good  CLINICAL DECISION MAKING: Evolving/moderate complexity  EVALUATION COMPLEXITY: Moderate   GOALS: Goals reviewed with patient? Yes  SHORT TERM GOALS: Target date: 02/07/2023  Patient will be independent with initial HEP. Baseline: Goal status: Met on 02/06/2023  2.  Patient will report at least a 25% improvement in symptoms since starting  PT. Baseline:  Goal status: Met on 02/17/2023   LONG TERM GOALS: Target date: 04/18/2023  Patient will be independent with advanced HEP. Baseline:  Goal status: Ongoing  2.  Patient will improve Quick DASH to no greater than 15 to demonstrate improvements functional tasks. Baseline: 27.27 Goal status: Met on 03/21/2023  3.  Patient will increase her bilateral UE/LE strength to [facility] to allow her to perform all desired tasks, such as practicing her swing for Pickleball. Baseline:  Goal status: Met on 03/21/2023  4.  Patient will report a decrease in pain to allow her to return to [employer] classes without increased pain. Baseline:  Goal status: Ongoing  5.  Patient will be able to ambulate for at least 20 minutes without increased pain to allow her to have community ambulation. Baseline: 3 minute walk test:  587 ft with RPE of 5/10 and left hip flexor pain of 6/10 Goal status: Ongoing (see above)    PLAN:  PT FREQUENCY: 1-2x/week  PT DURATION: 4 weeks  PLANNED INTERVENTIONS: 97164- PT Re-evaluation, 97110-Therapeutic exercises, 97530- Therapeutic activity, 97112- Neuromuscular re-education, 97535- Self Care, 62130- Manual therapy, [id number]- Gait training, [phone]- Aquatic Therapy, 97014- Electrical stimulation (unattended), [phone]- Electrical stimulation (manual), 97016- Vasopneumatic device, [id number]- Ultrasound, [id number]- Traction (mechanical), [id number]- Ionotophoresis 4mg /ml Dexamethasone, Patient/Family education, Balance training, Stair training, Taping, Dry Needling, Joint mobilization, Joint manipulation, Spinal manipulation, Spinal mobilization, Scar mobilization, Cryotherapy, and Moist heat  PLAN FOR NEXT SESSION: Assess and progress HEP as indicated, strengthening, flexibility, core stability, aquatics   Ane Payment, PTA 04/11/23 3:59 PM   [facility] [address] [place], [place] [address] Phone # [phone] Fax [phone]

## 2023-04-11 ENCOUNTER — Ambulatory Visit: Payer: Medicare Other | Admitting: Physical Therapy

## 2023-04-11 ENCOUNTER — Encounter: Payer: Self-pay | Admitting: Physical Therapy

## 2023-04-11 DIAGNOSIS — R252 Cramp and spasm: Secondary | ICD-10-CM

## 2023-04-11 DIAGNOSIS — M6281 Muscle weakness (generalized): Secondary | ICD-10-CM | POA: Diagnosis not present

## 2023-04-11 DIAGNOSIS — R293 Abnormal posture: Secondary | ICD-10-CM | POA: Diagnosis not present

## 2023-04-11 DIAGNOSIS — Z170 Estrogen receptor positive status [ER+]: Secondary | ICD-10-CM | POA: Diagnosis not present

## 2023-04-11 DIAGNOSIS — C50212 Malignant neoplasm of upper-inner quadrant of left female breast: Secondary | ICD-10-CM | POA: Diagnosis not present

## 2023-04-11 DIAGNOSIS — Z483 Aftercare following surgery for neoplasm: Secondary | ICD-10-CM | POA: Diagnosis not present

## 2023-04-15 ENCOUNTER — Ambulatory Visit: Payer: Medicare Other | Admitting: Rehabilitative and Restorative Service Providers"

## 2023-04-15 ENCOUNTER — Encounter: Payer: Self-pay | Admitting: Rehabilitative and Restorative Service Providers"

## 2023-04-15 DIAGNOSIS — R252 Cramp and spasm: Secondary | ICD-10-CM | POA: Diagnosis not present

## 2023-04-15 DIAGNOSIS — C50212 Malignant neoplasm of upper-inner quadrant of left female breast: Secondary | ICD-10-CM | POA: Diagnosis not present

## 2023-04-15 DIAGNOSIS — Z483 Aftercare following surgery for neoplasm: Secondary | ICD-10-CM | POA: Diagnosis not present

## 2023-04-15 DIAGNOSIS — M6281 Muscle weakness (generalized): Secondary | ICD-10-CM

## 2023-04-15 DIAGNOSIS — R293 Abnormal posture: Secondary | ICD-10-CM

## 2023-04-15 DIAGNOSIS — Z170 Estrogen receptor positive status [ER+]: Secondary | ICD-10-CM | POA: Diagnosis not present

## 2023-04-15 NOTE — Therapy (Signed)
OUTPATIENT PHYSICAL THERAPY TREATMENT NOTE AND DISCHARGE SUMMARY   Patient Name: Patricia Marshall MRN: [id number] DOB:November 09, 1945, 78 y.o., female Today's Date: 04/15/2023    END OF SESSION:  PT End of Session - 04/15/23 1359     Visit Number 11    Date for PT Re-Evaluation 04/18/23    Authorization Type [employer]    PT Start Time 1358    PT Stop Time 1440    PT Time Calculation (min) 42 min    Activity Tolerance Patient tolerated treatment well    Behavior During Therapy WFL for tasks assessed/performed                 Past Medical History:  Diagnosis Date   Arthritis    Breast cancer (HCC)    Chicken pox    High cholesterol    Personal history of chemotherapy    Personal history of radiation therapy    PONV (postoperative nausea and vomiting)    Vitamin D deficiency    Past Surgical History:  Procedure Laterality Date   APPENDECTOMY  1959   BACK SURGERY     1996   BREAST LUMPECTOMY WITH AXILLARY LYMPH NODE BIOPSY Left 11/15/2021   Procedure: LEFT BREAST LUMPECTOMY WITH AXILLARY SENTINEL LYMPH NODE BIOPSY;  Surgeon: Emelia Loron, MD;  Location: [facility];  Service: General;  Laterality: Left;   PORTACATH PLACEMENT Right 11/15/2021   Procedure: INSERTION PORT-A-CATH;  Surgeon: Emelia Loron, MD;  Location: [facility];  Service: General;  Laterality: Right;   TONSILLECTOMY AND ADENOIDECTOMY  1954   Patient Active Problem List   Diagnosis Date Noted   Acute deep vein thrombosis (DVT) of left lower extremity (HCC) 04/23/2022   Port-A-Cath in place 02/04/2022   Genetic testing 12/07/2021   Malignant neoplasm of upper-inner quadrant of left breast in female, estrogen receptor positive (HCC) 10/19/2021   Piriformis syndrome of left side 07/12/2015   Degenerative arthritis of left knee 07/12/2015   BEE STING REACTION, LOCAL 10/24/2006    PCP: [name], NP  REFERRING PROVIDER: [name],  NP  REFERRING DIAG: M25.50 (ICD-10-CM) - Multiple joint pain  THERAPY DIAG:  Muscle weakness (generalized)  Cramp and spasm  Abnormal posture  Rationale for Evaluation and Treatment: Rehabilitation  ONSET DATE: Since 2023, but has been getting worse since September 2024  SUBJECTIVE:                                                                                                                                                                                      SUBJECTIVE STATEMENT: Patient states that she has been taking advantage of the [facility] pool.  Patient denies any pain and states that she is feeling better.  Hand dominance: Right  PERTINENT HISTORY: Recent DVTs (cleared by Dr Al Pimple that she can participate in PT) Patient was diagnosed on 09/20/2021 with left grade 2 invasive ductal carcinoma breast cancer. She underwent a left lumpectomy and sentinel node biopsy (2 negative nodes) on 11/15/2021. 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 No NPRS scale: 0/10 Pain location: hip flexor PAIN TYPE: aching and sharp Pain description: intermittent  Aggravating factors: increased activity, and sometimes worse at night Relieving factors: uncertain   PRECAUTIONS: Other: L UE lymphedema risk, DVT (11/19/21)  RED FLAGS: None   WEIGHT BEARING RESTRICTIONS: No  FALLS:  Has patient fallen in last 6 months? No  LIVING ENVIRONMENT: Lives with: lives with their spouse Lives in: House/apartment Stairs:  about 14 (has 2 sets)  steps; on left going up and External: 3 steps; none Has following equipment at home: None  OCCUPATION: Retired  PLOF: Independent and Leisure: Pickleball, Geophysicist/field seismologist, formerly enjoyed Psychologist, educational and yoga  PATIENT GOALS:To be able to get back to a more normal state with less pain.  NEXT MD VISIT:  SOZO screen on 02/23/1013, Dr Al Pimple on  02/25/23  OBJECTIVE:  Note: Objective measures were completed at Evaluation unless otherwise noted.  DIAGNOSTIC FINDINGS:  Bone Density on 05/20/2022: The BMD measured at [facility] Right is 0.715 g/cm2 with a T-score of -2.3.  PATIENT SURVEYS:  Eval:  Quick Sharilyn Sites 27.27  03/21/2023:  Quick Dash 13.64  COGNITION: Overall cognitive status: Within functional limits for tasks assessed     SENSATION: Denies numbness and tingling down arm  POSTURE: Slight forward rounded shoulder and forward head  UPPER EXTREMITY ROM:   01/27/2023: Right and Left shoulder flexion 140 degrees Right shoulder abduction 137 degrees Left shoulder abduction 125 degrees  UPPER EXTREMITY MMT:  01/27/2023: Right shoulder strength is WFL Left shoulder strength for flexion and abduction is WFL Left shoulder IR/ER is 4+/5 Grip strength:  right 52 lbs, left 28 lbs  03/21/2023: Grip strength:  right 60 lbs, left 40 lbs  LOWER EXTREMITY MMT:  01/27/2023: Right LE is WFL Left hip strength is grossly 4/5  FUNCTIONAL ASSESSMENT:  01/27/2023: 5 times sit to stand:  8.55 sec without UE support 3 minute walk test:  587 ft with RPE of 5/10 and left hip flexor pain of 6/10  03/21/2023: 3 minute walk test:  608 ft with RPE of 2/10 and left hip flexor pain of 4/10   TODAY'S TREATMENT:    DATE: 04/15/2023 Nustep level 5 x6 min with PT present to discuss status Seated hamstring stretch 2x20 sec bilat Seated piriformis stretch 2x20 sec bilat Supine shoulder flexion with 1# 2x10 bilat Supine serratus punch with 1# 2x10 bilat Supine bridge 2x10 Supine clamshell with yellow tband 2x10 Supine marching with yellow loop 2x10 Supine straight leg raise 2x10 bilat Sidelying shoulder abduction with 1# x10 bilat Supine lower trunk rotation x10 bilat Leg press 80#, seat at level 7 2x10  Lat pull down 25#, 2x10   04/11/23: Pt arrives for aquatic physical therapy. Treatment took place in 3.5-5.5 feet of water. Water  temperature was 92 degrees F.. Pt entered the pool via stairs independently. Pt requires buoyancy of water for support and to offload joints with strengthening exercises.  Pt utilizes viscosity of the water required for strengthening.  Seated water bench with 75% submersion Pt performed seated LE AROM exercises 20x in all planes, concurrent review of her status and todays plan. We reviewed pt's entire aquatic plan including: water walking, hip kicks 3 ways, and underwater bicycle with large noodle. Pt demonstrated all exercises correctly and had an opportunity to ask all questions. We attempted bicycel on horseback but pt could not control her trunk and always fell forward.  DATE: 04/01/2023 Nustep level 5 x6 min with PT present to discuss status Seated hamstring stretch 2x20 sec bilat Seated piriformis stretch 2x20 sec bilat Supine shoulder flexion with 1# 2x10 bilat Supine serratus punch with 1# 2x10 bilat Supine marching with yellow loop  2x10 Supine clamshell with green tband 2x10 Supine bridge 2x10 Sidelying shoulder abduction with 1# x10 bilat Supine straight leg raise 2x10 bilat Supine lower trunk rotation x10 bilat   PATIENT EDUCATION: Education details: Issued HEP Person educated: Patient Education method: Explanation, Facilities manager, and Handouts Education comprehension: verbalized understanding and returned demonstration  HOME EXERCISE PROGRAM: Access Code: [email or web address] URL: [email or web address] Date: 03/26/2023 Prepared by: Clydie Braun Brason Berthelot  Exercises - Wall Angels  - 1 x daily - 7 x weekly - 2 sets - 10 reps - Standing Shoulder Row with Anchored Resistance  - 1 x daily - 7 x weekly - 2 sets - 10 reps - Shoulder extension with resistance - Neutral  - 1 x daily - 7 x weekly - 2 sets - 10 reps - Side Lunge Adductor Stretch  - 1 x daily - 7 x weekly - 1 sets - 3 reps - 30-60 sec hold - Hip Flexor Stretch with Chair  - 1 x daily - 7 x weekly - 2 reps - 20 sec hold -  Seated Hamstring Stretch  - 1 x daily - 7 x weekly - 2 reps - 20 sec hold - Supine Bridge  - 1 x daily - 7 x weekly - 2 sets - 10 reps - Hooklying Clamshell with Resistance  - 1 x daily - 7 x weekly - 2 sets - 10 reps - Supine March with Resistance Band  - 1 x daily - 7 x weekly - 1-2 sets - 10 reps - Supine Shoulder Flexion Extension Full Range AROM  - 1 x daily - 7 x weekly - 2 sets - 10 reps - Supine Single Arm Scapular Protraction  - 1 x daily - 7 x weekly - 3 sets - 10 reps - Sidelying Shoulder Abduction Full Range of Motion  - 1 x daily - 7 x weekly - 1-2 sets - 10 reps - Psoas Mobilization with Small Ball  - 1 x daily - 7 x weekly - 1 sets - 1 reps - 30-60 sec hold  ASSESSMENT:  CLINICAL IMPRESSION:  Ms Tarbell presents to skilled PT for her discharge visit. All exercises reviewed today with minimal verbal/visual/tactile cueing required. Patient feels comfortable continuing HEP independently/at [facility]. Pt able to incorporate use of some weight machines in clinic today, educated about safe use and asking for assistance, if needed.  Patient verbalizes understanding not to increase weights too quickly.  She has met all short term and long term goals and is appropriate for discharge from PT at this time. Patient to go to [place] for several weeks and will continue HEP while out of town.  Patient hoping to resume pickle ball in coming weeks.  OBJECTIVE IMPAIRMENTS: decreased activity tolerance, decreased balance, difficulty walking, decreased ROM, decreased strength, impaired perceived functional ability, increased muscle spasms,  impaired flexibility, postural dysfunction, and pain.   ACTIVITY LIMITATIONS: carrying, lifting, standing, squatting, sleeping, and stairs  PARTICIPATION LIMITATIONS: cleaning, driving, shopping, and community activity  PERSONAL FACTORS: Past/current experiences, Time since onset of injury/illness/exacerbation, and 3+ comorbidities: breast cancer s/p lumpectomy with  2 sentinel lymph nodes removed, DVTs, Reynauds  are also affecting patient's functional outcome.   REHAB POTENTIAL: Good  CLINICAL DECISION MAKING: Evolving/moderate complexity  EVALUATION COMPLEXITY: Moderate   GOALS: Goals reviewed with patient? Yes  SHORT TERM GOALS: Target date: 02/07/2023  Patient will be independent with initial HEP. Baseline: Goal status: Met on 02/06/2023  2.  Patient will report at least a 25% improvement in symptoms since starting PT. Baseline:  Goal status: Met on 02/17/2023   LONG TERM GOALS: Target date: 04/18/2023  Patient will be independent with advanced HEP. Baseline:  Goal status: Met on 04/15/2023  2.  Patient will improve Quick DASH to no greater than 15 to demonstrate improvements functional tasks. Baseline: 27.27 Goal status: Met on 03/21/2023  3.  Patient will increase her bilateral UE/LE strength to [facility] to allow her to perform all desired tasks, such as practicing her swing for Pickleball. Baseline:  Goal status: Met on 03/21/2023  4.  Patient will report a decrease in pain to allow her to return to [employer] classes without increased pain. Baseline:  Goal status: Met on 04/15/2023  5.  Patient will be able to ambulate for at least 20 minutes without increased pain to allow her to have community ambulation. Baseline: 3 minute walk test:  587 ft with RPE of 5/10 and left hip flexor pain of 6/10 Goal status: Met on 04/15/2023    PLAN:  PT FREQUENCY: 1-2x/week  PT DURATION: 4 weeks  PLANNED INTERVENTIONS: 97164- PT Re-evaluation, 97110-Therapeutic exercises, 97530- Therapeutic activity, 97112- Neuromuscular re-education, 97535- Self Care, 57846- Manual therapy, [id number]- Gait training, [phone]- Aquatic Therapy, 97014- Electrical stimulation (unattended), [id number]- Electrical stimulation (manual), 97016- Vasopneumatic device, [id number]- Ultrasound, [id number]- Traction (mechanical), [id number]- Ionotophoresis 4mg /ml Dexamethasone, Patient/Family  education, Balance training, Stair training, Taping, Dry Needling, Joint mobilization, Joint manipulation, Spinal manipulation, Spinal mobilization, Scar mobilization, Cryotherapy, and Moist heat  PHYSICAL THERAPY DISCHARGE SUMMARY  Visits from Start of Care: 11 visits total  Current functional level related to goals / functional outcomes: See above   Remaining deficits: N/A   Education / Equipment: Continue HEP and exercise at [employer].   Patient agrees to discharge. Patient goals were met. Patient is being discharged due to meeting the stated rehab goals.    Reather Laurence, PT, DPT 04/15/23, 3:16 PM   [facility] [address], [address] [place], [place] [address] Phone # [phone] Fax [phone]

## 2023-04-16 ENCOUNTER — Other Ambulatory Visit: Payer: Self-pay | Admitting: Hematology and Oncology

## 2023-04-16 ENCOUNTER — Other Ambulatory Visit: Payer: Self-pay | Admitting: *Deleted

## 2023-04-23 ENCOUNTER — Other Ambulatory Visit: Payer: Self-pay | Admitting: Hematology and Oncology

## 2023-05-12 ENCOUNTER — Other Ambulatory Visit: Payer: Self-pay

## 2023-06-04 ENCOUNTER — Telehealth: Payer: Self-pay | Admitting: *Deleted

## 2023-06-04 NOTE — Telephone Encounter (Signed)
 Pt called to state she will be having a mole biopsied by her dermatologist on March 24  " and I am on the eliquis - do I need to do anything different ?"  This RN verified with MD- continue eliquis and inform dermatologist so they can apply additional pressure - if she has a compression sock she should bring it with her and wear it post procedure to help hold pressure.  She needs to limit activity on her legs post procedure and elevate often.  Pt verbalized understanding.

## 2023-06-06 ENCOUNTER — Ambulatory Visit (HOSPITAL_COMMUNITY)
Admission: RE | Admit: 2023-06-06 | Discharge: 2023-06-06 | Disposition: A | Discharge status: Home / Self Care | Source: Ambulatory Visit | Attending: Hematology and Oncology | Admitting: Hematology and Oncology

## 2023-06-06 DIAGNOSIS — Z01818 Encounter for other preprocedural examination: Secondary | ICD-10-CM | POA: Diagnosis not present

## 2023-06-06 DIAGNOSIS — Z170 Estrogen receptor positive status [ER+]: Secondary | ICD-10-CM | POA: Diagnosis not present

## 2023-06-06 DIAGNOSIS — C50212 Malignant neoplasm of upper-inner quadrant of left female breast: Secondary | ICD-10-CM | POA: Insufficient documentation

## 2023-06-06 DIAGNOSIS — Z0189 Encounter for other specified special examinations: Secondary | ICD-10-CM

## 2023-06-06 LAB — ECHOCARDIOGRAM COMPLETE
AR max vel: 2.43 cm2
AV Area VTI: 2.28 cm2
AV Area mean vel: 2.25 cm2
AV Mean grad: 2 mmHg
AV Peak grad: 4.9 mmHg
Ao pk vel: 1.11 m/s
Area-P 1/2: 4.31 cm2
Calc EF: 62.6 %
MV VTI: 1.92 cm2
S' Lateral: 2.5 cm
Single Plane A2C EF: 62.5 %
Single Plane A4C EF: 62.2 %

## 2023-06-06 NOTE — Progress Notes (Signed)
  Echocardiogram 2D Echocardiogram has been performed.  Ocie Doyne RDCS 06/06/2023, 9:40 AM

## 2023-06-09 ENCOUNTER — Ambulatory Visit: Payer: Medicare HMO | Attending: General Surgery

## 2023-06-09 ENCOUNTER — Telehealth: Payer: Self-pay

## 2023-06-09 VITALS — Wt 123.5 lb

## 2023-06-09 DIAGNOSIS — Z483 Aftercare following surgery for neoplasm: Secondary | ICD-10-CM | POA: Insufficient documentation

## 2023-06-09 DIAGNOSIS — L821 Other seborrheic keratosis: Secondary | ICD-10-CM | POA: Diagnosis not present

## 2023-06-09 NOTE — Therapy (Signed)
 OUTPATIENT PHYSICAL THERAPY SOZO SCREENING NOTE   Patient Name: Patricia Marshall MRN: [id number] DOB:[birth year], 78 y.o., female Today's Date: 06/09/2023  PCP: [name], NP REFERRING PROVIDER: Emelia Loron, MD   PT End of Session - 06/09/23 1041     Visit Number 11   # unchanged due to screen only   PT Start Time 1038    PT Stop Time 1049    PT Time Calculation (min) 11 min    Activity Tolerance Patient tolerated treatment well    Behavior During Therapy WFL for tasks assessed/performed             Past Medical History:  Diagnosis Date   Arthritis    Breast cancer (HCC)    Chicken pox    High cholesterol    Personal history of chemotherapy    Personal history of radiation therapy    PONV (postoperative nausea and vomiting)    Vitamin D deficiency    Past Surgical History:  Procedure Laterality Date   APPENDECTOMY  1959   BACK SURGERY     1996   BREAST LUMPECTOMY WITH AXILLARY LYMPH NODE BIOPSY Left 11/15/2021   Procedure: LEFT BREAST LUMPECTOMY WITH AXILLARY SENTINEL LYMPH NODE BIOPSY;  Surgeon: Emelia Loron, MD;  Location: [facility];  Service: General;  Laterality: Left;   PORTACATH PLACEMENT Right 11/15/2021   Procedure: INSERTION PORT-A-CATH;  Surgeon: Emelia Loron, MD;  Location: [facility];  Service: General;  Laterality: Right;   TONSILLECTOMY AND ADENOIDECTOMY  1954   Patient Active Problem List   Diagnosis Date Noted   Acute deep vein thrombosis (DVT) of left lower extremity (HCC) 04/23/2022   Port-A-Cath in place 02/04/2022   Genetic testing 12/07/2021   Malignant neoplasm of upper-inner quadrant of left breast in female, estrogen receptor positive (HCC) 10/19/2021   Piriformis syndrome of left side 07/12/2015   Degenerative arthritis of left knee 07/12/2015   BEE STING REACTION, LOCAL 10/24/2006    REFERRING DIAG: left breast cancer at risk for lymphedema  THERAPY DIAG:  Aftercare following  surgery for neoplasm  PERTINENT HISTORY: Recent DVTs (cleared by Dr Al Pimple that she can participate in PT) Patient was diagnosed on 09/20/2021 with left grade 2 invasive ductal carcinoma breast cancer. She underwent a left lumpectomy and sentinel node biopsy (2 negative nodes) on 11/15/2021. 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   PAIN:  Are you having pain? No  SOZO SCREENING: Patient was assessed today using the SOZO machine to determine the lymphedema index score. This was compared to her baseline score. It was determined that she is within the recommended range when compared to her baseline and no further action is needed at this time. She will continue SOZO screenings. These are done every 3 months for 2 years post operatively followed by every 6 months for 2 years, and then annually.   L-DEX FLOWSHEETS - 06/09/23 1000       L-DEX LYMPHEDEMA SCREENING   Measurement Type Unilateral    L-DEX MEASUREMENT EXTREMITY Upper Extremity    POSITION  Standing    DOMINANT SIDE Right    At Risk Side Left    BASELINE SCORE (UNILATERAL) -3    L-DEX SCORE (UNILATERAL) -2.8    VALUE CHANGE (UNILAT) 0.2  Hermenia Bers, PTA 06/09/2023, 10:49 AM

## 2023-06-09 NOTE — Progress Notes (Unsigned)
 [facility] Cancer Follow up:    Patricia Frees, NP [address] [place] [address]   DIAGNOSIS:  Cancer Staging  Malignant neoplasm of upper-inner quadrant of left breast in female, estrogen receptor positive (HCC) Staging form: Breast, AJCC 8th Edition - Pathologic: Stage IIA (pT2, pN0, cM0, G3, ER+, PR-, HER2+) - Signed by Rachel Moulds, MD on 11/26/2021 Histologic grading system: 3 grade system   SUMMARY OF ONCOLOGIC HISTORY: Oncology History  Malignant neoplasm of upper-inner quadrant of left breast in female, estrogen receptor positive (HCC)  09/20/2021 Mammogram   Screening mammogram showed extremely dense breasts and a possible mass in the left breast warranting further evaluation.  Breast density category is D Diagnostic mammogram of the breast showed indeterminate mass in the 11:00 location of the left breast.  Indeterminate mass in the lower anterior left axilla.   10/04/2021 Pathology Results   Pathology showed grade 2 invasive ductal carcinoma, left axillary soft tissue needle core biopsy showed benign breast tissue with patchy changes prognostics from the breast tumor showed ER 90% positive strong staining PR 0%, negative, Ki-67 of 30% and HER2 positive for IHC 3+   10/19/2021 Initial Diagnosis   Malignant neoplasm of upper-inner quadrant of left breast in female, estrogen receptor positive (HCC)   10/23/2021 Cancer Staging   Staging form: Breast, AJCC 8th Edition - Pathologic: Stage IIA (pT2, pN0, cM0, G3, ER+, PR-, HER2+) - Signed by Rachel Moulds, MD on 11/26/2021 Histologic grading system: 3 grade system   11/10/2021 Genetic Testing   Negative hereditary cancer genetic testing: no pathogenic variants detected in [facility] CancerNext-Expanded +RNA Panel.  Report date is November 10, 2021.   The CancerNext-Expanded gene panel offered by [facility] and includes sequencing, rearrangement, and RNA analysis for the following 77 genes: AIP, ALK, APC,  ATM, AXIN2, BAP1, BARD1, BLM, BMPR1A, BRCA1, BRCA2, BRIP1, CDC73, CDH1, CDK4, CDKN1B, CDKN2A, CHEK2, CTNNA1, DICER1, FANCC, FH, FLCN, GALNT12, KIF1B, LZTR1, MAX, MEN1, MET, MLH1, MSH2, MSH3, MSH6, MUTYH, NBN, NF1, NF2, NTHL1, PALB2, PHOX2B, PMS2, POT1, PRKAR1A, PTCH1, PTEN, RAD51C, RAD51D, RB1, RECQL, RET, SDHA, SDHAF2, SDHB, SDHC, SDHD, SMAD4, SMARCA4, SMARCB1, SMARCE1, STK11, SUFU, TMEM127, TP53, TSC1, TSC2, VHL and XRCC2 (sequencing and deletion/duplication); EGFR, EGLN1, HOXB13, KIT, MITF, PDGFRA, POLD1, and POLE (sequencing only); EPCAM and GREM1 (deletion/duplication only).    11/15/2021 Surgery   Left breast lumpectomy: 2.4 cm grade 3 invasive ductal carcinoma, margins -2 sentinel lymph nodes negative.   12/10/2021 -  Chemotherapy   Patient is on Treatment Plan : BREAST Paclitaxel + Trastuzumab q7d / Trastuzumab q21d     03/26/2022 - 04/22/2022 Radiation Therapy   03/26/2022 through 04/22/2022 Site Technique Total Dose (Gy) Dose per Fx (Gy) Completed Fx Beam Energies  Breast, Left: Breast_L 3D 42.56/42.56 2.66 16/16 6X  Breast, Left: Breast_L_Bst 3D 8/8 2 4/4 6X     05/2022 -  Anti-estrogen oral therapy   Anastrozole daily     CURRENT THERAPY: Herceptin/Anastrozole  INTERVAL HISTORY:  Patricia Marshall is a 78 year old female who presents for follow-up on abnormal lab results and medication management. She is accompanied by her husband, Greggory Stallion.  She had blood work done since her last visit in December 2024, which showed abnormal results, specifically an abnormal antiphospholipid antibody panel. The panel needs to be repeated 12 weeks apart to confirm the results. She is concerned about the implications of these results on her current medication regimen, particularly the use of Eliquis for blood clot prevention.  She has  a history of blood clots, with the last occurrence in October 2024, approximately three weeks after the manual removal of her port. She experienced swelling in her ankles  following the procedure, which has since improved. Her ankles no longer feel warm or appear red, and she describes the condition as 'postthrombotic syndrome.'  She experiences hot flashes, muscular pain, and fatigue, which she attributes to her hormone therapy. She also mentions occasional dizziness, particularly when moving quickly or changing positions. She engages in physical activities such as pickleball and water therapy, which have helped improve her confidence and physical condition.  She has been taking Lipitor for cholesterol management but experienced significant muscle aches and pains when taking 20 mg daily. Following a discussion with her provider's assistant, her dosage was reduced to 20 mg once a week, which she finds unusual. She has reached out to her provider for clarification on this regimen.  She reports occasional neuropathy, particularly when performing tasks that require hand strength, such as opening jars. The neuropathy has improved since her infusion treatments.  Her vision has not improved significantly, and she has had her lenses adjusted by her optometrist. She experiences difficulty seeing at night while driving and has been advised that her prescription may be too strong.  She and her husband have stopped drinking alcohol regularly, though they occasionally have a glass of wine or a mixed drink. They recently spent 30 days in [place], where she engaged in physical activities like pickleball. She has started a fitness program at a local gym, which includes water therapy and exercises to improve her muscle strength.  Rest of the pertinent 10 point ROS reviewed and neg.   Patient Active Problem List   Diagnosis Date Noted   Acute deep vein thrombosis (DVT) of left lower extremity (HCC) 04/23/2022   Port-A-Cath in place 02/04/2022   Genetic testing 12/07/2021   Malignant neoplasm of upper-inner quadrant of left breast in female, estrogen receptor positive (HCC)  10/19/2021   Piriformis syndrome of left side 07/12/2015   Degenerative arthritis of left knee 07/12/2015   BEE STING REACTION, LOCAL 10/24/2006    is allergic to other, ampicillin, and anesthesia s-i-[id number] [propofol].  MEDICAL HISTORY: Past Medical History:  Diagnosis Date   Arthritis    Breast cancer (HCC)    Chicken pox    High cholesterol    Personal history of chemotherapy    Personal history of radiation therapy    PONV (postoperative nausea and vomiting)    Vitamin D deficiency     SURGICAL HISTORY: Past Surgical History:  Procedure Laterality Date   APPENDECTOMY  1959   BACK SURGERY     1996   BREAST LUMPECTOMY WITH AXILLARY LYMPH NODE BIOPSY Left 11/15/2021   Procedure: LEFT BREAST LUMPECTOMY WITH AXILLARY SENTINEL LYMPH NODE BIOPSY;  Surgeon: Emelia Loron, MD;  Location: [facility];  Service: General;  Laterality: Left;   PORTACATH PLACEMENT Right 11/15/2021   Procedure: INSERTION PORT-A-CATH;  Surgeon: Emelia Loron, MD;  Location: [facility];  Service: General;  Laterality: Right;   TONSILLECTOMY AND ADENOIDECTOMY  1954    SOCIAL HISTORY: Social History   Socioeconomic History   Marital status: Married    Spouse name: Not on file   Number of children: Not on file   Years of education: Not on file   Highest education level: Not on file  Occupational History   Not on file  Tobacco Use   Smoking status: Never   Smokeless tobacco:  Never  Vaping Use   Vaping status: Never Used  Substance and Sexual Activity   Alcohol use: Yes    Alcohol/week: 1.0 standard drink of alcohol    Types: 1 Glasses of wine per week   Drug use: Never   Sexual activity: Not on file  Other Topics Concern   Not on file  Social History Narrative   Married    Retired    Social Drivers of Corporate investment banker Strain: Low Risk  (09/11/2022)   Overall Financial Resource Strain (CARDIA)    Difficulty of Paying Living Expenses: Not hard  at all  Food Insecurity: No Food Insecurity (09/11/2022)   Hunger Vital Sign    Worried About Running Out of Food in the Last Year: Never true    Ran Out of Food in the Last Year: Never true  Transportation Needs: No Transportation Needs (09/11/2022)   PRAPARE - Administrator, Civil Service (Medical): No    Lack of Transportation (Non-Medical): No  Physical Activity: Insufficiently Active (09/11/2022)   Exercise Vital Sign    Days of Exercise per Week: 3 days    Minutes of Exercise per Session: 30 min  Stress: No Stress Concern Present (09/11/2022)   [employer] of Occupational Health - Occupational Stress Questionnaire    Feeling of Stress : Not at all  Social Connections: Socially Integrated (09/11/2022)   Social Connection and Isolation Panel [NHANES]    Frequency of Communication with Friends and Family: More than three times a week    Frequency of Social Gatherings with Friends and Family: More than three times a week    Attends Religious Services: More than 4 times per year    Active Member of [employer] or Organizations: Yes    Attends Engineer, structural: More than 4 times per year    Marital Status: Married  Catering manager Violence: Not At Risk (09/11/2022)   Humiliation, Afraid, Rape, and Kick questionnaire    Fear of Current or Ex-Partner: No    Emotionally Abused: No    Physically Abused: No    Sexually Abused: No    FAMILY HISTORY: Family History  Problem Relation Age of Onset   Stomach cancer Mother 7   Heart attack Father 88   Cancer Sister        dx mid 59s; unknown primary w/ mets; treated like ovarian cancer   Heart attack Brother 51   Heart attack Brother 4    PHYSICAL EXAMINATION  There were no vitals taken for this visit.  Physical Exam Constitutional:      Appearance: Normal appearance.  Cardiovascular:     Rate and Rhythm: Normal rate and regular rhythm.  Pulmonary:     Effort: Pulmonary effort is normal.     Breath  sounds: Normal breath sounds.  Chest:     Comments: Extremely dense breasts. No def palpable masses. No regional adenopathy Musculoskeletal:     Cervical back: Normal range of motion and neck supple. No rigidity.  Lymphadenopathy:     Cervical: No cervical adenopathy.  Skin:    General: Skin is warm and dry.  Neurological:     General: No focal deficit present.     Mental Status: She is alert.       LABORATORY DATA:  CBC    Component Value Date/Time   WBC 4.5 02/28/2023 1246   RBC 4.44 02/28/2023 1246   HGB 13.0 02/28/2023 1246   HGB 12.1 06/25/2022 0820  HCT 39.5 02/28/2023 1246   HCT 36.8 06/26/2022 1120   PLT 156 02/28/2023 1246   PLT 116 (L) 06/25/2022 0820   MCV 89.0 02/28/2023 1246   MCH 29.3 02/28/2023 1246   MCHC 32.9 02/28/2023 1246   RDW 12.5 02/28/2023 1246   LYMPHSABS 1.2 02/28/2023 1246   MONOABS 0.4 02/28/2023 1246   EOSABS 0.0 02/28/2023 1246   BASOSABS 0.0 02/28/2023 1246    CMP     Component Value Date/Time   NA 138 01/15/2023 0853   K 4.2 01/15/2023 0853   CL 102 01/15/2023 0853   CO2 25 01/15/2023 0853   GLUCOSE 85 01/15/2023 0853   BUN 19 01/15/2023 0853   CREATININE 0.75 01/15/2023 0853   CREATININE 0.67 11/19/2022 0902   CREATININE 0.76 11/03/2019 0921   CALCIUM 10.2 01/15/2023 0853   PROT 7.9 01/15/2023 0853   ALBUMIN 4.7 01/15/2023 0853   AST 22 01/15/2023 0853   AST 16 11/19/2022 0902   ALT 14 01/15/2023 0853   ALT 10 11/19/2022 0902   ALKPHOS 98 01/15/2023 0853   BILITOT 0.8 01/15/2023 0853   BILITOT 0.6 11/19/2022 0902   GFRNONAA >60 11/19/2022 0902   GFRNONAA 78 11/03/2019 0921   GFRAA 91 11/03/2019 0921    ASSESSMENT and THERAPY PLAN:   Antiphospholipid Syndrome (suspected) Abnormal antiphospholipid antibody panel suggests potential autoimmune disorder with thromboembolism risk. Diagnosis requires repeat testing in 12 weeks. Lifelong anticoagulation with warfarin may be necessary if confirmed. - Repeat  antiphospholipid antibody panel in 12 weeks. - Consider switching to warfarin if antiphospholipid syndrome is confirmed.  Breast Cancer Surveillance Extremely dense breasts warrant consideration of alternating MRI and mammogram for surveillance. A new blood test for minimal residual disease is available and non-invasive. She will do some research and keep [place] posted. - Provide information on blood test for minimal residual disease. - Consider MRI for breast cancer surveillance. She is agreeable, ordered.  Hyperlipidemia Lipitor dosage reduced to 20 mg weekly due to myalgia, raising efficacy concerns. Patient awaiting primary care provider's response for dosage clarification. - Await response from primary care provider regarding Lipitor dosage. - Consider fasting lipid panel in the future to assess current cholesterol levels.  Follow-up Regular follow-up is necessary to monitor conditions and adjust treatment plans. - Schedule follow-up appointment in 6 months.  Time spent: 40 min  *Total Encounter Time as defined by the Centers for Medicare and Medicaid Services includes, in addition to the face-to-face time of a patient visit (documented in the note above) non-face-to-face time: obtaining and reviewing outside history, ordering and reviewing medications, tests or procedures, care coordination (communications with other health care professionals or caregivers) and documentation in the medical record.

## 2023-06-09 NOTE — Telephone Encounter (Signed)
 Spoke with patient and confirmed appointment on 3/25

## 2023-06-10 ENCOUNTER — Inpatient Hospital Stay: Payer: Medicare HMO | Attending: Hematology and Oncology | Admitting: Hematology and Oncology

## 2023-06-10 ENCOUNTER — Inpatient Hospital Stay

## 2023-06-10 VITALS — BP 144/66 | HR 62 | Temp 98.3°F | Resp 16 | Wt 123.4 lb

## 2023-06-10 DIAGNOSIS — Z79899 Other long term (current) drug therapy: Secondary | ICD-10-CM | POA: Diagnosis not present

## 2023-06-10 DIAGNOSIS — Z9221 Personal history of antineoplastic chemotherapy: Secondary | ICD-10-CM | POA: Diagnosis not present

## 2023-06-10 DIAGNOSIS — C50212 Malignant neoplasm of upper-inner quadrant of left female breast: Secondary | ICD-10-CM | POA: Diagnosis not present

## 2023-06-10 DIAGNOSIS — Z79811 Long term (current) use of aromatase inhibitors: Secondary | ICD-10-CM | POA: Insufficient documentation

## 2023-06-10 DIAGNOSIS — Z170 Estrogen receptor positive status [ER+]: Secondary | ICD-10-CM

## 2023-06-10 LAB — CBC WITH DIFFERENTIAL/PLATELET
Abs Immature Granulocytes: 0.02 10*3/uL (ref 0.00–0.07)
Basophils Absolute: 0 10*3/uL (ref 0.0–0.1)
Basophils Relative: 1 %
Eosinophils Absolute: 0.1 10*3/uL (ref 0.0–0.5)
Eosinophils Relative: 1 %
HCT: 41.4 % (ref 36.0–46.0)
Hemoglobin: 13.9 g/dL (ref 12.0–15.0)
Immature Granulocytes: 0 %
Lymphocytes Relative: 30 %
Lymphs Abs: 1.7 10*3/uL (ref 0.7–4.0)
MCH: 29.6 pg (ref 26.0–34.0)
MCHC: 33.6 g/dL (ref 30.0–36.0)
MCV: 88.1 fL (ref 80.0–100.0)
Monocytes Absolute: 0.5 10*3/uL (ref 0.1–1.0)
Monocytes Relative: 9 %
Neutro Abs: 3.3 10*3/uL (ref 1.7–7.7)
Neutrophils Relative %: 59 %
Platelets: 182 10*3/uL (ref 150–400)
RBC: 4.7 MIL/uL (ref 3.87–5.11)
RDW: 12.2 % (ref 11.5–15.5)
WBC: 5.6 10*3/uL (ref 4.0–10.5)
nRBC: 0 % (ref 0.0–0.2)

## 2023-06-10 LAB — TSH: TSH: 2.325 u[IU]/mL (ref 0.350–4.500)

## 2023-06-10 NOTE — Telephone Encounter (Signed)
 Called pt to get a better understanding but no answer or vm set up.

## 2023-06-11 ENCOUNTER — Other Ambulatory Visit: Payer: Self-pay

## 2023-06-11 ENCOUNTER — Telehealth: Payer: Self-pay | Admitting: Adult Health

## 2023-06-11 MED ORDER — ATORVASTATIN CALCIUM 20 MG PO TABS: ORAL_TABLET | ORAL | Status: DC

## 2023-06-11 NOTE — Telephone Encounter (Unsigned)
 Copied from CRM [id number]. Topic: General - Call Back - No Documentation >> Jun 11, 2023  3:43 PM Patricia Marshall wrote: Reason for CRM:  Patient stated that she would like  a call back in reference to medication atorvastatin (LIPITOR) 20 MG tablet at the phone # on file

## 2023-06-11 NOTE — Telephone Encounter (Signed)
 Pt notified that she is to take 1 tab a week due to myalgia. Pt prescription was changed to reflect that. Pt also set a f/u visit per lab to retest.

## 2023-06-12 ENCOUNTER — Other Ambulatory Visit: Payer: Self-pay | Admitting: Adult Health

## 2023-06-12 DIAGNOSIS — E782 Mixed hyperlipidemia: Secondary | ICD-10-CM

## 2023-06-13 LAB — LUPUS ANTICOAGULANT PANEL
DRVVT: 80.9 s — ABNORMAL HIGH (ref 0.0–47.0)
PTT Lupus Anticoagulant: 69.5 s — ABNORMAL HIGH (ref 0.0–43.5)

## 2023-06-13 LAB — PROTEIN S ACTIVITY: Protein S Activity: 96 % (ref 63–140)

## 2023-06-13 LAB — HEXAGONAL PHASE PHOSPHOLIPID: Hexagonal Phase Phospholipid: 29 s — ABNORMAL HIGH (ref 0–11)

## 2023-06-13 LAB — DRVVT CONFIRM: dRVVT Confirm: 1.1 ratio (ref 0.8–1.2)

## 2023-06-13 LAB — DRVVT MIX: dRVVT Mix: 54.8 s — ABNORMAL HIGH (ref 0.0–40.4)

## 2023-06-13 LAB — PTT-LA MIX: PTT-LA Mix: 63.5 s — ABNORMAL HIGH (ref 0.0–40.5)

## 2023-06-17 ENCOUNTER — Encounter: Payer: Self-pay | Admitting: *Deleted

## 2023-06-17 ENCOUNTER — Telehealth: Payer: Self-pay | Admitting: Hematology and Oncology

## 2023-06-17 ENCOUNTER — Telehealth: Payer: Self-pay | Admitting: *Deleted

## 2023-06-17 NOTE — Telephone Encounter (Signed)
 Spoke with patient confirmng upcoming appointment

## 2023-06-17 NOTE — Telephone Encounter (Signed)
 This RN called pt  yesterday to discuss need for use of warfarin for positive lab showing antiphospholipid syndrome.  Discussed above with pt have many concerns regarding warfarin " so many of my friends have told me issues with taking this medication"  This RN attempted to explain the diagnosis and why warfarin over other oral anticoagulants is best.  Patricia Marshall stated she would like to "think about this first " - note at present she was at a friend's house and could not stay on her phone longer.  This message will be forwarded to MD for review and further recommendation,.

## 2023-06-18 ENCOUNTER — Other Ambulatory Visit: Payer: Self-pay | Admitting: Hematology and Oncology

## 2023-06-18 ENCOUNTER — Telehealth: Payer: Self-pay | Admitting: Hematology and Oncology

## 2023-06-18 ENCOUNTER — Ambulatory Visit (INDEPENDENT_AMBULATORY_CARE_PROVIDER_SITE_OTHER): Admitting: Adult Health

## 2023-06-18 VITALS — BP 130/88 | HR 49 | Temp 97.9°F | Ht 65.0 in | Wt 123.0 lb

## 2023-06-18 DIAGNOSIS — E782 Mixed hyperlipidemia: Secondary | ICD-10-CM | POA: Diagnosis not present

## 2023-06-18 LAB — HEPATIC FUNCTION PANEL
ALT: 11 U/L (ref 0–35)
AST: 18 U/L (ref 0–37)
Albumin: 4.9 g/dL (ref 3.5–5.2)
Alkaline Phosphatase: 112 U/L (ref 39–117)
Bilirubin, Direct: 0.1 mg/dL (ref 0.0–0.3)
Total Bilirubin: 0.8 mg/dL (ref 0.2–1.2)
Total Protein: 7.9 g/dL (ref 6.0–8.3)

## 2023-06-18 LAB — LIPID PANEL
Cholesterol: 210 mg/dL — ABNORMAL HIGH (ref 0–200)
HDL: 74.3 mg/dL (ref >39.00)
LDL Cholesterol: 120 mg/dL — ABNORMAL HIGH (ref 0–99)
NonHDL: 135.23
Total CHOL/HDL Ratio: 3
Triglycerides: 74 mg/dL (ref 0.0–149.0)
VLDL: 14.8 mg/dL (ref 0.0–40.0)

## 2023-06-18 NOTE — Progress Notes (Signed)
 Subjective:    Patient ID: Patricia Marshall, female    DOB: 1945-07-27, 78 y.o.   MRN: [id number]  HPI 78 year old female who  has a past medical history of Arthritis, Breast cancer (HCC), Chicken pox, High cholesterol, Personal history of chemotherapy, Personal history of radiation therapy, PONV (postoperative nausea and vomiting), and Vitamin D deficiency.  She presents to the office today for follow up regarding hyperlipidemia. She was started on Lipitor 20 mg daily about 4 months ago, soon after starting the medication she had muscle aches. She was advised to take Lipitor 20 mg daily weekly. She reports no side effects since starting lipitor weekly. She would like to recheck her lipid panel today.    Review of Systems See HPI   Past Medical History:  Diagnosis Date   Arthritis    Breast cancer (HCC)    Chicken pox    High cholesterol    Personal history of chemotherapy    Personal history of radiation therapy    PONV (postoperative nausea and vomiting)    Vitamin D deficiency     Social History   Socioeconomic History   Marital status: Married    Spouse name: Not on file   Number of children: Not on file   Years of education: Not on file   Highest education level: Master's degree (e.g., MA, MS, MEng, MEd, MSW, MBA)  Occupational History   Not on file  Tobacco Use   Smoking status: Never   Smokeless tobacco: Never  Vaping Use   Vaping status: Never Used  Substance and Sexual Activity   Alcohol use: Yes    Alcohol/week: 1.0 standard drink of alcohol    Types: 1 Glasses of wine per week   Drug use: Never   Sexual activity: Not on file  Other Topics Concern   Not on file  Social History Narrative   Married    Retired    Social Drivers of Corporate investment banker Strain: Low Risk  (06/17/2023)   Overall Financial Resource Strain (CARDIA)    Difficulty of Paying Living Expenses: Not hard at all  Food Insecurity: No Food Insecurity (06/17/2023)   Hunger  Vital Sign    Worried About Running Out of Food in the Last Year: Never true    Ran Out of Food in the Last Year: Never true  Transportation Needs: No Transportation Needs (06/17/2023)   PRAPARE - Administrator, Civil Service (Medical): No    Lack of Transportation (Non-Medical): No  Physical Activity: Insufficiently Active (06/17/2023)   Exercise Vital Sign    Days of Exercise per Week: 3 days    Minutes of Exercise per Session: 30 min  Stress: Stress Concern Present (06/17/2023)   [employer] of Occupational Health - Occupational Stress Questionnaire    Feeling of Stress : To some extent  Social Connections: Socially Integrated (06/17/2023)   Social Connection and Isolation Panel [NHANES]    Frequency of Communication with Friends and Family: More than three times a week    Frequency of Social Gatherings with Friends and Family: Once a week    Attends Religious Services: More than 4 times per year    Active Member of [employer] or Organizations: Yes    Attends Banker Meetings: More than 4 times per year    Marital Status: Married  Catering manager Violence: Not At Risk (09/11/2022)   Humiliation, Afraid, Rape, and Kick questionnaire    Fear of  Current or Ex-Partner: No    Emotionally Abused: No    Physically Abused: No    Sexually Abused: No    Past Surgical History:  Procedure Laterality Date   APPENDECTOMY  1959   BACK SURGERY     1996   BREAST LUMPECTOMY WITH AXILLARY LYMPH NODE BIOPSY Left 11/15/2021   Procedure: LEFT BREAST LUMPECTOMY WITH AXILLARY SENTINEL LYMPH NODE BIOPSY;  Surgeon: Emelia Loron, MD;  Location: [facility];  Service: General;  Laterality: Left;   PORTACATH PLACEMENT Right 11/15/2021   Procedure: INSERTION PORT-A-CATH;  Surgeon: Emelia Loron, MD;  Location: [facility];  Service: General;  Laterality: Right;   TONSILLECTOMY AND ADENOIDECTOMY  1954    Family History  Problem Relation Age of  Onset   Stomach cancer Mother 15   Heart attack Father 66   Cancer Sister        dx mid 43s; unknown primary w/ mets; treated like ovarian cancer   Heart attack Brother 9   Heart attack Brother 3    Allergies  Allergen Reactions   Other Other (See Comments)    SPICEY FOODS- gagging,cough,burning sensation   Ampicillin Other (See Comments)    Drug fever Did it involve swelling of the face/tongue/throat, SOB, or low BP? No Did it involve sudden or severe rash/hives, skin peeling, or any reaction on the inside of your mouth or nose? No Did you need to seek medical attention at a hospital or doctor's office? No When did it last happen? Over 10 years ago If all above answers are "NO", may proceed with cephalosporin use.   Anesthesia S-I-40 [Propofol] Nausea And Vomiting    Current Outpatient Medications on File Prior to Visit  Medication Sig Dispense Refill   anastrozole (ARIMIDEX) 1 MG tablet Take 1 tablet by mouth once daily 90 tablet 0   apixaban (ELIQUIS) 5 MG TABS tablet Take 1 tablet (5 mg total) by mouth 2 (two) times daily. 60 tablet 6   atorvastatin (LIPITOR) 20 MG tablet Take 1 tab a week     Cholecalciferol (VITAMIN D) 50 MCG (2000 UT) tablet Take 2,000 Units by mouth daily.     Multiple Vitamin (MULTIVITAMIN) capsule Take 1 capsule by mouth daily.     No current facility-administered medications on file prior to visit.    BP 130/88   Pulse (!) 49   Temp 97.9 F (36.6 C) (Oral)   Ht 5\' 5"  (1.651 m)   Wt 123 lb (55.8 kg)   SpO2 99%   BMI 20.47 kg/m       Objective:   Physical Exam Vitals and nursing note reviewed.  Constitutional:      Appearance: Normal appearance.  Cardiovascular:     Rate and Rhythm: Normal rate and regular rhythm.     Pulses: Normal pulses.     Heart sounds: Normal heart sounds.  Pulmonary:     Effort: Pulmonary effort is normal.     Breath sounds: Normal breath sounds.  Skin:    General: Skin is warm and dry.  Neurological:      General: No focal deficit present.     Mental Status: She is alert and oriented to person, place, and time.  Psychiatric:        Mood and Affect: Mood normal.        Behavior: Behavior normal.        Thought Content: Thought content normal.        Judgment: Judgment normal.  Assessment & Plan:  1. Mixed hyperlipidemia (Primary)  - Lipid panel; Future - Hepatic function panel; Future  [name], NP  Time spent with patient today was 42 minutes which consisted of chart review, discussing hyperlipidemia, work up, treatment, listening, answering questions and documentation.

## 2023-06-18 NOTE — Telephone Encounter (Signed)
 I tried calling Patricia Marshall this morning, no answer, will try calling her back.

## 2023-06-24 NOTE — Telephone Encounter (Signed)
 RE: antiphospholipid syndrome Received: Today De Blanch, LPN  Sherrie George, RN; [name], NP; Rachel Moulds, MD Patricia Marshall,  Thank you so much! Yes, 5 mg is starting dose.  Vernona Rieger, LPN       Previous Messages    ----- Message ----- From: Sherrie George, RN Sent: 06/24/2023  12:55 PM EDT To: [name], NP; De Blanch, LPN; * Subject: RE: antiphospholipid syndrome                  I would love to manage her warfarin. I will contact the pt this afternoon to get her set up for warfarin management. I will follow Dr. Remonia Richter instructions concerning Eliquis. Protocol for starting pt on warfarin is starting with a 5 mg tablet daily. Please advise if ok to start on 5 mg tablet and I can send in after discussing with pt. Thank you, Carollee Herter, RN ----- Message ----- From: [name], NP Sent: 06/24/2023  11:51 AM EDT To: Sherrie George, RN; De Blanch, LPN; * Subject: RE: antiphospholipid syndrome                  Hi Vernona Rieger,  I am going to attach Sherrie George, our Coumadin nurse to this message. I think she will be willing to help manage her  Kandee Keen ----- Message ----- From: De Blanch, LPN Sent: 10/17/9560  11:29 AM EDT To: [name], NP; Rachel Moulds, MD; [facility] Subject: antiphospholipid syndrome                      Hi Karen Kays am Vernona Rieger, one of Dr Remonia Richter nurses at [facility]. [name] was recently diagnosed with antiphospholipid syndrome and Dr Al Pimple is recommending lifelong coumadin. She is currently on Eliquis. In order to better serve the patient, Dr Al Pimple wants to know if your office is willing to manage pt's coumadin and PT/INR labs? If would be more cost efficient for the pt to come to your office for management of this. Given her diagnoses, the coumadin clinic is not willing to assist. Ideally, per Dr Al Pimple, the goal is for her to start Coumadin tomorrow, continue Eliquis for 3 days while taking the Coumadin, then d/c Eliquis  and check labs 5 days after. Please let us know ASAP if you are able to assist.  Milinda Cave, LPN for Dr Al Pimple

## 2023-06-24 NOTE — Telephone Encounter (Signed)
 Contacted pt and educated concerning warfarin. Requested pt have coumadin clinic apt for further education and printed education material and time to answer questions. Pt agreed to apt tomorrow at [facility].  Advised pt to continue Eliquis as prescribed and after her apt tomorrow the warfarin will be sent in and she will be instructed on how to take it at that time. Pt verbalized understanding and was appreciative of the call.

## 2023-06-25 ENCOUNTER — Ambulatory Visit (INDEPENDENT_AMBULATORY_CARE_PROVIDER_SITE_OTHER)

## 2023-06-25 DIAGNOSIS — Z7901 Long term (current) use of anticoagulants: Secondary | ICD-10-CM

## 2023-06-25 MED ORDER — WARFARIN SODIUM 5 MG PO TABS: ORAL_TABLET | ORAL | 0 refills | Status: DC

## 2023-06-25 NOTE — Progress Notes (Signed)
 I have reviewed and agree with note, evaluation, plan.   Tana Conch, MD

## 2023-06-25 NOTE — Progress Notes (Addendum)
 No POCT INR was performed today. New pt to warfarin. Prior taking Eliquis. 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 Pt will start warfarin tonight and continue Eliquis for 3 days per oncology.  Continue Eliquis twice daily with taking warfarin, stop Eliquis on Saturday, 4/12. Take 1 tablet warfarin daily in the eveing and check INR in 1 week.  A full discussion of the nature of anticoagulants has been carried out.  A benefit risk analysis has been presented to the patient, so that they understand the justification for choosing anticoagulation at this time. The need for frequent and regular monitoring, precise dosage adjustment and compliance is stressed.  Side effects of potential bleeding are discussed.  The patient should avoid any OTC items containing aspirin or ibuprofen, and should avoid great swings in general diet.  Avoid alcohol consumption.  Call if any signs of abnormal bleeding.   Pt was also provided written educational material and a book from the [facility] [employer] for further research. Sent in warfarin script.

## 2023-06-25 NOTE — Patient Instructions (Addendum)
 Pre visit review using our clinic review tool, if applicable. No additional management support is needed unless otherwise documented below in the visit note.  Continue Eliquis twice daily with taking warfarin, stop Eliquis on Saturday, 4/12. Take 1 tablet warfarin daily in the eveing and check INR in 1 week.

## 2023-07-01 ENCOUNTER — Telehealth: Payer: Self-pay

## 2023-07-01 NOTE — Telephone Encounter (Signed)
 Pt reports she has an apt with gastroenterology on Monday, 4/21 to meet her new provider and also to discuss her 5 yr f/u colonoscopy that will be due this fall.  She is wondering since just starting the warfarin if she should keep that apt or not. Advised to keep apt and to have the colonoscopy scheduled for this fall would be appropriate and her warfarin should be regulated by that time and we can discuss before the surgery if a lovenox bridge is needed and warfarin dosing around the procedure.    Pt was appreciative and verbalized understanding.

## 2023-07-02 ENCOUNTER — Telehealth: Payer: Self-pay

## 2023-07-02 ENCOUNTER — Ambulatory Visit (INDEPENDENT_AMBULATORY_CARE_PROVIDER_SITE_OTHER)

## 2023-07-02 DIAGNOSIS — Z7901 Long term (current) use of anticoagulants: Secondary | ICD-10-CM

## 2023-07-02 LAB — PROTIME-INR
INR: 10.9 ratio (ref 0.8–1.0)
Prothrombin Time: 103.2 s (ref 9.6–13.1)

## 2023-07-02 LAB — POCT INR: INR: 7.5 — AB (ref 2.0–3.0)

## 2023-07-02 NOTE — Patient Instructions (Addendum)
 Pre visit review using our clinic review tool, if applicable. No additional management support is needed unless otherwise documented below in the visit note.  Hold warfarin for the next four days. Restart warfarin on Sunday, 4/20. Take 1/2 tablet on Sunday and 1/2 tablet on Monday.  Recheck on Tuesday, 4/22 at [facility], at [address]

## 2023-07-02 NOTE — Telephone Encounter (Signed)
 Per protocol, INR >10 send script for 5 mg vitamin K tablet (mephytoin/phytonadione). Pt is to take 2.5 mg (1/2 tablet) and keep other half. This medication is not normally carried in pharmacies. Contacted pharmacies below to inquire if they have medication. Pt denied any bleeding during visit today.  Contacted [facility] and [facility]. Neither have a 5 mg vitamin K tablet. Contacted [facility] and they do have it but close at 6 pm. Contacted [facility] on [address] and spoke with pharmacist who reports they do not have it.   Discussed with Dr. Arliss Lam. Will f/u with pt in the morning and prescribe the vitamin K for her to pick up at [facility]. Pt's husband was educated thoroughly on signs and symptoms of abnormal bruising or bleeding and what to do if that occurs. He verbalized understanding.

## 2023-07-02 NOTE — Telephone Encounter (Signed)
 Critical values called in for patient.  Protime 103 INR 10.9  Alerted Coumadin nurse and Provider that signed off on order.

## 2023-07-02 NOTE — Progress Notes (Signed)
 Pt followed instructions for dosing for warfarin and Eliquis. Hold warfarin for the next four days. Restart warfarin on Sunday, 4/20. Take 1/2 tablet on Sunday and 1/2 tablet on Monday.  Recheck on Tuesday, 4/22 at [facility], at [address] Had lab INR drawn and sent STAT to verify POCT INR. Pt and her husband was given instructions if any s/s of abnormal bruising or bleeding. Pt denies any current s/s of abnormal bruising or bleeding. Advised the clinic will be closed on Friday, 4/18 and if needed they can use the clinic number and the call will go to a nurse to answer any questions they may have. Advised this nurse will be out of the office Monday, 4/21, also and to contact clinic if any questions. Both verbalized understanding.  Brought result to Dr. Arliss Lam attention since pt's PCP is not in the office today.

## 2023-07-03 ENCOUNTER — Other Ambulatory Visit (HOSPITAL_COMMUNITY): Payer: Self-pay

## 2023-07-03 ENCOUNTER — Encounter: Payer: Self-pay | Admitting: Hematology and Oncology

## 2023-07-03 MED ORDER — PHYTONADIONE 5 MG PO TABS
ORAL_TABLET | ORAL | 0 refills | Status: AC
  Filled 2023-07-03: qty 1, 1d supply, fill #0

## 2023-07-03 NOTE — Addendum Note (Signed)
 Addended by: Ian Maine A on: 07/03/2023 10:14 AM   Modules accepted: Orders

## 2023-07-03 NOTE — Addendum Note (Signed)
 Addended by: Ian Maine A on: 07/03/2023 11:25 AM   Modules accepted: Orders

## 2023-07-03 NOTE — Telephone Encounter (Signed)
 [facility] pharmacy address is [address]

## 2023-07-03 NOTE — Telephone Encounter (Signed)
 Contacted pt and advised of need for vitamin K tablet. Advised to take 1/2 tablet of vitamin K and place the other half away incase needed at a later date. Advised per Dr. Arliss Lam restart  warfarin on Saturday, 4/19, taking 1/2 tablet of warfarin.  Pt still denies any bleeding or abnormal bruising. Advised if any abnormal bruising or bleeding go to the ER.  Pt verbalized understanding and readback warfarin instructions and instructions for vitamin K tablet.

## 2023-07-07 DIAGNOSIS — Z860100 Personal history of colon polyps, unspecified: Secondary | ICD-10-CM | POA: Diagnosis not present

## 2023-07-07 DIAGNOSIS — Z7901 Long term (current) use of anticoagulants: Secondary | ICD-10-CM | POA: Diagnosis not present

## 2023-07-08 ENCOUNTER — Ambulatory Visit (INDEPENDENT_AMBULATORY_CARE_PROVIDER_SITE_OTHER)

## 2023-07-08 DIAGNOSIS — Z7901 Long term (current) use of anticoagulants: Secondary | ICD-10-CM | POA: Diagnosis not present

## 2023-07-08 LAB — POCT INR: INR: 1.5 — AB (ref 2.0–3.0)

## 2023-07-08 NOTE — Patient Instructions (Addendum)
 Pre visit review using our clinic review tool, if applicable. No additional management support is needed unless otherwise documented below in the visit note.  Continue 1/2 tablet daily. Recheck on 4/25.

## 2023-07-08 NOTE — Progress Notes (Signed)
 Pt had supratherapeutic INR at first INR check on 4/16 after one week of warfarin. Pt held warfarin and was prescribed 2.5 mg vitamin K tablet, which she took on 4/17. Restarted warfarin on 4/19 at 1/2 original warfarin dose. Pt denies any abnormal bruising or bleeding.  Continue 1/2 tablet daily. Recheck on 4/25.

## 2023-07-11 ENCOUNTER — Ambulatory Visit

## 2023-07-11 DIAGNOSIS — Z7901 Long term (current) use of anticoagulants: Secondary | ICD-10-CM | POA: Diagnosis not present

## 2023-07-11 LAB — POCT INR: INR: 1.8 — AB (ref 2.0–3.0)

## 2023-07-11 NOTE — Patient Instructions (Addendum)
 Pre visit review using our clinic review tool, if applicable. No additional management support is needed unless otherwise documented below in the visit note.  Continue 1/2 tablet daily. Recheck on 4/30.

## 2023-07-11 NOTE — Progress Notes (Signed)
 Pt had supratherapeutic INR at first INR check on 4/16 after one week of 5 mg daily warfarin. Pt held warfarin for 3 days and was prescribed 2.5 mg vitamin K tablet, which she took on 4/17. Restarted warfarin on 4/19 at 1/2 original warfarin dose and was retested on 2/22 with an INR of 1.5.  Continue 1/2 tablet daily. Recheck on 4/30. Sent msg to PCP as requested.

## 2023-07-16 ENCOUNTER — Ambulatory Visit

## 2023-07-16 DIAGNOSIS — Z7901 Long term (current) use of anticoagulants: Secondary | ICD-10-CM

## 2023-07-16 LAB — POCT INR: INR: 1.8 — AB (ref 2.0–3.0)

## 2023-07-16 MED ORDER — WARFARIN SODIUM 2.5 MG PO TABS: ORAL_TABLET | ORAL | 0 refills | Status: DC

## 2023-07-16 NOTE — Patient Instructions (Addendum)
 Pre visit review using our clinic review tool, if applicable. No additional management support is needed unless otherwise documented below in the visit note.  Pick up new prescription for warfarin for 2.5 mg dose.  Take 5 mg today and then change weekly dose to take 2.5 mg daily except take 3.75 mg on Monday. Recheck in 1 week.

## 2023-07-16 NOTE — Progress Notes (Signed)
 Pt has leveled off with INR. In order to dose more precisely with a lower dose tablet. Will send in script for 2.5 mg tablet.  Pick up new prescription for warfarin for 2.5 mg dose.  Start using the 2.5 mg tablets tomorrow. Take 5 mg today and then change weekly dose to take 2.5 mg daily except take 3.75 mg on Monday. Recheck in 1 week. Sent in new script for 2.5 mg tablets.

## 2023-07-23 ENCOUNTER — Ambulatory Visit (INDEPENDENT_AMBULATORY_CARE_PROVIDER_SITE_OTHER)

## 2023-07-23 DIAGNOSIS — Z7901 Long term (current) use of anticoagulants: Secondary | ICD-10-CM | POA: Diagnosis not present

## 2023-07-23 LAB — POCT INR: INR: 2.5 (ref 2.0–3.0)

## 2023-07-23 NOTE — Patient Instructions (Addendum)
 Pre visit review using our clinic review tool, if applicable. No additional management support is needed unless otherwise documented below in the visit note.  Continue 2.5 mg daily except take 3.75 mg on Monday. Recheck in 1 week.

## 2023-07-23 NOTE — Progress Notes (Signed)
 Scheduled for a colonoscopy for 5/30. Will discuss with provider if this needs to be pushed back further to assure pt is stable on warfarin dosing.

## 2023-07-23 NOTE — Progress Notes (Signed)
 Continue 2.5 mg daily except take 3.75 mg on Monday. Recheck in 1 week.

## 2023-07-24 ENCOUNTER — Telehealth: Payer: Self-pay

## 2023-07-24 NOTE — Telephone Encounter (Signed)
 Contacted pt and advised it is advised to push surgery back a couple of months if GI does not object. Advised she will be placed on a Lovenox bridge also around the surgery. Pt will contact [facility] and RS the colonoscopy for around July. She will let coumadin  clinic know if GI does not agree. Advised if any questions before coumadin  clinic apt next week to call. Pt verbalized understanding.

## 2023-07-24 NOTE — Telephone Encounter (Signed)
 Pt started warfarin about 1 month ago with a very supratherapeutic INR at the beginning of the treatment. INR has now leveled off but only have one month of INR results. INR at last check was 2.5, on 5/7, was therapeutic but this was the first INR in range since starting.   Pt revealed she had an apt with [facility] and has been scheduled for her 5 year colonoscopy screening for 5/30. Advised pt it may be advised to push this surgery back depending on how stable her INR remains. Advised this nurse will discuss with PCP concerning the date for the surgery and if a Lovenox bridge was necessary. Pt verbalized understanding.   Recommendation from coumadin  clinic is to place pt on a Lovenox bridge. Will forward msg to provider for advisement on current date for surgery, 5/30, and if a bridge is advised.

## 2023-07-30 ENCOUNTER — Ambulatory Visit

## 2023-07-30 DIAGNOSIS — Z7901 Long term (current) use of anticoagulants: Secondary | ICD-10-CM

## 2023-07-30 LAB — POCT INR: INR: 2.1 (ref 2.0–3.0)

## 2023-07-30 NOTE — Patient Instructions (Addendum)
 Pre visit review using our clinic review tool, if applicable. No additional management support is needed unless otherwise documented below in the visit note.  Continue 2.5 mg daily except take 3.75 mg on Monday. Recheck in 1 week.

## 2023-07-30 NOTE — Progress Notes (Signed)
 Scheduled for a colonoscopy for 5/30. Will discuss with provider if this needs to be pushed back further to assure pt is stable on warfarin dosing.  Provider agrees colonoscopy should be pushed back if possible. Pt was contacted last week and advised of this msg.  Pt reports the colonoscopy has been RS to July but she cannot remember the date. She will bring that the next apt.  Continue 2.5 mg daily except take 3.75 mg on Monday. Recheck in 1 week. Pt will need refill of warfarin at next visit.

## 2023-08-06 ENCOUNTER — Ambulatory Visit (INDEPENDENT_AMBULATORY_CARE_PROVIDER_SITE_OTHER)

## 2023-08-06 DIAGNOSIS — Z7901 Long term (current) use of anticoagulants: Secondary | ICD-10-CM | POA: Diagnosis not present

## 2023-08-06 LAB — POCT INR: INR: 1.8 — AB (ref 2.0–3.0)

## 2023-08-06 MED ORDER — WARFARIN SODIUM 2.5 MG PO TABS
ORAL_TABLET | ORAL | 1 refills | Status: DC
Start: 2023-08-06 — End: 2023-11-05

## 2023-08-06 NOTE — Patient Instructions (Addendum)
 Pre visit review using our clinic review tool, if applicable. No additional management support is needed unless otherwise documented below in the visit note.  Increase dose today to take 1 1/2  tablets and then continue 2.5 mg daily except take 3.75 mg on Monday. Recheck in 2 week.

## 2023-08-06 NOTE — Progress Notes (Signed)
 Pt reports the colonoscopy has been RS to July 28th Increase dose today to take 1 1/2  tablets and then continue 2.5 mg daily except take 3.75 mg on Monday. Recheck in 2 week. Pt is compliant with warfarin management and PCP apts.  Sent in refill of warfarin to requested pharmacy.

## 2023-08-13 ENCOUNTER — Other Ambulatory Visit: Payer: Self-pay | Admitting: Adult Health

## 2023-08-13 DIAGNOSIS — Z7901 Long term (current) use of anticoagulants: Secondary | ICD-10-CM

## 2023-08-13 NOTE — Telephone Encounter (Unsigned)
 Copied from CRM [id number]. Topic: Clinical - Medication Refill >> Aug 13, 2023  3:22 PM Vivian Z wrote: Medication: warfarin (COUMADIN ) 2.5 MG tablet  Has the patient contacted their pharmacy? yes (Agent: If no, request that the patient contact the pharmacy for the refill. If patient does not wish to contact the pharmacy document the reason why and proceed with request.) (Agent: If yes, when and what did the pharmacy advise?) pharmacy asked patient to contact dr. Clare Critchley because prescription is expired.  This is the patient's preferred pharmacy:  [facility] [address], [place] - [address]  [place] [address] Phone: [phone] Fax: [phone]  Is this the correct pharmacy for this prescription? Yes If no, delete pharmacy and type the correct one.   Has the prescription been filled recently? No  Is the patient out of the medication? No Will be running out on 08/16/23  Has the patient been seen for an appointment in the last year OR does the patient have an upcoming appointment? Yes  Can we respond through MyChart? No  Agent: Please be advised that Rx refills may take up to 3 business days. We ask that you follow-up with your pharmacy.

## 2023-08-20 ENCOUNTER — Ambulatory Visit (INDEPENDENT_AMBULATORY_CARE_PROVIDER_SITE_OTHER)

## 2023-08-20 DIAGNOSIS — Z7901 Long term (current) use of anticoagulants: Secondary | ICD-10-CM | POA: Diagnosis not present

## 2023-08-20 LAB — POCT INR: INR: 1.6 — AB (ref 2.0–3.0)

## 2023-08-20 NOTE — Progress Notes (Signed)
 Pt reports the colonoscopy has been RS to July 28th. Increase dose today to take 1 1/2  tablets and then change weekly dose to take 2.5 mg daily except take 3.75 mg on Monday and Thursday. Recheck in 2 week.

## 2023-08-20 NOTE — Patient Instructions (Addendum)
 Pre visit review using our clinic review tool, if applicable. No additional management support is needed unless otherwise documented below in the visit note.  Increase dose today to take 1 1/2  tablets and then change weekly dose to take 2.5 mg daily except take 3.75 mg on Monday and Thursday. Recheck in 2 week.

## 2023-08-29 ENCOUNTER — Other Ambulatory Visit: Payer: Self-pay | Admitting: Hematology and Oncology

## 2023-08-29 DIAGNOSIS — C50212 Malignant neoplasm of upper-inner quadrant of left female breast: Secondary | ICD-10-CM

## 2023-09-03 ENCOUNTER — Ambulatory Visit (INDEPENDENT_AMBULATORY_CARE_PROVIDER_SITE_OTHER)

## 2023-09-03 DIAGNOSIS — Z7901 Long term (current) use of anticoagulants: Secondary | ICD-10-CM

## 2023-09-03 LAB — POCT INR: INR: 2 (ref 2.0–3.0)

## 2023-09-03 NOTE — Patient Instructions (Addendum)
 Pre visit review using our clinic review tool, if applicable. No additional management support is needed unless otherwise documented below in the visit note.  Change weekly dose to take 2.5 mg daily except take 3.75 mg on Monday, Wednesday and Friday. Recheck in 2 week.

## 2023-09-03 NOTE — Progress Notes (Signed)
 Pt reports the colonoscopy has been RS to July 28th Pt has been subtherapeutic the last two INR check and today is at there very bottom of her INR range. Would like INR to be closer to goal of 2.5 so a small change has been made to weekly dosing to push closer to the middle of INR range. Pt was in agreement with this small change in dosing. Advised pt to watch for s/s of bleeding or abnormal bruising and if any to go to ER. Pt verbalized understanding.  Change weekly dose to take 2.5 mg daily except take 3.75 mg on Monday, Wednesday and Friday. Recheck in 2 week.

## 2023-09-08 ENCOUNTER — Ambulatory Visit: Attending: General Surgery

## 2023-09-08 VITALS — Wt 119.4 lb

## 2023-09-08 DIAGNOSIS — Z483 Aftercare following surgery for neoplasm: Secondary | ICD-10-CM | POA: Insufficient documentation

## 2023-09-08 NOTE — Therapy (Signed)
 OUTPATIENT PHYSICAL THERAPY SOZO SCREENING NOTE   Patient Name: Patricia Marshall MRN: [id number] DOB:07-08-1945, 78 y.o., female Today's Date: 09/08/2023  PCP: Merna Huxley, NP REFERRING PROVIDER: Merna Huxley, NP   PT End of Session - 09/08/23 1535     Visit Number 11   # unchanged due to screen only   PT Start Time 1533    PT Stop Time 1540    PT Time Calculation (min) 7 min    Activity Tolerance Patient tolerated treatment well    Behavior During Therapy WFL for tasks assessed/performed          Past Medical History:  Diagnosis Date   Arthritis    Breast cancer (HCC)    Chicken pox    High cholesterol    Personal history of chemotherapy    Personal history of radiation therapy    PONV (postoperative nausea and vomiting)    Vitamin D  deficiency    Past Surgical History:  Procedure Laterality Date   APPENDECTOMY  1959   BACK SURGERY     1996   BREAST LUMPECTOMY WITH AXILLARY LYMPH NODE BIOPSY Left 11/15/2021   Procedure: LEFT BREAST LUMPECTOMY WITH AXILLARY SENTINEL LYMPH NODE BIOPSY;  Surgeon: Ebbie Cough, MD;  Location: [facility];  Service: General;  Laterality: Left;   PORTACATH PLACEMENT Right 11/15/2021   Procedure: INSERTION PORT-A-CATH;  Surgeon: Ebbie Cough, MD;  Location: [facility];  Service: General;  Laterality: Right;   TONSILLECTOMY AND ADENOIDECTOMY  1954   Patient Active Problem List   Diagnosis Date Noted   Acute deep vein thrombosis (DVT) of left lower extremity (HCC) 04/23/2022   Port-A-Cath in place 02/04/2022   Genetic testing 12/07/2021   Malignant neoplasm of upper-inner quadrant of left breast in female, estrogen receptor positive (HCC) 10/19/2021   Piriformis syndrome of left side 07/12/2015   Degenerative arthritis of left knee 07/12/2015   BEE STING REACTION, LOCAL 10/24/2006    REFERRING DIAG: left breast cancer at risk for lymphedema  THERAPY DIAG: Aftercare following surgery for  neoplasm  PERTINENT HISTORY: Recent DVTs (cleared by Dr Loretha that she can participate in PT) Patient was diagnosed on 09/20/2021 with left grade 2 invasive ductal carcinoma breast cancer. She underwent a left lumpectomy and sentinel node biopsy (2 negative nodes) on 11/15/2021. 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   PAIN:  Are you having pain? No  SOZO SCREENING: Patient was assessed today using the SOZO machine to determine the lymphedema index score. This was compared to her baseline score. It was determined that she is within the recommended range when compared to her baseline and no further action is needed at this time. She will continue SOZO screenings. These are done every 3 months for 2 years post operatively followed by every 6 months for 2 years, and then annually.   L-DEX FLOWSHEETS - 09/08/23 1500       L-DEX LYMPHEDEMA SCREENING   Measurement Type Unilateral    L-DEX MEASUREMENT EXTREMITY Upper Extremity    POSITION  Standing    DOMINANT SIDE Right    At Risk Side Left    BASELINE SCORE (UNILATERAL) -3    L-DEX SCORE (UNILATERAL) -1.4    VALUE CHANGE (UNILAT) 1.6          P:  Can begin 6 month after next screen.   Aden Berwyn Caldron, PTA 09/08/2023, 3:40 PM

## 2023-09-11 ENCOUNTER — Other Ambulatory Visit: Payer: Self-pay | Admitting: Adult Health

## 2023-09-11 DIAGNOSIS — Z7901 Long term (current) use of anticoagulants: Secondary | ICD-10-CM

## 2023-09-11 NOTE — Telephone Encounter (Signed)
 Will have pt get lab draw at her coumadin  clinic apt next week.

## 2023-09-11 NOTE — Telephone Encounter (Signed)
 Pt scheduled for colonoscopy on 7/28 and will need a Lovenox bridge.  Need to assess creatinine clearance for Lovenox injections.  Last CMP was October 2024. Will need lab more current lab to calculate CrCl.   Sending msg to PCP requesting lab draw for creatinine at next coumadin  clinic apt, which is 7/2.

## 2023-09-17 ENCOUNTER — Other Ambulatory Visit

## 2023-09-17 ENCOUNTER — Ambulatory Visit (INDEPENDENT_AMBULATORY_CARE_PROVIDER_SITE_OTHER)

## 2023-09-17 DIAGNOSIS — Z7901 Long term (current) use of anticoagulants: Secondary | ICD-10-CM | POA: Diagnosis not present

## 2023-09-17 LAB — POCT INR: INR: 2.2 (ref 2.0–3.0)

## 2023-09-17 NOTE — Patient Instructions (Addendum)
 Pre visit review using our clinic review tool, if applicable. No additional management support is needed unless otherwise documented below in the visit note.  Continue 2.5 mg daily except take 3.75 mg on Monday, Wednesday and Friday. Recheck in 2 week.

## 2023-09-17 NOTE — Progress Notes (Signed)
 Pt reports the colonoscopy has been RS to July 28th Continue 2.5 mg daily except take 3.75 mg on Monday, Wednesday and Friday. Recheck in 2 week. Pt will have lab draw today to check creatinine for Lovenox bridge she will need around the colonoscopy.  Pt c/o arthritis leg pain. Made apt with Dr. Johnny for next week.

## 2023-09-18 ENCOUNTER — Other Ambulatory Visit

## 2023-09-18 DIAGNOSIS — Z7901 Long term (current) use of anticoagulants: Secondary | ICD-10-CM

## 2023-09-18 LAB — BASIC METABOLIC PANEL WITH GFR
BUN: 17 mg/dL (ref 6–23)
CO2: 30 meq/L (ref 19–32)
Calcium: 10.1 mg/dL (ref 8.4–10.5)
Chloride: 103 meq/L (ref 96–112)
Creatinine, Ser: 0.73 mg/dL (ref 0.40–1.20)
GFR: 79.7 mL/min (ref >60.00)
Glucose, Bld: 75 mg/dL (ref 70–99)
Potassium: 4.3 meq/L (ref 3.5–5.1)
Sodium: 141 meq/L (ref 135–145)

## 2023-09-23 ENCOUNTER — Encounter: Payer: Self-pay | Admitting: Family Medicine

## 2023-09-23 ENCOUNTER — Ambulatory Visit (INDEPENDENT_AMBULATORY_CARE_PROVIDER_SITE_OTHER): Admitting: Family Medicine

## 2023-09-23 VITALS — BP 144/78 | HR 69 | Temp 98.1°F | Ht 65.0 in | Wt 118.6 lb

## 2023-09-23 DIAGNOSIS — M1712 Unilateral primary osteoarthritis, left knee: Secondary | ICD-10-CM | POA: Diagnosis not present

## 2023-09-23 NOTE — Progress Notes (Signed)
   Subjective:    Patient ID: Patricia Marshall, female    DOB: 06-23-1945, 78 y.o.   MRN: [id number]  HPI Here for intermittent pain in the left knee the past 2 weeks. She has seen Dr. Melodi at [facility] for this, and Xrays show osteoarthritis. When the pain flares there is no swelling or warmth or redness in the knee. Applying ice helps with the pain. She is hesitant to take any medications for this because she is worried about possible interference with her other medications, like Warfarin.    Review of Systems  Constitutional: Negative.   Respiratory: Negative.    Cardiovascular: Negative.   Musculoskeletal:  Positive for arthralgias.       Objective:   Physical Exam Constitutional:      Appearance: Normal appearance.     Comments: Walks normally   Cardiovascular:     Rate and Rhythm: Normal rate and regular rhythm.     Pulses: Normal pulses.     Heart sounds: Normal heart sounds.  Pulmonary:     Effort: Pulmonary effort is normal.     Breath sounds: Normal breath sounds.  Musculoskeletal:     Comments: Left knee appears normal with no swelling. She is mildly tender along the medial joint space. ROM is full but some crepitance is present   Neurological:     Mental Status: She is alert.           Assessment & Plan:  Left knee pain due to OA. I reassured her that she could take Tylenol  for the pain since this does not interfere with any of her medications. She can take up to 3000 mg a day if needed. She can also wear an elastic support sleeve. Recheck as needed.  Garnette Olmsted, MD

## 2023-09-24 NOTE — Telephone Encounter (Signed)
 Noted, will educate pt at next coumadin  clinic.

## 2023-09-24 NOTE — Telephone Encounter (Signed)
 Colonoscopy scheduled for 7/28. Recommendation for use of Lovenox bridge around surgery. Anticoagulation indication: APS Actual Wt: 53.8 kg (this is 95% of ideal; can use actual wt for CrCl calculation) Ideal Wt: 57 kg   CrCl: 56.22 mL/min (meets once daily Lovenox injection parameters)  Lovenox dose recommendation:1.5  x actual wt=mg daily;  80 mg Q24H Current warfarin dosing; 1 tablet (2.5 mg) daily except take 1 1/2 tablets (3.75 mg) on Monday, Wednesday and Friday.   Lovenox Bridge;  7/23: Take last dose of warfarin 7/24: NO warfarin, NO Lovenox 7/25: NO warfarin, inject Lovenox once in the AM 7/26: NO warfarin, inject Lovenox once in the AM 7/27: NO warfarin, inject Lovenox once in the AM (BEFORE 7 AM)   7/28: SURGERY; NO WARFARIN, NO LOVENOX  7/29: Take 1 1/2 tablets (3.75 mg) warfarin, inject Lovenox once in the AM 7/30: Take 2 tablets (5 mg) warfarin, inject Lovenox once in the AM 7/31: Take 1 1/2 tablets (3.75 mg) warfarin, inject Lovenox once in the AM 8/1: Take 2 1/2 tablets (6.25 mg) warfarin, inject Lovenox once in the AM 8/2: Take 1 tablet (2.5 mg) warfarin, inject Lovenox once in the AM 8/3: Take 1 tablet (2.5 mg) warfarin, inject Lovenox once in the AM 8/4: Recheck INR; NO WARFARIN AND NO LOVENOX UNTIL AFTER INR CHECK

## 2023-09-24 NOTE — Patient Instructions (Addendum)
 Pre visit review using our clinic review tool, if applicable. No additional management support is needed unless otherwise documented below in the visit note.  Continue 2.5 mg daily except take 3.75 mg on Monday, Wednesday and Friday. Recheck in 3 weeks.

## 2023-09-25 ENCOUNTER — Ambulatory Visit: Admitting: Adult Health

## 2023-09-25 ENCOUNTER — Telehealth: Payer: Self-pay

## 2023-09-25 NOTE — Telephone Encounter (Signed)
 Pt has decided she is going to cancel her colonoscopy scheduled for 7/28. She is not sure when she will reschedule but will let the coumadin  clinic know.

## 2023-09-25 NOTE — Telephone Encounter (Signed)
 Pt reports she is just not ready for this upcoming colonoscopy.  Pt reports she is very anxious about the warfarin and Lovenox and does not think this is the right time now. Advised this is ok to Rs and to let the coumadin  clinic know when she has a new date.   Pt appreciative and verbalized understanding.

## 2023-10-01 ENCOUNTER — Ambulatory Visit (INDEPENDENT_AMBULATORY_CARE_PROVIDER_SITE_OTHER)

## 2023-10-01 DIAGNOSIS — Z7901 Long term (current) use of anticoagulants: Secondary | ICD-10-CM | POA: Diagnosis not present

## 2023-10-01 LAB — POCT INR: INR: 3 (ref 2.0–3.0)

## 2023-10-01 NOTE — Progress Notes (Signed)
 Pt is scheduled for colonoscopy for 7/28 and will be placed on a Lovenox bridge but she has now decided to RS it to a later date.  Continue 2.5 mg daily except take 3.75 mg on Monday, Wednesday and Friday. Recheck in 3 weeks.

## 2023-10-08 ENCOUNTER — Other Ambulatory Visit: Payer: Self-pay | Admitting: Hematology and Oncology

## 2023-10-20 ENCOUNTER — Ambulatory Visit

## 2023-10-20 DIAGNOSIS — Z7901 Long term (current) use of anticoagulants: Secondary | ICD-10-CM | POA: Diagnosis not present

## 2023-10-20 LAB — POCT INR: INR: 1.9 — AB (ref 2.0–3.0)

## 2023-10-20 NOTE — Progress Notes (Signed)
 Pt was scheduled for colonoscopy for 7/28 and will be placed on a Lovenox bridge but she has now decided to RS it to a later date.  Pt reports she has been eating a lot of brusels sprouts, tomatoes and cucumbers. Advised pt to keep vitamin K food consistent. Pt verbalized understanding. Pt also reported her appetite in general has diminished. Increase dose today to take 2 tablets and the continue 2.5 mg daily except take 3.75 mg on Monday, Wednesday and Friday. Recheck in 2 weeks.

## 2023-10-20 NOTE — Patient Instructions (Addendum)
 Pre visit review using our clinic review tool, if applicable. No additional management support is needed unless otherwise documented below in the visit note.  Increase dose today to take 2 tablets and the continue 2.5 mg daily except take 3.75 mg on Monday, Wednesday and Friday. Recheck in 2 weeks.

## 2023-10-27 ENCOUNTER — Encounter: Payer: Self-pay | Admitting: Adult Health

## 2023-10-28 ENCOUNTER — Encounter: Payer: Self-pay | Admitting: Hematology and Oncology

## 2023-10-28 ENCOUNTER — Other Ambulatory Visit: Payer: Self-pay | Admitting: *Deleted

## 2023-10-28 DIAGNOSIS — Z170 Estrogen receptor positive status [ER+]: Secondary | ICD-10-CM

## 2023-10-28 DIAGNOSIS — C50212 Malignant neoplasm of upper-inner quadrant of left female breast: Secondary | ICD-10-CM

## 2023-10-28 NOTE — Telephone Encounter (Signed)
 Noted

## 2023-11-03 ENCOUNTER — Telehealth: Payer: Self-pay

## 2023-11-03 ENCOUNTER — Ambulatory Visit

## 2023-11-03 NOTE — Telephone Encounter (Signed)
 Pt called to RS her coumadin  clinic apt for today due to husband currently in the ER. She reports they are transferring him to [facility]. Inquired if anything is needed. She denied any needs at this time. Advised to continue current dosing. Pt requested to be Rs to 8/20. RS apt. Advised if anything is needed to contact the coumadin  clinic. Pt verbalized understanding.

## 2023-11-05 ENCOUNTER — Ambulatory Visit (INDEPENDENT_AMBULATORY_CARE_PROVIDER_SITE_OTHER)

## 2023-11-05 DIAGNOSIS — Z7901 Long term (current) use of anticoagulants: Secondary | ICD-10-CM

## 2023-11-05 LAB — POCT INR: INR: 2.6 (ref 2.0–3.0)

## 2023-11-05 MED ORDER — WARFARIN SODIUM 2.5 MG PO TABS: ORAL_TABLET | ORAL | 1 refills | Status: AC

## 2023-11-05 NOTE — Patient Instructions (Addendum)
 Pre visit review using our clinic review tool, if applicable. No additional management support is needed unless otherwise documented below in the visit note.  Continue 2.5 mg daily except take 3.75 mg on Monday, Wednesday and Friday. Recheck in 3 weeks.

## 2023-11-05 NOTE — Progress Notes (Signed)
 Pt was scheduled for colonoscopy for 7/28 and will be placed on a Lovenox bridge but she has now decided to RS it to a later date.  Continue 2.5 mg daily except take 3.75 mg on Monday, Wednesday and Friday. Recheck in 3 weeks. Can move to 4 weeks if next INR is in range.   Pt is compliant with warfarin management and PCP apts.  Sent in refill of warfarin to requested pharmacy.

## 2023-11-20 DIAGNOSIS — K080 Exfoliation of teeth due to systemic causes: Secondary | ICD-10-CM | POA: Diagnosis not present

## 2023-11-24 ENCOUNTER — Telehealth: Payer: Self-pay

## 2023-11-24 NOTE — Telephone Encounter (Signed)
 Pt requested apt moved to 4:30 on 9/10 due to conflicting schedule. Moved apt to 4:30.

## 2023-11-24 NOTE — Telephone Encounter (Signed)
 Pt LVM reporting she wants to talk about her coumadin  clinic apt on 9/10.  LVM for pt to return call.

## 2023-11-26 ENCOUNTER — Ambulatory Visit (INDEPENDENT_AMBULATORY_CARE_PROVIDER_SITE_OTHER)

## 2023-11-26 DIAGNOSIS — Z7901 Long term (current) use of anticoagulants: Secondary | ICD-10-CM | POA: Diagnosis not present

## 2023-11-26 LAB — POCT INR: INR: 2.6 (ref 2.0–3.0)

## 2023-11-26 NOTE — Progress Notes (Signed)
 Pt was scheduled for colonoscopy for 7/28 and will be placed on a Lovenox bridge but she has now decided to RS it to a later date.  Continue 2.5 mg daily except take 3.75 mg on Monday, Wednesday and Friday. Recheck in 4 weeks.

## 2023-11-26 NOTE — Patient Instructions (Addendum)
 Pre visit review using our clinic review tool, if applicable. No additional management support is needed unless otherwise documented below in the visit note.  Continue 2.5 mg daily except take 3.75 mg on Monday, Wednesday and Friday. Recheck in 4 weeks.

## 2023-12-04 DIAGNOSIS — H2513 Age-related nuclear cataract, bilateral: Secondary | ICD-10-CM | POA: Diagnosis not present

## 2023-12-08 ENCOUNTER — Ambulatory Visit: Attending: General Surgery

## 2023-12-08 VITALS — Wt 118.5 lb

## 2023-12-08 DIAGNOSIS — Z483 Aftercare following surgery for neoplasm: Secondary | ICD-10-CM | POA: Insufficient documentation

## 2023-12-08 NOTE — Therapy (Signed)
 OUTPATIENT PHYSICAL THERAPY SOZO SCREENING NOTE   Patient Name: Patricia Marshall MRN: [id number] DOB:[birth year], 78 y.o., female Today's Date: 12/08/2023  PCP: Merna Huxley, NP REFERRING PROVIDER: Ebbie Cough, MD   PT End of Session - 12/08/23 1619     Visit Number 11   # unchanged due to screen only   PT Start Time 1617    PT Stop Time 1622    PT Time Calculation (min) 5 min    Activity Tolerance Patient tolerated treatment well    Behavior During Therapy [facility] for tasks assessed/performed          Past Medical History:  Diagnosis Date   Allergy 25-30 years ago   Developed a drug fever   Arthritis 2017   In left knee   Breast cancer (HCC)    Chicken pox    High cholesterol    Personal history of chemotherapy    Personal history of radiation therapy    PONV (postoperative nausea and vomiting)    Vitamin D  deficiency    Past Surgical History:  Procedure Laterality Date   APPENDECTOMY  1959   Also tonsils were removed in the 1950s; back surgery in 2006.   BACK SURGERY     1996   BREAST LUMPECTOMY WITH AXILLARY LYMPH NODE BIOPSY Left 11/15/2021   Procedure: LEFT BREAST LUMPECTOMY WITH AXILLARY SENTINEL LYMPH NODE BIOPSY;  Surgeon: Ebbie Cough, MD;  Location: [facility];  Service: General;  Laterality: Left;   BREAST SURGERY  08.31.2023   Breast cancer   PORTACATH PLACEMENT Right 11/15/2021   Procedure: INSERTION PORT-A-CATH;  Surgeon: Ebbie Cough, MD;  Location: [facility];  Service: General;  Laterality: Right;   SPINE SURGERY  1996   1/3 disc removed   TONSILLECTOMY AND ADENOIDECTOMY  1954   Patient Active Problem List   Diagnosis Date Noted   Acute deep vein thrombosis (DVT) of left lower extremity (HCC) 04/23/2022   Port-A-Cath in place 02/04/2022   Genetic testing 12/07/2021   Malignant neoplasm of upper-inner quadrant of left breast in female, estrogen receptor positive (HCC) 10/19/2021   Piriformis  syndrome of left side 07/12/2015   Degenerative arthritis of left knee 07/12/2015   BEE STING REACTION, LOCAL 10/24/2006    REFERRING DIAG: left breast cancer at risk for lymphedema  THERAPY DIAG: Aftercare following surgery for neoplasm  PERTINENT HISTORY: Recent DVTs (cleared by Dr Loretha that she can participate in PT) Patient was diagnosed on 09/20/2021 with left grade 2 invasive ductal carcinoma breast cancer. She underwent a left lumpectomy and sentinel node biopsy (2 negative nodes) on 11/15/2021. 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   PAIN:  Are you having pain? No  SOZO SCREENING: Patient was assessed today using the SOZO machine to determine the lymphedema index score. This was compared to her baseline score. It was determined that she is within the recommended range when compared to her baseline and no further action is needed at this time. She will continue SOZO screenings. These are done every 3 months for 2 years post operatively followed by every 6 months for 2 years, and then annually.   L-DEX FLOWSHEETS - 12/08/23 1600       L-DEX LYMPHEDEMA SCREENING   Measurement Type Unilateral    L-DEX MEASUREMENT EXTREMITY Upper  Extremity    POSITION  Standing    DOMINANT SIDE Right    At Risk Side Left    BASELINE SCORE (UNILATERAL) -3    L-DEX SCORE (UNILATERAL) -0.7    VALUE CHANGE (UNILAT) 2.3          P: Begin 6 month screen.   Aden Berwyn Caldron, PTA 12/08/2023, 4:23 PM

## 2023-12-12 ENCOUNTER — Encounter: Payer: Self-pay | Admitting: Hematology and Oncology

## 2023-12-12 ENCOUNTER — Inpatient Hospital Stay

## 2023-12-12 ENCOUNTER — Inpatient Hospital Stay: Attending: Hematology and Oncology | Admitting: Hematology and Oncology

## 2023-12-12 VITALS — BP 147/69 | HR 60 | Temp 98.7°F | Resp 18 | Ht 65.0 in | Wt 118.9 lb

## 2023-12-12 DIAGNOSIS — Z170 Estrogen receptor positive status [ER+]: Secondary | ICD-10-CM

## 2023-12-12 DIAGNOSIS — I824Z1 Acute embolism and thrombosis of unspecified deep veins of right distal lower extremity: Secondary | ICD-10-CM

## 2023-12-12 DIAGNOSIS — Z7901 Long term (current) use of anticoagulants: Secondary | ICD-10-CM | POA: Diagnosis not present

## 2023-12-12 DIAGNOSIS — Z8041 Family history of malignant neoplasm of ovary: Secondary | ICD-10-CM | POA: Diagnosis not present

## 2023-12-12 DIAGNOSIS — Z86718 Personal history of other venous thrombosis and embolism: Secondary | ICD-10-CM | POA: Insufficient documentation

## 2023-12-12 DIAGNOSIS — Z923 Personal history of irradiation: Secondary | ICD-10-CM | POA: Diagnosis not present

## 2023-12-12 DIAGNOSIS — Z800 Family history of malignant neoplasm of digestive organs: Secondary | ICD-10-CM | POA: Diagnosis not present

## 2023-12-12 DIAGNOSIS — I82432 Acute embolism and thrombosis of left popliteal vein: Secondary | ICD-10-CM

## 2023-12-12 DIAGNOSIS — Z79811 Long term (current) use of aromatase inhibitors: Secondary | ICD-10-CM | POA: Insufficient documentation

## 2023-12-12 DIAGNOSIS — Z1731 Human epidermal growth factor receptor 2 positive status: Secondary | ICD-10-CM | POA: Diagnosis not present

## 2023-12-12 DIAGNOSIS — Z9221 Personal history of antineoplastic chemotherapy: Secondary | ICD-10-CM | POA: Insufficient documentation

## 2023-12-12 DIAGNOSIS — C50212 Malignant neoplasm of upper-inner quadrant of left female breast: Secondary | ICD-10-CM | POA: Diagnosis not present

## 2023-12-12 DIAGNOSIS — Z1722 Progesterone receptor negative status: Secondary | ICD-10-CM | POA: Insufficient documentation

## 2023-12-12 LAB — CMP (CANCER CENTER ONLY)
ALT: 11 U/L (ref 0–44)
AST: 17 U/L (ref 15–41)
Albumin: 4.4 g/dL (ref 3.5–5.0)
Alkaline Phosphatase: 90 U/L (ref 38–126)
Anion gap: 5 (ref 5–15)
BUN: 17 mg/dL (ref 8–23)
CO2: 29 mmol/L (ref 22–32)
Calcium: 9.7 mg/dL (ref 8.9–10.3)
Chloride: 104 mmol/L (ref 98–111)
Creatinine: 0.71 mg/dL (ref 0.44–1.00)
GFR, Estimated: >60 mL/min (ref >60)
Glucose, Bld: 89 mg/dL (ref 70–99)
Potassium: 4.6 mmol/L (ref 3.5–5.1)
Sodium: 138 mmol/L (ref 135–145)
Total Bilirubin: 0.6 mg/dL (ref 0.0–1.2)
Total Protein: 7 g/dL (ref 6.5–8.1)

## 2023-12-12 NOTE — Progress Notes (Signed)
 [facility] Cancer Follow up:    Merna Huxley, NP [address] [place] [address]   DIAGNOSIS:  Cancer Staging  Malignant neoplasm of upper-inner quadrant of left breast in female, estrogen receptor positive (HCC) Staging form: Breast, AJCC 8th Edition - Pathologic: Stage IIA (pT2, pN0, cM0, G3, ER+, PR-, HER2+) - Signed by Loretha Ash, MD on 11/26/2021 Histologic grading system: 3 grade system   SUMMARY OF ONCOLOGIC HISTORY: Oncology History  Malignant neoplasm of upper-inner quadrant of left breast in female, estrogen receptor positive (HCC)  09/20/2021 Mammogram   Screening mammogram showed extremely dense breasts and a possible mass in the left breast warranting further evaluation.  Breast density category is D Diagnostic mammogram of the breast showed indeterminate mass in the 11:00 location of the left breast.  Indeterminate mass in the lower anterior left axilla.   10/04/2021 Pathology Results   Pathology showed grade 2 invasive ductal carcinoma, left axillary soft tissue needle core biopsy showed benign breast tissue with patchy changes prognostics from the breast tumor showed ER 90% positive strong staining PR 0%, negative, Ki-67 of 30% and HER2 positive for IHC 3+   10/19/2021 Initial Diagnosis   Malignant neoplasm of upper-inner quadrant of left breast in female, estrogen receptor positive (HCC)   10/23/2021 Cancer Staging   Staging form: Breast, AJCC 8th Edition - Pathologic: Stage IIA (pT2, pN0, cM0, G3, ER+, PR-, HER2+) - Signed by Loretha Ash, MD on 11/26/2021 Histologic grading system: 3 grade system   11/10/2021 Genetic Testing   Negative hereditary cancer genetic testing: no pathogenic variants detected in [facility] CancerNext-Expanded +RNA Panel.  Report date is November 10, 2021.   The CancerNext-Expanded gene panel offered by [facility] and includes sequencing, rearrangement, and RNA analysis for the following 77 genes: AIP, ALK, APC,  ATM, AXIN2, BAP1, BARD1, BLM, BMPR1A, BRCA1, BRCA2, BRIP1, CDC73, CDH1, CDK4, CDKN1B, CDKN2A, CHEK2, CTNNA1, DICER1, FANCC, FH, FLCN, GALNT12, KIF1B, LZTR1, MAX, MEN1, MET, MLH1, MSH2, MSH3, MSH6, MUTYH, NBN, NF1, NF2, NTHL1, PALB2, PHOX2B, PMS2, POT1, PRKAR1A, PTCH1, PTEN, RAD51C, RAD51D, RB1, RECQL, RET, SDHA, SDHAF2, SDHB, SDHC, SDHD, SMAD4, SMARCA4, SMARCB1, SMARCE1, STK11, SUFU, TMEM127, TP53, TSC1, TSC2, VHL and XRCC2 (sequencing and deletion/duplication); EGFR, EGLN1, HOXB13, KIT, MITF, PDGFRA, POLD1, and POLE (sequencing only); EPCAM and GREM1 (deletion/duplication only).    11/15/2021 Surgery   Left breast lumpectomy: 2.4 cm grade 3 invasive ductal carcinoma, margins -2 sentinel lymph nodes negative.   12/10/2021 -  Chemotherapy   Patient is on Treatment Plan : BREAST Paclitaxel  + Trastuzumab  q7d / Trastuzumab  q21d     03/26/2022 - 04/22/2022 Radiation Therapy   03/26/2022 through 04/22/2022 Site Technique Total Dose (Gy) Dose per Fx (Gy) Completed Fx Beam Energies  Breast, Left: Breast_L 3D 42.56/42.56 2.66 16/16 6X  Breast, Left: Breast_L_Bst 3D 8/8 2 4/4 6X     05/2022 -  Anti-estrogen oral therapy   Anastrozole  daily     CURRENT THERAPY: Herceptin /Anastrozole   INTERVAL HISTORY:  Discussed the use of AI scribe software for clinical note transcription with the patient, who gave verbal consent to proceed.  History of Present Illness Patricia Marshall is a 78 year old female with antiphospholipid syndrome (APS) and breast cancer who presents for a follow-up regarding her blood disorder and symptom management.  She is currently on Coumadin  for APS and is concerned about its impact on her immunity, especially during colder seasons. She is considering future travel and discussed the possibility of needing a compression sleeve for  long flights based on recommendations from her therapist.  She experiences intermittent knee pain, which she attributes to gout, pseudogout, or arthritis, and  manages with ice packs during flare-ups. She has noticed swelling in her legs, which has improved, although redness persists. She finds chair yoga and aquatic therapy beneficial for her hip flexors and IT band, and is considering trying a 'legs up the wall' yoga pose to help with her symptoms.  She has experienced unusual ear sensations, including a fluttering vibration, which she initially thought was an insect. She also reports occasional plugged ears, which she attributes to changes in weather and aging.  She expresses concerns about undergoing an MRI for mammogram due to claustrophobia and is considering only mammograms at this time.  She has experienced skin bumps, which she attributes to aging, and applies lotion to manage them. 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  Rest of the pertinent 10 point ROS reviewed and neg.   Patient Active Problem List   Diagnosis Date Noted   Acute deep vein thrombosis (DVT) of left lower extremity (HCC) 04/23/2022   Port-A-Cath in place 02/04/2022   Genetic testing 12/07/2021   Malignant neoplasm of upper-inner quadrant of left breast in female, estrogen receptor positive (HCC) 10/19/2021   Piriformis syndrome of left side 07/12/2015   Degenerative arthritis of left knee 07/12/2015   BEE STING REACTION, LOCAL 10/24/2006    is allergic to other, ampicillin, and anesthesia [id number] [propofol ].  MEDICAL HISTORY: Past Medical History:  Diagnosis Date   Allergy 25-30 years ago   Developed a drug fever   Arthritis 2017   In left knee   Breast cancer (HCC)    Chicken pox    High cholesterol    Personal history of chemotherapy    Personal history of radiation therapy    PONV (postoperative nausea and vomiting)    Vitamin D  deficiency     SURGICAL HISTORY: Past Surgical History:  Procedure Laterality Date   APPENDECTOMY  1959   Also tonsils were removed in the 1950s; back  surgery in 2006.   BACK SURGERY     1996   BREAST LUMPECTOMY WITH AXILLARY LYMPH NODE BIOPSY Left 11/15/2021   Procedure: LEFT BREAST LUMPECTOMY WITH AXILLARY SENTINEL LYMPH NODE BIOPSY;  Surgeon: Ebbie Cough, MD;  Location: [facility];  Service: General;  Laterality: Left;   BREAST SURGERY  08.31.2023   Breast cancer   PORTACATH PLACEMENT Right 11/15/2021   Procedure: INSERTION PORT-A-CATH;  Surgeon: Ebbie Cough, MD;  Location: [facility];  Service: General;  Laterality: Right;   SPINE SURGERY  1996   1/3 disc removed   TONSILLECTOMY AND ADENOIDECTOMY  1954    SOCIAL HISTORY: Social History   Socioeconomic History   Marital status: Married    Spouse name: Not on file   Number of children: Not on file   Years of education: Not on file   Highest education level: Master's degree (e.g., MA, MS, MEng, MEd, MSW, MBA)  Occupational History   Not on file  Tobacco Use   Smoking status: Never   Smokeless tobacco: Never  Vaping Use   Vaping status: Never Used  Substance and Sexual Activity   Alcohol use: Yes    Alcohol/week: 1.0 standard drink of alcohol    Types: 1 Glasses of wine per week   Drug use: Never   Sexual activity: Not on file  Other Topics Concern  Not on file  Social History Narrative   Married    Retired    Teacher, 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)   Overall Financial Resource Strain (CARDIA)    Difficulty of Paying Living Expenses: Not hard at all  Food Insecurity: No Food Insecurity (09/21/2023)   Hunger Vital Sign    Worried About Running Out of Food in the Last Year: Never true    Ran Out of Food in the Last Year: Never true  Transportation Needs: No Transportation Needs (09/21/2023)   PRAPARE - Administrator, Civil Service (Medical): No    Lack of Transportation (Non-Medical): No  Physical Activity: Sufficiently Active (09/21/2023)   Exercise Vital Sign    Days of  Exercise per Week: 3 days    Minutes of Exercise per Session: 70 min  Stress: No Stress Concern Present (09/21/2023)   [employer] of Occupational Health - Occupational Stress Questionnaire    Feeling of Stress: Only a little  Social Connections: Moderately Integrated (09/21/2023)   Social Connection and Isolation Panel    Frequency of Communication with Friends and Family: Three times a week    Frequency of Social Gatherings with Friends and Family: Once a week    Attends Religious Services: More than 4 times per year    Active Member of [employer] or Organizations: No    Attends Engineer, structural: Not on file    Marital Status: Married  Catering manager Violence: Not At Risk (09/11/2022)   Humiliation, Afraid, Rape, and Kick questionnaire    Fear of Current or Ex-Partner: No    Emotionally Abused: No    Physically Abused: No    Sexually Abused: No    FAMILY HISTORY: Family History  Problem Relation Age of Onset   Stomach cancer Mother 59   Cancer Mother    Heart attack Father 44   Arthritis Father    Heart disease Father    Varicose Veins Father    Cancer Sister        dx mid 63s; unknown primary w/ mets; treated like ovarian cancer   Heart attack Brother 71   Heart disease Brother    Heart attack Brother 51   Heart disease Brother     PHYSICAL EXAMINATION  BP (!) 147/69 (BP Location: Right Arm, Patient Position: Sitting)   Pulse 60   Temp 98.7 F (37.1 C) (Tympanic)   Resp 18   Ht 5' 5 (1.651 m)   Wt 118 lb 14.4 oz (53.9 kg)   SpO2 98%   BMI 19.79 kg/m   Physical Exam Constitutional:      Appearance: Normal appearance.  Cardiovascular:     Rate and Rhythm: Normal rate and regular rhythm.  Pulmonary:     Effort: Pulmonary effort is normal.     Breath sounds: Normal breath sounds.  Chest:     Comments: Extremely dense breasts. No def palpable masses. No regional adenopathy Musculoskeletal:     Cervical back: Normal range of motion and neck  supple. No rigidity.  Lymphadenopathy:     Cervical: No cervical adenopathy.  Skin:    General: Skin is warm and dry.  Neurological:     General: No focal deficit present.     Mental Status: She is alert.       LABORATORY DATA:  CBC    Component Value Date/Time   WBC 5.6 06/10/2023 1313   RBC 4.70 06/10/2023 1313  HGB 13.9 06/10/2023 1313   HGB 12.1 06/25/2022 0820   HCT 41.4 06/10/2023 1313   HCT 36.8 06/26/2022 1120   PLT 182 06/10/2023 1313   PLT 116 (L) 06/25/2022 0820   MCV 88.1 06/10/2023 1313   MCH 29.6 06/10/2023 1313   MCHC 33.6 06/10/2023 1313   RDW 12.2 06/10/2023 1313   LYMPHSABS 1.7 06/10/2023 1313   MONOABS 0.5 06/10/2023 1313   EOSABS 0.1 06/10/2023 1313   BASOSABS 0.0 06/10/2023 1313    CMP     Component Value Date/Time   NA 141 09/18/2023 0916   K 4.3 09/18/2023 0916   CL 103 09/18/2023 0916   CO2 30 09/18/2023 0916   GLUCOSE 75 09/18/2023 0916   BUN 17 09/18/2023 0916   CREATININE 0.73 09/18/2023 0916   CREATININE 0.67 11/19/2022 0902   CREATININE 0.76 11/03/2019 0921   CALCIUM  10.1 09/18/2023 0916   PROT 7.9 06/18/2023 0844   ALBUMIN 4.9 06/18/2023 0844   AST 18 06/18/2023 0844   AST 16 11/19/2022 0902   ALT 11 06/18/2023 0844   ALT 10 11/19/2022 0902   ALKPHOS 112 06/18/2023 0844   BILITOT 0.8 06/18/2023 0844   BILITOT 0.6 11/19/2022 0902   GFRNONAA >60 11/19/2022 0902   GFRNONAA 78 11/03/2019 0921   GFRAA 91 11/03/2019 0921    ASSESSMENT and THERAPY PLAN:   Assessment and Plan Assessment & Plan Breast cancer on adj anastrozole  Tolerating it well She doesn't want to consider MRI screening despite extremely dense breasts, she is very claustrophobic She will continue annual mammograms No concerns on exam today, dense breasts noted.  Lymphedema, upper extremity, post-breast cancer Upper extremity lymphedema post-breast cancer. Recent SOZO screening indicates low risk for lymphedema at 2%. - Consider compression sleeve for  flights longer than 6 hrs  Antiphospholipid syndrome on anticoagulation Well-managed on Coumadin .  - Continue Coumadin .  Chronic lower extremity edema with erythema Chronic lower extremity edema with erythema, improved with chair yoga and aquatic therapy. Redness persists but swelling has reduced.  Continue to monitor.  Time spent: 40 min  *Total Encounter Time as defined by the Centers for Medicare and Medicaid Services includes, in addition to the face-to-face time of a patient visit (documented in the note above) non-face-to-face time: obtaining and reviewing outside history, ordering and reviewing medications, tests or procedures, care coordination (communications with other health care professionals or caregivers) and documentation in the medical record.

## 2023-12-17 ENCOUNTER — Ambulatory Visit (INDEPENDENT_AMBULATORY_CARE_PROVIDER_SITE_OTHER)

## 2023-12-17 DIAGNOSIS — Z23 Encounter for immunization: Secondary | ICD-10-CM | POA: Diagnosis not present

## 2023-12-24 ENCOUNTER — Ambulatory Visit

## 2023-12-24 DIAGNOSIS — Z7901 Long term (current) use of anticoagulants: Secondary | ICD-10-CM | POA: Diagnosis not present

## 2023-12-24 LAB — POCT INR: INR: 2 (ref 2.0–3.0)

## 2023-12-24 NOTE — Progress Notes (Signed)
 Pt was scheduled for colonoscopy for 7/28 and will be placed on a Lovenox bridge but she has now decided to RS it to a later date.  Continue 2.5 mg daily except take 3.75 mg on Monday, Wednesday and Friday. Recheck in 4 weeks.

## 2023-12-24 NOTE — Patient Instructions (Addendum)
 Pre visit review using our clinic review tool, if applicable. No additional management support is needed unless otherwise documented below in the visit note.  Continue 2.5 mg daily except take 3.75 mg on Monday, Wednesday and Friday. Recheck in 4 weeks.

## 2024-01-08 ENCOUNTER — Other Ambulatory Visit: Payer: Self-pay | Admitting: Hematology and Oncology

## 2024-01-20 ENCOUNTER — Ambulatory Visit: Admitting: Family Medicine

## 2024-01-21 ENCOUNTER — Ambulatory Visit

## 2024-01-21 ENCOUNTER — Telehealth: Payer: Self-pay

## 2024-01-21 DIAGNOSIS — Z7901 Long term (current) use of anticoagulants: Secondary | ICD-10-CM | POA: Diagnosis not present

## 2024-01-21 LAB — POCT INR: INR: 1.7 — AB (ref 2.0–3.0)

## 2024-01-21 NOTE — Progress Notes (Signed)
 Indication: DVT 04/2022, APS Pt has been out of town for the last two weeks. Her diet during this time has not been very consistent. Due to pt being at the very lowest end of her INR range, 2.0, at last visit there will be a small change made to weekly dosing to move INR closer to goal of 2.5. Increase dose today to take 5 mg and then change weekly dose to take 3.75 mg daily except take 2.5 mg on Tuesday, Thursday and Saturday. Recheck in 2 weeks.

## 2024-01-21 NOTE — Progress Notes (Signed)
 I have reviewed and agree with note, evaluation, plan.   Garnette Lukes, MD

## 2024-01-21 NOTE — Patient Instructions (Addendum)
 Pre visit review using our clinic review tool, if applicable. No additional management support is needed unless otherwise documented below in the visit note.  Increase dose today to take 5 mg and then change weekly dose to take 3.75 mg daily except take 2.5 mg on Tuesday, Thursday and Saturday. Recheck in 2 weeks.

## 2024-01-21 NOTE — Telephone Encounter (Signed)
 Patient notified that she did not need to fast and verbalized understanding.

## 2024-01-21 NOTE — Telephone Encounter (Signed)
 Copied from CRM [id number]. Topic: General - Other >> Jan 21, 2024  3:06 PM China J wrote: Reason for CRM: The patient would like if Cory's medical assistant could return her call as soon as possible. She has questions about fasting and did not want to go into further detail with me.  Her appointment is tomorrow at 3:00 PM and she would like a call prior to that appointment.

## 2024-01-22 ENCOUNTER — Ambulatory Visit (INDEPENDENT_AMBULATORY_CARE_PROVIDER_SITE_OTHER): Admitting: Adult Health

## 2024-01-22 ENCOUNTER — Encounter: Payer: Self-pay | Admitting: Adult Health

## 2024-01-22 VITALS — BP 138/80 | HR 59 | Temp 98.1°F | Ht 64.0 in | Wt 124.0 lb

## 2024-01-22 DIAGNOSIS — E782 Mixed hyperlipidemia: Secondary | ICD-10-CM

## 2024-01-22 DIAGNOSIS — E559 Vitamin D deficiency, unspecified: Secondary | ICD-10-CM | POA: Diagnosis not present

## 2024-01-22 DIAGNOSIS — Z0000 Encounter for general adult medical examination without abnormal findings: Secondary | ICD-10-CM | POA: Diagnosis not present

## 2024-01-22 DIAGNOSIS — Z86718 Personal history of other venous thrombosis and embolism: Secondary | ICD-10-CM | POA: Diagnosis not present

## 2024-01-22 DIAGNOSIS — Z170 Estrogen receptor positive status [ER+]: Secondary | ICD-10-CM | POA: Diagnosis not present

## 2024-01-22 DIAGNOSIS — C50212 Malignant neoplasm of upper-inner quadrant of left female breast: Secondary | ICD-10-CM | POA: Diagnosis not present

## 2024-01-22 NOTE — Progress Notes (Signed)
 Subjective:    Patient ID: Patricia Marshall, female    DOB: 1945-05-06, 78 y.o.   MRN: [id number]  HPI  Patient presents for yearly preventative medicine examination. She is a pleasant 78 year old female who  has a past medical history of Allergy (25-30 years ago), Arthritis (2017), Breast cancer (HCC), Chicken pox, High cholesterol, Personal history of chemotherapy, Personal history of radiation therapy, PONV (postoperative nausea and vomiting), and Vitamin D  deficiency.  Breast Cancer -she was diagnosed on July 6th 2023 with INVASIVE DUCTAL CARCINOMA, HER 2 + of left breast.  She had a left breast lumpectomy in August 2023.  Currently managed with Anastrozole . She does reports that she is suffering from multiple joint pain. She would like to go to PT   Vitamin D  Deficiency - not currently taking Vitamin D  Last vitamin D  Lab Results  Component Value Date   VD25OH 35.90 01/15/2023     Hyperlipidemia - managed with lipitor 20 mg twice weekly on Mondays and Fridays  Lab Results  Component Value Date   CHOL 210 (H) 06/18/2023   HDL 74.30 06/18/2023   LDLCALC 120 (H) 06/18/2023   TRIG 74.0 06/18/2023   CHOLHDL 3 06/18/2023    History of DVT  right leg - is on anticoagulation indefinitely with coumadin .    All immunizations and health maintenance protocols were reviewed with the patient and needed orders were placed. Refuses vaccinations.   Appropriate screening laboratory values were ordered for the patient including screening of hyperlipidemia, renal function and hepatic function.  Medication reconciliation,  past medical history, social history, problem list and allergies were reviewed in detail with the patient  Goals were established with regard to weight loss, exercise, and  diet in compliance with medications  Wt Readings from Last 3 Encounters:  01/22/24 124 lb (56.2 kg)  12/12/23 118 lb 14.4 oz (53.9 kg)  12/08/23 118 lb 8 oz (53.8 kg)   She is having her  diagnostic mammogram at the end of this month. She does not want to have any more colonoscopies done as she does not want to do a Lovenox bridge    Review of Systems  Constitutional: Negative.   HENT: Negative.    Eyes: Negative.   Respiratory: Negative.    Cardiovascular: Negative.   Gastrointestinal: Negative.   Endocrine: Negative.   Genitourinary: Negative.   Musculoskeletal: Negative.   Skin: Negative.   Allergic/Immunologic: Negative.   Neurological: Negative.   Hematological: Negative.   Psychiatric/Behavioral: Negative.     Past Medical History:  Diagnosis Date   Allergy 25-30 years ago   Developed a drug fever   Arthritis 2017   In left knee   Breast cancer (HCC)    Chicken pox    High cholesterol    Personal history of chemotherapy    Personal history of radiation therapy    PONV (postoperative nausea and vomiting)    Vitamin D  deficiency     Social History   Socioeconomic History   Marital status: Married    Spouse name: Not on file   Number of children: Not on file   Years of education: Not on file   Highest education level: Master's degree (e.g., MA, MS, MEng, MEd, MSW, MBA)  Occupational History   Not on file  Tobacco Use   Smoking status: Never   Smokeless tobacco: Never  Vaping Use   Vaping status: Never Used  Substance and Sexual Activity   Alcohol use: Yes  Alcohol/week: 1.0 standard drink of alcohol    Types: 1 Glasses of wine per week   Drug use: Never   Sexual activity: Not on file  Other Topics Concern   Not on file  Social History Narrative   Married    Retired    Social Drivers of Corporate Investment Banker Strain: Low Risk  (09/21/2023)   Overall Financial Resource Strain (CARDIA)    Difficulty of Paying Living Expenses: Not hard at all  Food Insecurity: No Food Insecurity (09/21/2023)   Hunger Vital Sign    Worried About Running Out of Food in the Last Year: Never true    Ran Out of Food in the Last Year: Never true   Transportation Needs: No Transportation Needs (09/21/2023)   PRAPARE - Administrator, Civil Service (Medical): No    Lack of Transportation (Non-Medical): No  Physical Activity: Sufficiently Active (09/21/2023)   Exercise Vital Sign    Days of Exercise per Week: 3 days    Minutes of Exercise per Session: 70 min  Stress: No Stress Concern Present (09/21/2023)   [employer] of Occupational Health - Occupational Stress Questionnaire    Feeling of Stress: Only a little  Social Connections: Moderately Integrated (09/21/2023)   Social Connection and Isolation Panel    Frequency of Communication with Friends and Family: Three times a week    Frequency of Social Gatherings with Friends and Family: Once a week    Attends Religious Services: More than 4 times per year    Active Member of [employer] or Organizations: No    Attends Engineer, Structural: Not on file    Marital Status: Married  Catering Manager Violence: Not At Risk (09/11/2022)   Humiliation, Afraid, Rape, and Kick questionnaire    Fear of Current or Ex-Partner: No    Emotionally Abused: No    Physically Abused: No    Sexually Abused: No    Past Surgical History:  Procedure Laterality Date   APPENDECTOMY  1959   Also tonsils were removed in the 1950s; back surgery in 2006.   BACK SURGERY     1996   BREAST LUMPECTOMY WITH AXILLARY LYMPH NODE BIOPSY Left 11/15/2021   Procedure: LEFT BREAST LUMPECTOMY WITH AXILLARY SENTINEL LYMPH NODE BIOPSY;  Surgeon: Ebbie Cough, MD;  Location: [facility];  Service: General;  Laterality: Left;   BREAST SURGERY  08.31.2023   Breast cancer   PORTACATH PLACEMENT Right 11/15/2021   Procedure: INSERTION PORT-A-CATH;  Surgeon: Ebbie Cough, MD;  Location: [facility];  Service: General;  Laterality: Right;   SPINE SURGERY  1996   1/3 disc removed   TONSILLECTOMY AND ADENOIDECTOMY  1954    Family History  Problem Relation Age of Onset    Stomach cancer Mother 74   Cancer Mother    Heart attack Father 86   Arthritis Father    Heart disease Father    Varicose Veins Father    Cancer Sister        dx mid 61s; unknown primary w/ mets; treated like ovarian cancer   Heart attack Brother 64   Heart disease Brother    Heart attack Brother 70   Heart disease Brother     Allergies  Allergen Reactions   Other Other (See Comments)    SPICEY FOODS- gagging,cough,burning sensation   Ampicillin Other (See Comments)    Drug fever Did it involve swelling of the face/tongue/throat, SOB, or low BP?  No Did it involve sudden or severe rash/hives, skin peeling, or any reaction on the inside of your mouth or nose? No Did you need to seek medical attention at a hospital or doctor's office? No When did it last happen? Over 10 years ago If all above answers are NO, may proceed with cephalosporin use.   Anesthesia S-I-40 [Propofol ] Nausea And Vomiting    Current Outpatient Medications on File Prior to Visit  Medication Sig Dispense Refill   anastrozole  (ARIMIDEX ) 1 MG tablet Take 1 tablet by mouth once daily 90 tablet 0   atorvastatin  (LIPITOR) 20 MG tablet Take 1 tab a week     Cholecalciferol (VITAMIN D ) 50 MCG (2000 UT) tablet Take 2,000 Units by mouth daily.     Multiple Vitamin (MULTIVITAMIN) capsule Take 1 capsule by mouth daily.     phytonadione  (VITAMIN K) 5 MG tablet TAKE 1/2 TABLET BY MOUTH AS DIRECTED BY ANTICOAGULATION CLINIC. KEEP THE OTHER HALF IN A SAFE PLACE. 1 tablet 0   warfarin (COUMADIN ) 2.5 MG tablet TAKE 1 TABLET (2.5 MG) BY MOUTH DAILY EXCEPT TAKE 1 1/2 TABLETS (3.75 MG) ON MONDAY, WEDNESDAY AND FRIDAY OR AS DIRECTED BY ANTICOAGULATION CLINIC 135 tablet 1   No current facility-administered medications on file prior to visit.    BP 138/80   Pulse (!) 59   Temp 98.1 F (36.7 C) (Oral)   Ht 5' 4 (1.626 m)   Wt 124 lb (56.2 kg)   SpO2 98%   BMI 21.28 kg/m        Objective:   Physical Exam Vitals  and nursing note reviewed.  Constitutional:      General: She is not in acute distress.    Appearance: Normal appearance. She is not ill-appearing.  HENT:     Head: Normocephalic and atraumatic.     Right Ear: Tympanic membrane, ear canal and external ear normal. There is no impacted cerumen.     Left Ear: Tympanic membrane, ear canal and external ear normal. There is no impacted cerumen.     Nose: Nose normal. No congestion or rhinorrhea.     Mouth/Throat:     Mouth: Mucous membranes are moist.     Pharynx: Oropharynx is clear.  Eyes:     Extraocular Movements: Extraocular movements intact.     Conjunctiva/sclera: Conjunctivae normal.     Pupils: Pupils are equal, round, and reactive to light.  Neck:     Vascular: No carotid bruit.  Cardiovascular:     Rate and Rhythm: Normal rate and regular rhythm.     Pulses: Normal pulses.     Heart sounds: No murmur heard.    No friction rub. No gallop.  Pulmonary:     Effort: Pulmonary effort is normal.     Breath sounds: Normal breath sounds.  Abdominal:     General: Abdomen is flat. Bowel sounds are normal. There is no distension.     Palpations: Abdomen is soft. There is no mass.     Tenderness: There is no abdominal tenderness. There is no guarding or rebound.     Hernia: No hernia is present.  Musculoskeletal:        General: Normal range of motion.     Cervical back: Normal range of motion and neck supple.  Lymphadenopathy:     Cervical: No cervical adenopathy.  Skin:    General: Skin is warm and dry.     Capillary Refill: Capillary refill takes less than 2 seconds.  Neurological:  General: No focal deficit present.     Mental Status: She is alert and oriented to person, place, and time.  Psychiatric:        Mood and Affect: Mood normal.        Behavior: Behavior normal.        Thought Content: Thought content normal.        Judgment: Judgment normal.       Assessment & Plan:   1. Routine general medical examination  at a health care facility (Primary) Today patient counseled on age appropriate routine health concerns for screening and prevention, each reviewed and up to date or declined. Immunizations reviewed and up to date or declined. Labs ordered and reviewed. Risk factors for depression reviewed and negative. Hearing function and visual acuity are intact. ADLs screened and addressed as needed. Functional ability and level of safety reviewed and appropriate. Education, counseling and referrals performed based on assessed risks today. Patient provided with a copy of personalized plan for preventive services. - Continue to stay active and eat healthy  - Follow up in one year or sooner if needed  2. Malignant neoplasm of upper-inner quadrant of left breast in female, estrogen receptor positive (HCC) - Per oncology  - CBC with Differential/Platelet; Future - Comprehensive metabolic panel with GFR; Future - Lipid panel; Future - TSH; Future  3. Vitamin D  deficiency  - VITAMIN D  25 Hydroxy (Vit-D Deficiency, Fractures); Future  4. Mixed hyperlipidemia - Continue with statin twice weekly.  - CBC with Differential/Platelet; Future - Comprehensive metabolic panel with GFR; Future - Lipid panel; Future - TSH; Future  5. History of DVT (deep vein thrombosis) - Follow up in with coumadin  clinic as directed - CBC with Differential/Platelet; Future - Comprehensive metabolic panel with GFR; Future - Lipid panel; Future - TSH; Future   Darleene Shape, NP

## 2024-01-22 NOTE — Patient Instructions (Signed)
 It was great seeing you today   We will follow up with you regarding your lab work   Please let me know if you need anything

## 2024-01-23 ENCOUNTER — Ambulatory Visit: Payer: Self-pay | Admitting: Adult Health

## 2024-01-23 ENCOUNTER — Encounter: Payer: Self-pay | Admitting: Hematology and Oncology

## 2024-01-23 LAB — CBC WITH DIFFERENTIAL/PLATELET
Basophils Absolute: 0.1 K/uL (ref 0.0–0.1)
Basophils Relative: 0.9 % (ref 0.0–3.0)
Eosinophils Absolute: 0 K/uL (ref 0.0–0.7)
Eosinophils Relative: 0.5 % (ref 0.0–5.0)
HCT: 43.5 % (ref 36.0–46.0)
Hemoglobin: 14.4 g/dL (ref 12.0–15.0)
Lymphocytes Relative: 33.9 % (ref 12.0–46.0)
Lymphs Abs: 2.4 K/uL (ref 0.7–4.0)
MCHC: 33 g/dL (ref 30.0–36.0)
MCV: 90.2 fl (ref 78.0–100.0)
Monocytes Absolute: 0.6 K/uL (ref 0.1–1.0)
Monocytes Relative: 8.4 % (ref 3.0–12.0)
Neutro Abs: 3.9 K/uL (ref 1.4–7.7)
Neutrophils Relative %: 56.3 % (ref 43.0–77.0)
Platelets: 202 K/uL (ref 150.0–400.0)
RBC: 4.82 Mil/uL (ref 3.87–5.11)
RDW: 12.9 % (ref 11.5–15.5)
WBC: 7 K/uL (ref 4.0–10.5)

## 2024-01-23 LAB — LIPID PANEL
Cholesterol: 226 mg/dL — ABNORMAL HIGH (ref 0–200)
HDL: 77.1 mg/dL
LDL Cholesterol: 134 mg/dL — ABNORMAL HIGH (ref 0–99)
NonHDL: 148.99
Total CHOL/HDL Ratio: 3
Triglycerides: 74 mg/dL (ref 0.0–149.0)
VLDL: 14.8 mg/dL (ref 0.0–40.0)

## 2024-01-23 LAB — COMPREHENSIVE METABOLIC PANEL WITH GFR
ALT: 17 U/L (ref 0–35)
AST: 23 U/L (ref 0–37)
Albumin: 5 g/dL (ref 3.5–5.2)
Alkaline Phosphatase: 98 U/L (ref 39–117)
BUN: 18 mg/dL (ref 6–23)
CO2: 25 meq/L (ref 19–32)
Calcium: 10.1 mg/dL (ref 8.4–10.5)
Chloride: 100 meq/L (ref 96–112)
Creatinine, Ser: 0.65 mg/dL (ref 0.40–1.20)
GFR: 85.12 mL/min (ref >60.00)
Glucose, Bld: 68 mg/dL — ABNORMAL LOW (ref 70–99)
Potassium: 4.2 meq/L (ref 3.5–5.1)
Sodium: 138 meq/L (ref 135–145)
Total Bilirubin: 0.6 mg/dL (ref 0.2–1.2)
Total Protein: 8 g/dL (ref 6.0–8.3)

## 2024-01-23 LAB — TSH: TSH: 1.84 u[IU]/mL (ref 0.35–5.50)

## 2024-01-23 LAB — VITAMIN D 25 HYDROXY (VIT D DEFICIENCY, FRACTURES): VITD: 37.36 ng/mL (ref 30.00–100.00)

## 2024-01-26 ENCOUNTER — Telehealth: Payer: Self-pay

## 2024-01-26 ENCOUNTER — Other Ambulatory Visit: Payer: Self-pay | Admitting: Adult Health

## 2024-01-26 ENCOUNTER — Encounter

## 2024-01-26 NOTE — Telephone Encounter (Signed)
 Copied from CRM [id number]. Topic: Clinical - Medical Advice >> Jan 26, 2024 11:03 AM Nessti S wrote: Reason for CRM: pt asked is she able to take lipitor Monday, Wednesday and Friday? Call back number [phone]

## 2024-01-26 NOTE — Telephone Encounter (Unsigned)
 Copied from CRM #[id number]. Topic: Clinical - Medication Refill >> Jan 26, 2024 11:05 AM Nessti S wrote: Medication: atorvastatin  (LIPITOR) 20 MG tablet  Has the patient contacted their pharmacy? No (Agent: If no, request that the patient contact the pharmacy for the refill. If patient does not wish to contact the pharmacy document the reason why and proceed with request.) (Agent: If yes, when and what did the pharmacy advise?)  This is the patient's preferred pharmacy:  [facility] [address], [place] - [address] [place] [address] Phone: [phone] Fax: [phone]  Is this the correct pharmacy for this prescription? Yes If no, delete pharmacy and type the correct one.   Has the prescription been filled recently? No  Is the patient out of the medication? No. Has 6 left  Has the patient been seen for an appointment in the last year OR does the patient have an upcoming appointment? Yes  Can we respond through MyChart? Yes  Agent: Please be advised that Rx refills may take up to 3 business days. We ask that you follow-up with your pharmacy.

## 2024-01-27 ENCOUNTER — Other Ambulatory Visit: Payer: Self-pay

## 2024-01-27 MED ORDER — ATORVASTATIN CALCIUM 20 MG PO TABS: 20.0000 mg | ORAL_TABLET | ORAL | 2 refills | Status: AC

## 2024-01-27 NOTE — Telephone Encounter (Signed)
 Patient notified to take the medication 3x a week. Pt verbalized understanding.

## 2024-02-04 ENCOUNTER — Ambulatory Visit

## 2024-02-04 DIAGNOSIS — Z7901 Long term (current) use of anticoagulants: Secondary | ICD-10-CM

## 2024-02-04 LAB — POCT INR: INR: 2.2 (ref 2.0–3.0)

## 2024-02-04 NOTE — Progress Notes (Signed)
 Indication: DVT 04/2022, APS Continue 3.75 mg daily except take 2.5 mg on Tuesday, Thursday and Saturday. Recheck in 4 weeks.

## 2024-02-04 NOTE — Progress Notes (Signed)
 I have reviewed and agree with note, evaluation, plan.   Garnette Lukes, MD

## 2024-02-04 NOTE — Patient Instructions (Addendum)
 Pre visit review using our clinic review tool, if applicable. No additional management support is needed unless otherwise documented below in the visit note.  Continue 3.75 mg daily except take 2.5 mg on Tuesday, Thursday and Saturday. Recheck in 4 weeks.

## 2024-02-10 ENCOUNTER — Ambulatory Visit
Admission: RE | Admit: 2024-02-10 | Discharge: 2024-02-10 | Disposition: A | Source: Ambulatory Visit | Attending: Hematology and Oncology

## 2024-02-10 ENCOUNTER — Ambulatory Visit

## 2024-02-10 VITALS — Ht 64.0 in | Wt 124.0 lb

## 2024-02-10 DIAGNOSIS — Z0000 Encounter for general adult medical examination without abnormal findings: Secondary | ICD-10-CM | POA: Diagnosis not present

## 2024-02-10 DIAGNOSIS — C50212 Malignant neoplasm of upper-inner quadrant of left female breast: Secondary | ICD-10-CM

## 2024-02-10 DIAGNOSIS — R928 Other abnormal and inconclusive findings on diagnostic imaging of breast: Secondary | ICD-10-CM | POA: Diagnosis not present

## 2024-02-10 NOTE — Progress Notes (Deleted)
 Chief Complaint  Patient presents with   Medicare Wellness     Subjective:   Patricia Marshall is a 78 y.o. female who presents for a Medicare Annual Wellness Visit.  Allergies (verified) Other, Ampicillin, and Anesthesia s-i-40 [propofol ]   History: Past Medical History:  Diagnosis Date   Allergy 25-30 years ago   Developed a drug fever   Arthritis 2017   In left knee   Breast cancer (HCC)    Chicken pox    High cholesterol    Personal history of chemotherapy    Personal history of radiation therapy    PONV (postoperative nausea and vomiting)    Vitamin D  deficiency    Past Surgical History:  Procedure Laterality Date   APPENDECTOMY  1959   Also tonsils were removed in the 1950s; back surgery in 2006.   BACK SURGERY     1996   BREAST LUMPECTOMY WITH AXILLARY LYMPH NODE BIOPSY Left 11/15/2021   Procedure: LEFT BREAST LUMPECTOMY WITH AXILLARY SENTINEL LYMPH NODE BIOPSY;  Surgeon: Ebbie Cough, MD;  Location: [facility];  Service: General;  Laterality: Left;   BREAST SURGERY  08.31.2023   Breast cancer   PORTACATH PLACEMENT Right 11/15/2021   Procedure: INSERTION PORT-A-CATH;  Surgeon: Ebbie Cough, MD;  Location: [facility];  Service: General;  Laterality: Right;   SPINE SURGERY  1996   1/3 disc removed   TONSILLECTOMY AND ADENOIDECTOMY  1954   Family History  Problem Relation Age of Onset   Stomach cancer Mother 20   Cancer Mother    Heart attack Father 76   Arthritis Father    Heart disease Father    Varicose Veins Father    Cancer Sister        dx mid 47s; unknown primary w/ mets; treated like ovarian cancer   Heart attack Brother 6   Heart disease Brother    Heart attack Brother 72   Heart disease Brother    Social History   Occupational History   Occupation: RETIRED  Tobacco Use   Smoking status: Never   Smokeless tobacco: Never  Vaping Use   Vaping status: Never Used  Substance and Sexual  Activity   Alcohol use: Yes    Alcohol/week: 1.0 standard drink of alcohol    Types: 1 Glasses of wine per week   Drug use: Never   Sexual activity: Not on file   Tobacco Counseling Counseling given: Not Answered  SDOH Screenings   Food Insecurity: No Food Insecurity (02/10/2024)  Housing: Unknown (02/10/2024)  Transportation Needs: No Transportation Needs (02/10/2024)  Utilities: Not At Risk (02/10/2024)  Alcohol Screen: Low Risk  (06/17/2023)  Depression (PHQ2-9): Low Risk  (02/10/2024)  Financial Resource Strain: Low Risk  (09/21/2023)  Physical Activity: Sufficiently Active (02/10/2024)  Social Connections: Socially Integrated (02/10/2024)  Stress: No Stress Concern Present (02/10/2024)  Tobacco Use: Low Risk  (02/10/2024)  Health Literacy: Adequate Health Literacy (02/10/2024)   See flowsheets for full screening details  Depression Screen PHQ 2 & 9 Depression Scale- Over the past 2 weeks, how often have you been bothered by any of the following problems? Little interest or pleasure in doing things: 0 Feeling down, depressed, or hopeless (PHQ Adolescent also includes...irritable): 0 PHQ-2 Total Score: 0 Trouble falling or staying asleep, or sleeping too much: 0 Feeling tired or having little energy: 1 Poor appetite or overeating (PHQ Adolescent also includes...weight loss): 0 Feeling bad about yourself - or that you are  a failure or have let yourself or your family down: 0 Trouble concentrating on things, such as reading the newspaper or watching television (PHQ Adolescent also includes...like school work): 0 Moving or speaking so slowly that other people could have noticed. Or the opposite - being so fidgety or restless that you have been moving around a lot more than usual: 0 Thoughts that you would be better off dead, or of hurting yourself in some way: 0 PHQ-9 Total Score: 1 If you checked off any problems, how difficult have these problems made it for you to do your work,  take care of things at home, or get along with other people?: Not difficult at all  Depression Treatment Depression Interventions/Treatment : EYV7-0 Score <4 Follow-up Not Indicated     Goals Addressed   None    Visit info / Clinical Intake: Medicare Wellness Visit Type:: Subsequent Annual Wellness Visit Persons participating in visit:: patient Medicare Wellness Visit Mode:: Video Because this visit was a virtual/telehealth visit:: vitals recorded from last visit If Telephone or Video please confirm:: I connected with the patient using audio enabled telemedicine application and verified that I am speaking with the correct person using two identifiers; I discussed the limitations of evaluation and management by telemedicine; The patient expressed understanding and agreed to proceed Patient Location:: Home Provider Location:: Home Information given by:: patient Interpreter Needed?: No Pre-visit prep was completed: yes AWV questionnaire completed by patient prior to visit?: yes Date:: 02/10/24 Living arrangements:: lives with spouse/significant other Patient's Overall Health Status Rating: very good Typical amount of pain: none Does pain affect daily life?: no Are you currently prescribed opioids?: no  Dietary Habits and Nutritional Risks How many meals a day?: 3 Eats fruit and vegetables daily?: yes Most meals are obtained by: preparing own meals In the last 2 weeks, have you had any of the following?: none Diabetic:: no  Functional Status Activities of Daily Living (to include ambulation/medication): (Patient-Rptd) Independent Ambulation: Independent with device- listed below Home Assistive Devices/Equipment: Eyeglasses Medication Administration: Independent Home Management: (Patient-Rptd) Independent Manage your own finances?: yes Primary transportation is: driving Concerns about vision?: (!) yes (night vision not so good) Concerns about hearing?: no  Fall  Screening Falls in the past year?: (Patient-Rptd) 0 Number of falls in past year: (Patient-Rptd) 0 Was there an injury with Fall?: (Patient-Rptd) 0 Fall Risk Category Calculator: (Patient-Rptd) 0 Patient Fall Risk Level: (Patient-Rptd) Low Fall Risk  Fall Risk Patient at Risk for Falls Due to: No Fall Risks Fall risk Follow up: Falls evaluation completed; Falls prevention discussed  Home and Transportation Safety: All rugs have non-skid backing?: N/A, no rugs All stairs or steps have railings?: yes Grab bars in the bathtub or shower?: (!) no Have non-skid surface in bathtub or shower?: (!) no Good home lighting?: yes Regular seat belt use?: yes Hospital stays in the last year:: no  Cognitive Assessment Difficulty concentrating, remembering, or making decisions? : no Will 6CIT or Mini Cog be Completed: no  Advance Directives (For Healthcare) Does Patient Have a Medical Advance Directive?: Yes Type of Advance Directive: Healthcare Power of [place]; Living will; Out of facility DNR (pink MOST or yellow form)  Reviewed/Updated  Reviewed/Updated: Reviewed All (Medical, Surgical, Family, Medications, Allergies, Care Teams, Patient Goals)        Objective:    Today's Vitals   02/10/24 1519  Weight: 124 lb (56.2 kg)  Height: 5' 4 (1.626 m)   Body mass index is 21.28 kg/m.  Current Medications (  verified) Outpatient Encounter Medications as of 02/10/2024  Medication Sig   anastrozole  (ARIMIDEX ) 1 MG tablet Take 1 tablet by mouth once daily   atorvastatin  (LIPITOR) 20 MG tablet Take 1 tablet (20 mg total) by mouth 3 (three) times a week.   Multiple Vitamin (MULTIVITAMIN) capsule Take 1 capsule by mouth daily.   phytonadione  (VITAMIN K) 5 MG tablet TAKE 1/2 TABLET BY MOUTH AS DIRECTED BY ANTICOAGULATION CLINIC. KEEP THE OTHER HALF IN A SAFE PLACE.   warfarin (COUMADIN ) 2.5 MG tablet TAKE 1 TABLET (2.5 MG) BY MOUTH DAILY EXCEPT TAKE 1 1/2 TABLETS (3.75 MG) ON MONDAY, WEDNESDAY  AND FRIDAY OR AS DIRECTED BY ANTICOAGULATION CLINIC   No facility-administered encounter medications on file as of 02/10/2024.   Hearing/Vision screen Hearing Screening - Comments:: Denies hearing difficulties   Vision Screening - Comments:: Wears eyeglasses/UTD/Dr. Cleotilde Immunizations and Health Maintenance Health Maintenance  Topic Date Due   Medicare Annual Wellness (AWV)  09/11/2023   COVID-19 Vaccine (4 - 2025-26 season) 11/17/2023   Zoster Vaccines- Shingrix (1 of 2) 04/23/2024 (Originally 12/20/1964)   DTaP/Tdap/Td (1 - Tdap) 01/21/2025 (Originally 12/20/1964)   Pneumococcal Vaccine: 50+ Years (1 of 2 - PCV) 01/21/2025 (Originally 12/20/1964)   Bone Density Scan  05/19/2024   Mammogram  02/09/2025   Influenza Vaccine  Completed   Hepatitis C Screening  Completed   Meningococcal B Vaccine  Aged Out   Colonoscopy  Discontinued        Assessment/Plan:  This is a routine wellness examination for [facility].  Patient Care Team: Merna Huxley, NP as PCP - General (Family Medicine) Ebbie Cough, MD as Consulting Physician (General Surgery) Loretha Ash, MD as Consulting Physician (Hematology and Oncology) Dewey Rush, MD as Consulting Physician (Radiation Oncology) Cleotilde Sewer, OD (Optometry)  I have personally reviewed and noted the following in the patient's chart:   Medical and social history Use of alcohol, tobacco or illicit drugs  Current medications and supplements including opioid prescriptions. Functional ability and status Nutritional status Physical activity Advanced directives List of other physicians Hospitalizations, surgeries, and ER visits in previous 12 months Vitals Screenings to include cognitive, depression, and falls Referrals and appointments  No orders of the defined types were placed in this encounter.  In addition, I have reviewed and discussed with patient certain preventive protocols, quality metrics, and best practice  recommendations. A written personalized care plan for preventive services as well as general preventive health recommendations were provided to patient.   Patricia Marshall L Lisha Vitale, CMA   02/10/2024   No follow-ups on file.  After Visit Summary: (MyChart) Due to this being a telephonic visit, the after visit summary with patients personalized plan was offered to patient via MyChart   Nurse Notes: Patient declines any due vaccines.  Patient is up to date on all other health maintenance with no concerns to address today.

## 2024-02-10 NOTE — Patient Instructions (Addendum)
 [name],  Thank you for taking the time for your Medicare Wellness Visit. I appreciate your continued commitment to your health goals. Please review the care plan we discussed, and feel free to reach out if I can assist you further.  Please note that Annual Wellness Visits do not include a physical exam. Some assessments may be limited, especially if the visit was conducted virtually. If needed, we may recommend an in-person follow-up with your provider.  Ongoing Care Seeing your primary care provider every 3 to 6 months helps us  monitor your health and provide consistent, personalized care. Last office visit on 01/22/2024.  Keep up the good work and Happy Holidays to you and your family.  Referrals If a referral was made during today's visit and you haven't received any updates within two weeks, please contact the referred provider directly to check on the status.  Recommended Screenings:  Health Maintenance  Topic Date Due   COVID-19 Vaccine (4 - 2025-26 season) 11/17/2023   Zoster (Shingles) Vaccine (1 of 2) 04/23/2024*   DTaP/Tdap/Td vaccine (1 - Tdap) 01/21/2025*   Pneumococcal Vaccine for age over 36 (1 of 2 - PCV) 01/21/2025*   Osteoporosis screening with Bone Density Scan  05/19/2024   Breast Cancer Screening  02/09/2025   Medicare Annual Wellness Visit  02/09/2025   Flu Shot  Completed   Hepatitis C Screening  Completed   Meningitis B Vaccine  Aged Out   Colon Cancer Screening  Discontinued  *Topic was postponed. The date shown is not the original due date.       02/10/2024    9:58 AM  Advanced Directives  Does Patient Have a Medical Advance Directive? Yes  Type of Estate Agent of [place];Living will;Out of facility DNR (pink MOST or yellow form)    Vision: Annual vision screenings are recommended for early detection of glaucoma, cataracts, and diabetic retinopathy. These exams can also reveal signs of chronic conditions such as diabetes and  high blood pressure.  Dental: Annual dental screenings help detect early signs of oral cancer, gum disease, and other conditions linked to overall health, including heart disease and diabetes.  Please see the attached documents for additional preventive care recommendations.

## 2024-02-16 NOTE — Progress Notes (Signed)
 Chief Complaint  Patient presents with   Medicare Wellness     Subjective:   Patricia Marshall is a 78 y.o. female who presents for a Medicare Annual Wellness Visit.  Allergies (verified) Other, Ampicillin, and Anesthesia s-i-40 [propofol ]   History: Past Medical History:  Diagnosis Date   Allergy 25-30 years ago   Developed a drug fever   Arthritis 2017   In left knee   Breast cancer (HCC)    Chicken pox    High cholesterol    Personal history of chemotherapy    Personal history of radiation therapy    PONV (postoperative nausea and vomiting)    Vitamin D  deficiency    Past Surgical History:  Procedure Laterality Date   APPENDECTOMY  1959   Also tonsils were removed in the 1950s; back surgery in 2006.   BACK SURGERY     1996   BREAST LUMPECTOMY WITH AXILLARY LYMPH NODE BIOPSY Left 11/15/2021   Procedure: LEFT BREAST LUMPECTOMY WITH AXILLARY SENTINEL LYMPH NODE BIOPSY;  Surgeon: Ebbie Cough, MD;  Location: [facility];  Service: General;  Laterality: Left;   BREAST SURGERY  08.31.2023   Breast cancer   PORTACATH PLACEMENT Right 11/15/2021   Procedure: INSERTION PORT-A-CATH;  Surgeon: Ebbie Cough, MD;  Location: [facility];  Service: General;  Laterality: Right;   SPINE SURGERY  1996   1/3 disc removed   TONSILLECTOMY AND ADENOIDECTOMY  1954   Family History  Problem Relation Age of Onset   Stomach cancer Mother 70   Cancer Mother    Heart attack Father 67   Arthritis Father    Heart disease Father    Varicose Veins Father    Cancer Sister        dx mid 42s; unknown primary w/ mets; treated like ovarian cancer   Heart attack Brother 73   Heart disease Brother    Heart attack Brother 80   Heart disease Brother    Social History   Occupational History   Occupation: RETIRED  Tobacco Use   Smoking status: Never   Smokeless tobacco: Never  Vaping Use   Vaping status: Never Used  Substance and Sexual  Activity   Alcohol use: Yes    Alcohol/week: 1.0 standard drink of alcohol    Types: 1 Glasses of wine per week   Drug use: Never   Sexual activity: Not on file   Tobacco Counseling Counseling given: Not Answered  SDOH Screenings   Food Insecurity: No Food Insecurity (02/10/2024)  Housing: Unknown (02/10/2024)  Transportation Needs: No Transportation Needs (02/10/2024)  Utilities: Not At Risk (02/10/2024)  Alcohol Screen: Low Risk  (06/17/2023)  Depression (PHQ2-9): Low Risk  (02/10/2024)  Financial Resource Strain: Low Risk  (09/21/2023)  Physical Activity: Sufficiently Active (02/10/2024)  Social Connections: Socially Integrated (02/10/2024)  Stress: No Stress Concern Present (02/10/2024)  Tobacco Use: Low Risk  (02/10/2024)  Health Literacy: Adequate Health Literacy (02/10/2024)   See flowsheets for full screening details  Depression Screen PHQ 2 & 9 Depression Scale- Over the past 2 weeks, how often have you been bothered by any of the following problems? Little interest or pleasure in doing things: 0 Feeling down, depressed, or hopeless (PHQ Adolescent also includes...irritable): 0 PHQ-2 Total Score: 0 Trouble falling or staying asleep, or sleeping too much: 0 Feeling tired or having little energy: 1 Poor appetite or overeating (PHQ Adolescent also includes...weight loss): 0 Feeling bad about yourself - or that you are  a failure or have let yourself or your family down: 0 Trouble concentrating on things, such as reading the newspaper or watching television (PHQ Adolescent also includes...like school work): 0 Moving or speaking so slowly that other people could have noticed. Or the opposite - being so fidgety or restless that you have been moving around a lot more than usual: 0 Thoughts that you would be better off dead, or of hurting yourself in some way: 0 PHQ-9 Total Score: 1 If you checked off any problems, how difficult have these problems made it for you to do your work,  take care of things at home, or get along with other people?: Not difficult at all  Depression Treatment Depression Interventions/Treatment : EYV7-0 Score <4 Follow-up Not Indicated     Goals Addressed   None    Visit info / Clinical Intake: Medicare Wellness Visit Type:: Subsequent Annual Wellness Visit Persons participating in visit:: patient Medicare Wellness Visit Mode:: Video Because this visit was a virtual/telehealth visit:: vitals recorded from last visit (b/p not supplied due to pt's lack of equipment) If Telephone or Video please confirm:: I connected with the patient using audio enabled telemedicine application and verified that I am speaking with the correct person using two identifiers; I discussed the limitations of evaluation and management by telemedicine; The patient expressed understanding and agreed to proceed Patient Location:: Home Provider Location:: Home Information given by:: patient Interpreter Needed?: No Pre-visit prep was completed: yes AWV questionnaire completed by patient prior to visit?: yes Date:: 02/10/24 Living arrangements:: lives with spouse/significant other Patient's Overall Health Status Rating: very good Typical amount of pain: none Does pain affect daily life?: no Are you currently prescribed opioids?: no  Dietary Habits and Nutritional Risks How many meals a day?: 3 Eats fruit and vegetables daily?: yes Most meals are obtained by: preparing own meals In the last 2 weeks, have you had any of the following?: none Diabetic:: no  Functional Status Activities of Daily Living (to include ambulation/medication): (Patient-Rptd) Independent Ambulation: Independent with device- listed below Home Assistive Devices/Equipment: Eyeglasses Medication Administration: Independent Home Management: (Patient-Rptd) Independent Manage your own finances?: yes Primary transportation is: driving Concerns about vision?: (!) yes (night vision not so  good) Concerns about hearing?: no  Fall Screening Falls in the past year?: (Patient-Rptd) 0 Number of falls in past year: (Patient-Rptd) 0 Was there an injury with Fall?: (Patient-Rptd) 0 Fall Risk Category Calculator: (Patient-Rptd) 0 Patient Fall Risk Level: (Patient-Rptd) Low Fall Risk  Fall Risk Patient at Risk for Falls Due to: No Fall Risks Fall risk Follow up: Falls evaluation completed; Falls prevention discussed  Home and Transportation Safety: All rugs have non-skid backing?: N/A, no rugs All stairs or steps have railings?: yes Grab bars in the bathtub or shower?: (!) no Have non-skid surface in bathtub or shower?: (!) no Good home lighting?: yes Regular seat belt use?: yes Hospital stays in the last year:: no  Cognitive Assessment Difficulty concentrating, remembering, or making decisions? : no Will 6CIT or Mini Cog be Completed: no  Advance Directives (For Healthcare) Does Patient Have a Medical Advance Directive?: Yes Type of Advance Directive: Healthcare Power of [place]; Living will; Out of facility DNR (pink MOST or yellow form)  Reviewed/Updated  Reviewed/Updated: Reviewed All (Medical, Surgical, Family, Medications, Allergies, Care Teams, Patient Goals)        Objective:    Today's Vitals   02/10/24 1519  Weight: 124 lb (56.2 kg)  Height: 5' 4 (1.626 m)  Body mass index is 21.28 kg/m.  Current Medications (verified) Outpatient Encounter Medications as of 02/10/2024  Medication Sig   anastrozole  (ARIMIDEX ) 1 MG tablet Take 1 tablet by mouth once daily   atorvastatin  (LIPITOR) 20 MG tablet Take 1 tablet (20 mg total) by mouth 3 (three) times a week.   Multiple Vitamin (MULTIVITAMIN) capsule Take 1 capsule by mouth daily.   phytonadione  (VITAMIN K) 5 MG tablet TAKE 1/2 TABLET BY MOUTH AS DIRECTED BY ANTICOAGULATION CLINIC. KEEP THE OTHER HALF IN A SAFE PLACE.   warfarin (COUMADIN ) 2.5 MG tablet TAKE 1 TABLET (2.5 MG) BY MOUTH DAILY EXCEPT TAKE 1  1/2 TABLETS (3.75 MG) ON MONDAY, WEDNESDAY AND FRIDAY OR AS DIRECTED BY ANTICOAGULATION CLINIC   No facility-administered encounter medications on file as of 02/10/2024.   Hearing/Vision screen Hearing Screening - Comments:: Denies hearing difficulties   Vision Screening - Comments:: Wears eyeglasses/UTD/Dr. Cleotilde Immunizations and Health Maintenance Health Maintenance  Topic Date Due   COVID-19 Vaccine (4 - 2025-26 season) 11/17/2023   Zoster Vaccines- Shingrix (1 of 2) 04/23/2024 (Originally 12/20/1964)   DTaP/Tdap/Td (1 - Tdap) 01/21/2025 (Originally 12/20/1964)   Pneumococcal Vaccine: 50+ Years (1 of 2 - PCV) 01/21/2025 (Originally 12/20/1964)   Bone Density Scan  05/19/2024   Mammogram  02/09/2025   Medicare Annual Wellness (AWV)  02/09/2025   Influenza Vaccine  Completed   Hepatitis C Screening  Completed   Meningococcal B Vaccine  Aged Out   Colonoscopy  Discontinued        Assessment/Plan:  This is a routine wellness examination for [facility].  Patient Care Team: Merna Huxley, NP as PCP - General (Family Medicine) Ebbie Cough, MD as Consulting Physician (General Surgery) Loretha Ash, MD as Consulting Physician (Hematology and Oncology) Dewey Rush, MD as Consulting Physician (Radiation Oncology) Cleotilde Sewer, OD (Optometry)  I have personally reviewed and noted the following in the patient's chart:   Medical and social history Use of alcohol, tobacco or illicit drugs  Current medications and supplements including opioid prescriptions. Functional ability and status Nutritional status Physical activity Advanced directives List of other physicians Hospitalizations, surgeries, and ER visits in previous 12 months Vitals Screenings to include cognitive, depression, and falls Referrals and appointments  No orders of the defined types were placed in this encounter.  In addition, I have reviewed and discussed with patient certain preventive protocols, quality  metrics, and best practice recommendations. A written personalized care plan for preventive services as well as general preventive health recommendations were provided to patient.   Smaran Gaus L Kadan Millstein, CMA   02/10/2024  Return in 1 year (on 02/09/2025).  After Visit Summary: (MyChart) Due to this being a telephonic visit, the after visit summary with patients personalized plan was offered to patient via MyChart   Nurse Notes: Patient declines any due vaccines.  Patient is up to date on all other health maintenance with no concerns to address today.

## 2024-02-23 ENCOUNTER — Encounter: Payer: Self-pay | Admitting: Hematology and Oncology

## 2024-03-03 ENCOUNTER — Ambulatory Visit

## 2024-03-03 DIAGNOSIS — Z7901 Long term (current) use of anticoagulants: Secondary | ICD-10-CM

## 2024-03-03 LAB — POCT INR: INR: 2.6 (ref 2.0–3.0)

## 2024-03-03 NOTE — Progress Notes (Signed)
 I have reviewed and agree with note, evaluation, plan.   Garnette Lukes, MD

## 2024-03-03 NOTE — Patient Instructions (Addendum)
 Pre visit review using our clinic review tool, if applicable. No additional management support is needed unless otherwise documented below in the visit note.  Continue 3.75 mg daily except take 2.5 mg on Tuesday, Thursday and Saturday. Recheck in 3 weeks at [facility].

## 2024-03-03 NOTE — Progress Notes (Signed)
 Indication: DVT 04/2022, APS Continue 3.75 mg daily except take 2.5 mg on Tuesday, Thursday and Saturday. Recheck in 3 weeks.

## 2024-03-18 ENCOUNTER — Encounter: Payer: Self-pay | Admitting: Hematology and Oncology

## 2024-03-22 ENCOUNTER — Encounter: Payer: Self-pay | Admitting: Hematology and Oncology

## 2024-03-22 ENCOUNTER — Ambulatory Visit (INDEPENDENT_AMBULATORY_CARE_PROVIDER_SITE_OTHER): Payer: Self-pay

## 2024-03-22 DIAGNOSIS — Z7901 Long term (current) use of anticoagulants: Secondary | ICD-10-CM

## 2024-03-22 LAB — POCT INR: INR: 2.2 (ref 2.0–3.0)

## 2024-03-22 NOTE — Progress Notes (Signed)
 Indication: DVT 04/2022, APS Continue 3.75 mg daily except take 2.5 mg on Tuesday, Thursday and Saturday. Recheck in 4 weeks per pt request due to being out of town.

## 2024-03-22 NOTE — Patient Instructions (Addendum)
 Pre visit review using our clinic review tool, if applicable. No additional management support is needed unless otherwise documented below in the visit note.  Continue 3.75 mg daily except take 2.5 mg on Tuesday, Thursday and Saturday. Recheck in 4 weeks.

## 2024-04-13 ENCOUNTER — Other Ambulatory Visit: Payer: Self-pay | Admitting: Hematology and Oncology

## 2024-04-19 ENCOUNTER — Ambulatory Visit

## 2024-04-19 ENCOUNTER — Telehealth: Payer: Self-pay

## 2024-04-19 NOTE — Telephone Encounter (Signed)
 Pt called to RS her coumadin  clinic apt today due to weather. Clinic is closed today.  RS apt for Wednesday, 2/4. Pt will follow current dosing.

## 2024-04-21 ENCOUNTER — Ambulatory Visit

## 2024-04-21 DIAGNOSIS — Z7901 Long term (current) use of anticoagulants: Secondary | ICD-10-CM

## 2024-04-21 LAB — POCT INR: INR: 2.4 (ref 2.0–3.0)

## 2024-04-21 NOTE — Patient Instructions (Addendum)
 Pre visit review using our clinic review tool, if applicable. No additional management support is needed unless otherwise documented below in the visit note.  Continue 3.75 mg daily except take 2.5 mg on Tuesday, Thursday and Saturday. Recheck in 1 week.

## 2024-04-21 NOTE — Progress Notes (Signed)
 Indication: DVT 04/2022, APS Pt is going to start a magnesium supplement for nightly leg pain. To assess for any interactions will recheck INR in 1 week.  Continue 3.75 mg daily except take 2.5 mg on Tuesday, Thursday and Saturday. Recheck in 1 week.

## 2024-04-28 ENCOUNTER — Ambulatory Visit

## 2024-06-04 ENCOUNTER — Ambulatory Visit: Admitting: Hematology and Oncology

## 2024-06-07 ENCOUNTER — Ambulatory Visit

## 2024-06-10 ENCOUNTER — Ambulatory Visit: Admitting: Hematology and Oncology
